# Patient Record
Sex: Male | Born: 1955
Health system: Southern US, Community
[De-identification: ages and names within clinical notes are randomized; demographics above are authoritative.]

## PROBLEM LIST (undated history)

## (undated) DIAGNOSIS — E039 Hypothyroidism, unspecified: Secondary | ICD-10-CM

## (undated) DIAGNOSIS — E785 Hyperlipidemia, unspecified: Secondary | ICD-10-CM

## (undated) DIAGNOSIS — I2510 Atherosclerotic heart disease of native coronary artery without angina pectoris: Secondary | ICD-10-CM

## (undated) DIAGNOSIS — C801 Malignant (primary) neoplasm, unspecified: Secondary | ICD-10-CM

## (undated) DIAGNOSIS — M1990 Unspecified osteoarthritis, unspecified site: Secondary | ICD-10-CM

## (undated) HISTORY — PX: OTHER SURGICAL HISTORY: SHX169

## (undated) HISTORY — DX: Hyperlipidemia, unspecified: E78.5

---

## 2013-07-16 ENCOUNTER — Encounter (HOSPITAL_COMMUNITY): Payer: Self-pay | Admitting: Emergency Medicine

## 2013-07-16 ENCOUNTER — Emergency Department (HOSPITAL_COMMUNITY): Payer: BC Managed Care – PPO

## 2013-07-16 ENCOUNTER — Emergency Department (HOSPITAL_COMMUNITY)
Admission: EM | Admit: 2013-07-16 | Discharge: 2013-07-16 | Disposition: A | Discharge status: Home / Self Care | Payer: BC Managed Care – PPO | Attending: Emergency Medicine | Admitting: Emergency Medicine

## 2013-07-16 DIAGNOSIS — F17200 Nicotine dependence, unspecified, uncomplicated: Secondary | ICD-10-CM | POA: Insufficient documentation

## 2013-07-16 DIAGNOSIS — G454 Transient global amnesia: Secondary | ICD-10-CM

## 2013-07-16 DIAGNOSIS — Z7982 Long term (current) use of aspirin: Secondary | ICD-10-CM | POA: Insufficient documentation

## 2013-07-16 LAB — COMPREHENSIVE METABOLIC PANEL
ALK PHOS: 105 U/L (ref 39–117)
ALT: 52 U/L (ref 0–53)
AST: 35 U/L (ref 0–37)
Albumin: 4.6 g/dL (ref 3.5–5.2)
BILIRUBIN TOTAL: 0.4 mg/dL (ref 0.3–1.2)
BUN: 11 mg/dL (ref 6–23)
CHLORIDE: 98 meq/L (ref 96–112)
CO2: 22 meq/L (ref 19–32)
Calcium: 9.6 mg/dL (ref 8.4–10.5)
Creatinine, Ser: 0.67 mg/dL (ref 0.50–1.35)
GLUCOSE: 104 mg/dL — AB (ref 70–99)
POTASSIUM: 4 meq/L (ref 3.7–5.3)
SODIUM: 136 meq/L — AB (ref 137–147)
Total Protein: 8.1 g/dL (ref 6.0–8.3)

## 2013-07-16 LAB — CBC WITH DIFFERENTIAL/PLATELET
BASOS ABS: 0 10*3/uL (ref 0.0–0.1)
Basophils Relative: 0 % (ref 0–1)
Eosinophils Absolute: 0 10*3/uL (ref 0.0–0.7)
Eosinophils Relative: 0 % (ref 0–5)
HCT: 41.7 % (ref 39.0–52.0)
Hemoglobin: 14 g/dL (ref 13.0–17.0)
LYMPHS PCT: 17 % (ref 12–46)
Lymphs Abs: 2.3 10*3/uL (ref 0.7–4.0)
MCH: 29 pg (ref 26.0–34.0)
MCHC: 33.6 g/dL (ref 30.0–36.0)
MCV: 86.3 fL (ref 78.0–100.0)
MONO ABS: 0.9 10*3/uL (ref 0.1–1.0)
Monocytes Relative: 7 % (ref 3–12)
NEUTROS ABS: 10.3 10*3/uL — AB (ref 1.7–7.7)
Neutrophils Relative %: 76 % (ref 43–77)
PLATELETS: 310 10*3/uL (ref 150–400)
RBC: 4.83 MIL/uL (ref 4.22–5.81)
RDW: 14.1 % (ref 11.5–15.5)
WBC: 13.6 10*3/uL — AB (ref 4.0–10.5)

## 2013-07-16 NOTE — ED Provider Notes (Signed)
CSN: [id number]     Arrival date & time 07/16/13  1954 History   First MD Initiated Contact with Patient 07/16/13 2106     Chief Complaint  Patient presents with  . Memory Loss      HPI Family reports that pt experienced loss of memory that started at 5pm.  This sudden loss of memory occurred immediately following sexual intercourse.  There was no seizure activity or urinary incontinence. Family denies any slurred speech, weakness, or balance deficits. Pt alert and oriented to person, place and situation with a slight deviation in not recalling the year. Pt denies headache, SOB or chest pain. Marland Kitchen       History reviewed. No pertinent past medical history. History reviewed. No pertinent past surgical history. History reviewed. No pertinent family history. History  Substance Use Topics  . Smoking status: Current Some Day Smoker  . Smokeless tobacco: Current User  . Alcohol Use: No    Review of Systems  All other systems reviewed and are negative  Allergies  Review of patient's allergies indicates no known allergies.  Home Medications   Prior to Admission medications   Medication Sig Start Date End Date Taking? Authorizing Provider  aspirin 325 MG tablet Take 325 mg by mouth once.   Yes Historical Provider, MD   BP 139/83  Pulse 109  Temp(Src) 99.2 F (37.3 C) (Oral)  Resp 18  Ht 5\' 10"  (1.778 m)  Wt 236 lb 3.2 oz (107.14 kg)  BMI 33.89 kg/m2  SpO2 100% Physical Exam  Nursing note and vitals reviewed. Constitutional: He is oriented to person, place, and time. He appears well-developed and well-nourished. No distress.  HENT:  Head: Normocephalic and atraumatic.  Eyes: Pupils are equal, round, and reactive to light.  Neck: Normal range of motion.  Cardiovascular: Normal rate and intact distal pulses.   Pulmonary/Chest: No respiratory distress.  Abdominal: Normal appearance. He exhibits no distension.  Musculoskeletal: Normal range of motion.  Neurological: He is  alert and oriented to person, place, and time. He has normal strength. No cranial nerve deficit or sensory deficit. Coordination abnormal. GCS eye subscore is 4. GCS motor subscore is 6.  Patient methodically repeats statements and questions with identical gestures.  Skin: Skin is warm and dry. No rash noted.  Psychiatric: He has a normal mood and affect. His behavior is normal.    ED Course  Procedures (including critical care time) Labs Review Labs Reviewed  COMPREHENSIVE METABOLIC PANEL - Abnormal; Notable for the following:    Sodium 136 (*)    Glucose, Bld 104 (*)    All other components within normal limits  CBC WITH DIFFERENTIAL - Abnormal; Notable for the following:    WBC 13.6 (*)    Neutro Abs 10.3 (*)    All other components within normal limits    Imaging Review No results found.    MDM   Final diagnoses:  TGA (transient global amnesia)       Dot Lanes, MD 07/23/13 [phone]

## 2013-07-16 NOTE — ED Notes (Signed)
Family reports that pt experienced loss of memory that started at [place] denies any slurred speech, weakness, or balance deficits. Pt alert and oriented to person, place and situation with a slight deviation in not recalling the year. Pt denies headache, SOB or chest pain. Family reports a new e cigarette use for pt.

## 2013-07-16 NOTE — ED Notes (Signed)
MD at bedside. Dr. Audie Pinto at bedside.

## 2013-07-16 NOTE — ED Notes (Signed)
Pt is alert to person, place and self, but is unsure of what year it is or what day it is.

## 2013-07-16 NOTE — Discharge Instructions (Signed)
Transient Global Amnesia Your exam shows you may have a rare problem that causes temporary amnesia, an inability to remember what has happened in the past several hours or day. Transient global amnesia (TGA) means you cannot remember recent events, even though you may look and act normally. There are no physical problems in TGA; your vision, strength, coordination, and sensations are all normal. There is usually a complete return to normal memory capacity after an episode is over. Avoid alcohol or any sedating medicines until you are completely recovered. Call your doctor right away if your memory is not fully recovered after 24 hours, or if you have any other serious problems including:  Severe headache, nausea, vomiting, fever, or other symptoms of an infection.  Weakness, numbness, difficulty with movement, or incoordination.  Blurred or double vision, unusual sleepiness, seizures, or fainting. Document Released: 04/26/2004 Document Revised: 06/11/2011 Document Reviewed: 03/19/2005 [facility] Patient Information 2014 [place].

## 2018-04-10 DIAGNOSIS — Z0000 Encounter for general adult medical examination without abnormal findings: Secondary | ICD-10-CM | POA: Diagnosis not present

## 2018-04-10 DIAGNOSIS — Z23 Encounter for immunization: Secondary | ICD-10-CM | POA: Diagnosis not present

## 2018-04-10 DIAGNOSIS — Z125 Encounter for screening for malignant neoplasm of prostate: Secondary | ICD-10-CM | POA: Diagnosis not present

## 2018-04-10 DIAGNOSIS — Z13220 Encounter for screening for lipoid disorders: Secondary | ICD-10-CM | POA: Diagnosis not present

## 2018-04-10 DIAGNOSIS — Z720 Tobacco use: Secondary | ICD-10-CM | POA: Diagnosis not present

## 2018-04-16 ENCOUNTER — Ambulatory Visit (INDEPENDENT_AMBULATORY_CARE_PROVIDER_SITE_OTHER): Payer: 59 | Admitting: Orthopaedic Surgery

## 2018-04-16 ENCOUNTER — Ambulatory Visit (INDEPENDENT_AMBULATORY_CARE_PROVIDER_SITE_OTHER): Payer: Self-pay

## 2018-04-16 ENCOUNTER — Encounter (INDEPENDENT_AMBULATORY_CARE_PROVIDER_SITE_OTHER): Payer: Self-pay | Admitting: Orthopaedic Surgery

## 2018-04-16 DIAGNOSIS — M25552 Pain in left hip: Secondary | ICD-10-CM

## 2018-04-16 NOTE — Progress Notes (Signed)
+   Office Visit Note   Patient: Marc [name]           Date of Birth: 1955/12/04           MRN: [id number] Visit Date: 04/16/2018              Requested by: [name], PA-C [address] [place], [place] [address] PCP: System, Pcp Not In   Assessment & Plan: Visit Diagnoses:  1. Pain in left hip     Plan: This certainly seems to be a hip issue.  I would like to send him to Dr. Ernestina Patches for an intra-articular steroid injection of the left hip to see how this helps from a symptomatic standpoint.  We will also send him to physical therapy to work on stretching of the hip and strengthening to hopefully improve his mobility and his range of motion.  I will see him back in 4 weeks to see how this is done for him.  All question concerns were answered and addressed.  Follow-Up Instructions: Return in about 4 weeks (around 05/14/2018).   Orders:  Orders Placed This Encounter  Procedures  . XR HIP UNILAT W OR W/O PELVIS 1V LEFT   No orders of the defined types were placed in this encounter.     Procedures: No procedures performed   Clinical Data: No additional findings.   Subjective: Chief Complaint  Patient presents with  . Left Hip - Pain  The patient is a very pleasant and active 63 year old gentleman who is been having left hip and groin pain since the summer of this past year.  He denies any injury.  His left hip is gotten significantly stiff and it is difficult with him crossing his left leg over his right and putting his shoes and socks on the left side.  He denies any sciatic symptoms or any back issues.  He denies any numbness and tingling down his legs.  He denies any knee issues.  He denies any recent injuries.  It has been definitely affecting his mobility and his quality of life.  HPI  Review of Systems He currently denies any headache, chest pain, shortness of breath, fever, chills, nausea, vomiting.  Objective: Vital Signs: There were no vitals  taken for this visit.  Physical Exam He is alert and orient x3 and in no acute distress Ortho Exam Examination of his right hip is entirely normal examination of his left hip shows significant stiffness with internal and external rotation.  There is pain on rotation in the groin as well on the left hip.  There is no pain to palpation of the trochanteric area.  He has a negative straight leg raise.  He has excellent strength in his bilateral lower extremities.  He is actually slightly longer minimally but is longer on his left leg than his right side. Specialty Comments:  No specialty comments available.  Imaging: Xr Hip Unilat W Or W/o Pelvis 1v Left  Result Date: 04/16/2018 An AP pelvis and lateral left hip shows para-articular osteophytes and slight joint space narrowing as well as sclerotic changes.  This is mainly in the femoral head.    PMFS History: There are no active problems to display for this patient.  History reviewed. No pertinent past medical history.  History reviewed. No pertinent family history.  History reviewed. No pertinent surgical history. Social History   Occupational History  . Not on file  Tobacco Use  . Smoking status: Current Some  Day Smoker  . Smokeless tobacco: Current User  Substance and Sexual Activity  . Alcohol use: No  . Drug use: No  . Sexual activity: Not on file

## 2018-04-17 ENCOUNTER — Other Ambulatory Visit (INDEPENDENT_AMBULATORY_CARE_PROVIDER_SITE_OTHER): Payer: Self-pay

## 2018-04-17 DIAGNOSIS — M25552 Pain in left hip: Secondary | ICD-10-CM

## 2018-04-29 ENCOUNTER — Ambulatory Visit (INDEPENDENT_AMBULATORY_CARE_PROVIDER_SITE_OTHER): Payer: 59 | Admitting: Physical Medicine and Rehabilitation

## 2018-04-29 ENCOUNTER — Encounter (INDEPENDENT_AMBULATORY_CARE_PROVIDER_SITE_OTHER): Payer: Self-pay | Admitting: Physical Medicine and Rehabilitation

## 2018-04-29 ENCOUNTER — Ambulatory Visit (INDEPENDENT_AMBULATORY_CARE_PROVIDER_SITE_OTHER): Payer: Self-pay

## 2018-04-29 DIAGNOSIS — M25552 Pain in left hip: Secondary | ICD-10-CM | POA: Diagnosis not present

## 2018-04-29 NOTE — Progress Notes (Signed)
 .  Numeric Pain Rating Scale and Functional Assessment Average Pain 7   In the last MONTH (on 0-10 scale) has pain interfered with the following?  1. General activity like being  able to carry out your everyday physical activities such as walking, climbing stairs, carrying groceries, or moving a chair?  Rating(6)   -Dye Allergies.  

## 2018-04-29 NOTE — Patient Instructions (Signed)

## 2018-04-29 NOTE — Progress Notes (Signed)
   Marc [name] - 63 y.o. male MRN [id number]  Date of birth: 01/02/1956  Office Visit Note: Visit Date: 04/29/2018 PCP: System, Pcp Not In Referred by: Mcarthur Rossetti*  Subjective: No chief complaint on file.  HPI:  Marc [name] is a 63 y.o. male who comes in today At the request of Dr. Jean Rosenthal for diagnostic note for therapeutic anesthetic hip arthrogram on the left.  Patient is having left hip and groin pain started 6 to 8 months ago worse with walking activity.  Average pain is 7 out of 10.  ROS Otherwise per HPI.  Assessment & Plan: Visit Diagnoses:  1. Pain in left hip     Plan: Findings:  Diagnostic and therapeutic anesthetic hip arthrogram on the left.  Patient did have relief during the anesthetic phase.    Meds & Orders: No orders of the defined types were placed in this encounter.   Orders Placed This Encounter  Procedures  . Large Joint Inj: L hip joint  . XR C-ARM NO REPORT    Follow-up: Return for Jean Rosenthal, MD.   Procedures: Large Joint Inj: L hip joint on 04/29/2018 8:32 AM Indications: pain and diagnostic evaluation Details: 22 G needle, anterior approach  Arthrogram: Yes  Medications: 80 mg triamcinolone acetonide 40 MG/ML; 3 mL bupivacaine 0.5 % Outcome: tolerated well, no immediate complications  Arthrogram demonstrated excellent flow of contrast throughout the joint surface without extravasation or obvious defect.  The patient had relief of symptoms during the anesthetic phase of the injection.  Procedure, treatment alternatives, risks and benefits explained, specific risks discussed. Consent was given by the patient. Immediately prior to procedure a time out was called to verify the correct patient, procedure, equipment, support staff and site/side marked as required. Patient was prepped and draped in the usual sterile fashion.      No notes on file   Clinical History: No specialty comments available.      Objective:  VS:  HT:    WT:   BMI:     BP:   HR: bpm  TEMP: ( )  RESP:  Physical Exam  Ortho Exam Imaging: Xr C-arm No Report  Result Date: 04/29/2018 Please see Notes tab for imaging impression.

## 2018-04-30 MED ORDER — TRIAMCINOLONE ACETONIDE 40 MG/ML IJ SUSP
80.0000 mg | INTRAMUSCULAR | Status: AC | PRN
  Administered 2018-04-29: 80 mg via INTRA_ARTICULAR

## 2018-04-30 MED ORDER — BUPIVACAINE HCL 0.5 % IJ SOLN
3.0000 mL | INTRAMUSCULAR | Status: AC | PRN
  Administered 2018-04-29: 3 mL via INTRA_ARTICULAR

## 2018-05-05 ENCOUNTER — Encounter: Payer: Self-pay | Admitting: Physical Therapy

## 2018-05-05 ENCOUNTER — Ambulatory Visit: Payer: 59 | Attending: Orthopaedic Surgery | Admitting: Physical Therapy

## 2018-05-05 ENCOUNTER — Other Ambulatory Visit: Payer: Self-pay

## 2018-05-05 DIAGNOSIS — M25552 Pain in left hip: Secondary | ICD-10-CM | POA: Insufficient documentation

## 2018-05-05 DIAGNOSIS — R262 Difficulty in walking, not elsewhere classified: Secondary | ICD-10-CM | POA: Diagnosis not present

## 2018-05-05 DIAGNOSIS — M25652 Stiffness of left hip, not elsewhere classified: Secondary | ICD-10-CM | POA: Diagnosis not present

## 2018-05-05 NOTE — Therapy (Signed)
[place], [place], [address] Phone: [phone]   Fax:  [phone]  Physical Therapy Evaluation  Patient Details  Name: Marc [name] MRN: [id number] Date of Birth: Dec 17, 1955 Referring Provider (PT): Jean Rosenthal, MD   Encounter Date: 05/05/2018  PT End of Session - 05/05/18 1225    Visit Number  1    Number of Visits  12    Date for PT Re-Evaluation  06/16/18    Authorization Type  [employer]-23 visit limit    PT Start Time  1059    PT Stop Time  1146    PT Time Calculation (min)  47 min    Activity Tolerance  Patient tolerated treatment well    Behavior During Therapy  [facility] for tasks assessed/performed       Past Medical History:  Diagnosis Date  . Hyperlipidemia     Past Surgical History:  Procedure Laterality Date  . right shoulder rotator cuff repair Right     There were no vitals filed for this visit.   Subjective Assessment - 05/05/18 1103    Subjective  Pt. is a 63 y/o male referred to PT with c/o left hip pain and stiffness. Pt. reports onset early last Summer. No mechanism of injury noted. He reports in the past had had issues in the Summer with muscles "tightening up" in hip and calf which he attributed to deydration but reports this time hip pain and tightness persisted. Pt. had X-rays which per MD notes showed mild joint space narrowing as well as osteophytes, sclerotic changes. Pt. had therapeutc anesthetic left hip arthrogram 04/29/18 which hew reports has helped with pain. Of note at MD visit pt. was also noted to have leg length discrepancy with left LE>right but reports he feels like right LE is longer.    Pertinent History  possible leg length discrepancy, no other significant related history    Limitations  Standing;Walking;House hold activities   walking uphill is worst   How long can you sit comfortably?  no limitations    How long can you stand comfortably?  no limitations     How long can you walk comfortably?  varies, reports at PLOF able up to an hour but has not attempted given pain    Diagnostic tests  X-rays    Patient Stated Goals  Improve flexibility in hip    Currently in Pain?  Yes    Pain Score  2     Pain Location  Hip    Pain Orientation  Left;Anterior   left groin and anterior hip   Pain Descriptors / Indicators  Constant    Pain Type  Chronic pain    Pain Radiating Towards  groin, left anterior hip    Pain Onset  More than a month ago    Pain Frequency  Constant    Aggravating Factors   walking (especially uphill) and standing    Pain Relieving Factors  medication, recent injection    Effect of Pain on Daily Activities  Limits tolerance community mobility, prolonged ambulation and standing         [facility] PT Assessment - 05/05/18 0001      Assessment   Medical Diagnosis  Left hip pain    Referring Provider (PT)  Jean Rosenthal, MD    Onset Date/Surgical Date  08/31/17    Prior Therapy  none      Precautions   Precautions  None  Restrictions   Weight Bearing Restrictions  No      Balance Screen   Has the patient fallen in the past 6 months  No      [place] residence    Living Arrangements  Spouse/significant other    Type of [place] Access  --   3-4 steps to enter, steps inside to basement and attic     Prior Function   Level of Independence  Independent with basic ADLs;Independent with community mobility without device      Cognition   Overall Cognitive Status  Within Functional Limits for tasks assessed      Observation/Other Assessments   Observations  --   LLD with left LE 2 cm > right   Focus on Therapeutic Outcomes (FOTO)   42% Limited      ROM / Strength   AROM / PROM / Strength  AROM;PROM;Strength      AROM   AROM Assessment Site  Hip    Right/Left Hip  Right;Left    Right Hip Flexion  105    Right Hip External Rotation   40    Right Hip Internal  Rotation   25    Right Hip ABduction  45    Right Hip ADduction  --   WFL   Left Hip Flexion  95    Left Hip External Rotation   38    Left Hip Internal Rotation   20    Left Hip ABduction  40    Left Hip ADduction  --   WFL     PROM   Overall PROM Comments  --   capsular tightness at end-ranges left hip PROM   PROM Assessment Site  Hip    Right/Left Hip  Left    Left Hip Flexion  100    Left Hip External Rotation   40    Left Hip Internal Rotation   20    Left Hip ABduction  40      Strength   Strength Assessment Site  Hip;Knee    Right/Left Hip  Right;Left    Right Hip Flexion  5/5    Right Hip Extension  5/5    Right Hip External Rotation   5/5    Right Hip Internal Rotation  5/5    Right Hip ABduction  5/5    Left Hip Flexion  4+/5    Left Hip Extension  4+/5    Left Hip External Rotation  4+/5    Left Hip Internal Rotation  4+/5    Left Hip ABduction  5/5    Right/Left Knee  Right;Left    Right Knee Flexion  5/5    Right Knee Extension  5/5    Left Knee Flexion  5/5    Left Knee Extension  5/5      Flexibility   Soft Tissue Assessment /Muscle Length  --   tight adductors,hip flexors,hamstrings SLR 70 deg,piriformis     Palpation   Palpation comment  possible left anterior innominate rotation      Special Tests   Other special tests  Scour (-), No pain with FABER but limited ability due to stiffness, muscle tightness in adductors, no significant change in LLD with [place] + longsitting                Objective measurements completed on examination: See above findings.      [facility]  Adult PT Treatment/Exercise - 05/05/18 0001      Exercises   Exercises  Knee/Hip      Knee/Hip Exercises: Stretches   Hip Flexor Stretch  --   instructed HEP supine stretch   Piriformis Stretch Limitations  attempted for HEP but difficulty feeling stretch, more note of groin tightness per subjective report, limited ability to stretch at eval due to hip stiffness     Other Knee/Hip Stretches  instructed supine adductor stretch      Knee/Hip Exercises: Supine   Straight Leg Raises  AROM;Strengthening;Left;5 reps    Straight Leg Raises Limitations  brief practice for HEP    Other Supine Knee/Hip Exercises  clamshell green band x 10      Manual Therapy   Manual Therapy  Joint mobilization;Muscle Energy Technique    Joint Mobilization  Left hip mobilization at 90 deg flexion for increased ER, IR, flexion with use belt grade I-IV, left hip long axis distraction grade I-IV    Muscle Energy Technique  Left hamstring/right hip flexor contraction 2 sets of 5 for 5 sec holds for left anterior innominate rotation             PT Education - 05/05/18 1224    Education Details  HEP, POC, potential symptom etiology    Person(s) Educated  Patient    Methods  Explanation;Demonstration;Verbal cues;Handout    Comprehension  Verbalized understanding;Returned demonstration;Verbal cues required          PT Long Term Goals - 05/05/18 1245      PT LONG TERM GOAL #1   Title  Independent with HEP    Baseline  no HEP    Time  6    Period  Weeks    Status  New    Target Date  06/16/18      PT LONG TERM GOAL #2   Title  Improve FOTO score to 32% or less impairment    Baseline  42% limited    Time  6    Period  Weeks    Status  New    Target Date  06/16/18      PT LONG TERM GOAL #3   Title  Increase left hip AROM 10 deg or greater to improve ability to donn shoes and perform transfers from low seating    Baseline  95 deg    Time  6    Period  Weeks    Status  New    Target Date  06/16/18      PT LONG TERM GOAL #4   Title  Left hip strength grossly 5/5 to improve stability for prolonged ambulation/gait over uneven surfaces and squatting motions for IADLs    Baseline  see flowsheet-grossly 4+/5    Time  6    Period  Weeks    Status  New    Target Date  06/16/18             Plan - 05/05/18 1226    Clinical Impression Statement  Pt.  presents with left hip pain and decreased ROM, muscle weakness with capsular restriction of ROM along with muscle tightness. Pt. also presents with left length discrepancy (left>right) which unclear if contributing to symptoms, also possible associated innominate rotation. Pt. would benefit from PT to address current associated functional limitations for mobility.    History and Personal Factors relevant to plan of care:  duration of symptoms, etiology somewhat unclear given degenerative changes mild per imaging report  Clinical Presentation  Stable    Clinical Decision Making  Low    Rehab Potential  Good    Clinical Impairments Affecting Rehab Potential  expect good potential to improve hip mobility, flexibility and strength    PT Frequency  2x / week    PT Duration  6 weeks    PT Treatment/Interventions  Dry needling;Manual techniques;Joint Manipulations;Taping;ADLs/Self Care Home Management;Cryotherapy;Ultrasound;Moist Heat;Iontophoresis 4mg /ml Dexamethasone;Electrical Stimulation;Therapeutic activities;Therapeutic exercise;Neuromuscular re-education;Patient/family education;Passive range of motion;Gait training    PT Next Visit Plan  NUSTEP warm up, joint mobilization left hip use belt as needed, roller use left adductors, piriformis, left hip stretches-adductors, hip flexors, try piriformis (limited ability to stretch at eval due to hip stiffness and groin tightness), hip strengthening as tolerated, possible METs for innominate rotation if needed, discuss lift for shoe for LLD as needed    PT Home Exercise Plan  stretches: adductors and hip flexors (will add piriformis/external rotators as able), clamshells, SLR    Consulted and Agree with Plan of Care  Patient       Patient will benefit from skilled therapeutic intervention in order to improve the following deficits and impairments:  Pain, Hypomobility, Decreased strength, Decreased range of motion, Decreased activity tolerance, Impaired  flexibility, Difficulty walking  Visit Diagnosis: Pain in left hip  Stiffness of left hip, not elsewhere classified  Difficulty in walking, not elsewhere classified     Problem List There are no active problems to display for this patient.   Beaulah Dinning, PT, DPT 05/05/18 1:00 PM  [facility] [address] [place], [place], [address] Phone: [phone]   Fax:  [phone]  Name: Marc [name] MRN: [id number] Date of Birth: 1956-01-28

## 2018-05-13 ENCOUNTER — Encounter: Payer: Self-pay | Admitting: Physical Therapy

## 2018-05-13 ENCOUNTER — Ambulatory Visit: Payer: 59 | Admitting: Physical Therapy

## 2018-05-13 DIAGNOSIS — R262 Difficulty in walking, not elsewhere classified: Secondary | ICD-10-CM

## 2018-05-13 DIAGNOSIS — M25552 Pain in left hip: Secondary | ICD-10-CM | POA: Diagnosis not present

## 2018-05-13 DIAGNOSIS — M25652 Stiffness of left hip, not elsewhere classified: Secondary | ICD-10-CM

## 2018-05-13 NOTE — Therapy (Signed)
[place], [place], [address] Phone: [phone]   Fax:  [phone]  Physical Therapy Treatment  Patient Details  Name: Marc [name] MRN: [id number] Date of Birth: [birth year] Referring Provider (PT): Jean Rosenthal, MD   Encounter Date: 05/13/2018  PT End of Session - 05/13/18 1215    Visit Number  2    Number of Visits  12    Date for PT Re-Evaluation  06/16/18    Authorization Type  UHC-23 visit limit    PT Start Time  1017    PT Stop Time  1055    PT Time Calculation (min)  38 min    Activity Tolerance  Patient tolerated treatment well    Behavior During Therapy  [facility] for tasks assessed/performed       Past Medical History:  Diagnosis Date  . Hyperlipidemia     Past Surgical History:  Procedure Laterality Date  . right shoulder rotator cuff repair Right     There were no vitals filed for this visit.  Subjective Assessment - 05/13/18 1024    Subjective  "Hanging the leg off the bed really helped a lot with my hip pain and all of the exercises are helping. I got a shot that helped a lot also."    Currently in Pain?  No/denies                       [facility] Adult PT Treatment/Exercise - 05/13/18 0001      Knee/Hip Exercises: Stretches   Active Hamstring Stretch  2 reps;30 seconds    Quad Stretch  Left;2 reps;30 seconds    Piriformis Stretch  Left;2 reps;30 seconds    Piriformis Stretch Limitations  Pt noted pain along anterior aspect of hip near the groin    Other Knee/Hip Stretches  Adductor stretch x2; 30 sec      Knee/Hip Exercises: Aerobic   Nustep  L4 5 min      Knee/Hip Exercises: Standing   Other Standing Knee Exercises  3-way hip x10 each side   Cues for slow movement with control and upright posture     Knee/Hip Exercises: Seated   Long Arc Quad  Strengthening;Left;2 sets;10 reps   Strengthen quads    Long Arc Quad Limitations  Cues for eccentric control       Knee/Hip Exercises: Supine   Other Supine Knee/Hip Exercises  clamshell green band 2 x 10    Other Supine Knee/Hip Exercises  ball squeezes x10; hold 3-5 secs      Manual Therapy   Manual Therapy  Joint mobilization;Muscle Energy Technique    Joint Mobilization  Distraction x3 30 sec holds; posterolateral glide to stretch posterior capsule    Muscle Energy Technique  Left hamstring/right hip flexor contraction 2 sets of 5 for 5 sec holds for left anterior innominate rotation             PT Education - 05/13/18 1215    Education Details  HEP    Person(s) Educated  Patient    Methods  Explanation;Demonstration;Verbal cues    Comprehension  Verbalized understanding;Returned demonstration          PT Long Term Goals - 05/05/18 1245      PT LONG TERM GOAL #1   Title  Independent with HEP    Baseline  no HEP    Time  6    Period  Weeks    Status  New    Target Date  06/16/18      PT LONG TERM GOAL #2   Title  Improve FOTO score to 32% or less impairment    Baseline  42% limited    Time  6    Period  Weeks    Status  New    Target Date  06/16/18      PT LONG TERM GOAL #3   Title  Increase left hip AROM 10 deg or greater to improve ability to donn shoes and perform transfers from low seating    Baseline  95 deg    Time  6    Period  Weeks    Status  New    Target Date  06/16/18      PT LONG TERM GOAL #4   Title  Left hip strength grossly 5/5 to improve stability for prolonged ambulation/gait over uneven surfaces and squatting motions for IADLs    Baseline  see flowsheet-grossly 4+/5    Time  6    Period  Weeks    Status  New    Target Date  06/16/18     During this treatment session, the therapist was present, participating in and directing the treatment.       Plan - 05/13/18 1345    Clinical Impression Statement  Pt reports feeling much better after doing his stretches and exercises at home. Updated HEP with standing hip 3-way. Performed joint  mobilizations to improve ROM and decrease pain in anterior hip. Pt required cues for slow eccentric control during ther ex, but demos good recall of HEP.     PT Next Visit Plan  NUSTEP warm up, joint mobilization left hip use belt as needed, roller use left adductors, discuss lift for shoe for LLD as needed, assess if hips are level and focus on ROM    PT Home Exercise Plan  stretches: adductors and hip flexors (will add piriformis/external rotators as able), clamshells, SLR,        Patient will benefit from skilled therapeutic intervention in order to improve the following deficits and impairments:  Pain, Hypomobility, Decreased strength, Decreased range of motion, Decreased activity tolerance, Impaired flexibility, Difficulty walking  Visit Diagnosis: Pain in left hip  Stiffness of left hip, not elsewhere classified  Difficulty in walking, not elsewhere classified     Problem List There are no active problems to display for this patient.   Fuller Mandril, SPTA 05/13/2018, 2:49 PM   Hessie Diener, PTA 05/13/18 2:49 PM Phone: [phone] Fax: [facility] [address] [place], [place], [address] Phone: [phone]   Fax:  [phone]  Name: Marc [name] MRN: [id number] Date of Birth: June 02, 1955

## 2018-05-14 ENCOUNTER — Encounter (INDEPENDENT_AMBULATORY_CARE_PROVIDER_SITE_OTHER): Payer: Self-pay | Admitting: Orthopaedic Surgery

## 2018-05-14 ENCOUNTER — Ambulatory Visit (INDEPENDENT_AMBULATORY_CARE_PROVIDER_SITE_OTHER): Payer: 59 | Admitting: Orthopaedic Surgery

## 2018-05-14 DIAGNOSIS — M25552 Pain in left hip: Secondary | ICD-10-CM | POA: Diagnosis not present

## 2018-05-14 NOTE — Progress Notes (Signed)
HPI: Marc [name] returns today follow-up of his left hip.  He is status post intra-articular injection by Dr. Ernestina Patches on 04/29/2018.  He states the injection was very helpful.  He is 95% better.  More his stiffness and tightness in the hip at this point.  He states the pain is having now he can definitely live with.  Physical exam: General: Well-developed well-nourished male no acute distress mood affect appropriate  Bilateral hips: He has limited internal and external rotation of the left hip compared to full range of motion of the right hip.  No significant pain with range of motion of either hip.  Impression: Left hip pain  Plan: He will follow-up on an as-needed basis.  I did discuss with him would not recommend injections in the hip more often than every 6 months.  He has increasing pain he will return for follow-up.  He is going to finish up with physical therapy to work on range of motion strengthening of the left hip.  Questions encouraged and answered at length

## 2018-05-15 ENCOUNTER — Ambulatory Visit: Payer: 59 | Admitting: Physical Therapy

## 2018-05-15 DIAGNOSIS — R262 Difficulty in walking, not elsewhere classified: Secondary | ICD-10-CM

## 2018-05-15 DIAGNOSIS — M25652 Stiffness of left hip, not elsewhere classified: Secondary | ICD-10-CM

## 2018-05-15 DIAGNOSIS — M25552 Pain in left hip: Secondary | ICD-10-CM

## 2018-05-15 NOTE — Therapy (Signed)
[place], [place], [address] Phone: [phone]   Fax:  [phone]  Physical Therapy Treatment  Patient Details  Name: Marc [name] MRN: [id number] Date of Birth: [birth year] Referring Provider (PT): Jean Rosenthal, MD   Encounter Date: 05/15/2018  PT End of Session - 05/15/18 1156    Visit Number  3    Number of Visits  12    Date for PT Re-Evaluation  06/16/18    Authorization Type  UHC-23 visit limit    PT Start Time  1100    PT Stop Time  1146    PT Time Calculation (min)  46 min    Activity Tolerance  Patient tolerated treatment well    Behavior During Therapy  [facility] for tasks assessed/performed       Past Medical History:  Diagnosis Date  . Hyperlipidemia     Past Surgical History:  Procedure Laterality Date  . right shoulder rotator cuff repair Right     There were no vitals filed for this visit.  Subjective Assessment - 05/15/18 1101    Subjective  No pain pre-tx. Notes mild benefit from therapy so far but still early in therapy so hard to tell too much.    Currently in Pain?  No/denies    Pain Score  0-No pain                       [facility] Adult PT Treatment/Exercise - 05/15/18 0001      Knee/Hip Exercises: Stretches   Active Hamstring Stretch  Left;3 reps;30 seconds    Hip Flexor Stretch  --   supine at edge of mat 3x30 sec   Piriformis Stretch  Left;3 reps;30 seconds    Other Knee/Hip Stretches  adductor stretch 3x30 sec    Other Knee/Hip Stretches  Left IT band stretch 3x30 sec      Knee/Hip Exercises: Aerobic   Nustep  L5 x 5 min      Knee/Hip Exercises: Standing   Other Standing Knee Exercises  standing hip 3-way SLR with red theraband 2x10 ea.      Knee/Hip Exercises: Supine   Bridges with Greig Right  Both;20 reps      Manual Therapy   Manual Therapy  Soft tissue mobilization    Joint Mobilization  Left hip long axis distraction grade I-III,  claudal glides and ER, IR mobs grade I-IV with belt    Soft tissue mobilization  STM + IASTM with roller use left adductors             PT Education - 05/15/18 1156    Education Details  POC    Person(s) Educated  Patient    Methods  Explanation    Comprehension  Verbalized understanding          PT Long Term Goals - 05/05/18 1245      PT LONG TERM GOAL #1   Title  Independent with HEP    Baseline  no HEP    Time  6    Period  Weeks    Status  New    Target Date  06/16/18      PT LONG TERM GOAL #2   Title  Improve FOTO score to 32% or less impairment    Baseline  42% limited    Time  6    Period  Weeks    Status  New    Target Date  06/16/18  PT LONG TERM GOAL #3   Title  Increase left hip AROM 10 deg or greater to improve ability to donn shoes and perform transfers from low seating    Baseline  95 deg    Time  6    Period  Weeks    Status  New    Target Date  06/16/18      PT LONG TERM GOAL #4   Title  Left hip strength grossly 5/5 to improve stability for prolonged ambulation/gait over uneven surfaces and squatting motions for IADLs    Baseline  see flowsheet-grossly 4+/5    Time  6    Period  Weeks    Status  New    Target Date  06/16/18            Plan - 05/15/18 1156    Clinical Impression Statement  Mild improvement thus far with tx.-extensive manual focus with joint mobs and STM to address hip ROM + soft tissue restriction aloing with stretches to address muscle tightness. Etiology still somewhat unclear of symptoms given imaging findings.    Clinical Impairments Affecting Rehab Potential  expect good potential to improve hip mobility, flexibility and strength    PT Frequency  2x / week    PT Duration  6 weeks    PT Treatment/Interventions  Dry needling;Manual techniques;Joint Manipulations;Taping;ADLs/Self Care Home Management;Cryotherapy;Ultrasound;Moist Heat;Iontophoresis 4mg /ml Dexamethasone;Electrical Stimulation;Therapeutic  activities;Therapeutic exercise;Neuromuscular re-education;Patient/family education;Passive range of motion;Gait training    PT Next Visit Plan  NUSTEP warm up, joint mobilization left hip use belt as needed, roller use left adductors, discuss lift for shoe for LLD as needed, assess if hips are level and focus on ROM    PT Home Exercise Plan  stretches: adductors and hip flexors (will add piriformis/external rotators as able), clamshells, SLR,     Consulted and Agree with Plan of Care  Patient       Patient will benefit from skilled therapeutic intervention in order to improve the following deficits and impairments:  Pain, Hypomobility, Decreased strength, Decreased range of motion, Decreased activity tolerance, Impaired flexibility, Difficulty walking  Visit Diagnosis: Pain in left hip  Stiffness of left hip, not elsewhere classified  Difficulty in walking, not elsewhere classified     Problem List There are no active problems to display for this patient.   Beaulah Dinning, PT, DPT 05/15/18 11:58 AM  [facility] [address] [place], [place], [address] Phone: [phone]   Fax:  [phone]  Name: Martinez Boxx MRN: [id number] Date of Birth: [birth year]

## 2018-05-20 ENCOUNTER — Ambulatory Visit: Payer: 59 | Admitting: Physical Therapy

## 2018-05-20 ENCOUNTER — Encounter: Payer: Self-pay | Admitting: Physical Therapy

## 2018-05-20 DIAGNOSIS — R262 Difficulty in walking, not elsewhere classified: Secondary | ICD-10-CM

## 2018-05-20 DIAGNOSIS — M25652 Stiffness of left hip, not elsewhere classified: Secondary | ICD-10-CM

## 2018-05-20 DIAGNOSIS — M25552 Pain in left hip: Secondary | ICD-10-CM

## 2018-05-20 NOTE — Therapy (Signed)
[place], [place], [address] Phone: [phone]   Fax:  [phone]  Physical Therapy Treatment  Patient Details  Name: Marc [name] MRN: [id number] Date of Birth: 1955-08-13 Referring Provider (PT): Jean Rosenthal, MD   Encounter Date: 05/20/2018  PT End of Session - 05/20/18 1148    Visit Number  4    Number of Visits  12    Date for PT Re-Evaluation  06/16/18    Authorization Type  UHC-23 visit limit    PT Start Time  1058    PT Stop Time  1140    PT Time Calculation (min)  42 min    Activity Tolerance  Patient tolerated treatment well    Behavior During Therapy  [facility] for tasks assessed/performed       Past Medical History:  Diagnosis Date  . Hyperlipidemia     Past Surgical History:  Procedure Laterality Date  . right shoulder rotator cuff repair Right     There were no vitals filed for this visit.  Subjective Assessment - 05/20/18 1101    Subjective  Some soreness noted the night of last tx. but then felt some relief/tx. helped. Still noting intermittent "twinges" of pain if "turning the wrong way."    Currently in Pain?  Yes    Pain Score  1     Pain Location  Hip    Pain Orientation  Left;Anterior    Pain Descriptors / Indicators  --   "twinge"   Pain Type  Chronic pain    Pain Onset  More than a month ago    Pain Frequency  Intermittent    Aggravating Factors   turning motions, intermittently with walking    Pain Relieving Factors  medication, recent injection    Effect of Pain on Daily Activities  Limits tolerance community ambulation, standing activities, IADLs         [facility] PT Assessment - 05/20/18 0001      AROM   Left Hip Flexion  100    Left Hip External Rotation   50    Left Hip Internal Rotation   20    Left Hip ABduction  60                   [facility] Adult PT Treatment/Exercise - 05/20/18 0001      Knee/Hip Exercises: Stretches   Passive  Hamstring Stretch  Left;3 reps;30 seconds    Piriformis Stretch  Left;3 reps;30 seconds    Other Knee/Hip Stretches  left adductor manual stretch 3x30 sec      Knee/Hip Exercises: Aerobic   Nustep  L5 x 5 min      Knee/Hip Exercises: Standing   Other Standing Knee Exercises  standoing hip flex and abd SLRs 2x10 with green band done bilat.      Knee/Hip Exercises: Supine   Bridges with Ball Squeeze  Both;2 sets;10 reps    Other Supine Knee/Hip Exercises  alt. unilat. clamshell with green band x 20 ea. bilat.      Manual Therapy   Joint Mobilization  Left hip long axis distraction grade I-IV, left hip caudal glides and ER, IR mobs grade I-IV with distraction using belt             PT Education - 05/20/18 1147    Education Details  POC, ROM progress, anatomy/etiology capsular restriction of hip motion    Person(s) Educated  Patient    Methods  Explanation    Comprehension  Verbalized understanding          PT Long Term Goals - 05/20/18 1158      PT LONG TERM GOAL #1   Title  Independent with HEP    Baseline  instructed at eval, will update prn    Time  6    Period  Weeks    Status  On-going      PT LONG TERM GOAL #2   Title  Improve FOTO score to 32% or less impairment    Baseline  42% limited at eval, not retested    Time  6    Period  Weeks    Status  On-going      PT LONG TERM GOAL #3   Title  Increase left hip flexion AROM 10 deg or greater to improve ability to donn shoes and perform transfers from low seating    Baseline  100 deg    Time  6    Period  Weeks    Status  On-going      PT LONG TERM GOAL #4   Title  Left hip strength grossly 5/5 to improve stability for prolonged ambulation/gait over uneven surfaces and squatting motions for IADLs    Baseline  MMTs not retested today    Time  6    Period  Weeks    Status  On-going            Plan - 05/20/18 1149    Clinical Impression Statement  Still limited with hip IR and flexion ROM but  improving from baseline with decreased hip stiffness. Functionally progressing with mobility tolerance with improving hip pain.    Clinical Presentation  Stable    Clinical Decision Making  Low    Rehab Potential  Good    Clinical Impairments Affecting Rehab Potential  expect good potential to improve hip mobility, flexibility and strength    PT Frequency  2x / week    PT Duration  6 weeks    PT Treatment/Interventions  Dry needling;Manual techniques;Joint Manipulations;Taping;ADLs/Self Care Home Management;Cryotherapy;Ultrasound;Moist Heat;Iontophoresis 4mg /ml Dexamethasone;Electrical Stimulation;Therapeutic activities;Therapeutic exercise;Neuromuscular re-education;Patient/family education;Passive range of motion;Gait training    PT Next Visit Plan  NUSTEP warm up, joint mobilization left hip use belt as needed, roller use left adductors, discuss lift for shoe for LLD as needed, assess if hips are level and focus on ROM    PT Home Exercise Plan  stretches: adductors and hip flexors (will add piriformis/external rotators as able), clamshells, SLR,     Consulted and Agree with Plan of Care  Patient       Patient will benefit from skilled therapeutic intervention in order to improve the following deficits and impairments:  Pain, Hypomobility, Decreased strength, Decreased range of motion, Decreased activity tolerance, Impaired flexibility, Difficulty walking  Visit Diagnosis: Pain in left hip  Stiffness of left hip, not elsewhere classified  Difficulty in walking, not elsewhere classified     Problem List There are no active problems to display for this patient.   Beaulah Dinning, PT, DPT 05/20/18 12:00 PM  [facility] [address] [place], [place], [address] Phone: [phone]   Fax:  [phone]  Name: Kano Heckmann MRN: [id number] Date of Birth: 1955/06/04

## 2018-05-22 ENCOUNTER — Ambulatory Visit: Payer: 59 | Admitting: Physical Therapy

## 2018-05-22 ENCOUNTER — Encounter: Payer: Self-pay | Admitting: Physical Therapy

## 2018-05-22 DIAGNOSIS — M25552 Pain in left hip: Secondary | ICD-10-CM | POA: Diagnosis not present

## 2018-05-22 DIAGNOSIS — M25652 Stiffness of left hip, not elsewhere classified: Secondary | ICD-10-CM

## 2018-05-22 DIAGNOSIS — R262 Difficulty in walking, not elsewhere classified: Secondary | ICD-10-CM

## 2018-05-22 NOTE — Therapy (Signed)
[place], [place], [address] Phone: [phone]   Fax:  [phone]  Physical Therapy Treatment  Patient Details  Name: Marc [name] MRN: [id number] Date of Birth: March 06, 1956 Referring Provider (PT): Jean Rosenthal, MD   Encounter Date: 05/22/2018  PT End of Session - 05/22/18 1135    Visit Number  5    Number of Visits  12    Date for PT Re-Evaluation  06/16/18    Authorization Type  UHC-23 visit limit    PT Start Time  1053    PT Stop Time  1135    PT Time Calculation (min)  42 min    Activity Tolerance  Patient tolerated treatment well    Behavior During Therapy  [facility] for tasks assessed/performed       Past Medical History:  Diagnosis Date  . Hyperlipidemia     Past Surgical History:  Procedure Laterality Date  . right shoulder rotator cuff repair Right     There were no vitals filed for this visit.  Subjective Assessment - 05/22/18 1052    Subjective  Noted some improvement after last tx./hip felt better after session. No new complaints/concerns otherwise this AM.                       [facility] Adult PT Treatment/Exercise - 05/22/18 0001      Knee/Hip Exercises: Stretches   Passive Hamstring Stretch  Left;3 reps;30 seconds    Piriformis Stretch  Left;3 reps;30 seconds    Other Knee/Hip Stretches  left adductor manual stretch 3x30 sec      Knee/Hip Exercises: Aerobic   Nustep  L4 x 5 min      Knee/Hip Exercises: Standing   Functional Squat Limitations  TRX squat 2x10      Knee/Hip Exercises: Supine   Bridges with Clamshell  --   green band 2x10   Other Supine Knee/Hip Exercises  alt. unilat. clamshell x 20 ea. bilat.             PT Education - 05/22/18 1135    Education Details  exercise form    Person(s) Educated  Patient    Methods  Explanation;Demonstration    Comprehension  Verbalized understanding;Returned demonstration          PT Long  Term Goals - 05/20/18 1158      PT LONG TERM GOAL #1   Title  Independent with HEP    Baseline  instructed at eval, will update prn    Time  6    Period  Weeks    Status  On-going      PT LONG TERM GOAL #2   Title  Improve FOTO score to 32% or less impairment    Baseline  42% limited at eval, not retested    Time  6    Period  Weeks    Status  On-going      PT LONG TERM GOAL #3   Title  Increase left hip flexion AROM 10 deg or greater to improve ability to donn shoes and perform transfers from low seating    Baseline  100 deg    Time  6    Period  Weeks    Status  On-going      PT LONG TERM GOAL #4   Title  Left hip strength grossly 5/5 to improve stability for prolonged ambulation/gait over uneven surfaces and squatting motions for IADLs  Baseline  MMTs not retested today    Time  6    Period  Weeks    Status  On-going            Plan - 05/22/18 1135    Clinical Impression Statement  Continued manual focus and stretches to address hip tightness. Added TRX squat with hip stretch at end-range for functional strengthening along with hip mobility. Improvement continues from baseline for hip ROM gains and tolerance ambulation.    Clinical Presentation  Stable    Clinical Decision Making  Low    Rehab Potential  Good    Clinical Impairments Affecting Rehab Potential  expect good potential to improve hip mobility, flexibility and strength    PT Frequency  2x / week    PT Duration  6 weeks    PT Next Visit Plan  NUSTEP warm up, joint mobilization left hip use belt as needed, roller use left adductors, discuss lift for shoe for LLD as needed, assess if hips are level and focus on ROM    PT Home Exercise Plan  stretches: adductors and hip flexors (will add piriformis/external rotators as able), clamshells, SLR,     Consulted and Agree with Plan of Care  Patient       Patient will benefit from skilled therapeutic intervention in order to improve the following deficits and  impairments:  Pain, Hypomobility, Decreased strength, Decreased range of motion, Decreased activity tolerance, Impaired flexibility, Difficulty walking  Visit Diagnosis: Pain in left hip  Stiffness of left hip, not elsewhere classified  Difficulty in walking, not elsewhere classified     Problem List There are no active problems to display for this patient.   Beaulah Dinning, PT, DPT 05/22/18 11:38 AM  [facility] [address] [place], [place], [address] Phone: [phone]   Fax:  [phone]  Name: Marc [name] MRN: [id number] Date of Birth: January 08, 1956

## 2018-05-27 ENCOUNTER — Ambulatory Visit: Payer: 59 | Admitting: Physical Therapy

## 2018-05-27 ENCOUNTER — Encounter: Payer: Self-pay | Admitting: Physical Therapy

## 2018-05-27 DIAGNOSIS — M25652 Stiffness of left hip, not elsewhere classified: Secondary | ICD-10-CM

## 2018-05-27 DIAGNOSIS — M25552 Pain in left hip: Secondary | ICD-10-CM

## 2018-05-27 DIAGNOSIS — R262 Difficulty in walking, not elsewhere classified: Secondary | ICD-10-CM

## 2018-05-27 NOTE — Therapy (Signed)
[place], [place], [address] Phone: [phone]   Fax:  [phone]  Physical Therapy Treatment  Patient Details  Name: Marc [name] MRN: [id number] Date of Birth: [birth year] Referring Provider (PT): Jean Rosenthal, MD   Encounter Date: 05/27/2018  PT End of Session - 05/27/18 1307    Visit Number  6    Number of Visits  12    Date for PT Re-Evaluation  06/16/18    Authorization Type  UHC-23 visit limit    PT Start Time  1059    PT Stop Time  1140    PT Time Calculation (min)  41 min    Activity Tolerance  Patient tolerated treatment well    Behavior During Therapy  [facility] for tasks assessed/performed       Past Medical History:  Diagnosis Date  . Hyperlipidemia     Past Surgical History:  Procedure Laterality Date  . right shoulder rotator cuff repair Right     There were no vitals filed for this visit.  Subjective Assessment - 05/27/18 1303    Subjective  Pt. reports had some soreness the evening of last tx. session but then (soreness) resolved. No pain pre-tx. this AM. Still having some hip stiffness.    Currently in Pain?  No/denies    Pain Score  0-No pain                       [facility] Adult PT Treatment/Exercise - 05/27/18 0001      Knee/Hip Exercises: Stretches   Hip Flexor Stretch  Left;3 reps;30 seconds    Hip Flexor Stretch Limitations  supine manual stretch    Piriformis Stretch  Left;3 reps;30 seconds    Piriformis Stretch Limitations  supine manual stretch    Other Knee/Hip Stretches  left adductor manual stretch 3x30 sec      Knee/Hip Exercises: Aerobic   Nustep  L5 x 5 min LE only      Knee/Hip Exercises: Standing   Forward Step Up Limitations  front step up with opp hip hike 2x10 on 6 in. step    Functional Squat Limitations  TRX squat 2x10    Other Standing Knee Exercises  Monster walk 20 feet x 2 with Green Theraband proximal to knees      Knee/Hip  Exercises: Supine   Bridges with Clamshell  --   2x10 with green band     Manual Therapy   Joint Mobilization  Left hip long axis distaction grade I-V, caudal glides and grade III-IV mobs for increased IR and ER with belt use             PT Education - 05/27/18 1307    Education Details  exercise progression    Person(s) Educated  Patient    Methods  Explanation;Demonstration;Verbal cues    Comprehension  Returned demonstration          PT Long Term Goals - 05/20/18 1158      PT LONG TERM GOAL #1   Title  Independent with HEP    Baseline  instructed at eval, will update prn    Time  6    Period  Weeks    Status  On-going      PT LONG TERM GOAL #2   Title  Improve FOTO score to 32% or less impairment    Baseline  42% limited at eval, not retested    Time  6  Period  Weeks    Status  On-going      PT LONG TERM GOAL #3   Title  Increase left hip flexion AROM 10 deg or greater to improve ability to donn shoes and perform transfers from low seating    Baseline  100 deg    Time  6    Period  Weeks    Status  On-going      PT LONG TERM GOAL #4   Title  Left hip strength grossly 5/5 to improve stability for prolonged ambulation/gait over uneven surfaces and squatting motions for IADLs    Baseline  MMTs not retested today    Time  6    Period  Weeks    Status  On-going            Plan - 05/27/18 1308    Clinical Impression Statement  Pt. continues to gradually improve from baseline status with hip ROM gains/decreased hip joint and muscle tightness. Functionally improving with tolerance mobility and prolonged standing from baseline status.    Clinical Presentation  Stable    Clinical Decision Making  Low    Rehab Potential  Good    Clinical Impairments Affecting Rehab Potential  expect good potential to improve hip mobility, flexibility and strength    PT Frequency  2x / week    PT Duration  6 weeks    PT Treatment/Interventions  Dry needling;Manual  techniques;Joint Manipulations;Taping;ADLs/Self Care Home Management;Cryotherapy;Ultrasound;Moist Heat;Iontophoresis 4mg /ml Dexamethasone;Electrical Stimulation;Therapeutic activities;Therapeutic exercise;Neuromuscular re-education;Patient/family education;Passive range of motion;Gait training    PT Next Visit Plan  NUSTEP warm up, joint mobilization left hip use belt as needed, roller use left adductors, discuss lift for shoe for LLD as needed, assess if hips are level and focus on ROM    PT Home Exercise Plan  stretches: adductors and hip flexors (will add piriformis/external rotators as able), clamshells, SLR,     Consulted and Agree with Plan of Care  Patient       Patient will benefit from skilled therapeutic intervention in order to improve the following deficits and impairments:  Pain, Hypomobility, Decreased strength, Decreased range of motion, Decreased activity tolerance, Impaired flexibility, Difficulty walking  Visit Diagnosis: Pain in left hip  Stiffness of left hip, not elsewhere classified  Difficulty in walking, not elsewhere classified     Problem List There are no active problems to display for this patient.   Beaulah Dinning, PT, DPT 05/27/18 1:09 PM  [facility] [address] [place], [place], [address] Phone: [phone]   Fax:  [phone]  Name: Marc [name] MRN: [id number] Date of Birth: [birth year]

## 2018-05-29 ENCOUNTER — Ambulatory Visit: Payer: 59 | Admitting: Physical Therapy

## 2018-05-29 ENCOUNTER — Encounter: Payer: Self-pay | Admitting: Physical Therapy

## 2018-05-29 DIAGNOSIS — M25552 Pain in left hip: Secondary | ICD-10-CM | POA: Diagnosis not present

## 2018-05-29 DIAGNOSIS — M25652 Stiffness of left hip, not elsewhere classified: Secondary | ICD-10-CM

## 2018-05-29 DIAGNOSIS — R262 Difficulty in walking, not elsewhere classified: Secondary | ICD-10-CM

## 2018-05-29 NOTE — Therapy (Signed)
[place], [place], [address] Phone: [phone]   Fax:  [phone]  Physical Therapy Treatment  Patient Details  Name: Marc [name] MRN: [id number] Date of Birth: 05-01-1955 Referring Provider (PT): Jean Rosenthal, MD   Encounter Date: 05/29/2018  PT End of Session - 05/29/18 1156    Visit Number  7    Number of Visits  12    Date for PT Re-Evaluation  06/16/18    Authorization Type  UHC-23 visit limit    PT Start Time  1100    PT Stop Time  1142    PT Time Calculation (min)  42 min    Activity Tolerance  Patient tolerated treatment well    Behavior During Therapy  [facility] for tasks assessed/performed       Past Medical History:  Diagnosis Date  . Hyperlipidemia     Past Surgical History:  Procedure Laterality Date  . right shoulder rotator cuff repair Right     There were no vitals filed for this visit.  Subjective Assessment - 05/29/18 1103    Subjective  Overall hip improving, still some intermittent pain if "steps a certain way."    Currently in Pain?  No/denies    Pain Score  0-No pain         [facility] PT Assessment - 05/29/18 0001      AROM   Left Hip Flexion  110    Left Hip External Rotation   60    Left Hip Internal Rotation   24    Left Hip ABduction  60      Strength   Left Hip Flexion  5/5    Left Hip Extension  5/5    Left Hip External Rotation  4+/5    Left Hip Internal Rotation  5/5    Left Hip ABduction  5/5    Left Hip ADduction  5/5                   [facility] Adult PT Treatment/Exercise - 05/29/18 0001      Knee/Hip Exercises: Stretches   Hip Flexor Stretch  Left;3 reps;30 seconds    Piriformis Stretch  Left;3 reps;30 seconds    Other Knee/Hip Stretches  left adductor manual stretch 3x30 sec      Knee/Hip Exercises: Aerobic   Nustep  L5 x 6 min LE only      Knee/Hip Exercises: Standing   Forward Step Up Limitations  front step up with opp hip  hike 8" step, 2x10 left side only    Functional Squat Limitations  Abd. squat with Green Theraband 2x10      Knee/Hip Exercises: Seated   Other Seated Knee/Hip Exercises  Theraband hip ER green 2x10      Knee/Hip Exercises: Supine   Bridges with Ball Squeeze  Strengthening;Both;2 sets;10 reps      Manual Therapy   Joint Mobilization  Left hip long axis distraction grade I-IV, left hpi mobilization with belt grade III-IV caudal glides and mobs ER, IR             PT Education - 05/29/18 1155    Education Details  progress, POC    Person(s) Educated  Patient    Methods  Explanation    Comprehension  Verbalized understanding          PT Long Term Goals - 05/29/18 1157      PT LONG TERM GOAL #1   Title  Independent with HEP    Baseline  instructed at eval, will update prn    Time  6    Period  Weeks    Status  On-going      PT LONG TERM GOAL #2   Title  Improve FOTO score to 32% or less impairment    Baseline  42% limited at eval, not retested    Time  6    Period  Weeks    Status  On-going      PT LONG TERM GOAL #3   Title  Increase left hip flexion AROM 10 deg or greater to improve ability to donn shoes and perform transfers from low seating    Baseline  110 deg    Time  6    Period  Weeks    Status  Achieved      PT LONG TERM GOAL #4   Title  Left hip strength grossly 5/5 to improve stability for prolonged ambulation/gait over uneven surfaces and squatting motions for IADLs    Baseline  met except ER    Time  6    Period  Weeks    Status  Partially Met            Plan - 05/29/18 1156    Clinical Impression Statement  Pt. making continued progress with left hip ROM and strength from previous status (see flowsheet). Improving re: therapy goals-based on progress and current status tentatively plan 2 more therapy visits next week then d/c to HEP/    Clinical Presentation  Stable    Clinical Decision Making  Low    Rehab Potential  Good    Clinical  Impairments Affecting Rehab Potential  expect good potential to improve hip mobility, flexibility and strength    PT Frequency  2x / week    PT Duration  6 weeks    PT Treatment/Interventions  Dry needling;Manual techniques;Joint Manipulations;Taping;ADLs/Self Care Home Management;Cryotherapy;Ultrasound;Moist Heat;Iontophoresis 75m/ml Dexamethasone;Electrical Stimulation;Therapeutic activities;Therapeutic exercise;Neuromuscular re-education;Patient/family education;Passive range of motion;Gait training    PT Next Visit Plan  NUSTEP warm up, joint mobilization left hip use belt as needed, roller use left adductors, discuss lift for shoe for LLD as needed, assess if hips are level and focus on ROM    PT Home Exercise Plan  stretches: adductors and hip flexors (will add piriformis/external rotators as able), clamshells, SLR,     Consulted and Agree with Plan of Care  Patient       Patient will benefit from skilled therapeutic intervention in order to improve the following deficits and impairments:  Pain, Hypomobility, Decreased strength, Decreased range of motion, Decreased activity tolerance, Impaired flexibility, Difficulty walking  Visit Diagnosis: Pain in left hip  Stiffness of left hip, not elsewhere classified  Difficulty in walking, not elsewhere classified     Problem List There are no active problems to display for this patient.   CBeaulah Dinning PT, DPT 05/29/18 11:59 AM  [facility] [address]Phone: [phone]  Fax:  [phone] Name: Marc FranssenMRN: [id number] of Birth: [birth year]

## 2018-06-03 ENCOUNTER — Encounter: Payer: Self-pay | Admitting: Physical Therapy

## 2018-06-03 ENCOUNTER — Ambulatory Visit: Payer: 59 | Attending: Orthopaedic Surgery | Admitting: Physical Therapy

## 2018-06-03 DIAGNOSIS — M25552 Pain in left hip: Secondary | ICD-10-CM | POA: Diagnosis not present

## 2018-06-03 DIAGNOSIS — R262 Difficulty in walking, not elsewhere classified: Secondary | ICD-10-CM | POA: Insufficient documentation

## 2018-06-03 DIAGNOSIS — M25652 Stiffness of left hip, not elsewhere classified: Secondary | ICD-10-CM | POA: Diagnosis not present

## 2018-06-03 NOTE — Therapy (Signed)
[place], [place], [address] Phone: [phone]   Fax:  [phone]  Physical Therapy Treatment  Patient Details  Name: Marc [name] MRN: [id number] Date of Birth: 1955-08-01 Referring Provider (PT): Jean Rosenthal, MD   Encounter Date: 06/03/2018  PT End of Session - 06/03/18 1236    Visit Number  8    Number of Visits  12    Date for PT Re-Evaluation  06/16/18    Authorization Type  UHC-23 visit limit    PT Start Time  1055    PT Stop Time  1138    PT Time Calculation (min)  43 min    Activity Tolerance  Patient tolerated treatment well    Behavior During Therapy  [facility] for tasks assessed/performed       Past Medical History:  Diagnosis Date  . Hyperlipidemia     Past Surgical History:  Procedure Laterality Date  . right shoulder rotator cuff repair Right     There were no vitals filed for this visit.  Subjective Assessment - 06/03/18 1057    Subjective  No pain pre-tx. but reports having to rely on ibuprofen to manage symptoms, can "feel the problem" in hip.     Currently in Pain?  No/denies                       [facility] Adult PT Treatment/Exercise - 06/03/18 0001      Knee/Hip Exercises: Stretches   Hip Flexor Stretch  Left;3 reps;30 seconds    Hip Flexor Stretch Limitations  supine manual stretch    Piriformis Stretch  Left;3 reps;30 seconds    Other Knee/Hip Stretches  left adductor manual stretch 3x30 sec      Knee/Hip Exercises: Aerobic   Nustep  L5 x 5 min LE only      Knee/Hip Exercises: Supine   Bridges with Clamshell  --   green band 2x10   Other Supine Knee/Hip Exercises  unilat. alt. clamshell green band x 20      Manual Therapy   Manual Therapy  Passive ROM    Joint Mobilization  Left hip long axis distraction grade I-IV and caudal glides, ER/IR mobs grade III-IV with belt    Passive ROM  left hip ER and IR in prone             PT Education  - 06/03/18 1236    Education Details  POC    Person(s) Educated  Patient    Methods  Explanation    Comprehension  Verbalized understanding          PT Long Term Goals - 05/29/18 1157      PT LONG TERM GOAL #1   Title  Independent with HEP    Baseline  instructed at eval, will update prn    Time  6    Period  Weeks    Status  On-going      PT LONG TERM GOAL #2   Title  Improve FOTO score to 32% or less impairment    Baseline  42% limited at eval, not retested    Time  6    Period  Weeks    Status  On-going      PT LONG TERM GOAL #3   Title  Increase left hip flexion AROM 10 deg or greater to improve ability to donn shoes and perform transfers from low seating    Baseline  110  deg    Time  6    Period  Weeks    Status  Achieved      PT LONG TERM GOAL #4   Title  Left hip strength grossly 5/5 to improve stability for prolonged ambulation/gait over uneven surfaces and squatting motions for IADLs    Baseline  met except ER    Time  6    Period  Weeks    Status  Partially Met            Plan - 06/03/18 1237    Clinical Impression Statement  Still improve from baseline with decreased hip pain and ROM. Functional status for prolonged standing and walking still impacted by hip with underlying degenerative changes.    Stability/Clinical Decision Making  Stable/Uncomplicated    Clinical Decision Making  Low    Rehab Potential  Good    Clinical Impairments Affecting Rehab Potential  expect good potential to improve hip mobility, flexibility and strength    PT Frequency  2x / week    PT Duration  6 weeks    PT Treatment/Interventions  Dry needling;Manual techniques;Joint Manipulations;Taping;ADLs/Self Care Home Management;Cryotherapy;Ultrasound;Moist Heat;Iontophoresis 25m/ml Dexamethasone;Electrical Stimulation;Therapeutic activities;Therapeutic exercise;Neuromuscular re-education;Patient/family education;Passive range of motion;Gait training    PT Next Visit Plan   NUSTEP warm up, joint mobilization left hip use belt as needed, roller use left adductors, discuss lift for shoe for LLD as needed, assess if hips are level and focus on ROM    PT Home Exercise Plan  stretches: adductors and hip flexors (will add piriformis/external rotators as able), clamshells, SLR,     Consulted and Agree with Plan of Care  Patient       Patient will benefit from skilled therapeutic intervention in order to improve the following deficits and impairments:  Pain, Hypomobility, Decreased strength, Decreased range of motion, Decreased activity tolerance, Impaired flexibility, Difficulty walking  Visit Diagnosis: Pain in left hip  Stiffness of left hip, not elsewhere classified  Difficulty in walking, not elsewhere classified     Problem List There are no active problems to display for this patient.   CBeaulah Dinning PT, DPT 06/03/18 12:40 PM  [facility] [address]Phone: [phone]  Fax:  [phone] Name: Marc ZeeMRN: [id number] of Birth: [birth year]

## 2018-06-05 ENCOUNTER — Encounter: Payer: Self-pay | Admitting: Physical Therapy

## 2018-06-05 ENCOUNTER — Ambulatory Visit: Payer: 59 | Admitting: Physical Therapy

## 2018-06-05 DIAGNOSIS — M25652 Stiffness of left hip, not elsewhere classified: Secondary | ICD-10-CM

## 2018-06-05 DIAGNOSIS — M25552 Pain in left hip: Secondary | ICD-10-CM | POA: Diagnosis not present

## 2018-06-05 DIAGNOSIS — R262 Difficulty in walking, not elsewhere classified: Secondary | ICD-10-CM

## 2018-06-05 NOTE — Therapy (Signed)
[place], [place], [address] Phone: [phone]   Fax:  [phone]  Physical Therapy Treatment  Patient Details  Name: Marc [name] MRN: [id number] Date of Birth: November 30, 1955 Referring Provider (PT): Jean Rosenthal, MD   Encounter Date: 06/05/2018  PT End of Session - 06/05/18 1306    Visit Number  9    Number of Visits  12    Date for PT Re-Evaluation  06/16/18    Authorization Type  UHC-23 visit limit    PT Start Time  1058    PT Stop Time  1140    PT Time Calculation (min)  42 min    Activity Tolerance  Patient tolerated treatment well    Behavior During Therapy  [facility] for tasks assessed/performed       Past Medical History:  Diagnosis Date  . Hyperlipidemia     Past Surgical History:  Procedure Laterality Date  . right shoulder rotator cuff repair Right     There were no vitals filed for this visit.  Subjective Assessment - 06/05/18 1101    Subjective  Improvement with tightness in hip noted but now reports "I feel the underlying problem" in hip regarding degenerative changes. Pt. reports improved flexibility, easier to perform activities such as tying shoes from ROM gains but still limited intermittently with weightbearing activities due to pain.    Pertinent History  possible leg length discrepancy, no other significant related history    Limitations  Standing;Walking;House hold activities         [facility] PT Assessment - 06/05/18 0001      Observation/Other Assessments   Focus on Therapeutic Outcomes (FOTO)   42% limited      AROM   Left Hip Flexion  110    Left Hip External Rotation   60    Left Hip Internal Rotation   20    Left Hip ABduction  55      Strength   Left Hip Flexion  5/5    Left Hip Extension  5/5    Left Hip External Rotation  5/5    Left Hip Internal Rotation  5/5    Left Hip ABduction  5/5    Left Hip ADduction  5/5                   [facility]  Adult PT Treatment/Exercise - 06/05/18 0001      Knee/Hip Exercises: Stretches   Hip Flexor Stretch  Left;3 reps;30 seconds    Piriformis Stretch  Left;3 reps;30 seconds    Other Knee/Hip Stretches  left adductor manual stretch 3x30 sec      Knee/Hip Exercises: Aerobic   Nustep  L5 x 5 min LE only      Knee/Hip Exercises: Standing   Hip Flexion  --   front SLR bilat. 2x10 with green band   Hip Abduction  Both;20 reps    Abduction Limitations  Green band    Functional Squat Limitations  TRX squat 2x10      Knee/Hip Exercises: Supine   Bridges with Ball Squeeze  Strengthening;Both;2 sets;10 reps    Other Supine Knee/Hip Exercises  clamshells bilat. 2x10 with blue band      Manual Therapy   Joint Mobilization  left hip long axis distraction grade I-IV, grade I-IV caudal glides and ER, IR mobilization using belt             PT Education - 06/05/18 1306  Education Details  POC, HEP updates    Person(s) Educated  Patient    Methods  Explanation;Demonstration;Verbal cues;Handout    Comprehension  Verbalized understanding;Returned demonstration          PT Long Term Goals - 06/05/18 1306      PT LONG TERM GOAL #1   Title  Independent with HEP    Baseline  met    Time  6    Period  Weeks    Status  Achieved      PT LONG TERM GOAL #2   Title  Improve FOTO score to 32% or less impairment    Baseline  42%    Time  6    Period  Weeks    Status  On-going      PT LONG TERM GOAL #3   Title  Increase left hip flexion AROM 10 deg or greater to improve ability to donn shoes and perform transfers from low seating    Baseline  110 deg    Time  6    Period  Weeks    Status  Achieved      PT LONG TERM GOAL #4   Title  Left hip strength grossly 5/5 to improve stability for prolonged ambulation/gait over uneven surfaces and squatting motions for IADLs    Baseline  5/5    Time  6    Period  Weeks    Status  Achieved            Plan - 06/05/18 1307    Clinical  Impression Statement  Pt. has made improvement from baseline status in terms of left hip ROM and strength with functional gains for activities such as improved ability to donn shoes. Functional status still limited with walking tolerance and tendency intermittent sharp pain in hip. Plan for pt. to try continuing via HEP for a few weeks and if going well tentative d/c otherwise potential return as needed. If symptoms persist/fail to improve recommend MD follow up for further status and treatment options.    Stability/Clinical Decision Making  Stable/Uncomplicated    Clinical Decision Making  Low    Rehab Potential  Good    PT Frequency  2x / week    PT Duration  6 weeks    PT Treatment/Interventions  Dry needling;Manual techniques;Joint Manipulations;Taping;ADLs/Self Care Home Management;Cryotherapy;Ultrasound;Moist Heat;Iontophoresis 16m/ml Dexamethasone;Electrical Stimulation;Therapeutic activities;Therapeutic exercise;Neuromuscular re-education;Patient/family education;Passive range of motion;Gait training    PT Next Visit Plan  NUSTEP warm up, joint mobilization left hip use belt as needed, roller use left adductors, discuss lift for shoe for LLD as needed, assess if hips are level and focus on ROM    PT Home Exercise Plan  see handout    Consulted and Agree with Plan of Care  Patient       Patient will benefit from skilled therapeutic intervention in order to improve the following deficits and impairments:  Pain, Hypomobility, Decreased strength, Decreased range of motion, Decreased activity tolerance, Impaired flexibility, Difficulty walking  Visit Diagnosis: Pain in left hip  Stiffness of left hip, not elsewhere classified  Difficulty in walking, not elsewhere classified     Problem List There are no active problems to display for this patient.   CBeaulah Dinning PT, DPT 06/05/18 1:11 PM  [facility] [address]Phone: [phone]  Fax:  [phone] Name: Marc NavarreteMRN: [id number] of Birth: [birth year]

## 2018-06-11 DIAGNOSIS — Z800 Family history of malignant neoplasm of digestive organs: Secondary | ICD-10-CM | POA: Diagnosis not present

## 2018-06-11 DIAGNOSIS — K5730 Diverticulosis of large intestine without perforation or abscess without bleeding: Secondary | ICD-10-CM | POA: Diagnosis not present

## 2018-06-11 DIAGNOSIS — K640 First degree hemorrhoids: Secondary | ICD-10-CM | POA: Diagnosis not present

## 2018-07-03 ENCOUNTER — Ambulatory Visit: Payer: 59 | Admitting: Physical Therapy

## 2018-07-08 ENCOUNTER — Telehealth: Payer: Self-pay | Admitting: Physical Therapy

## 2018-07-08 NOTE — Telephone Encounter (Signed)
Marc [name] was contacted today regarding the temporary reduction of OP Rehab Services due to concerns for community transmission of Covid-19.    Therapist advised the patient to continue to perform their HEP and assured they had no unanswered questions at this time.  The patient was offered and declined the continuation in their POC by using methods such as an e-visit, virtual check in, or telehealth visit.    Outpatient Rehabilitation Services will follow up with this client when we are able to safely resume care at the [facility] in person.   Patient is aware we can be reached by telephone during limited business hours in the meantime.

## 2018-07-17 DIAGNOSIS — E7800 Pure hypercholesterolemia, unspecified: Secondary | ICD-10-CM | POA: Diagnosis not present

## 2018-08-05 ENCOUNTER — Telehealth: Payer: Self-pay | Admitting: Physical Therapy

## 2018-08-05 NOTE — Telephone Encounter (Signed)
Spoke with patient- he would like to return to the clinic for PT visits. We will call him to schedule. Anterrio Mccleery C. Meta Kroenke PT, DPT 08/05/18 1:37 PM

## 2018-08-13 ENCOUNTER — Other Ambulatory Visit: Payer: Self-pay

## 2018-08-13 ENCOUNTER — Encounter: Payer: Self-pay | Admitting: Physical Therapy

## 2018-08-13 ENCOUNTER — Ambulatory Visit: Payer: 59 | Attending: Orthopaedic Surgery | Admitting: Physical Therapy

## 2018-08-13 DIAGNOSIS — R262 Difficulty in walking, not elsewhere classified: Secondary | ICD-10-CM | POA: Insufficient documentation

## 2018-08-13 DIAGNOSIS — M25652 Stiffness of left hip, not elsewhere classified: Secondary | ICD-10-CM | POA: Insufficient documentation

## 2018-08-13 DIAGNOSIS — M25552 Pain in left hip: Secondary | ICD-10-CM | POA: Insufficient documentation

## 2018-08-13 NOTE — Therapy (Signed)
[place], [place], [address] Phone: [phone]   Fax:  [phone]  Physical Therapy Treatment  Patient Details  Name: Marc [name] MRN: [id number] Date of Birth: [birth year] Referring Provider (PT): Jean Rosenthal, MD   Encounter Date: 08/13/2018  PT End of Session - 08/13/18 1235    Visit Number  10    Number of Visits  22    Date for PT Re-Evaluation  06/16/18    Authorization Type  UHC-23 visit limit    PT Start Time  1015    PT Stop Time  1058    PT Time Calculation (min)  43 min    Activity Tolerance  Patient tolerated treatment well    Behavior During Therapy  [facility] for tasks assessed/performed       Past Medical History:  Diagnosis Date  . Hyperlipidemia     Past Surgical History:  Procedure Laterality Date  . right shoulder rotator cuff repair Right     There were no vitals filed for this visit.  Subjective Assessment - 08/13/18 1018    Subjective  Pt. returns, not seen since 06/05/18-previous follow up had been scheduled but was cancelled due to Covid outbreak so pt. returns to follow up on status. Left hip pain around 1-2/10 this AM- Still noting some intermittent sharp/more severe pain.    Patient Stated Goals  Improve flexibility in hip    Currently in Pain?  Yes    Pain Score  2     Pain Location  Hip    Pain Orientation  Left;Anterior    Pain Descriptors / Indicators  --   "twinge"   Pain Type  Chronic pain    Pain Radiating Towards  groin, left anterior hip    Pain Onset  More than a month ago    Pain Frequency  Intermittent    Aggravating Factors   turning motions, walking    Pain Relieving Factors  medication, injection    Effect of Pain on Daily Activities  limits tolerance community ambulation, standing activities, IADLs         [facility] PT Assessment - 08/13/18 0001      Observation/Other Assessments   Focus on Therapeutic Outcomes (FOTO)   34% Limited      ROM  / Strength   AROM / PROM / Strength  Strength   Left hip ext 4+/5 otherwise left hip 5/5     AROM   Left Hip Flexion  105    Left Hip External Rotation   40    Left Hip Internal Rotation   10    Left Hip ABduction  45                   [facility] Adult PT Treatment/Exercise - 08/13/18 0001      Knee/Hip Exercises: Stretches   Hip Flexor Stretch  Left;3 reps;30 seconds    Hip Flexor Stretch Limitations  supine manual stretch    Piriformis Stretch  Left;3 reps;30 seconds    Other Knee/Hip Stretches  left adductor supine manual stretch      Knee/Hip Exercises: Aerobic   Nustep  L5 x 5 min      Manual Therapy   Joint Mobilization  Left hip long axis distraction grade III-IV oscillations, prone hip ER mobs with grade III-IV AP with knee on stool, cross body hip IR mobs grade III-IV AP, hpi ER and flex mobs in supine with belt grade III-IV  PT Education - 08/13/18 1235    Education Details  POC    Person(s) Educated  Patient    Methods  Explanation    Comprehension  Verbalized understanding          PT Long Term Goals - 08/13/18 1241      PT LONG TERM GOAL #1   Title  Independent with HEP    Baseline  met with previous HEP, will further update as needed    Time  6    Period  Weeks    Status  Achieved    Target Date  09/24/18      PT LONG TERM GOAL #2   Title  Improve FOTO score to 32% or less impairment    Baseline  34%    Time  6    Period  Weeks    Status  On-going    Target Date  09/24/18      PT LONG TERM GOAL #3   Title  Increase left hip flexion AROM 10 deg or greater to improve ability to donn shoes and perform transfers from low seating      PT LONG TERM GOAL #4   Title  Left hip strength grossly 5/5 to improve stability for prolonged ambulation/gait over uneven surfaces and squatting motions for IADLs    Baseline  hip ext 4+/5    Time  6    Period  Weeks    Status  On-going    Target Date  09/24/18            Plan -  08/13/18 1236    Clinical Impression Statement  Pt. returns after prolonged absence from therapy impacted by Covid outbreak. Functionally improving as evidenced by FOTO score improvement but some setback with increased hip stiffness from previous status, also still with some hip weakness in extension. Pt. would benefit from resumed/continued PT for further progress to address remaining functional limitations.    Personal Factors and Comorbidities  Time since onset of injury/illness/exacerbation    Examination-Activity Limitations  Locomotion Level;Stand;Stairs;Dressing    Stability/Clinical Decision Making  Stable/Uncomplicated    Rehab Potential  Good    PT Frequency  2x / week    PT Duration  6 weeks    PT Treatment/Interventions  Dry needling;Manual techniques;Joint Manipulations;Taping;ADLs/Self Care Home Management;Cryotherapy;Ultrasound;Moist Heat;Iontophoresis 52m/ml Dexamethasone;Electrical Stimulation;Therapeutic activities;Therapeutic exercise;Neuromuscular re-education;Patient/family education;Passive range of motion;Gait training    PT Next Visit Plan  Focus manual therapy to address hip ROM limitations    PT Home Exercise Plan  see handout    Consulted and Agree with Plan of Care  Patient       Patient will benefit from skilled therapeutic intervention in order to improve the following deficits and impairments:  Pain, Hypomobility, Decreased strength, Decreased range of motion, Decreased activity tolerance, Impaired flexibility, Difficulty walking  Visit Diagnosis: Pain in left hip  Stiffness of left hip, not elsewhere classified  Difficulty in walking, not elsewhere classified     Problem List There are no active problems to display for this patient.   CBeaulah Dinning PT, DPT 08/13/18 1:23 PM  [facility] C[facility] [address]Phone: [phone]  Fax:  [phone] Name: Marc MulaMRN:  [id number] of Birth: 5Dec 28, 1957

## 2018-08-22 ENCOUNTER — Other Ambulatory Visit: Payer: Self-pay

## 2018-08-22 ENCOUNTER — Ambulatory Visit: Payer: 59 | Admitting: Physical Therapy

## 2018-08-22 ENCOUNTER — Encounter: Payer: Self-pay | Admitting: Physical Therapy

## 2018-08-22 DIAGNOSIS — R262 Difficulty in walking, not elsewhere classified: Secondary | ICD-10-CM

## 2018-08-22 DIAGNOSIS — M25552 Pain in left hip: Secondary | ICD-10-CM

## 2018-08-22 DIAGNOSIS — M25652 Stiffness of left hip, not elsewhere classified: Secondary | ICD-10-CM

## 2018-08-22 NOTE — Therapy (Signed)
[place], [place], [address] Phone: [phone]   Fax:  [phone]  Physical Therapy Treatment  Patient Details  Name: Marc [name] MRN: [id number] Date of Birth: December 15, 1955 Referring Provider (PT): Jean Rosenthal, MD   Encounter Date: 08/22/2018  PT End of Session - 08/22/18 1017    Visit Number  11    Number of Visits  22    Date for PT Re-Evaluation  09/24/18    Authorization Type  UHC-23 visit limit    PT Start Time  1014    PT Stop Time  1054    PT Time Calculation (min)  40 min    Activity Tolerance  Patient tolerated treatment well    Behavior During Therapy  [facility] for tasks assessed/performed       Past Medical History:  Diagnosis Date  . Hyperlipidemia     Past Surgical History:  Procedure Laterality Date  . right shoulder rotator cuff repair Right     There were no vitals filed for this visit.  Subjective Assessment - 08/22/18 1015    Subjective  Pt. reports relief after last session which lasted around 2-3 days. Limping due to hip noted this AM.    Currently in Pain?  Yes    Pain Score  2     Pain Location  Hip    Pain Orientation  Left;Anterior    Pain Descriptors / Indicators  Sharp    Pain Type  Chronic pain    Pain Radiating Towards  groin, anterior hip    Pain Onset  More than a month ago    Pain Frequency  Intermittent    Aggravating Factors   walking    Pain Relieving Factors  medication, injection, joint mobs/stretching    Effect of Pain on Daily Activities  impacts walking tolerance                       [facility] Adult PT Treatment/Exercise - 08/22/18 0001      Knee/Hip Exercises: Stretches   Hip Flexor Stretch  Left;2 reps;30 seconds    Hip Flexor Stretch Limitations  supine manual stretch    Piriformis Stretch  Left;3 reps;30 seconds    Piriformis Stretch Limitations  supine manual stretch    Other Knee/Hip Stretches  left adductor manual  stretch 3x30 sec      Knee/Hip Exercises: Aerobic   Nustep  L5 x 5 min      Manual Therapy   Joint Mobilization  Left hip long axis distraction grade III-V, supine mobs with belt left hip flex and ER grade III-IV, AP mob IR grade III-IV, prone ER mob with knee on stool grade III-IV             PT Education - 08/22/18 1018    Education Details  exercises    Person(s) Educated  Patient    Methods  Explanation    Comprehension  Verbalized understanding          PT Long Term Goals - 08/13/18 1241      PT LONG TERM GOAL #1   Title  Independent with HEP    Baseline  met with previous HEP, will further update as needed    Time  6    Period  Weeks    Status  Achieved    Target Date  09/24/18      PT LONG TERM GOAL #2   Title  Improve FOTO  score to 32% or less impairment    Baseline  34%    Time  6    Period  Weeks    Status  On-going    Target Date  09/24/18      PT LONG TERM GOAL #3   Title  Increase left hip flexion AROM 10 deg or greater to improve ability to donn shoes and perform transfers from low seating      PT LONG TERM GOAL #4   Title  Left hip strength grossly 5/5 to improve stability for prolonged ambulation/gait over uneven surfaces and squatting motions for IADLs    Baseline  hip ext 4+/5    Time  6    Period  Weeks    Status  On-going    Target Date  09/24/18            Plan - 08/22/18 1058    Clinical Impression Statement  Good response to tx. on return after absence from therapy though relief temporary. Multiplanar stiffness still noted in hip with functional limitations for activities such as dressing so continued manual therapy and stretching emphasis to address.    Stability/Clinical Decision Making  Stable/Uncomplicated    Clinical Decision Making  Low    Rehab Potential  Good    PT Frequency  --   1-2x/week   PT Duration  6 weeks    PT Treatment/Interventions  Dry needling;Manual techniques;Joint Manipulations;Taping;ADLs/Self Care  Home Management;Cryotherapy;Ultrasound;Moist Heat;Iontophoresis 17m/ml Dexamethasone;Electrical Stimulation;Therapeutic activities;Therapeutic exercise;Neuromuscular re-education;Patient/family education;Passive range of motion;Gait training    PT Next Visit Plan  Focus manual therapy to address hip ROM limitations    PT Home Exercise Plan  see handout       Patient will benefit from skilled therapeutic intervention in order to improve the following deficits and impairments:  Pain, Hypomobility, Decreased strength, Decreased range of motion, Decreased activity tolerance, Impaired flexibility, Difficulty walking  Visit Diagnosis: Pain in left hip  Stiffness of left hip, not elsewhere classified  Difficulty in walking, not elsewhere classified     Problem List There are no active problems to display for this patient.   CBeaulah Dinning PT, DPT 08/22/18 11:01 AM  C[facility] [address]Phone: [phone]  Fax:  [phone] Name: Marc RatajczakMRN: [id number] of Birth: 5October 23, 1957

## 2018-09-01 ENCOUNTER — Ambulatory Visit: Payer: 59 | Attending: Orthopaedic Surgery | Admitting: Physical Therapy

## 2018-09-01 ENCOUNTER — Encounter: Payer: Self-pay | Admitting: Physical Therapy

## 2018-09-01 DIAGNOSIS — R262 Difficulty in walking, not elsewhere classified: Secondary | ICD-10-CM | POA: Insufficient documentation

## 2018-09-01 DIAGNOSIS — M25652 Stiffness of left hip, not elsewhere classified: Secondary | ICD-10-CM | POA: Diagnosis present

## 2018-09-01 DIAGNOSIS — M25552 Pain in left hip: Secondary | ICD-10-CM | POA: Insufficient documentation

## 2018-09-01 NOTE — Therapy (Signed)
[place], [place], [address] Phone: [phone]   Fax:  [phone]  Physical Therapy Treatment  Patient Details  Name: Marc [name] MRN: [id number] Date of Birth: 1955/07/08 Referring Provider (PT): Jean Rosenthal, MD   Encounter Date: 09/01/2018  PT End of Session - 09/01/18 1048    Visit Number  12    Number of Visits  22    Date for PT Re-Evaluation  09/24/18    Authorization Type  UHC-23 visit limit    PT Start Time  1006    PT Stop Time  1046    PT Time Calculation (min)  40 min    Activity Tolerance  Patient tolerated treatment well    Behavior During Therapy  [facility] for tasks assessed/performed       Past Medical History:  Diagnosis Date  . Hyperlipidemia     Past Surgical History:  Procedure Laterality Date  . right shoulder rotator cuff repair Right     There were no vitals filed for this visit.  Subjective Assessment - 09/01/18 1007    Subjective  "Can almost live with (level of hip pain). Still noting stiffness, soreness and continues to have a limp with gait, reports last tx. helpful.    Currently in Pain?  Yes    Pain Score  2     Pain Location  Hip    Pain Orientation  Left;Anterior    Pain Type  Chronic pain    Pain Radiating Towards  groin, anterior hip    Pain Onset  More than a month ago    Pain Frequency  Intermittent    Aggravating Factors   walking    Pain Relieving Factors  medication, injection, joint mobs/stretching    Effect of Pain on Daily Activities  limits standing/walking tolerance         [facility] PT Assessment - 09/01/18 0001      AROM   Left Hip Flexion  107    Left Hip External Rotation   45    Left Hip Internal Rotation   12    Left Hip ABduction  45      Strength   Left Hip Extension  4+/5    Left Hip ABduction  5/5                   [facility] Adult PT Treatment/Exercise - 09/01/18 0001      Knee/Hip Exercises: Stretches   Hip  Flexor Stretch  Left;3 reps;30 seconds    Hip Flexor Stretch Limitations  supine manual stretch    Piriformis Stretch  Left;3 reps;20 seconds    Piriformis Stretch Limitations  supine manual stretch    Other Knee/Hip Stretches  HEP instruction self hip IR stretch/mobilization from quadruped      Knee/Hip Exercises: Aerobic   Nustep  L5 x 5 min      Manual Therapy   Joint Mobilization  Left hip long axis distraction grade I-IV, ER and flexion mobs with belt grade IIII-IV, prone ER mobs AP grade I-IV for ER    Passive ROM  left hip all directions             PT Education - 09/01/18 1048    Education Details  HEP stretch, MD follow up recommended    Person(s) Educated  Patient    Methods  Explanation;Demonstration    Comprehension  Verbalized understanding          PT Long  Term Goals - 09/01/18 1051      PT LONG TERM GOAL #1   Title  Independent with HEP    Baseline  met with previous HEP, updated today with IR stretch    Time  6    Period  Weeks      PT LONG TERM GOAL #2   Title  Improve FOTO score to 32% or less impairment    Baseline  34% 2 weeks ago, not retested today    Time  6    Period  Weeks    Status  On-going      PT LONG TERM GOAL #3   Title  Increase left hip flexion AROM 10 deg or greater to improve ability to donn shoes and perform transfers from low seating    Baseline  107 deg    Time  6    Period  Weeks    Status  On-going      PT LONG TERM GOAL #4   Title  Left hip strength grossly 5/5 to improve stability for prolonged ambulation/gait over uneven surfaces and squatting motions for IADLs    Baseline  hip ext 4+/5    Time  6    Period  Weeks    Status  On-going            Plan - 09/01/18 1049    Clinical Impression Statement  Mild ROM gains from status noted at return to therapy following absence due to Covid outbreak. Modest gains with pain but continues with gait impairments. Plan continue PT per POC noted on re-eval but if  limitations continue recommened MD follow up for further assessment and tx. plan.    Stability/Clinical Decision Making  Stable/Uncomplicated    Clinical Decision Making  Low    Rehab Potential  Good    Clinical Impairments Affecting Rehab Potential  expect good potential to improve hip mobility, flexibility and strength    PT Frequency  --   1-2x/week   PT Duration  6 weeks    PT Treatment/Interventions  Dry needling;Manual techniques;Joint Manipulations;Taping;ADLs/Self Care Home Management;Cryotherapy;Ultrasound;Moist Heat;Iontophoresis 22m/ml Dexamethasone;Electrical Stimulation;Therapeutic activities;Therapeutic exercise;Neuromuscular re-education;Patient/family education;Passive range of motion;Gait training    PT Next Visit Plan  Focus manual therapy to address hip ROM limitations    PT Home Exercise Plan  see handout    Consulted and Agree with Plan of Care  Patient       Patient will benefit from skilled therapeutic intervention in order to improve the following deficits and impairments:  Pain, Hypomobility, Decreased strength, Decreased range of motion, Decreased activity tolerance, Impaired flexibility, Difficulty walking  Visit Diagnosis: Pain in left hip  Stiffness of left hip, not elsewhere classified  Difficulty in walking, not elsewhere classified     Problem List There are no active problems to display for this patient.   CBeaulah Dinning PT, DPT 09/01/18 10:53 AM  CFlatirons Surgery Center LLC17717 Division LaneGLamesa N[place] [address]Phone: [phone]  Fax:  [phone] Name: FGor VestalMRN: [id number] of Birth: 5March 03, 1957

## 2018-09-05 ENCOUNTER — Ambulatory Visit: Payer: 59 | Admitting: Physical Therapy

## 2018-09-11 ENCOUNTER — Other Ambulatory Visit: Payer: Self-pay

## 2018-09-11 ENCOUNTER — Encounter: Payer: Self-pay | Admitting: Physical Therapy

## 2018-09-11 ENCOUNTER — Ambulatory Visit: Payer: 59 | Admitting: Physical Therapy

## 2018-09-11 DIAGNOSIS — R262 Difficulty in walking, not elsewhere classified: Secondary | ICD-10-CM

## 2018-09-11 DIAGNOSIS — M25552 Pain in left hip: Secondary | ICD-10-CM

## 2018-09-11 DIAGNOSIS — M25652 Stiffness of left hip, not elsewhere classified: Secondary | ICD-10-CM

## 2018-09-11 NOTE — Therapy (Signed)
[place], [place], [address] Phone: [phone]   Fax:  [phone]  Physical Therapy Treatment  Patient Details  Name: Marc [name] MRN: [id number] Date of Birth: 02-26-1956 Referring Provider (PT): Jean Rosenthal, MD   Encounter Date: 09/11/2018  PT End of Session - 09/11/18 1256    Visit Number  13    Number of Visits  22    Date for PT Re-Evaluation  09/24/18    Authorization Type  UHC-23 visit limit    PT Start Time  1255    PT Stop Time  1339    PT Time Calculation (min)  44 min    Activity Tolerance  Patient tolerated treatment well    Behavior During Therapy  [facility] for tasks assessed/performed       Past Medical History:  Diagnosis Date  . Hyperlipidemia     Past Surgical History:  Procedure Laterality Date  . right shoulder rotator cuff repair Right     There were no vitals filed for this visit.  Subjective Assessment - 09/11/18 1258    Subjective  Pt. reports had good week last week following previous tx. but had to miss PT appointment due to therapist out and hip feeling like it is tightening back up this week. Still no MD follow up yet but her reports plans to schedule.    Currently in Pain?  Yes    Pain Score  2     Pain Location  Hip    Pain Orientation  Left;Anterior    Pain Descriptors / Indicators  Sharp    Pain Type  Chronic pain    Pain Radiating Towards  groin, anterior hip    Pain Onset  More than a month ago    Pain Frequency  Intermittent    Aggravating Factors   walking and standing    Pain Relieving Factors  ibuprofen, PT    Effect of Pain on Daily Activities  limits walking and standing tolerance for IADLs and community mobility.         [facility] PT Assessment - 09/11/18 0001      AROM   Left Hip Flexion  105    Left Hip External Rotation   45    Left Hip Internal Rotation   15    Left Hip ABduction  45                   [facility] Adult PT  Treatment/Exercise - 09/11/18 0001      Knee/Hip Exercises: Stretches   Hip Flexor Stretch  Left;3 reps;30 seconds    Hip Flexor Stretch Limitations  supine manual stretch from modified Thomas test position at edge of mat    Piriformis Stretch  Left;3 reps;30 seconds    Piriformis Stretch Limitations  supine manual stretch    Other Knee/Hip Stretches  left adductor manual stretch 3x30 sec      Knee/Hip Exercises: Aerobic   Nustep  L5 x 5 min      Manual Therapy   Joint Mobilization  Left hip long axis distraction, supine hip flex, ER mobs with belt, IR AP mobs, prone PA mobs grade III-IV             PT Education - 09/11/18 1342    Education Details  POC    Person(s) Educated  Patient    Methods  Explanation    Comprehension  Verbalized understanding  PT Long Term Goals - 09/01/18 1051      PT LONG TERM GOAL #1   Title  Independent with HEP    Baseline  met with previous HEP, updated today with IR stretch    Time  6    Period  Weeks      PT LONG TERM GOAL #2   Title  Improve FOTO score to 32% or less impairment    Baseline  34% 2 weeks ago, not retested today    Time  6    Period  Weeks    Status  On-going      PT LONG TERM GOAL #3   Title  Increase left hip flexion AROM 10 deg or greater to improve ability to donn shoes and perform transfers from low seating    Baseline  107 deg    Time  6    Period  Weeks    Status  On-going      PT LONG TERM GOAL #4   Title  Left hip strength grossly 5/5 to improve stability for prolonged ambulation/gait over uneven surfaces and squatting motions for IADLs    Baseline  hip ext 4+/5    Time  6    Period  Weeks    Status  On-going            Plan - 09/11/18 1343    Clinical Impression Statement  As previously pt. responds to tx. with symptom relief/associated functional gains for walking tolerance-mild setback with time lag in between therapy visits. For now plan continue therapy for lat recert given  benefit noted but recommend schedule MD follow up for further assessment/long term plan.    Stability/Clinical Decision Making  Stable/Uncomplicated    Clinical Decision Making  Low    Rehab Potential  Good    Clinical Impairments Affecting Rehab Potential  expect good potential to improve hip mobility, flexibility and strength    PT Frequency  --   1-2x/week   PT Duration  6 weeks    PT Treatment/Interventions  Dry needling;Manual techniques;Joint Manipulations;Taping;ADLs/Self Care Home Management;Cryotherapy;Ultrasound;Moist Heat;Iontophoresis 6m/ml Dexamethasone;Electrical Stimulation;Therapeutic activities;Therapeutic exercise;Neuromuscular re-education;Patient/family education;Passive range of motion;Gait training    PT Next Visit Plan  Focus manual therapy to address hip ROM limitations    PT Home Exercise Plan  see handout    Consulted and Agree with Plan of Care  Patient       Patient will benefit from skilled therapeutic intervention in order to improve the following deficits and impairments:  Pain, Hypomobility, Decreased strength, Decreased range of motion, Decreased activity tolerance, Impaired flexibility, Difficulty walking  Visit Diagnosis: Pain in left hip  Stiffness of left hip, not elsewhere classified  Difficulty in walking, not elsewhere classified     Problem List There are no active problems to display for this patient.   CBeaulah Dinning PT, DPT 09/11/18 1:46 PM  CColchesterCKarmanos Cancer Center16 [address] DriveGSan Ardo N[place] [address]Phone: [phone]  Fax:  [phone] Name: Marc ShawlerMRN: [id number] of Birth: 5Mar 15, 1957

## 2018-09-18 ENCOUNTER — Other Ambulatory Visit: Payer: Self-pay

## 2018-09-18 ENCOUNTER — Encounter: Payer: Self-pay | Admitting: Physical Therapy

## 2018-09-18 ENCOUNTER — Ambulatory Visit: Payer: 59 | Admitting: Physical Therapy

## 2018-09-18 DIAGNOSIS — R262 Difficulty in walking, not elsewhere classified: Secondary | ICD-10-CM

## 2018-09-18 DIAGNOSIS — M25552 Pain in left hip: Secondary | ICD-10-CM

## 2018-09-18 DIAGNOSIS — M25652 Stiffness of left hip, not elsewhere classified: Secondary | ICD-10-CM

## 2018-09-18 NOTE — Therapy (Signed)
[place], [place], [address] Phone: [phone]   Fax:  [phone]  Physical Therapy Treatment  Patient Details  Name: Marc [name] MRN: [id number] Date of Birth: 1955-08-14 Referring Provider (PT): Jean Rosenthal, MD   Encounter Date: 09/18/2018  PT End of Session - 09/18/18 1647    Visit Number  14    Number of Visits  22    Date for PT Re-Evaluation  09/24/18    Authorization Type  U[employer]-23 visit limit    PT Start Time  1605    PT Stop Time  1643    PT Time Calculation (min)  38 min    Activity Tolerance  Patient tolerated treatment well    Behavior During Therapy  [facility] for tasks assessed/performed       Past Medical History:  Diagnosis Date  . Hyperlipidemia     Past Surgical History:  Procedure Laterality Date  . right shoulder rotator cuff repair Right     There were no vitals filed for this visit.  Subjective Assessment - 09/18/18 1605    Subjective  Left hip pain 2-3/10 this PM. Pt. sees Dr. Ninfa Linden next week for follow up re: hip. At times hip feels OK but then tendency for pain to recur.    Currently in Pain?  Yes    Pain Score  3     Pain Location  Hip    Pain Orientation  Left;Anterior    Pain Descriptors / Indicators  Sharp    Pain Type  Chronic pain    Pain Radiating Towards  groin, anterior hip    Pain Onset  More than a month ago    Pain Frequency  Intermittent    Aggravating Factors   walking and standing, hip ROM for dressing    Pain Relieving Factors  ibuprofen, joint mobs    Effect of Pain on Daily Activities  limits walking and standing tolerance, ability ADLs such as dressing                       [facility] Adult PT Treatment/Exercise - 09/18/18 0001      Knee/Hip Exercises: Stretches   Hip Flexor Stretch  Left;3 reps;30 seconds    Hip Flexor Stretch Limitations  supine manual stretch    Piriformis Stretch  Left;3 reps;30 seconds    Piriformis  Stretch Limitations  supine manual stretch    Other Knee/Hip Stretches  left adductor manual stretch 3x30 sec      Knee/Hip Exercises: Supine   Other Supine Knee/Hip Exercises  pelvic tilt 2x10 during manual hip distraction      Manual Therapy   Joint Mobilization  Left hip long axis distraction, supine hip flex, ER mobs with belt, IR AP mobs, prone PA mobs grade III-IV    Passive ROM  left hip all directions             PT Education - 09/18/18 1646    Education Details  POC    Person(s) Educated  Patient    Methods  Explanation    Comprehension  Verbalized understanding          PT Long Term Goals - 09/01/18 1051      PT LONG TERM GOAL #1   Title  Independent with HEP    Baseline  met with previous HEP, updated today with IR stretch    Time  6    Period  Weeks  PT LONG TERM GOAL #2   Title  Improve FOTO score to 32% or less impairment    Baseline  34% 2 weeks ago, not retested today    Time  6    Period  Weeks    Status  On-going      PT LONG TERM GOAL #3   Title  Increase left hip flexion AROM 10 deg or greater to improve ability to donn shoes and perform transfers from low seating    Baseline  107 deg    Time  6    Period  Weeks    Status  On-going      PT LONG TERM GOAL #4   Title  Left hip strength grossly 5/5 to improve stability for prolonged ambulation/gait over uneven surfaces and squatting motions for IADLs    Baseline  hip ext 4+/5    Time  6    Period  Weeks    Status  On-going            Plan - 09/18/18 1647    Clinical Impression Statement  Temporary symptom ease with tx. and modest gains with hip ROM but fair progress overall with pain and mobility status. Plan await status at MD visit next week for further POC    Stability/Clinical Decision Making  Stable/Uncomplicated    PT Frequency  --   1-2x/week   PT Treatment/Interventions  Dry needling;Manual techniques;Joint Manipulations;Taping;ADLs/Self Care Home  Management;Cryotherapy;Ultrasound;Moist Heat;Iontophoresis 13m/ml Dexamethasone;Electrical Stimulation;Therapeutic activities;Therapeutic exercise;Neuromuscular re-education;Patient/family education;Passive range of motion;Gait training    PT Next Visit Plan  MD visit prior to last currently shceduled therapy appointment to determine further POC    PT Home Exercise Plan  see handout    Consulted and Agree with Plan of Care  Patient       Patient will benefit from skilled therapeutic intervention in order to improve the following deficits and impairments:  Pain, Hypomobility, Decreased strength, Decreased range of motion, Decreased activity tolerance, Impaired flexibility, Difficulty walking  Visit Diagnosis: 1. Pain in left hip   2. Stiffness of left hip, not elsewhere classified   3. Difficulty in walking, not elsewhere classified        Problem List There are no active problems to display for this patient.  CBeaulah Dinning PT, DPT 09/18/18 4:50 PM  [facility] [place] [address]Phone: [phone]  Fax:  [phone] Name: FTyner CodnerMRN: [id number] of Birth: 505-17-1957

## 2018-09-19 ENCOUNTER — Encounter

## 2018-09-24 ENCOUNTER — Other Ambulatory Visit: Payer: Self-pay

## 2018-09-24 ENCOUNTER — Ambulatory Visit (INDEPENDENT_AMBULATORY_CARE_PROVIDER_SITE_OTHER): Payer: 59 | Admitting: Orthopaedic Surgery

## 2018-09-24 ENCOUNTER — Encounter: Payer: Self-pay | Admitting: Orthopaedic Surgery

## 2018-09-24 DIAGNOSIS — M1612 Unilateral primary osteoarthritis, left hip: Secondary | ICD-10-CM

## 2018-09-24 MED ORDER — DICLOFENAC SODIUM 75 MG PO TBEC: 75.0000 mg | DELAYED_RELEASE_TABLET | Freq: Two times a day (BID) | ORAL | 1 refills | Status: DC

## 2018-09-24 NOTE — Progress Notes (Signed)
Office Visit Note   Patient: Marc [name]           Date of Birth: 1956-02-28           MRN: [id number] Visit Date: 09/24/2018              Requested by: No referring provider defined for this encounter. PCP: System, Pcp Not In   Assessment & Plan: Visit Diagnoses:  1. Primary osteoarthritis of left hip     Plan: Patient will follow-up with [place] on an as needed basis.  Discussed with him different anti-inflammatories type this could cause GI bleed, hypertension or also could affect his kidneys.  He has a visit with his primary care visit coming up soon and will do lab work.  He will stop his Advil and go on diclofenac 75 mg once daily up to twice daily if needed.  He understands that he did have a left hip injection in July if need be.  Ultimately he understands the most likely will need a left total hip arthroplasty however this will be when all conservative measures fail.  He is talking about most likely having a hip replacement next year.  Follow-Up Instructions: Return if symptoms worsen or fail to improve.   Orders:  No orders of the defined types were placed in this encounter.  Meds ordered this encounter  Medications  . diclofenac (VOLTAREN) 75 MG EC tablet    Sig: Take 1 tablet (75 mg total) by mouth 2 (two) times daily.    Dispense:  60 tablet    Refill:  1      Procedures: No procedures performed   Clinical Data: No additional findings.   Subjective: Chief Complaint  Patient presents with  . Left Hip - Follow-up    HPI Marc [name] returns today for follow-up for his left hip.  He underwent an intra-articular injection 04/29/2018 by Marc [name].  He states about 80% of his pain is gone.  He states rest of the pain is controlled with Advil however he is concerned because he is taking 800 mg 3 times a day.  He has had no stomach upset.  He states blood pressures overall under good control.  Is using no assistive device to ambulate. Review of Systems Denies any  fevers chills.  Please see HPI otherwise negative or noncontributory.  Objective: Vital Signs: There were no vitals taken for this visit.  Physical Exam Constitutional:      Appearance: He is not ill-appearing or diaphoretic.  Pulmonary:     Effort: Pulmonary effort is normal.  Neurological:     Mental Status: He is alert and oriented to person, place, and time.     Ortho Exam Left hip he has limited internal and external rotation with discomfort.  Right hip full fluid motion without pain.  Ambulates without any assistive device. Specialty Comments:  No specialty comments available.  Imaging: No results found.   PMFS History: There are no active problems to display for this patient.  Past Medical History:  Diagnosis Date  . Hyperlipidemia     History reviewed. No pertinent family history.  Past Surgical History:  Procedure Laterality Date  . right shoulder rotator cuff repair Right    Social History   Occupational History  . Not on file  Tobacco Use  . Smoking status: Current Some Day Smoker  . Smokeless tobacco: Current User  Substance and Sexual Activity  . Alcohol use: No  . Drug use: No  .  Sexual activity: Not on file

## 2018-09-25 ENCOUNTER — Encounter: Payer: Self-pay | Admitting: Physical Therapy

## 2018-09-25 ENCOUNTER — Ambulatory Visit: Payer: 59 | Admitting: Physical Therapy

## 2018-09-25 DIAGNOSIS — M25652 Stiffness of left hip, not elsewhere classified: Secondary | ICD-10-CM

## 2018-09-25 DIAGNOSIS — R262 Difficulty in walking, not elsewhere classified: Secondary | ICD-10-CM

## 2018-09-25 DIAGNOSIS — M25552 Pain in left hip: Secondary | ICD-10-CM | POA: Diagnosis not present

## 2018-09-25 NOTE — Therapy (Signed)
[place], [place], [address] Phone: [phone]   Fax:  [phone]  Physical Therapy Treatment/Discharge Summary  Patient Details  Name: Marc [name] MRN: [id number] Date of Birth: [birth year] Referring Provider (PT): Jean Rosenthal, MD   Encounter Date: 09/25/2018  PT End of Session - 09/25/18 1454    Visit Number  15    Number of Visits  22    Date for PT Re-Evaluation  09/24/18    Authorization Type  UHC-23 visit limit    PT Start Time  3570    PT Stop Time  1536    PT Time Calculation (min)  39 min    Activity Tolerance  Patient tolerated treatment well    Behavior During Therapy  [facility] for tasks assessed/performed       Past Medical History:  Diagnosis Date  . Hyperlipidemia     Past Surgical History:  Procedure Laterality Date  . right shoulder rotator cuff repair Right     There were no vitals filed for this visit.  Subjective Assessment - 09/25/18 1456    Subjective  Pt. had follow up with Dr. Ninfa Linden yesterday. Medication updated (Voltaren) in order for pt. to avoid having to take so much ibuprofen and he reports this is helping with pain-no pain pre-tx. Discussed status and pt. in agreement with plans to d/c to HEP and follow up with MD as needed.    Currently in Pain?  No/denies         [facility] PT Assessment - 09/25/18 0001      Observation/Other Assessments   Focus on Therapeutic Outcomes (FOTO)   25% limited      AROM   Left Hip Flexion  105    Left Hip External Rotation   45    Left Hip Internal Rotation   10    Left Hip ABduction  45      Strength   Overall Strength Comments  Left hip strength grossly 5/5                   [facility] Adult PT Treatment/Exercise - 09/25/18 0001      Knee/Hip Exercises: Stretches   Hip Flexor Stretch  Left;3 reps;30 seconds    Hip Flexor Stretch Limitations  supine manual stretch    Piriformis Stretch  Left;3 reps;30  seconds    Piriformis Stretch Limitations  supine manual stretch    Other Knee/Hip Stretches  left adductor manual stretch 3x30 sec      Manual Therapy   Joint Mobilization  Left hip long axis distraction, supine hip flex, ER mobs with belt, IR AP mobs, prone PA mobs grade III-IV    Passive ROM  left hip all directions             PT Education - 09/25/18 1543    Education Details  POC, HEP    Person(s) Educated  Patient    Methods  Explanation;Demonstration    Comprehension  Verbalized understanding          PT Long Term Goals - 09/25/18 1545      PT LONG TERM GOAL #1   Title  Independent with HEP    Baseline  met    Time  6    Period  Weeks    Status  Achieved      PT LONG TERM GOAL #2   Title  Improve FOTO score to 32% or less impairment    Baseline  25% limited    Time  6    Period  Weeks    Status  Achieved      PT LONG TERM GOAL #3   Title  Increase left hip flexion AROM 10 deg or greater to improve ability to donn shoes and perform transfers from low seating    Baseline  105 deg    Time  6    Period  Weeks    Status  Not Met      PT LONG TERM GOAL #4   Title  Left hip strength grossly 5/5 to improve stability for prolonged ambulation/gait over uneven surfaces and squatting motions for IADLs    Baseline  5/5    Time  6    Period  Weeks    Status  Achieved            Plan - 09/25/18 1544    Clinical Impression Statement  Pt. continues with left hip stiffness but otherwise has met therapy goals. Pain level currently tolerable so plan is for pt. to continue with HEP and medication as prescribed by MD and follow up with MD with any future changes in status/experiencing further setback in symptoms.    Personal Factors and Comorbidities  Time since onset of injury/illness/exacerbation    Examination-Activity Limitations  Locomotion Level;Stand;Stairs;Dressing    Rehab Potential  Good    PT Frequency  --   1-2x/week   PT Treatment/Interventions   Dry needling;Manual techniques;Joint Manipulations;Taping;ADLs/Self Care Home Management;Cryotherapy;Ultrasound;Moist Heat;Iontophoresis 46m/ml Dexamethasone;Electrical Stimulation;Therapeutic activities;Therapeutic exercise;Neuromuscular re-education;Patient/family education;Passive range of motion;Gait training    PT Next Visit Plan  NA-d/c to HEP    PT Home Exercise Plan  see handout    Consulted and Agree with Plan of Care  Patient       Patient will benefit from skilled therapeutic intervention in order to improve the following deficits and impairments:  Pain, Hypomobility, Decreased strength, Decreased range of motion, Decreased activity tolerance, Impaired flexibility, Difficulty walking  Visit Diagnosis: 1. Pain in left hip   2. Stiffness of left hip, not elsewhere classified   3. Difficulty in walking, not elsewhere classified        Problem List There are no active problems to display for this patient.    PHYSICAL THERAPY DISCHARGE SUMMARY  Visits from Start of Care: 15  Current functional level related to goals / functional outcomes: Still with hip stiffness but otherwise therapy goals met, pain minimal   Remaining deficits: Capsular restriction of left hip ROM   Education / Equipment: HEP Plan: Patient agrees to discharge.  Patient goals were partially met. Patient is being discharged due to meeting the stated rehab goals.  ?????          CBeaulah Dinning PT, DPT 09/25/18 3:48 PM     [facility] [place] [address]Phone: [phone]  Fax:  [phone] Name: Marc GilhamMRN: [id number] of Birth: 504/16/1957

## 2018-11-15 ENCOUNTER — Other Ambulatory Visit: Payer: Self-pay | Admitting: Physician Assistant

## 2019-01-14 ENCOUNTER — Other Ambulatory Visit: Payer: Self-pay | Admitting: Orthopaedic Surgery

## 2019-03-23 ENCOUNTER — Other Ambulatory Visit: Payer: Self-pay

## 2019-03-23 MED ORDER — DICLOFENAC SODIUM 75 MG PO TBEC: 75.0000 mg | DELAYED_RELEASE_TABLET | Freq: Two times a day (BID) | ORAL | 1 refills | Status: DC

## 2019-05-17 ENCOUNTER — Other Ambulatory Visit: Payer: Self-pay | Admitting: Orthopaedic Surgery

## 2019-07-14 ENCOUNTER — Other Ambulatory Visit: Payer: Self-pay | Admitting: Orthopaedic Surgery

## 2019-07-14 NOTE — Telephone Encounter (Signed)
Notified patient.

## 2019-09-09 ENCOUNTER — Other Ambulatory Visit: Payer: Self-pay | Admitting: Orthopaedic Surgery

## 2019-10-09 ENCOUNTER — Other Ambulatory Visit: Payer: Self-pay | Admitting: Physician Assistant

## 2019-10-09 DIAGNOSIS — Z122 Encounter for screening for malignant neoplasm of respiratory organs: Secondary | ICD-10-CM

## 2019-10-23 ENCOUNTER — Other Ambulatory Visit: Payer: Self-pay

## 2019-10-23 ENCOUNTER — Ambulatory Visit
Admission: RE | Admit: 2019-10-23 | Discharge: 2019-10-23 | Disposition: A | Discharge status: Home / Self Care | Payer: 59 | Source: Ambulatory Visit | Attending: Physician Assistant | Admitting: Physician Assistant

## 2019-10-23 DIAGNOSIS — Z122 Encounter for screening for malignant neoplasm of respiratory organs: Secondary | ICD-10-CM

## 2019-11-02 ENCOUNTER — Other Ambulatory Visit: Payer: Self-pay | Admitting: Physician Assistant

## 2019-11-02 DIAGNOSIS — R9389 Abnormal findings on diagnostic imaging of other specified body structures: Secondary | ICD-10-CM

## 2019-11-08 ENCOUNTER — Other Ambulatory Visit: Payer: Self-pay | Admitting: Orthopaedic Surgery

## 2019-11-16 ENCOUNTER — Ambulatory Visit
Admission: RE | Admit: 2019-11-16 | Discharge: 2019-11-16 | Disposition: A | Discharge status: Home / Self Care | Payer: 59 | Source: Ambulatory Visit | Attending: Physician Assistant | Admitting: Physician Assistant

## 2019-11-16 DIAGNOSIS — R9389 Abnormal findings on diagnostic imaging of other specified body structures: Secondary | ICD-10-CM

## 2019-11-16 MED ORDER — IOPAMIDOL (ISOVUE-300) INJECTION 61%
100.0000 mL | Freq: Once | INTRAVENOUS | Status: AC | PRN
  Administered 2019-11-16: 100 mL via INTRAVENOUS

## 2019-12-01 ENCOUNTER — Other Ambulatory Visit: Payer: Self-pay | Admitting: Urology

## 2019-12-01 DIAGNOSIS — D49512 Neoplasm of unspecified behavior of left kidney: Secondary | ICD-10-CM

## 2019-12-25 NOTE — Progress Notes (Signed)
DUE TO COVID-19 ONLY ONE VISITOR IS ALLOWED TO COME WITH YOU AND STAY IN THE WAITING ROOM ONLY DURING PRE OP AND PROCEDURE DAY OF SURGERY. THE 1 VISITOR  MAY VISIT WITH YOU AFTER SURGERY IN YOUR PRIVATE ROOM DURING VISITING HOURS ONLY!  YOU NEED TO HAVE A COVID 19 TEST ON__10/1/21 _____ @_______ , THIS TEST MUST BE DONE BEFORE SURGERY,  COVID TESTING SITE [address] [place] [address], IT IS ON THE RIGHT GOING OUT [address] APPROXIMATELY  2 MINUTES PAST [employer] ON THE RIGHT. ONCE YOUR COVID TEST IS COMPLETED,  PLEASE BEGIN THE QUARANTINE INSTRUCTIONS AS OUTLINED IN YOUR HANDOUT.                Markeith Jue  12/25/2019   Your procedure is scheduled on: 01/05/2020    Report to [facility] Main  Entrance   Report to admitting at    [place] AM     Call this number if you have problems the morning of surgery [phone]    Remember: Do not eat food , candy gum or mints :After Midnight. You may have clear liquids from midnight until   0415am    CLEAR LIQUID DIET   Foods Allowed                                                                       Coffee and tea, regular and decaf                              Plain Jell-O any favor except red or purple                                            Fruit ices (not with fruit pulp)                                      Iced Popsicles                                     Carbonated beverages, regular and diet                                    Cranberry, grape and apple juices Sports drinks like Gatorade Lightly seasoned clear broth or consume(fat free) Sugar, honey syrup   _____________________________________________________________________    BRUSH YOUR TEETH MORNING OF SURGERY AND RINSE YOUR MOUTH OUT, NO CHEWING GUM CANDY OR MINTS.     Take these medicines the morning of surgery with A SIP OF WATER: none   DO NOT TAKE ANY DIABETIC MEDICATIONS DAY OF YOUR SURGERY                                You may not have any metal on your  body including hair pins and              piercings  Do not wear jewelry, make-up, lotions, powders or perfumes, deodorant             Do not wear nail polish on your fingernails.  Do not shave  48 hours prior to surgery.              Men may shave face and neck.   Do not bring valuables to the hospital. [place].  Contacts, dentures or bridgework may not be worn into surgery.  Leave suitcase in the car. After surgery it may be brought to your room.     Patients discharged the day of surgery will not be allowed to drive home. IF YOU ARE HAVING SURGERY AND GOING HOME THE SAME DAY, YOU MUST HAVE AN ADULT TO DRIVE YOU HOME AND BE WITH YOU FOR 24 HOURS. YOU MAY GO HOME BY TAXI OR UBER OR ORTHERWISE, BUT AN ADULT MUST ACCOMPANY YOU HOME AND STAY WITH YOU FOR 24 HOURS.  Name and phone number of your driver:  Special Instructions: N/A              Please read over the following fact sheets you were given: _____________________________________________________________________  [facility] - Preparing for Surgery Before surgery, you can play an important role.  Because skin is not sterile, your skin needs to be as free of germs as possible.  You can reduce the number of germs on your skin by washing with CHG (chlorahexidine gluconate) soap before surgery.  CHG is an antiseptic cleaner which kills germs and bonds with the skin to continue killing germs even after washing. Please DO NOT use if you have an allergy to CHG or antibacterial soaps.  If your skin becomes reddened/irritated stop using the CHG and inform your nurse when you arrive at Short Stay. Do not shave (including legs and underarms) for at least 48 hours prior to the first CHG shower.  You may shave your face/neck. Please follow these instructions carefully:  1.  Shower with CHG Soap the night before surgery and the  morning of Surgery.  2.  If you choose  to wash your hair, wash your hair first as usual with your  normal  shampoo.  3.  After you shampoo, rinse your hair and body thoroughly to remove the  shampoo.                           4.  Use CHG as you would any other liquid soap.  You can apply chg directly  to the skin and wash                       Gently with a scrungie or clean washcloth.  5.  Apply the CHG Soap to your body ONLY FROM THE NECK DOWN.   Do not use on face/ open                           Wound or open sores. Avoid contact with eyes, ears mouth and genitals (private parts).                       Wash face,  Development worker, international aid (private  parts) with your normal soap.             6.  Wash thoroughly, paying special attention to the area where your surgery  will be performed.  7.  Thoroughly rinse your body with warm water from the neck down.  8.  DO NOT shower/wash with your normal soap after using and rinsing off  the CHG Soap.                9.  Pat yourself dry with a clean towel.            10.  Wear clean pajamas.            11.  Place clean sheets on your bed the night of your first shower and do not  sleep with pets. Day of Surgery : Do not apply any lotions/deodorants the morning of surgery.  Please wear clean clothes to the hospital/surgery center.  FAILURE TO FOLLOW THESE INSTRUCTIONS MAY RESULT IN THE CANCELLATION OF YOUR SURGERY PATIENT SIGNATURE_________________________________  NURSE SIGNATURE__________________________________  ________________________________________________________________________

## 2019-12-26 ENCOUNTER — Ambulatory Visit
Admission: RE | Admit: 2019-12-26 | Discharge: 2019-12-26 | Disposition: A | Discharge status: Home / Self Care | Payer: 59 | Source: Ambulatory Visit | Attending: Urology | Admitting: Urology

## 2019-12-26 DIAGNOSIS — D49512 Neoplasm of unspecified behavior of left kidney: Secondary | ICD-10-CM

## 2019-12-26 MED ORDER — GADOBENATE DIMEGLUMINE 529 MG/ML IV SOLN
20.0000 mL | Freq: Once | INTRAVENOUS | Status: AC | PRN
  Administered 2019-12-26: 20 mL via INTRAVENOUS

## 2019-12-28 ENCOUNTER — Other Ambulatory Visit: Payer: Self-pay | Admitting: Urology

## 2019-12-28 ENCOUNTER — Encounter (HOSPITAL_COMMUNITY): Payer: Self-pay

## 2019-12-28 ENCOUNTER — Other Ambulatory Visit: Payer: Self-pay

## 2019-12-28 ENCOUNTER — Encounter (HOSPITAL_COMMUNITY)
Admission: RE | Admit: 2019-12-28 | Discharge: 2019-12-28 | Disposition: A | Discharge status: Home / Self Care | Payer: 59 | Source: Ambulatory Visit | Attending: Urology | Admitting: Urology

## 2019-12-28 DIAGNOSIS — Z01812 Encounter for preprocedural laboratory examination: Secondary | ICD-10-CM | POA: Insufficient documentation

## 2019-12-28 LAB — BASIC METABOLIC PANEL
Anion gap: 8 (ref 5–15)
BUN: 17 mg/dL (ref 8–23)
CO2: 26 mmol/L (ref 22–32)
Calcium: 9.2 mg/dL (ref 8.9–10.3)
Chloride: 104 mmol/L (ref 98–111)
Creatinine, Ser: 0.77 mg/dL (ref 0.61–1.24)
GFR calc Af Amer: >60 mL/min (ref >60)
GFR calc non Af Amer: >60 mL/min (ref >60)
Glucose, Bld: 106 mg/dL — ABNORMAL HIGH (ref 70–99)
Potassium: 4.4 mmol/L (ref 3.5–5.1)
Sodium: 138 mmol/L (ref 135–145)

## 2019-12-28 LAB — CBC
HCT: 39.3 % (ref 39.0–52.0)
Hemoglobin: 12.8 g/dL — ABNORMAL LOW (ref 13.0–17.0)
MCH: 28.6 pg (ref 26.0–34.0)
MCHC: 32.6 g/dL (ref 30.0–36.0)
MCV: 87.7 fL (ref 80.0–100.0)
Platelets: 332 10*3/uL (ref 150–400)
RBC: 4.48 MIL/uL (ref 4.22–5.81)
RDW: 14.2 % (ref 11.5–15.5)
WBC: 9.7 10*3/uL (ref 4.0–10.5)
nRBC: 0 % (ref 0.0–0.2)

## 2019-12-28 NOTE — Progress Notes (Signed)
Requested orders for epic.  LVMM for Marc [name].

## 2019-12-31 ENCOUNTER — Other Ambulatory Visit: Payer: Self-pay | Admitting: Orthopaedic Surgery

## 2020-01-01 ENCOUNTER — Other Ambulatory Visit (HOSPITAL_COMMUNITY)
Admission: RE | Admit: 2020-01-01 | Discharge: 2020-01-01 | Disposition: A | Discharge status: Home / Self Care | Payer: 59 | Source: Ambulatory Visit | Attending: Urology | Admitting: Urology

## 2020-01-01 DIAGNOSIS — Z01812 Encounter for preprocedural laboratory examination: Secondary | ICD-10-CM | POA: Insufficient documentation

## 2020-01-01 DIAGNOSIS — Z20822 Contact with and (suspected) exposure to covid-19: Secondary | ICD-10-CM | POA: Diagnosis not present

## 2020-01-01 LAB — SARS CORONAVIRUS 2 (TAT 6-24 HRS): SARS Coronavirus 2: NEGATIVE

## 2020-01-04 NOTE — Anesthesia Preprocedure Evaluation (Addendum)
Anesthesia Evaluation  Patient identified by MRN, date of birth, ID band Patient awake    Reviewed: Allergy & Precautions, NPO status , Patient's Chart, lab work & pertinent test results  Airway Mallampati: II  TM Distance: >3 FB Neck ROM: Full    Dental no notable dental hx.    Pulmonary neg pulmonary ROS, former smoker,    Pulmonary exam normal breath sounds clear to auscultation       Cardiovascular negative cardio ROS Normal cardiovascular exam Rhythm:Regular Rate:Normal     Neuro/Psych negative neurological ROS  negative psych ROS   GI/Hepatic negative GI ROS, Neg liver ROS,   Endo/Other  negative endocrine ROS  Renal/GU negative Renal ROS  negative genitourinary   Musculoskeletal negative musculoskeletal ROS (+)   Abdominal   Peds negative pediatric ROS (+)  Hematology negative hematology ROS (+)   Anesthesia Other Findings   Reproductive/Obstetrics negative OB ROS                            Anesthesia Physical Anesthesia Plan  ASA: II  Anesthesia Plan: General   Post-op Pain Management:    Induction: Intravenous  PONV Risk Score and Plan: 2 and Ondansetron and Dexamethasone  Airway Management Planned: Oral ETT  Additional Equipment:   Intra-op Plan:   Post-operative Plan: Extubation in OR  Informed Consent: I have reviewed the patients History and Physical, chart, labs and discussed the procedure including the risks, benefits and alternatives for the proposed anesthesia with the patient or authorized representative who has indicated his/her understanding and acceptance.     Dental advisory given  Plan Discussed with: CRNA and Surgeon  Anesthesia Plan Comments:         Anesthesia Quick Evaluation

## 2020-01-05 ENCOUNTER — Ambulatory Visit (HOSPITAL_COMMUNITY): Payer: 59 | Admitting: Anesthesiology

## 2020-01-05 ENCOUNTER — Encounter (HOSPITAL_COMMUNITY)
Admission: AD | Disposition: A | Discharge status: Home / Self Care | Payer: Self-pay | Source: Other Acute Inpatient Hospital | Attending: Urology

## 2020-01-05 ENCOUNTER — Inpatient Hospital Stay (HOSPITAL_COMMUNITY)
Admission: AD | Admit: 2020-01-05 | Discharge: 2020-01-06 | DRG: 658 | Disposition: A | Discharge status: Home / Self Care | Payer: 59 | Source: Other Acute Inpatient Hospital | Attending: Urology | Admitting: Urology

## 2020-01-05 ENCOUNTER — Encounter (HOSPITAL_COMMUNITY): Payer: Self-pay | Admitting: Urology

## 2020-01-05 ENCOUNTER — Other Ambulatory Visit: Payer: Self-pay

## 2020-01-05 DIAGNOSIS — Z87891 Personal history of nicotine dependence: Secondary | ICD-10-CM

## 2020-01-05 DIAGNOSIS — N2889 Other specified disorders of kidney and ureter: Secondary | ICD-10-CM | POA: Diagnosis present

## 2020-01-05 DIAGNOSIS — Z20822 Contact with and (suspected) exposure to covid-19: Secondary | ICD-10-CM | POA: Diagnosis present

## 2020-01-05 DIAGNOSIS — C642 Malignant neoplasm of left kidney, except renal pelvis: Secondary | ICD-10-CM | POA: Diagnosis present

## 2020-01-05 DIAGNOSIS — E785 Hyperlipidemia, unspecified: Secondary | ICD-10-CM | POA: Diagnosis present

## 2020-01-05 HISTORY — PX: ROBOT ASSISTED LAPAROSCOPIC NEPHRECTOMY: SHX5140

## 2020-01-05 LAB — COMPREHENSIVE METABOLIC PANEL
ALT: 30 U/L (ref 0–44)
AST: 22 U/L (ref 15–41)
Albumin: 4.2 g/dL (ref 3.5–5.0)
Alkaline Phosphatase: 103 U/L (ref 38–126)
Anion gap: 10 (ref 5–15)
BUN: 12 mg/dL (ref 8–23)
CO2: 24 mmol/L (ref 22–32)
Calcium: 9.2 mg/dL (ref 8.9–10.3)
Chloride: 104 mmol/L (ref 98–111)
Creatinine, Ser: 0.84 mg/dL (ref 0.61–1.24)
GFR calc Af Amer: >60 mL/min (ref >60)
GFR calc non Af Amer: >60 mL/min (ref >60)
Glucose, Bld: 106 mg/dL — ABNORMAL HIGH (ref 70–99)
Potassium: 3.7 mmol/L (ref 3.5–5.1)
Sodium: 138 mmol/L (ref 135–145)
Total Bilirubin: 0.9 mg/dL (ref 0.3–1.2)
Total Protein: 7.6 g/dL (ref 6.5–8.1)

## 2020-01-05 LAB — HEMOGLOBIN AND HEMATOCRIT, BLOOD
HCT: 37.8 % — ABNORMAL LOW (ref 39.0–52.0)
Hemoglobin: 12.3 g/dL — ABNORMAL LOW (ref 13.0–17.0)

## 2020-01-05 LAB — TYPE AND SCREEN
ABO/RH(D): A POS
Antibody Screen: NEGATIVE

## 2020-01-05 LAB — ABO/RH: ABO/RH(D): A POS

## 2020-01-05 SURGERY — NEPHRECTOMY, RADICAL, ROBOT-ASSISTED, LAPAROSCOPIC, ADULT
Anesthesia: General | Laterality: Left

## 2020-01-05 MED ORDER — PROMETHAZINE HCL 12.5 MG PO TABS: 12.5000 mg | ORAL_TABLET | ORAL | 0 refills | Status: DC | PRN

## 2020-01-05 MED ORDER — SODIUM CHLORIDE (PF) 0.9 % IJ SOLN
INTRAMUSCULAR | Status: DC | PRN
  Administered 2020-01-05: 20 mL

## 2020-01-05 MED ORDER — DEXAMETHASONE SODIUM PHOSPHATE 10 MG/ML IJ SOLN
INTRAMUSCULAR | Status: AC
  Filled 2020-01-05: qty 1

## 2020-01-05 MED ORDER — BELLADONNA ALKALOIDS-OPIUM 16.2-60 MG RE SUPP: 1.0000 | Freq: Four times a day (QID) | RECTAL | Status: DC | PRN

## 2020-01-05 MED ORDER — LACTATED RINGERS IV SOLN: INTRAVENOUS | Status: DC

## 2020-01-05 MED ORDER — FENTANYL CITRATE (PF) 250 MCG/5ML IJ SOLN
INTRAMUSCULAR | Status: DC | PRN
Start: 2020-01-05 — End: 2020-01-05
  Administered 2020-01-05 (×2): 100 ug via INTRAVENOUS
  Administered 2020-01-05: 50 ug via INTRAVENOUS
  Administered 2020-01-05: 100 ug via INTRAVENOUS

## 2020-01-05 MED ORDER — ONDANSETRON HCL 4 MG/2ML IJ SOLN: 4.0000 mg | INTRAMUSCULAR | Status: DC | PRN

## 2020-01-05 MED ORDER — ATORVASTATIN CALCIUM 10 MG PO TABS
10.0000 mg | ORAL_TABLET | Freq: Every day | ORAL | Status: DC
  Administered 2020-01-05: 10 mg via ORAL
  Filled 2020-01-05: qty 1

## 2020-01-05 MED ORDER — ACETAMINOPHEN 325 MG PO TABS
650.0000 mg | ORAL_TABLET | ORAL | Status: DC | PRN
  Administered 2020-01-06: 650 mg via ORAL
  Filled 2020-01-05: qty 2

## 2020-01-05 MED ORDER — HYDROMORPHONE HCL 1 MG/ML IJ SOLN: 0.2500 mg | INTRAMUSCULAR | Status: DC | PRN

## 2020-01-05 MED ORDER — FENTANYL CITRATE (PF) 250 MCG/5ML IJ SOLN
INTRAMUSCULAR | Status: AC
  Filled 2020-01-05: qty 5

## 2020-01-05 MED ORDER — BACITRACIN-NEOMYCIN-POLYMYXIN 400-5-5000 EX OINT: 1.0000 "application " | TOPICAL_OINTMENT | Freq: Three times a day (TID) | CUTANEOUS | Status: DC | PRN

## 2020-01-05 MED ORDER — DIPHENHYDRAMINE HCL 50 MG/ML IJ SOLN: 12.5000 mg | Freq: Four times a day (QID) | INTRAMUSCULAR | Status: DC | PRN

## 2020-01-05 MED ORDER — CHLORHEXIDINE GLUCONATE 0.12 % MT SOLN
15.0000 mL | Freq: Once | OROMUCOSAL | Status: AC
  Administered 2020-01-05: 15 mL via OROMUCOSAL

## 2020-01-05 MED ORDER — LIDOCAINE 2% (20 MG/ML) 5 ML SYRINGE
INTRAMUSCULAR | Status: AC
  Filled 2020-01-05: qty 5

## 2020-01-05 MED ORDER — MIDAZOLAM HCL 5 MG/5ML IJ SOLN
INTRAMUSCULAR | Status: DC | PRN
  Administered 2020-01-05: 2 mg via INTRAVENOUS

## 2020-01-05 MED ORDER — HYDROMORPHONE HCL 2 MG/ML IJ SOLN
INTRAMUSCULAR | Status: AC
  Filled 2020-01-05: qty 1

## 2020-01-05 MED ORDER — SODIUM CHLORIDE (PF) 0.9 % IJ SOLN
INTRAMUSCULAR | Status: AC
  Filled 2020-01-05: qty 10

## 2020-01-05 MED ORDER — CEFAZOLIN SODIUM-DEXTROSE 2-4 GM/100ML-% IV SOLN
2.0000 g | INTRAVENOUS | Status: AC
  Administered 2020-01-05: 2 g via INTRAVENOUS
  Filled 2020-01-05: qty 100

## 2020-01-05 MED ORDER — ONDANSETRON HCL 4 MG/2ML IJ SOLN
INTRAMUSCULAR | Status: AC
  Filled 2020-01-05: qty 2

## 2020-01-05 MED ORDER — SODIUM CHLORIDE (PF) 0.9 % IJ SOLN
INTRAMUSCULAR | Status: AC
  Filled 2020-01-05: qty 20

## 2020-01-05 MED ORDER — DEXAMETHASONE SODIUM PHOSPHATE 10 MG/ML IJ SOLN
INTRAMUSCULAR | Status: DC | PRN
  Administered 2020-01-05: 10 mg via INTRAVENOUS

## 2020-01-05 MED ORDER — SENNOSIDES-DOCUSATE SODIUM 8.6-50 MG PO TABS
2.0000 | ORAL_TABLET | Freq: Every day | ORAL | Status: DC
  Administered 2020-01-05: 2 via ORAL
  Filled 2020-01-05: qty 2

## 2020-01-05 MED ORDER — ACETAMINOPHEN 10 MG/ML IV SOLN: 1000.0000 mg | Freq: Once | INTRAVENOUS | Status: DC | PRN

## 2020-01-05 MED ORDER — ORAL CARE MOUTH RINSE: 15.0000 mL | Freq: Once | OROMUCOSAL | Status: AC

## 2020-01-05 MED ORDER — DIPHENHYDRAMINE HCL 12.5 MG/5ML PO ELIX: 12.5000 mg | ORAL_SOLUTION | Freq: Four times a day (QID) | ORAL | Status: DC | PRN

## 2020-01-05 MED ORDER — CEFAZOLIN SODIUM-DEXTROSE 1-4 GM/50ML-% IV SOLN
1.0000 g | Freq: Three times a day (TID) | INTRAVENOUS | Status: AC
  Administered 2020-01-05 – 2020-01-06 (×2): 1 g via INTRAVENOUS
  Filled 2020-01-05 (×3): qty 50

## 2020-01-05 MED ORDER — MIDAZOLAM HCL 2 MG/2ML IJ SOLN
INTRAMUSCULAR | Status: AC
  Filled 2020-01-05: qty 2

## 2020-01-05 MED ORDER — HYDROMORPHONE HCL 1 MG/ML IJ SOLN
INTRAMUSCULAR | Status: DC | PRN
  Administered 2020-01-05 (×4): .5 mg via INTRAVENOUS

## 2020-01-05 MED ORDER — LACTATED RINGERS IR SOLN
Status: DC | PRN
  Administered 2020-01-05: 1000 mL

## 2020-01-05 MED ORDER — OXYCODONE HCL 5 MG PO TABS
5.0000 mg | ORAL_TABLET | ORAL | Status: DC | PRN
  Administered 2020-01-06 (×2): 5 mg via ORAL
  Filled 2020-01-05 (×2): qty 1

## 2020-01-05 MED ORDER — ONDANSETRON HCL 4 MG/2ML IJ SOLN
INTRAMUSCULAR | Status: DC | PRN
  Administered 2020-01-05: 4 mg via INTRAVENOUS

## 2020-01-05 MED ORDER — STERILE WATER FOR IRRIGATION IR SOLN
Status: DC | PRN
  Administered 2020-01-05: 1000 mL

## 2020-01-05 MED ORDER — SUGAMMADEX SODIUM 200 MG/2ML IV SOLN
INTRAVENOUS | Status: DC | PRN
  Administered 2020-01-05: 200 mg via INTRAVENOUS

## 2020-01-05 MED ORDER — ROCURONIUM BROMIDE 10 MG/ML (PF) SYRINGE
PREFILLED_SYRINGE | INTRAVENOUS | Status: DC | PRN
  Administered 2020-01-05 (×2): 30 mg via INTRAVENOUS
  Administered 2020-01-05: 70 mg via INTRAVENOUS
  Administered 2020-01-05: 30 mg via INTRAVENOUS
  Administered 2020-01-05: 10 mg via INTRAVENOUS

## 2020-01-05 MED ORDER — LIDOCAINE 2% (20 MG/ML) 5 ML SYRINGE
INTRAMUSCULAR | Status: DC | PRN
  Administered 2020-01-05: 60 mg via INTRAVENOUS

## 2020-01-05 MED ORDER — BUPIVACAINE LIPOSOME 1.3 % IJ SUSP
20.0000 mL | Freq: Once | INTRAMUSCULAR | Status: AC
  Administered 2020-01-05: 20 mL
  Filled 2020-01-05: qty 20

## 2020-01-05 MED ORDER — HYDROMORPHONE HCL 1 MG/ML IJ SOLN: 0.5000 mg | INTRAMUSCULAR | Status: DC | PRN

## 2020-01-05 MED ORDER — DOCUSATE SODIUM 100 MG PO CAPS: 100.0000 mg | ORAL_CAPSULE | Freq: Two times a day (BID) | ORAL | Status: DC

## 2020-01-05 MED ORDER — FENTANYL CITRATE (PF) 100 MCG/2ML IJ SOLN
INTRAMUSCULAR | Status: AC
  Filled 2020-01-05: qty 2

## 2020-01-05 MED ORDER — DEXTROSE-NACL 5-0.45 % IV SOLN: INTRAVENOUS | Status: DC

## 2020-01-05 MED ORDER — HYDROCODONE-ACETAMINOPHEN 5-325 MG PO TABS: 1.0000 | ORAL_TABLET | Freq: Four times a day (QID) | ORAL | 0 refills | Status: DC | PRN

## 2020-01-05 MED ORDER — PROPOFOL 10 MG/ML IV BOLUS
INTRAVENOUS | Status: AC
  Filled 2020-01-05: qty 20

## 2020-01-05 MED ORDER — ROCURONIUM BROMIDE 10 MG/ML (PF) SYRINGE
PREFILLED_SYRINGE | INTRAVENOUS | Status: AC
  Filled 2020-01-05: qty 10

## 2020-01-05 MED ORDER — PROPOFOL 10 MG/ML IV BOLUS
INTRAVENOUS | Status: DC | PRN
  Administered 2020-01-05: 200 mg via INTRAVENOUS

## 2020-01-05 SURGICAL SUPPLY — 53 items
BAG LAPAROSCOPIC 12 15 PORT 16 (BASKET) ×1 IMPLANT
BAG RETRIEVAL 12/15 (BASKET) ×2
CHLORAPREP W/TINT 26 (MISCELLANEOUS) ×2 IMPLANT
CLIP VESOLOCK LG 6/CT PURPLE (CLIP) ×6 IMPLANT
CLIP VESOLOCK MED LG 6/CT (CLIP) ×4 IMPLANT
CLIP VESOLOCK XL 6/CT (CLIP) ×4 IMPLANT
COVER SURGICAL LIGHT HANDLE (MISCELLANEOUS) ×2 IMPLANT
COVER TIP SHEARS 8 DVNC (MISCELLANEOUS) ×1 IMPLANT
COVER TIP SHEARS 8MM DA VINCI (MISCELLANEOUS) ×1
COVER WAND RF STERILE (DRAPES) IMPLANT
CUTTER ECHEON FLEX ENDO 45 340 (ENDOMECHANICALS) ×2 IMPLANT
DECANTER SPIKE VIAL GLASS SM (MISCELLANEOUS) ×2 IMPLANT
DERMABOND ADVANCED (GAUZE/BANDAGES/DRESSINGS) ×1
DERMABOND ADVANCED .7 DNX12 (GAUZE/BANDAGES/DRESSINGS) ×1 IMPLANT
DRAPE ARM DVNC X/XI (DISPOSABLE) ×4 IMPLANT
DRAPE COLUMN DVNC XI (DISPOSABLE) ×1 IMPLANT
DRAPE DA VINCI XI ARM (DISPOSABLE) ×4
DRAPE DA VINCI XI COLUMN (DISPOSABLE) ×1
DRAPE INCISE IOBAN 66X45 STRL (DRAPES) ×2 IMPLANT
DRAPE SHEET LG 3/4 BI-LAMINATE (DRAPES) ×2 IMPLANT
ELECT REM PT RETURN 15FT ADLT (MISCELLANEOUS) ×2 IMPLANT
GLOVE BIO SURGEON STRL SZ 6.5 (GLOVE) ×2 IMPLANT
GLOVE BIOGEL M STRL SZ7.5 (GLOVE) ×4 IMPLANT
GOWN STRL REUS W/TWL LRG LVL3 (GOWN DISPOSABLE) ×2 IMPLANT
GOWN STRL REUS W/TWL XL LVL3 (GOWN DISPOSABLE) ×4 IMPLANT
HEMOSTAT SURGICEL 4X8 (HEMOSTASIS) ×2 IMPLANT
IRRIG SUCT STRYKERFLOW 2 WTIP (MISCELLANEOUS) ×2
IRRIGATION SUCT STRKRFLW 2 WTP (MISCELLANEOUS) ×1 IMPLANT
KIT BASIN OR (CUSTOM PROCEDURE TRAY) ×2 IMPLANT
KIT TURNOVER KIT A (KITS) IMPLANT
NEEDLE INSUFFLATION 14GA 120MM (NEEDLE) ×2 IMPLANT
NS IRRIG 1000ML POUR BTL (IV SOLUTION) ×2 IMPLANT
PENCIL SMOKE EVACUATOR (MISCELLANEOUS) IMPLANT
POUCH SPECIMEN RETRIEVAL 10MM (ENDOMECHANICALS) ×2 IMPLANT
PROTECTOR NERVE ULNAR (MISCELLANEOUS) ×4 IMPLANT
SEAL CANN UNIV 5-8 DVNC XI (MISCELLANEOUS) ×4 IMPLANT
SEAL XI 5MM-8MM UNIVERSAL (MISCELLANEOUS) ×4
SET TUBE SMOKE EVAC HIGH FLOW (TUBING) ×2 IMPLANT
SOLUTION ELECTROLUBE (MISCELLANEOUS) ×2 IMPLANT
STAPLE RELOAD 45 WHT (STAPLE) ×2 IMPLANT
STAPLE RELOAD 45MM WHITE (STAPLE) ×2
SUT MNCRL AB 4-0 PS2 18 (SUTURE) ×4 IMPLANT
SUT PDS AB 0 CT1 36 (SUTURE) ×4 IMPLANT
SUT VIC AB 2-0 CT1 27 (SUTURE) ×1
SUT VIC AB 2-0 CT1 TAPERPNT 27 (SUTURE) ×1 IMPLANT
SUT VICRYL 0 UR6 27IN ABS (SUTURE) ×2 IMPLANT
TOWEL OR 17X26 10 PK STRL BLUE (TOWEL DISPOSABLE) ×2 IMPLANT
TOWEL OR NON WOVEN STRL DISP B (DISPOSABLE) ×2 IMPLANT
TRAY FOLEY MTR SLVR 16FR STAT (SET/KITS/TRAYS/PACK) ×2 IMPLANT
TRAY LAPAROSCOPIC (CUSTOM PROCEDURE TRAY) ×2 IMPLANT
TROCAR BLADELESS OPT 5 100 (ENDOMECHANICALS) IMPLANT
TROCAR XCEL 12X100 BLDLESS (ENDOMECHANICALS) ×2 IMPLANT
WATER STERILE IRR 1000ML POUR (IV SOLUTION) ×2 IMPLANT

## 2020-01-05 NOTE — Transfer of Care (Signed)
Immediate Anesthesia Transfer of Care Note  Patient: Marc [name]  Procedure(s) Performed: XI ROBOTIC ASSISTED LAPAROSCOPIC RADICAL NEPHRECTOMY (Left )  Patient Location: PACU  Anesthesia Type:General  Level of Consciousness: awake and responds to stimulation  Airway & Oxygen Therapy: Patient Spontanous Breathing and Patient connected to face mask oxygen  Post-op Assessment: Report given to RN and Post -op Vital signs reviewed and stable  Post vital signs: Reviewed and stable  Last Vitals:  Vitals Value Taken Time  BP 169/93 01/05/20 1100  Temp    Pulse 78 01/05/20 1100  Resp 18 01/05/20 1102  SpO2 100 % 01/05/20 1100  Vitals shown include unvalidated device data.  Last Pain:  Vitals:   01/05/20 0620  TempSrc: Oral  PainSc:          Complications: No complications documented.

## 2020-01-05 NOTE — Anesthesia Postprocedure Evaluation (Signed)
Anesthesia Post Note  Patient: Marc [name]  Procedure(s) Performed: XI ROBOTIC ASSISTED LAPAROSCOPIC RADICAL NEPHRECTOMY (Left )     Patient location during evaluation: PACU Anesthesia Type: General Level of consciousness: awake and alert Pain management: pain level controlled Vital Signs Assessment: post-procedure vital signs reviewed and stable Respiratory status: spontaneous breathing, nonlabored ventilation, respiratory function stable and patient connected to nasal cannula oxygen Cardiovascular status: blood pressure returned to baseline and stable Postop Assessment: no apparent nausea or vomiting Anesthetic complications: no   No complications documented.  Last Vitals:  Vitals:   01/05/20 1145 01/05/20 1200  BP: (!) 163/97 (!) 160/97  Pulse: 82 79  Resp: 12 10  Temp:    SpO2: 96% 95%    Last Pain:  Vitals:   01/05/20 1200  TempSrc:   PainSc: Asleep                 Icel Castles S

## 2020-01-05 NOTE — H&P (Signed)
Urology Preoperative H&P   Chief Complaint: Left renal mass  History of Present Illness: Marc [name] is a 64 y.o. male with  male who was found to have a solid left renal mass on CT chest in July of 2021 that was later found to be a 10.5 cm solid and enhancing lesion on CT abd/pel from 11/16/19. No thoracic pathology was identified on CT chest. He was also found to have an indeterminate right renal lesion on his most recent CT.   -Last serum creatinine- 0.71 (10/01/19)  -Long time smoker  -No prior abdominal surgeries  -Noted to have microscopic hematuria on recent UAs  -Denies flank pain  -Possible episode of gross hematuria. He states that his urine has been "dark" tinged urine for the past several weeks.  -No personal/family history of GU malignancies  -Works as a Dance movement psychotherapist    Past Medical History:  Diagnosis Date  . Hyperlipidemia     Past Surgical History:  Procedure Laterality Date  . right shoulder rotator cuff repair Right     Allergies: No Known Allergies  History reviewed. No pertinent family history.  Social History:  reports that he quit smoking about 4 years ago. He has never used smokeless tobacco. He reports current alcohol use. He reports that he does not use drugs.  ROS: A complete review of systems was performed.  All systems are negative except for pertinent findings as noted.  Physical Exam:  Vital signs in last 24 hours: Temp:  [98.5 F (36.9 C)] 98.5 F (36.9 C) (10/05 0620) Pulse Rate:  [72] 72 (10/05 0620) Resp:  [18] 18 (10/05 0620) BP: (132)/(84) 132/84 (10/05 0620) SpO2:  [93 %] 93 % (10/05 0620) Weight:  [101.2 kg] 101.2 kg (10/05 0541) Constitutional:  Alert and oriented, No acute distress Cardiovascular: Regular rate and rhythm, No JVD Respiratory: Normal respiratory effort, Lungs clear bilaterally GI: Abdomen is soft, nontender, nondistended, no abdominal masses GU: No CVA tenderness Lymphatic: No lymphadenopathy Neurologic:  Grossly intact, no focal deficits Psychiatric: Normal mood and affect  Laboratory Data:  No results for input(s): WBC, HGB, HCT, PLT in the last 72 hours.  Recent Labs    01/05/20 0550  NA 138  K 3.7  CL 104  GLUCOSE 106*  BUN 12  CALCIUM 9.2  CREATININE 0.84     Results for orders placed or performed during the hospital encounter of 01/05/20 (from the past 24 hour(s))  ABO/Rh     Status: None   Collection Time: 01/05/20  5:50 AM  Result Value Ref Range   ABO/RH(D)      A POS Performed at [facility], [place] [address] [place], [place] [address]   Comprehensive metabolic panel     Status: Abnormal   Collection Time: 01/05/20  5:50 AM  Result Value Ref Range   Sodium 138 135 - 145 mmol/L   Potassium 3.7 3.5 - 5.1 mmol/L   Chloride 104 98 - 111 mmol/L   CO2 24 22 - 32 mmol/L   Glucose, Bld 106 (H) 70 - 99 mg/dL   BUN 12 8 - 23 mg/dL   Creatinine, Ser 0.84 0.61 - 1.24 mg/dL   Calcium 9.2 8.9 - 10.3 mg/dL   Total Protein 7.6 6.5 - 8.1 g/dL   Albumin 4.2 3.5 - 5.0 g/dL   AST 22 15 - 41 U/L   ALT 30 0 - 44 U/L   Alkaline Phosphatase 103 38 - 126 U/L   Total Bilirubin 0.9  0.3 - 1.2 mg/dL   GFR calc non Af Amer >60 >60 mL/min   GFR calc Af Amer >60 >60 mL/min   Anion gap 10 5 - 15   Recent Results (from the past 240 hour(s))  SARS CORONAVIRUS 2 (TAT 6-24 HRS) Nasopharyngeal Nasopharyngeal Swab     Status: None   Collection Time: 01/01/20 11:51 AM   Specimen: Nasopharyngeal Swab  Result Value Ref Range Status   SARS Coronavirus 2 NEGATIVE NEGATIVE Final    Comment: (NOTE) SARS-CoV-2 target nucleic acids are NOT DETECTED.  The SARS-CoV-2 RNA is generally detectable in upper and lower respiratory specimens during the acute phase of infection. Negative results do not preclude SARS-CoV-2 infection, do not rule out co-infections with other pathogens, and should not be used as the sole basis for treatment or other patient management decisions. Negative  results must be combined with clinical observations, patient history, and epidemiological information. The expected result is Negative.  Fact Sheet for Patients: SugarRoll.be  Fact Sheet for Healthcare Providers: https://www.woods-mathews.com/  This test is not yet approved or cleared by the [place] FDA and  has been authorized for detection and/or diagnosis of SARS-CoV-2 by FDA under an Emergency Use Authorization (EUA). This EUA will remain  in effect (meaning this test can be used) for the duration of the COVID-19 declaration under Se ction 564(b)(1) of the Act, 21 U.S.C. section 360bbb-3(b)(1), unless the authorization is terminated or revoked sooner.  Performed at [facility], [place] [address], [place], [place] [address]     Renal Function: Recent Labs    01/05/20 0550  CREATININE 0.84   Estimated Creatinine Clearance: 108.7 mL/min (by C-G formula based on SCr of 0.84 mg/dL).  Radiologic Imaging: CLINICAL DATA:  Indeterminate right renal lesion on recent CT. Left renal mass.  EXAM: MRI ABDOMEN WITHOUT AND WITH CONTRAST  TECHNIQUE: Multiplanar multisequence MR imaging of the abdomen was performed both before and after the administration of intravenous contrast.  CONTRAST:  70mL MULTIHANCE GADOBENATE DIMEGLUMINE 529 MG/ML IV SOLN  COMPARISON:  CT on 11/16/2019  FINDINGS: Lower chest: No acute findings.  Hepatobiliary: No hepatic masses identified. Gallbladder is unremarkable. No evidence of biliary ductal dilatation.  Pancreas:  No mass or inflammatory changes.  Spleen:  Within normal limits in size and appearance.  Adrenals/Urinary Tract: Normal adrenal glands. A large heterogeneously enhancing mass is seen involving the lateral mid and lower poles of the left kidney which measures 10.5 x 9.4 cm, which extends laterally to abut Gerota fascia. There is no definite evidence of invasion through  Gerota fascia. No other left renal masses are identified.  A 1.3 cm benign Bosniak category 2 hemorrhagic cyst is seen in the medial interpolar region of the right kidney on image 32/5. In addition, there is a 9 mm lesion in the medial upper pole of the right kidney which shows T1 and T2 hypointensity and possible mild contrast enhancement on subtraction imaging (images 55/18 and 29/5). This is difficult to characterize due to its small size, however a small papillary renal cell carcinoma cannot be excluded.  Stomach/Bowel: Left colonic diverticulosis is noted, with no evidence of diverticulitis in this region.  Vascular/Lymphatic: No pathologically enlarged lymph nodes identified. No evidence of renal vein or IVC thrombus. No abdominal aortic aneurysm.  Other:  None.  Musculoskeletal:  No suspicious bone lesions identified.  IMPRESSION: 10.5 cm left renal mass which abuts Gerota's fascia, consistent with renal cell carcinoma. No other suspicious left renal masses identified.  9 mm indeterminate lesion in the medial upper pole of the right kidney. A small papillary renal cell carcinoma cannot be excluded. Recommend continued follow-up by MRI in 1 year.  No evidence of abdominal lymphadenopathy or other metastatic disease.   Electronically Signed   By: Marlaine Hind M.D.   On: 12/27/2019 10:48 I independently reviewed the above imaging studies.  Assessment and Plan Donovyn Guidice is a 64 y.o. male with a solid and enhancing left renal mass with features concerning for renal cell carcinoma  -I reviewed imaging results and films with the patient personally. We discussed that the mass in question has features concerning for malignancy. I explained the natural history of presumed renal cell carcinoma. I reviewed the AUA guidelines for evaluation and treatment of renal masses. The options of active surveillance, in situ tumor ablation, partial and radical nephrectomy was  discussed. The risks of laparoscopic LEFT radical nephrectomy were discussed in detail including but not limited to: negative pathology, open conversion, infection of the skin/abdominal cavity, VTE, MI/CVA, lymphatic leak, injury to adjacent solid/hollow viscus organs, bleeding requiring a blood transfusion, catastrophic bleeding, hernia formation and other imponderables. The patient voices understanding and wishes to proceed.    Ellison Hughs, MD 01/05/2020, 7:32 AM  [facility] Pager: [phone]

## 2020-01-05 NOTE — Anesthesia Procedure Notes (Signed)
Procedure Name: Intubation Date/Time: 01/05/2020 7:44 AM Performed by: Lollie Sails, CRNA Pre-anesthesia Checklist: Patient identified, Emergency Drugs available, Suction available, Patient being monitored and Timeout performed Patient Re-evaluated:Patient Re-evaluated prior to induction Oxygen Delivery Method: Circle system utilized Preoxygenation: Pre-oxygenation with 100% oxygen Induction Type: IV induction Ventilation: Mask ventilation without difficulty and Oral airway inserted - appropriate to patient size Laryngoscope Size: Miller and 3 Grade View: Grade III Tube type: Oral Tube size: 7.5 mm Number of attempts: 1 Airway Equipment and Method: Stylet Placement Confirmation: ETT inserted through vocal cords under direct vision,  positive ETCO2 and breath sounds checked- equal and bilateral Secured at: 23 cm Tube secured with: Tape Dental Injury: Teeth and Oropharynx as per pre-operative assessment  Comments: Able to visualize base of arytenoids only.

## 2020-01-05 NOTE — Op Note (Signed)
Operative Note  Preoperative diagnosis:  1.  10.5 cm left renal mass  Postoperative diagnosis: 1.  10.5 cm left renal mass  Procedure(s): 1.  Robot-assisted laparoscopic left radical nephrectomy  Surgeon: Ellison Hughs, MD  Assistants:  None  Anesthesia:  General  Complications:  None  EBL:  100 mL  Specimens: 1.  Left kidney 2.  Para-aortic lymph nodes   Drains/Catheters: 1.  Foley catheter  Intraoperative findings:   1. Prominent left para-aortic/peri-hilar lymph nodes 2. The left renal hilum was hemostatic following ligation  Indication:  Marc [name] is a 64 y.o. male with a solid and enhancing left renal mass seen on CT and MRI with features concerning for renal cell carcinoma.  He has been consented for the above procedures, voices understanding and wishes to proceed.   Description of procedure:  After informed consent was obtained, the patient was brought to the operating room and general endotracheal anesthesia was administered.  The patient was then placed in the right lateral decubitus position and prepped and draped in usual sterile fashion.  A timeout was performed.  An 8 mm incision was then made lateral to the left rectus muscle at the level of the left 12th rib.  Abdominal access was obtained via a Veress needle.  The abdominal cavity was then insufflated up to 15 mmHg.  An 8 mm port was then introduced into the abdominal cavity.  Inspection of the port entry site by the robotic camera revealed no adjacent organ injury.  We then placed 3 additional 8 mm robotic ports to triangulate the left renal hilum.  A 12 mm assistant port was then placed between the carmera port and 3rd robotic arm.  The white line of Toldt along the descending colon was incised sharply and the colon, along with its mesocolonic fat, was reflected medially until the aorta was identified.  We then made a small window adjacent to the lower pole of the left kidney, identifying the left psoas  muscle, left ureter and left gonadal vein.  The left ureter and gonadal vein were then reflected anteriorly allowing [place] to then incised the perihilar attachments using electrocautery.  We encountered a small lumbar vein adjacent to the insertion of the left gonadal vein into the left renal vein.  This lumbar vein was ligated with hemo-lock clips in 2 places and incised sharply.  This provided [place] excellent exposure to the left renal hilum.    Hemolock clips were used to ligate the left renal artery, which was then sharply incised.  A 45 mm endovascular stapler was then used to ligate left renal vein, achieving excellent hemostasis.  The remaining peri-renal attachments were then excised using a combination of blunt dissection and electrocautery.  The left adrenal gland was spared.  The endovascular stapler was then used to ligate the left gonadal vein and left ureter.  Once the kidney was freely mobile, it was placed in Endo Catch bag to be be retrieved at the conclusion of the case.  There were several prominent lymph nodes adjacent to there left renal hilum and along the para-aortic gutter that were removed using the split and roll technique.  The excised lymph nodes were placed in a separate endocatch bag.    The robot was then de-docked.  A left lower quadrant Gibson incision was then made and lymph nodes along with the left kidney were removed within their respective Endo Catch bags.  The fascia within the midline assistant port was then closed using an interrupted  0 Vicryl suture.  The fascia of the internal and external oblique was then closed using a 0 PDS suture in a running fashion.  The subcutaneous tissue within the [employer] incision was then closed using a running 0 Vicryl suture.  All skin incisions were then closed using 4-0 Monocryl and then dressed with Dermabond.  The patient tolerated the procedure well and was transferred to the postanesthesia in stable condition.    Plan:  Monitor on the  floor overnight.

## 2020-01-05 NOTE — Progress Notes (Signed)
Patient arrived to unit alert, oriented x4. Incisions clean dry intact x5.

## 2020-01-05 NOTE — Plan of Care (Signed)
Patient handbook provided. Bed alarm set. All belongings have been placed within reach. Patient will call for assistance when needing yo get out of the bed.

## 2020-01-05 NOTE — Discharge Instructions (Signed)

## 2020-01-06 ENCOUNTER — Encounter (HOSPITAL_COMMUNITY): Payer: Self-pay | Admitting: Urology

## 2020-01-06 LAB — HEMOGLOBIN AND HEMATOCRIT, BLOOD
HCT: 39.5 % (ref 39.0–52.0)
Hemoglobin: 12.8 g/dL — ABNORMAL LOW (ref 13.0–17.0)

## 2020-01-06 LAB — BASIC METABOLIC PANEL
Anion gap: 10 (ref 5–15)
BUN: 14 mg/dL (ref 8–23)
CO2: 27 mmol/L (ref 22–32)
Calcium: 9.2 mg/dL (ref 8.9–10.3)
Chloride: 101 mmol/L (ref 98–111)
Creatinine, Ser: 1.09 mg/dL (ref 0.61–1.24)
GFR calc non Af Amer: >60 mL/min (ref >60)
Glucose, Bld: 133 mg/dL — ABNORMAL HIGH (ref 70–99)
Potassium: 4.9 mmol/L (ref 3.5–5.1)
Sodium: 138 mmol/L (ref 135–145)

## 2020-01-06 MED ORDER — CEFAZOLIN SODIUM-DEXTROSE 1-4 GM/50ML-% IV SOLN: 1.0000 g | Freq: Once | INTRAVENOUS | Status: DC

## 2020-01-06 MED ORDER — BISACODYL 10 MG RE SUPP
10.0000 mg | Freq: Once | RECTAL | Status: AC
  Administered 2020-01-06: 10 mg via RECTAL
  Filled 2020-01-06: qty 1

## 2020-01-06 NOTE — Progress Notes (Signed)
Patient ID: Marc [name], male   DOB: [birth year], 64 y.o.   MRN: [id number] 1 Day Post-Op Subjective: The patient is doing well.  Mild nausea last night but no nausea or vomiting this morning. Pain is adequately controlled. Has not ambulated.  Using IS. No flatus.  Objective: Vital signs in last 24 hours: Temp:  [97.7 F (36.5 C)-99.1 F (37.3 C)] 97.7 F (36.5 C) (10/06 0507) Pulse Rate:  [71-92] 74 (10/06 0507) Resp:  [10-21] 18 (10/06 0002) BP: (121-172)/(71-105) 121/71 (10/06 0507) SpO2:  [95 %-100 %] 97 % (10/06 0507)  Intake/Output from previous day: 10/05 0701 - 10/06 0700 In: 2541.7 [P.O.:120; I.V.:2271.7; IV Piggyback:150] Out: 4460 [Urine:4260; Blood:200] Intake/Output this shift: Total I/O In: -  Out: 400 [Urine:400]  Physical Exam:  General: Alert and oriented. CV: RRR Lungs: Clear bilaterally. GI: Soft, Nondistended. Incisions: Clean and dry. Urine: Clear Extremities: Nontender, no erythema, no edema.  Lab Results: Recent Labs    01/05/20 1141 01/06/20 0414  HGB 12.3* 12.8*  HCT 37.8* 39.5          Recent Labs    01/05/20 0550 01/06/20 0414  CREATININE 0.84 1.09           Results for orders placed or performed during the hospital encounter of 01/05/20 (from the past 24 hour(s))  Hemoglobin and hematocrit, blood     Status: Abnormal   Collection Time: 01/05/20 11:41 AM  Result Value Ref Range   Hemoglobin 12.3 (L) 13.0 - 17.0 g/dL   HCT 37.8 (L) 39 - 52 %  Basic metabolic panel     Status: Abnormal   Collection Time: 01/06/20  4:14 AM  Result Value Ref Range   Sodium 138 135 - 145 mmol/L   Potassium 4.9 3.5 - 5.1 mmol/L   Chloride 101 98 - 111 mmol/L   CO2 27 22 - 32 mmol/L   Glucose, Bld 133 (H) 70 - 99 mg/dL   BUN 14 8 - 23 mg/dL   Creatinine, Ser 1.09 0.61 - 1.24 mg/dL   Calcium 9.2 8.9 - 10.3 mg/dL   GFR calc non Af Amer >60 >60 mL/min   Anion gap 10 5 - 15  Hemoglobin and hematocrit, blood     Status: Abnormal   Collection Time:  01/06/20  4:14 AM  Result Value Ref Range   Hemoglobin 12.8 (L) 13.0 - 17.0 g/dL   HCT 39.5 39 - 52 %    Assessment/Plan: POD# 1 s/p RA laparoscopic nephrectomy.  1) Ambulate, Incentive spirometry 2) Advance diet as tolerated 3) Transition to oral pain medication 4) Dulcolax suppository 5) D/C urethral catheter    LOS: 1 day   Debbrah Alar 01/06/2020, 8:32 AM

## 2020-01-06 NOTE — Discharge Summary (Signed)
  Date of admission: 01/05/2020  Date of discharge: 01/06/2020  Admission diagnosis:Left renal mass  Discharge diagnosis: same  Secondary diagnoses: smoker, hyperlipidemia  History and Physical: For full details, please see admission history and physical. Briefly, Marc [name] is a 64 y.o. year old patient with a solid left renal mass on CT chest in July of 2021 that was later found to be a 10.5 cm solid and enhancing lesion on CT abd/pel from 11/16/19. No thoracic pathology was identified on CT chest. He was also found to have an indeterminate right renal lesion on his most recent CT.   Hospital Course: Pt was admitted and taken to the OR on 01/05/20 for a left robotic assisted lap radical nephrectomy.  Pt tolerated procedure well and remained hemodynamically stable.  He was extubated without complication and woke up from anesthesia neurologically intact. He was transferred from the OR to PACU and then to the floor without issue. Pt's post op course progressed as expected.  His foley was removed and he was able to void on POD 1.  He passed flatus and had a small BM on POD 1 as well.  His diet was advanced and he was tolerating a regular diet at time of d/c.  He was ambulating and his pain was well controlled with PO medications.  He was felt stable for d/c home on the afternoon of POD 1.   Laboratory values: Recent Labs    01/05/20 1141 01/06/20 0414  HGB 12.3* 12.8*  HCT 37.8* 39.5   Recent Labs    01/05/20 0550 01/06/20 0414  CREATININE 0.84 1.09    Disposition: Home  Discharge instruction: The patient was instructed to be ambulatory but told to refrain from heavy lifting, strenuous activity, or driving.   Discharge medications:  Allergies as of 01/06/2020   No Known Allergies     Medication List    STOP taking these medications   aspirin EC 81 MG tablet   diclofenac 75 MG EC tablet Commonly known as: VOLTAREN   FISH OIL PO   IRON SLOW RELEASE PO   multivitamin with  minerals Tabs tablet   VITAMIN C PO   VITAMIN D3 PO   VITAMIN E PO     TAKE these medications   atorvastatin 10 MG tablet Commonly known as: LIPITOR Take 10 mg by mouth at bedtime.   docusate sodium 100 MG capsule Commonly known as: COLACE Take 1 capsule (100 mg total) by mouth 2 (two) times daily.   HYDROcodone-acetaminophen 5-325 MG tablet Commonly known as: Norco Take 1-2 tablets by mouth every 6 (six) hours as needed for moderate pain or severe pain.   promethazine 12.5 MG tablet Commonly known as: PHENERGAN Take 1 tablet (12.5 mg total) by mouth every 4 (four) hours as needed for nausea or vomiting.       Followup:   Follow-up Information    Ceasar Mons, MD On 01/21/2020.   Specialty: Urology Why: at 10:00 Contact information: [address] [address] [phone]

## 2020-01-07 LAB — SURGICAL PATHOLOGY

## 2020-01-28 ENCOUNTER — Ambulatory Visit: Payer: Self-pay

## 2020-01-28 ENCOUNTER — Ambulatory Visit (INDEPENDENT_AMBULATORY_CARE_PROVIDER_SITE_OTHER): Payer: 59 | Admitting: Orthopaedic Surgery

## 2020-01-28 ENCOUNTER — Encounter: Payer: Self-pay | Admitting: Orthopaedic Surgery

## 2020-01-28 VITALS — Ht 69.0 in | Wt 219.6 lb

## 2020-01-28 DIAGNOSIS — M25552 Pain in left hip: Secondary | ICD-10-CM

## 2020-01-28 DIAGNOSIS — M1612 Unilateral primary osteoarthritis, left hip: Secondary | ICD-10-CM | POA: Diagnosis not present

## 2020-01-28 NOTE — Progress Notes (Signed)
Office Visit Note   Patient: Marc [name]           Date of Birth: Jan 11, 1956           MRN: [id number] Visit Date: 01/28/2020              Requested by: [name], PA [address] [place],  [place] [address] PCP: [name], PA   Assessment & Plan: Visit Diagnoses:  1. Pain in left hip   2. Unilateral primary osteoarthritis, left hip     Plan: The patient does have profound end-stage arthritis of the left hip that is significantly worsened over almost 2 years now.  At this point I agree with the need for hip replacement surgery.  I talked about the risk and benefits of the surgery.  I described interoperative and postoperative course and what to expect with this type of surgery.  All questions and concerns were answered and addressed.  I showed him a hip model went over his x-rays in detail.  We will work on getting him scheduled for a left total hip arthroplasty hopefully in the near future.  Follow-Up Instructions: Return for 2 weeks post-op.   Orders:  Orders Placed This Encounter  Procedures  . XR HIP UNILAT W OR W/O PELVIS 2-3 VIEWS LEFT   No orders of the defined types were placed in this encounter.     Procedures: No procedures performed   Clinical Data: No additional findings.   Subjective: Chief Complaint  Patient presents with  . Left Hip - Pain  The patient comes in today to discuss hip replacement surgery.  We first saw him for his hip in January 2020.  It is been getting worse in terms of pain for several years at that point.  He recently underwent a nephrectomy due to her renal based cancer.  He said that was the definitive treatment for this.  He cannot take anti-inflammatories now due to just having one kidney.  At this point his left hip is incredibly stiff and painful.  He has a leg length discrepancy on that side as well.  At this point his left hip pain is 10 out of 10 and is detriment affecting his mobility, his quality of life,  and his actives daily living.  He has tried and failed all forms of conservative treatment.  HPI  Review of Systems There is currently listed no headache, chest pain, shortness of breath, fever, chills, nausea, vomiting  Objective: Vital Signs: Ht 5\' 9"  (1.753 m)   Wt 219 lb 9.6 oz (99.6 kg)   BMI 32.43 kg/m   Physical Exam He is alert and orient x3 and in no acute distress Ortho Exam Examination of his left hip shows profound stiffness with any attempts of internal or external rotation.  There is significant pain in the groin with rotation.  He cannot put his shoes or socks on well on that side or cross his leg.  There is a ligament discrepancy as well the left side shorter than the right. Specialty Comments:  No specialty comments available.  Imaging: XR HIP UNILAT W OR W/O PELVIS 2-3 VIEWS LEFT  Result Date: 01/28/2020 An AP pelvis and lateral left hip show severe end-stage arthritis left hip.  This is worsened significantly when compared to films from January 2020.  There is complete loss of the joint space.  There are periarticular osteophytes noted.  There are sclerotic changes in the femoral head and acetabulum as  well as cystic changes on both sides of the joint.    PMFS History: Patient Active Problem List   Diagnosis Date Noted  . Unilateral primary osteoarthritis, left hip 01/28/2020  . Renal mass 01/05/2020   Past Medical History:  Diagnosis Date  . Hyperlipidemia     History reviewed. No pertinent family history.  Past Surgical History:  Procedure Laterality Date  . right shoulder rotator cuff repair Right   . ROBOT ASSISTED LAPAROSCOPIC NEPHRECTOMY Left 01/05/2020   Procedure: XI ROBOTIC ASSISTED LAPAROSCOPIC RADICAL NEPHRECTOMY;  Surgeon: Ceasar Mons, MD;  Location: [facility];  Service: Urology;  Laterality: Left;   Social History   Occupational History  . Not on file  Tobacco Use  . Smoking status: Former Smoker    Quit date: 04/03/2015     Years since quitting: 4.8  . Smokeless tobacco: Never Used  Vaping Use  . Vaping Use: Some days  . Substances: Nicotine, Flavoring  Substance and Sexual Activity  . Alcohol use: Yes    Comment: occas   . Drug use: No  . Sexual activity: Not on file

## 2020-02-19 ENCOUNTER — Other Ambulatory Visit: Payer: Self-pay | Admitting: Orthopaedic Surgery

## 2020-02-19 NOTE — Telephone Encounter (Signed)
Please advise 

## 2020-03-07 ENCOUNTER — Other Ambulatory Visit: Payer: Self-pay

## 2020-03-15 ENCOUNTER — Other Ambulatory Visit: Payer: Self-pay | Admitting: Physician Assistant

## 2020-03-28 NOTE — Progress Notes (Signed)
Your procedure is scheduled on Thursday, March 31, 2020.  Report to [facility] Main Entrance "A" at 10:00 A.M., and check in at the Admitting office.  Call this number if you have problems the morning of surgery:  [phone]  Call [phone] if you have any questions prior to your surgery date Monday-Friday 8am-4pm    Remember:  Do not eat after midnight the night before your surgery  You may drink clear liquids until 9:00 AM the morning of your surgery.   Clear liquids allowed are: Water, Non-Citrus Juices (without pulp), Carbonated Beverages, Clear Tea, Black Coffee Only, and Gatorade    Enhanced Recovery after Surgery for Orthopedics Enhanced Recovery after Surgery is a protocol used to improve the stress on your body and your recovery after surgery.  Patient Instructions  . The night before surgery:  o No food after midnight. ONLY clear liquids after midnight  .  Marland Kitchen The day of surgery (if you do NOT have diabetes):  o Drink ONE (1) Pre-Surgery Clear Ensure by 9:00 AM the morning of surgery   o This drink was given to you during your hospital  pre-op appointment visit. o Nothing else to drink after completing the  Pre-Surgery Clear Ensure.         If you have questions, please contact your surgeon's office.     Take these medicines the morning of surgery with A SIP OF WATER:  NONE  Follow your surgeon's instructions on when to stop Aspirin.  If no instructions were given by your surgeon then you will need to call the office to get those instructions.    As of today, STOP taking any Aleve, Naproxen, Ibuprofen, diclofenac (VOLTAREN), Motrin, Advil, Goody's, BC's, all herbal medications, fish oil, and all vitamins.                      Do not wear jewelry.            Do not wear lotions, powders, colognes, or deodorant.            Do not shave 48 hours prior to surgery.  Men may shave face and neck.            Do not bring valuables to the hospital.             [facility] is not responsible for any belongings or valuables.  Do NOT Smoke (Tobacco/Vaping) or drink Alcohol 24 hours prior to your procedure If you use a CPAP at night, you may bring all equipment for your overnight stay.   Contacts, glasses, dentures or bridgework may not be worn into surgery.      For patients admitted to the hospital, discharge time will be determined by your treatment team.   Patients discharged the day of surgery will not be allowed to drive home, and someone needs to stay with them for 24 hours.    Special instructions:   - Preparing For Surgery  Before surgery, you can play an important role. Because skin is not sterile, your skin needs to be as free of germs as possible. You can reduce the number of germs on your skin by washing with CHG (chlorahexidine gluconate) Soap before surgery.  CHG is an antiseptic cleaner which kills germs and bonds with the skin to continue killing germs even after washing.    Oral Hygiene is also important to reduce your risk of infection.  Remember - BRUSH YOUR TEETH THE MORNING  OF SURGERY WITH YOUR REGULAR TOOTHPASTE  Please do not use if you have an allergy to CHG or antibacterial soaps. If your skin becomes reddened/irritated stop using the CHG.  Do not shave (including legs and underarms) for at least 48 hours prior to first CHG shower. It is OK to shave your face.  Please follow these instructions carefully.   1. Shower the NIGHT BEFORE SURGERY and the MORNING OF SURGERY with CHG Soap.   2. If you chose to wash your hair, wash your hair first as usual with your normal shampoo.  3. After you shampoo, rinse your hair and body thoroughly to remove the shampoo.  4. Use CHG as you would any other liquid soap. You can apply CHG directly to the skin and wash gently with a scrungie or a clean washcloth.   5. Apply the CHG Soap to your body ONLY FROM THE NECK DOWN.  Do not use on open wounds or open sores. Avoid contact  with your eyes, ears, mouth and genitals (private parts). Wash Face and genitals (private parts)  with your normal soap.   6. Wash thoroughly, paying special attention to the area where your surgery will be performed.  7. Thoroughly rinse your body with warm water from the neck down.  8. DO NOT shower/wash with your normal soap after using and rinsing off the CHG Soap.  9. Pat yourself dry with a CLEAN TOWEL.  10. Wear CLEAN PAJAMAS to bed the night before surgery  11. Place CLEAN SHEETS on your bed the night of your first shower and DO NOT SLEEP WITH PETS.   Day of Surgery: Wear Clean/Comfortable clothing the morning of surgery Do not apply any deodorants/lotions.   Remember to brush your teeth WITH YOUR REGULAR TOOTHPASTE.   Please read over the following fact sheets that you were given.

## 2020-03-29 ENCOUNTER — Other Ambulatory Visit: Payer: Self-pay

## 2020-03-29 ENCOUNTER — Encounter (HOSPITAL_COMMUNITY): Payer: Self-pay

## 2020-03-29 ENCOUNTER — Encounter (HOSPITAL_COMMUNITY)
Admission: RE | Admit: 2020-03-29 | Discharge: 2020-03-29 | Disposition: A | Discharge status: Home / Self Care | Payer: 59 | Source: Ambulatory Visit | Attending: Orthopaedic Surgery | Admitting: Orthopaedic Surgery

## 2020-03-29 ENCOUNTER — Other Ambulatory Visit (HOSPITAL_COMMUNITY)
Admission: RE | Admit: 2020-03-29 | Discharge: 2020-03-29 | Disposition: A | Discharge status: Home / Self Care | Payer: 59 | Source: Ambulatory Visit | Attending: Orthopaedic Surgery | Admitting: Orthopaedic Surgery

## 2020-03-29 DIAGNOSIS — Z01812 Encounter for preprocedural laboratory examination: Secondary | ICD-10-CM | POA: Insufficient documentation

## 2020-03-29 DIAGNOSIS — Z20822 Contact with and (suspected) exposure to covid-19: Secondary | ICD-10-CM | POA: Insufficient documentation

## 2020-03-29 HISTORY — DX: Malignant (primary) neoplasm, unspecified: C80.1

## 2020-03-29 LAB — TYPE AND SCREEN
ABO/RH(D): A POS
Antibody Screen: NEGATIVE

## 2020-03-29 LAB — CBC
HCT: 41.2 % (ref 39.0–52.0)
Hemoglobin: 13.3 g/dL (ref 13.0–17.0)
MCH: 28.3 pg (ref 26.0–34.0)
MCHC: 32.3 g/dL (ref 30.0–36.0)
MCV: 87.7 fL (ref 80.0–100.0)
Platelets: 409 10*3/uL — ABNORMAL HIGH (ref 150–400)
RBC: 4.7 MIL/uL (ref 4.22–5.81)
RDW: 14.7 % (ref 11.5–15.5)
WBC: 10.3 10*3/uL (ref 4.0–10.5)
nRBC: 0 % (ref 0.0–0.2)

## 2020-03-29 LAB — SURGICAL PCR SCREEN
MRSA, PCR: NEGATIVE
Staphylococcus aureus: NEGATIVE

## 2020-03-29 NOTE — Progress Notes (Signed)
PCP - Altamease Oiler [name] PA Cardiologist - N/A  PPM/ICD - Denies  Chest x-ray - N/A EKG - N/A Stress Test - Denies ECHO - Denies Cardiac Cath - Denies  Sleep Study - Denies  Patient denies having diabetes.  Blood Thinner Instructions: N/A Aspirin Instructions: LD: 03/28/20  ERAS Protcol - Yes PRE-SURGERY Ensure - Yes  COVID TEST- 03/29/20   Anesthesia review: No  Patient denies shortness of breath, fever, cough and chest pain at PAT appointment   All instructions explained to the patient, with a verbal understanding of the material. Patient agrees to go over the instructions while at home for a better understanding. Patient also instructed to self quarantine after being tested for COVID-19. The opportunity to ask questions was provided.

## 2020-03-30 LAB — SARS CORONAVIRUS 2 (TAT 6-24 HRS): SARS Coronavirus 2: NEGATIVE

## 2020-03-31 ENCOUNTER — Ambulatory Visit (HOSPITAL_COMMUNITY): Payer: 59 | Admitting: Anesthesiology

## 2020-03-31 ENCOUNTER — Encounter (HOSPITAL_COMMUNITY): Payer: Self-pay | Admitting: Orthopaedic Surgery

## 2020-03-31 ENCOUNTER — Ambulatory Visit (HOSPITAL_COMMUNITY): Payer: 59

## 2020-03-31 ENCOUNTER — Observation Stay (HOSPITAL_COMMUNITY)
Admission: RE | Admit: 2020-03-31 | Discharge: 2020-04-01 | Disposition: A | Discharge status: Home / Self Care | Payer: 59 | Attending: Orthopaedic Surgery | Admitting: Orthopaedic Surgery

## 2020-03-31 ENCOUNTER — Encounter (HOSPITAL_COMMUNITY)
Admission: RE | Disposition: A | Discharge status: Home / Self Care | Payer: Self-pay | Source: Home / Self Care | Attending: Orthopaedic Surgery

## 2020-03-31 ENCOUNTER — Other Ambulatory Visit: Payer: Self-pay

## 2020-03-31 ENCOUNTER — Observation Stay (HOSPITAL_COMMUNITY): Payer: 59

## 2020-03-31 DIAGNOSIS — Z87891 Personal history of nicotine dependence: Secondary | ICD-10-CM | POA: Insufficient documentation

## 2020-03-31 DIAGNOSIS — M25552 Pain in left hip: Secondary | ICD-10-CM | POA: Diagnosis present

## 2020-03-31 DIAGNOSIS — Z419 Encounter for procedure for purposes other than remedying health state, unspecified: Secondary | ICD-10-CM

## 2020-03-31 DIAGNOSIS — M1612 Unilateral primary osteoarthritis, left hip: Principal | ICD-10-CM | POA: Insufficient documentation

## 2020-03-31 DIAGNOSIS — Z96642 Presence of left artificial hip joint: Secondary | ICD-10-CM

## 2020-03-31 DIAGNOSIS — Z85528 Personal history of other malignant neoplasm of kidney: Secondary | ICD-10-CM | POA: Insufficient documentation

## 2020-03-31 HISTORY — PX: TOTAL HIP ARTHROPLASTY: SHX124

## 2020-03-31 LAB — CREATININE, SERUM
Creatinine, Ser: 1.05 mg/dL (ref 0.61–1.24)
GFR, Estimated: >60 mL/min (ref >60)

## 2020-03-31 SURGERY — ARTHROPLASTY, HIP, TOTAL, ANTERIOR APPROACH
Anesthesia: Spinal | Site: Hip | Laterality: Left

## 2020-03-31 MED ORDER — PROPOFOL 500 MG/50ML IV EMUL
INTRAVENOUS | Status: DC | PRN
  Administered 2020-03-31: 75 ug/kg/min via INTRAVENOUS

## 2020-03-31 MED ORDER — BUPIVACAINE IN DEXTROSE 0.75-8.25 % IT SOLN
INTRATHECAL | Status: DC | PRN
  Administered 2020-03-31: 2 mL via INTRATHECAL

## 2020-03-31 MED ORDER — DEXAMETHASONE SODIUM PHOSPHATE 10 MG/ML IJ SOLN
INTRAMUSCULAR | Status: AC
  Filled 2020-03-31: qty 1

## 2020-03-31 MED ORDER — METHOCARBAMOL 500 MG PO TABS
500.0000 mg | ORAL_TABLET | Freq: Four times a day (QID) | ORAL | Status: DC | PRN
  Administered 2020-04-01: 500 mg via ORAL
  Filled 2020-03-31: qty 1

## 2020-03-31 MED ORDER — ADULT MULTIVITAMIN W/MINERALS CH: 1.0000 | ORAL_TABLET | Freq: Every day | ORAL | Status: DC

## 2020-03-31 MED ORDER — ONDANSETRON HCL 4 MG/2ML IJ SOLN
INTRAMUSCULAR | Status: DC | PRN
  Administered 2020-03-31: 4 mg via INTRAVENOUS

## 2020-03-31 MED ORDER — PROPOFOL 10 MG/ML IV BOLUS
INTRAVENOUS | Status: DC | PRN
  Administered 2020-03-31: 30 mg via INTRAVENOUS

## 2020-03-31 MED ORDER — ATORVASTATIN CALCIUM 10 MG PO TABS
10.0000 mg | ORAL_TABLET | Freq: Every day | ORAL | Status: DC
  Administered 2020-03-31: 10 mg via ORAL
  Filled 2020-03-31: qty 1

## 2020-03-31 MED ORDER — CEFAZOLIN SODIUM-DEXTROSE 1-4 GM/50ML-% IV SOLN
1.0000 g | Freq: Four times a day (QID) | INTRAVENOUS | Status: AC
  Administered 2020-03-31 – 2020-04-01 (×2): 1 g via INTRAVENOUS
  Filled 2020-03-31 (×2): qty 50

## 2020-03-31 MED ORDER — POVIDONE-IODINE 10 % EX SWAB: 2.0000 "application " | Freq: Once | CUTANEOUS | Status: DC

## 2020-03-31 MED ORDER — DIPHENHYDRAMINE HCL 12.5 MG/5ML PO ELIX
12.5000 mg | ORAL_SOLUTION | ORAL | Status: DC | PRN
Start: 2020-03-31 — End: 2020-04-01
  Filled 2020-03-31: qty 10

## 2020-03-31 MED ORDER — CEFAZOLIN SODIUM-DEXTROSE 2-4 GM/100ML-% IV SOLN
2.0000 g | INTRAVENOUS | Status: AC
  Administered 2020-03-31: 2 g via INTRAVENOUS

## 2020-03-31 MED ORDER — GABAPENTIN 100 MG PO CAPS
100.0000 mg | ORAL_CAPSULE | Freq: Three times a day (TID) | ORAL | Status: DC
  Administered 2020-03-31: 100 mg via ORAL
  Filled 2020-03-31: qty 1

## 2020-03-31 MED ORDER — FENTANYL CITRATE (PF) 250 MCG/5ML IJ SOLN
INTRAMUSCULAR | Status: AC
  Filled 2020-03-31: qty 5

## 2020-03-31 MED ORDER — MIDAZOLAM HCL 5 MG/5ML IJ SOLN
INTRAMUSCULAR | Status: DC | PRN
  Administered 2020-03-31 (×2): 1 mg via INTRAVENOUS

## 2020-03-31 MED ORDER — CHLORHEXIDINE GLUCONATE 0.12 % MT SOLN: 15.0000 mL | Freq: Once | OROMUCOSAL | Status: AC

## 2020-03-31 MED ORDER — OXYCODONE HCL 5 MG/5ML PO SOLN: 5.0000 mg | Freq: Once | ORAL | Status: DC | PRN

## 2020-03-31 MED ORDER — METOCLOPRAMIDE HCL 5 MG PO TABS: 5.0000 mg | ORAL_TABLET | Freq: Three times a day (TID) | ORAL | Status: DC | PRN

## 2020-03-31 MED ORDER — MIDAZOLAM HCL 2 MG/2ML IJ SOLN
INTRAMUSCULAR | Status: AC
  Filled 2020-03-31: qty 2

## 2020-03-31 MED ORDER — OXYCODONE HCL 5 MG PO TABS
10.0000 mg | ORAL_TABLET | ORAL | Status: DC | PRN
  Administered 2020-03-31 – 2020-04-01 (×2): 10 mg via ORAL
  Filled 2020-03-31 (×2): qty 2

## 2020-03-31 MED ORDER — PROMETHAZINE HCL 25 MG/ML IJ SOLN
6.2500 mg | INTRAMUSCULAR | Status: DC | PRN
Start: 2020-03-31 — End: 2020-03-31

## 2020-03-31 MED ORDER — MENTHOL 3 MG MT LOZG: 1.0000 | LOZENGE | OROMUCOSAL | Status: DC | PRN

## 2020-03-31 MED ORDER — LIDOCAINE 2% (20 MG/ML) 5 ML SYRINGE
INTRAMUSCULAR | Status: AC
  Filled 2020-03-31: qty 5

## 2020-03-31 MED ORDER — PANTOPRAZOLE SODIUM 40 MG PO TBEC
40.0000 mg | DELAYED_RELEASE_TABLET | Freq: Every day | ORAL | Status: DC
  Administered 2020-03-31: 40 mg via ORAL
  Filled 2020-03-31: qty 1

## 2020-03-31 MED ORDER — HYDROMORPHONE HCL 1 MG/ML IJ SOLN: 0.5000 mg | INTRAMUSCULAR | Status: DC | PRN

## 2020-03-31 MED ORDER — TRANEXAMIC ACID-NACL 1000-0.7 MG/100ML-% IV SOLN
INTRAVENOUS | Status: AC
  Filled 2020-03-31: qty 100

## 2020-03-31 MED ORDER — DOCUSATE SODIUM 100 MG PO CAPS
100.0000 mg | ORAL_CAPSULE | Freq: Two times a day (BID) | ORAL | Status: DC
  Administered 2020-03-31: 100 mg via ORAL
  Filled 2020-03-31: qty 1

## 2020-03-31 MED ORDER — LACTATED RINGERS IV SOLN: INTRAVENOUS | Status: DC

## 2020-03-31 MED ORDER — FENTANYL CITRATE (PF) 100 MCG/2ML IJ SOLN: 25.0000 ug | INTRAMUSCULAR | Status: DC | PRN

## 2020-03-31 MED ORDER — ONDANSETRON HCL 4 MG/2ML IJ SOLN
INTRAMUSCULAR | Status: AC
  Filled 2020-03-31: qty 2

## 2020-03-31 MED ORDER — ASPIRIN 81 MG PO CHEW
81.0000 mg | CHEWABLE_TABLET | Freq: Two times a day (BID) | ORAL | Status: DC
  Administered 2020-03-31: 81 mg via ORAL
  Filled 2020-03-31: qty 1

## 2020-03-31 MED ORDER — PHENOL 1.4 % MT LIQD: 1.0000 | OROMUCOSAL | Status: DC | PRN

## 2020-03-31 MED ORDER — CHLORHEXIDINE GLUCONATE 0.12 % MT SOLN
OROMUCOSAL | Status: AC
  Administered 2020-03-31: 15 mL via OROMUCOSAL
  Filled 2020-03-31: qty 15

## 2020-03-31 MED ORDER — DEXAMETHASONE SODIUM PHOSPHATE 10 MG/ML IJ SOLN
INTRAMUSCULAR | Status: DC | PRN
  Administered 2020-03-31: 5 mg via INTRAVENOUS

## 2020-03-31 MED ORDER — 0.9 % SODIUM CHLORIDE (POUR BTL) OPTIME
TOPICAL | Status: DC | PRN
  Administered 2020-03-31: 1000 mL

## 2020-03-31 MED ORDER — METOCLOPRAMIDE HCL 5 MG/ML IJ SOLN: 5.0000 mg | Freq: Three times a day (TID) | INTRAMUSCULAR | Status: DC | PRN

## 2020-03-31 MED ORDER — ONDANSETRON HCL 4 MG/2ML IJ SOLN: 4.0000 mg | Freq: Four times a day (QID) | INTRAMUSCULAR | Status: DC | PRN

## 2020-03-31 MED ORDER — VITAMIN D 25 MCG (1000 UNIT) PO TABS
1000.0000 [IU] | ORAL_TABLET | Freq: Every day | ORAL | Status: DC
  Administered 2020-03-31: 1000 [IU] via ORAL
  Filled 2020-03-31: qty 1

## 2020-03-31 MED ORDER — PHENYLEPHRINE HCL-NACL 10-0.9 MG/250ML-% IV SOLN
INTRAVENOUS | Status: DC | PRN
  Administered 2020-03-31: 30 ug/min via INTRAVENOUS

## 2020-03-31 MED ORDER — SODIUM CHLORIDE 0.9 % IR SOLN
Status: DC | PRN
  Administered 2020-03-31: 3000 mL

## 2020-03-31 MED ORDER — TRANEXAMIC ACID-NACL 1000-0.7 MG/100ML-% IV SOLN
1000.0000 mg | INTRAVENOUS | Status: AC
  Administered 2020-03-31: 1000 mg via INTRAVENOUS

## 2020-03-31 MED ORDER — OXYCODONE HCL 5 MG PO TABS: 5.0000 mg | ORAL_TABLET | Freq: Once | ORAL | Status: DC | PRN

## 2020-03-31 MED ORDER — ACETAMINOPHEN 325 MG PO TABS: 325.0000 mg | ORAL_TABLET | Freq: Four times a day (QID) | ORAL | Status: DC | PRN

## 2020-03-31 MED ORDER — LIDOCAINE 2% (20 MG/ML) 5 ML SYRINGE
INTRAMUSCULAR | Status: DC | PRN
  Administered 2020-03-31: 20 mg via INTRAVENOUS

## 2020-03-31 MED ORDER — ASCORBIC ACID 500 MG PO TABS
500.0000 mg | ORAL_TABLET | Freq: Every day | ORAL | Status: DC
  Administered 2020-03-31: 500 mg via ORAL
  Filled 2020-03-31 (×2): qty 1

## 2020-03-31 MED ORDER — ONDANSETRON HCL 4 MG PO TABS: 4.0000 mg | ORAL_TABLET | Freq: Four times a day (QID) | ORAL | Status: DC | PRN

## 2020-03-31 MED ORDER — CEFAZOLIN SODIUM-DEXTROSE 2-4 GM/100ML-% IV SOLN
INTRAVENOUS | Status: AC
  Filled 2020-03-31: qty 100

## 2020-03-31 MED ORDER — SODIUM CHLORIDE 0.9 % IV SOLN: INTRAVENOUS | Status: DC

## 2020-03-31 MED ORDER — ALUM & MAG HYDROXIDE-SIMETH 200-200-20 MG/5ML PO SUSP: 30.0000 mL | ORAL | Status: DC | PRN

## 2020-03-31 MED ORDER — ORAL CARE MOUTH RINSE: 15.0000 mL | Freq: Once | OROMUCOSAL | Status: AC

## 2020-03-31 MED ORDER — VITAMIN E 45 MG (100 UNIT) PO CAPS
400.0000 [IU] | ORAL_CAPSULE | Freq: Every day | ORAL | Status: DC
  Filled 2020-03-31: qty 4

## 2020-03-31 MED ORDER — OXYCODONE HCL 5 MG PO TABS: 5.0000 mg | ORAL_TABLET | ORAL | Status: DC | PRN

## 2020-03-31 MED ORDER — POLYETHYLENE GLYCOL 3350 17 G PO PACK
17.0000 g | PACK | Freq: Every day | ORAL | Status: DC | PRN
Start: 2020-03-31 — End: 2020-04-01

## 2020-03-31 MED ORDER — METHOCARBAMOL 1000 MG/10ML IJ SOLN
500.0000 mg | Freq: Four times a day (QID) | INTRAVENOUS | Status: DC | PRN
  Filled 2020-03-31: qty 5

## 2020-03-31 SURGICAL SUPPLY — 57 items
ARTICULEZE HEAD (Hips) ×2 IMPLANT
BENZOIN TINCTURE PRP APPL 2/3 (GAUZE/BANDAGES/DRESSINGS) ×2 IMPLANT
BLADE CLIPPER SURG (BLADE) IMPLANT
BLADE SAW SGTL 18X1.27X75 (BLADE) ×2 IMPLANT
COVER SURGICAL LIGHT HANDLE (MISCELLANEOUS) ×2 IMPLANT
COVER WAND RF STERILE (DRAPES) ×2 IMPLANT
CUP ACET PNNCL SECTR W/GRIP 56 (Hips) ×1 IMPLANT
DRAPE C-ARM 42X72 X-RAY (DRAPES) ×2 IMPLANT
DRAPE STERI IOBAN 125X83 (DRAPES) ×2 IMPLANT
DRAPE U-SHAPE 47X51 STRL (DRAPES) ×6 IMPLANT
DRSG AQUACEL AG ADV 3.5X10 (GAUZE/BANDAGES/DRESSINGS) ×2 IMPLANT
DURAPREP 26ML APPLICATOR (WOUND CARE) ×2 IMPLANT
ELECT BLADE 4.0 EZ CLEAN MEGAD (MISCELLANEOUS) ×2
ELECT BLADE 6.5 EXT (BLADE) IMPLANT
ELECT REM PT RETURN 9FT ADLT (ELECTROSURGICAL) ×2
ELECTRODE BLDE 4.0 EZ CLN MEGD (MISCELLANEOUS) ×1 IMPLANT
ELECTRODE REM PT RTRN 9FT ADLT (ELECTROSURGICAL) ×1 IMPLANT
FACESHIELD WRAPAROUND (MASK) ×4 IMPLANT
GAUZE XEROFORM 5X9 LF (GAUZE/BANDAGES/DRESSINGS) ×2 IMPLANT
GLOVE BIOGEL PI IND STRL 8 (GLOVE) ×2 IMPLANT
GLOVE BIOGEL PI INDICATOR 8 (GLOVE) ×2
GLOVE ECLIPSE 8.0 STRL XLNG CF (GLOVE) ×2 IMPLANT
GLOVE ORTHO TXT STRL SZ7.5 (GLOVE) ×4 IMPLANT
GOWN STRL REUS W/ TWL LRG LVL3 (GOWN DISPOSABLE) ×2 IMPLANT
GOWN STRL REUS W/ TWL XL LVL3 (GOWN DISPOSABLE) ×2 IMPLANT
GOWN STRL REUS W/TWL LRG LVL3 (GOWN DISPOSABLE) ×2
GOWN STRL REUS W/TWL XL LVL3 (GOWN DISPOSABLE) ×2
HANDPIECE INTERPULSE COAX TIP (DISPOSABLE) ×1
HEAD ARTICULEZE (Hips) ×1 IMPLANT
HEAD M SROM 36MM PLUS 1.5 (Hips) ×1 IMPLANT
KIT BASIN OR (CUSTOM PROCEDURE TRAY) ×2 IMPLANT
KIT TURNOVER KIT B (KITS) ×2 IMPLANT
MANIFOLD NEPTUNE II (INSTRUMENTS) ×2 IMPLANT
NS IRRIG 1000ML POUR BTL (IV SOLUTION) ×2 IMPLANT
PACK TOTAL JOINT (CUSTOM PROCEDURE TRAY) ×2 IMPLANT
PAD ARMBOARD 7.5X6 YLW CONV (MISCELLANEOUS) ×2 IMPLANT
PINN SECTOR W/GRIP ACE CUP 56 (Hips) ×2 IMPLANT
PINNACLE ALTRX PLUS 4 N 36X56 (Hips) ×2 IMPLANT
SET HNDPC FAN SPRY TIP SCT (DISPOSABLE) ×1 IMPLANT
SROM M HEAD 36MM PLUS 1.5 (Hips) ×2 IMPLANT
STAPLER VISISTAT 35W (STAPLE) IMPLANT
STEM FEM ACTIS HIGH SZ3 (Stem) ×2 IMPLANT
STRIP CLOSURE SKIN 1/2X4 (GAUZE/BANDAGES/DRESSINGS) ×4 IMPLANT
SUT ETHIBOND NAB CT1 #1 30IN (SUTURE) ×2 IMPLANT
SUT MNCRL AB 4-0 PS2 18 (SUTURE) IMPLANT
SUT VIC AB 0 CT1 27 (SUTURE) ×2
SUT VIC AB 0 CT1 27XBRD ANBCTR (SUTURE) ×2 IMPLANT
SUT VIC AB 1 CT1 27 (SUTURE) ×2
SUT VIC AB 1 CT1 27XBRD ANBCTR (SUTURE) ×2 IMPLANT
SUT VIC AB 2-0 CT1 27 (SUTURE) ×2
SUT VIC AB 2-0 CT1 TAPERPNT 27 (SUTURE) ×2 IMPLANT
TOWEL GREEN STERILE (TOWEL DISPOSABLE) ×2 IMPLANT
TOWEL GREEN STERILE FF (TOWEL DISPOSABLE) ×2 IMPLANT
TRAY CATH 16FR W/PLASTIC CATH (SET/KITS/TRAYS/PACK) IMPLANT
TRAY FOLEY W/BAG SLVR 16FR (SET/KITS/TRAYS/PACK)
TRAY FOLEY W/BAG SLVR 16FR ST (SET/KITS/TRAYS/PACK) IMPLANT
WATER STERILE IRR 1000ML POUR (IV SOLUTION) ×2 IMPLANT

## 2020-03-31 NOTE — H&P (Signed)
TOTAL HIP ADMISSION H&P  Patient is admitted for left total hip arthroplasty.  Subjective:  Chief Complaint: left hip pain  HPI: Marc [name], 64 y.o. male, has a history of pain and functional disability in the left hip(s) due to arthritis and patient has failed non-surgical conservative treatments for greater than 12 weeks to include NSAID's and/or analgesics, corticosteriod injections, flexibility and strengthening excercises, weight reduction as appropriate and activity modification.  Onset of symptoms was gradual starting 3 years ago with gradually worsening course since that time.The patient noted no past surgery on the left hip(s).  Patient currently rates pain in the left hip at 10 out of 10 with activity. Patient has night pain, worsening of pain with activity and weight bearing, trendelenberg gait, pain that interfers with activities of daily living and pain with passive range of motion. Patient has evidence of subchondral cysts, subchondral sclerosis, periarticular osteophytes and joint space narrowing by imaging studies. This condition presents safety issues increasing the risk of falls.  There is no current active infection.  Patient Active Problem List   Diagnosis Date Noted   Unilateral primary osteoarthritis, left hip 01/28/2020   Renal mass 01/05/2020   Past Medical History:  Diagnosis Date   Cancer [facility])    Kidney Cancer   Hyperlipidemia     Past Surgical History:  Procedure Laterality Date   right shoulder rotator cuff repair Right    ROBOT ASSISTED LAPAROSCOPIC NEPHRECTOMY Left 01/05/2020   Procedure: XI ROBOTIC ASSISTED LAPAROSCOPIC RADICAL NEPHRECTOMY;  Surgeon: Ceasar Mons, MD;  Location: [facility] ORS;  Service: Urology;  Laterality: Left;    Current Facility-Administered Medications  Medication Dose Route Frequency Provider Last Rate Last Admin   ceFAZolin (ANCEF) 2-4 GM/100ML-% IVPB            ceFAZolin (ANCEF) IVPB 2g/100 mL premix  2 g  Intravenous On Call to OR Pete Pelt, PA-C       lactated ringers infusion   Intravenous Continuous Roderic Palau, MD 10 mL/hr at 03/31/20 1036 New Bag at 03/31/20 1036   povidone-iodine 10 % swab 2 application  2 application Topical Once Erskine Emery W, PA-C       tranexamic acid (CYKLOKAPRON) 1000MG /145mL IVPB            tranexamic acid (CYKLOKAPRON) IVPB 1,000 mg  1,000 mg Intravenous To OR Pete Pelt, PA-C       No Known Allergies  Social History   Tobacco Use   Smoking status: Former Smoker    Quit date: 04/03/2015    Years since quitting: 4.9   Smokeless tobacco: Never Used  Substance Use Topics   Alcohol use: Yes    Comment: occas     History reviewed. No pertinent family history.   Review of Systems  All other systems reviewed and are negative.   Objective:  Physical Exam Vitals reviewed.  Constitutional:      Appearance: Normal appearance.  HENT:     Head: Normocephalic.  Eyes:     Extraocular Movements: Extraocular movements intact.     Pupils: Pupils are equal, round, and reactive to light.  Cardiovascular:     Rate and Rhythm: Normal rate.  Pulmonary:     Effort: Pulmonary effort is normal.     Breath sounds: Normal breath sounds.  Abdominal:     Palpations: Abdomen is soft.  Musculoskeletal:     Cervical back: Normal range of motion.     Left hip: Tenderness and bony tenderness  present. Decreased range of motion. Decreased strength.  Neurological:     Mental Status: He is alert and oriented to person, place, and time.  Psychiatric:        Behavior: Behavior normal.     Vital signs in last 24 hours: Temp:  [97.9 F (36.6 C)] 97.9 F (36.6 C) (12/30 1016) Pulse Rate:  [91] 91 (12/30 1016) Resp:  [18] 18 (12/30 1016) BP: (128)/(77) 128/77 (12/30 1016) SpO2:  [95 %] 95 % (12/30 1016) Weight:  [104.8 kg] 104.8 kg (12/30 1016)  Labs:   Estimated body mass index is 33.16 kg/m as calculated from the following:   Height  as of this encounter: 5\' 10"  (1.778 m).   Weight as of this encounter: 104.8 kg.   Imaging Review Plain radiographs demonstrate severe degenerative joint disease of the left hip(s). The bone quality appears to be excellent for age and reported activity level.      Assessment/Plan:  End stage arthritis, left hip(s)  The patient history, physical examination, clinical judgement of the provider and imaging studies are consistent with end stage degenerative joint disease of the left hip(s) and total hip arthroplasty is deemed medically necessary. The treatment options including medical management, injection therapy, arthroscopy and arthroplasty were discussed at length. The risks and benefits of total hip arthroplasty were presented and reviewed. The risks due to aseptic loosening, infection, stiffness, dislocation/subluxation,  thromboembolic complications and other imponderables were discussed.  The patient acknowledged the explanation, agreed to proceed with the plan and consent was signed. Patient is being admitted for inpatient treatment for surgery, pain control, PT, OT, prophylactic antibiotics, VTE prophylaxis, progressive ambulation and ADL's and discharge planning.The patient is planning to be discharged home with home health services

## 2020-03-31 NOTE — Transfer of Care (Signed)
Immediate Anesthesia Transfer of Care Note  Patient: Marc [name]  Procedure(s) Performed: LEFT TOTAL HIP ARTHROPLASTY ANTERIOR APPROACH (Left Hip)  Patient Location: PACU  Anesthesia Type:Spinal  Level of Consciousness: awake, alert  and oriented  Airway & Oxygen Therapy: Patient Spontanous Breathing and Patient connected to face mask oxygen  Post-op Assessment: Report given to RN and Post -op Vital signs reviewed and stable  Post vital signs: Reviewed and stable  Last Vitals:  Vitals Value Taken Time  BP 102/67 03/31/20 1416  Temp    Pulse 79 03/31/20 1419  Resp 9 03/31/20 1419  SpO2 96 % 03/31/20 1419  Vitals shown include unvalidated device data.  Last Pain:  Vitals:   03/31/20 1022  TempSrc:   PainSc: 0-No pain      Patients Stated Pain Goal: 2 (03/31/20 1022)  Complications: No complications documented.

## 2020-03-31 NOTE — Anesthesia Procedure Notes (Signed)
Spinal  Patient location during procedure: OR Start time: 03/31/2020 12:33 PM End time: 03/31/2020 12:37 PM Staffing Performed: anesthesiologist  Anesthesiologist: Lannie Fields, DO Preanesthetic Checklist Completed: patient identified, IV checked, risks and benefits discussed, surgical consent, monitors and equipment checked, pre-op evaluation and timeout performed Spinal Block Patient position: sitting Prep: DuraPrep and site prepped and draped Patient monitoring: cardiac monitor, continuous pulse ox and blood pressure Approach: midline Location: L3-4 Injection technique: single-shot Needle Needle type: Pencan  Needle gauge: 24 G Needle length: 9 cm Assessment Sensory level: T6 Additional Notes Functioning IV was confirmed and monitors were applied. Sterile prep and drape, including hand hygiene and sterile gloves were used. The patient was positioned and the spine was prepped. The skin was anesthetized with lidocaine.  Free flow of clear CSF was obtained prior to injecting local anesthetic into the CSF.  The spinal needle aspirated freely following injection.  The needle was carefully withdrawn.  The patient tolerated the procedure well.

## 2020-03-31 NOTE — Brief Op Note (Signed)
03/31/2020  2:03 PM  PATIENT:  Marc [name]  64 y.o. male  PRE-OPERATIVE DIAGNOSIS:  osteoarthritis left hip  POST-OPERATIVE DIAGNOSIS:  osteoarthritis left hip  PROCEDURE:  Procedure(s): LEFT TOTAL HIP ARTHROPLASTY ANTERIOR APPROACH (Left)  SURGEON:  Surgeon(s) and Role:    Kathryne Hitch, MD - Primary  PHYSICIAN ASSISTANT:  Rexene Edison, PA-C  ANESTHESIA:   spinal  EBL:  150 mL   COUNTS:  YES  DICTATION: .Other Dictation: Dictation Number [id number]  PLAN OF CARE: Admit for overnight observation  PATIENT DISPOSITION:  PACU - hemodynamically stable.   Delay start of Pharmacological VTE agent (>24hrs) due to surgical blood loss or risk of bleeding: no

## 2020-03-31 NOTE — Evaluation (Signed)
Physical Therapy Evaluation Patient Details Name: Marc [name] MRN: [id number] DOB: [birth year] Today's Date: 03/31/2020   History of Present Illness  64 y.o. male with PMH including kidney cancer, HLD, L hip OA. Pt underwent L THA on 03/31/2020.  Clinical Impression  Pt presents to PT with deficits in sensation, strength, power, gait, balance, functional mobility. Pt with L hip weakness and numbness after recent THA. Pt reports instability and requires BUE support of RW to maintain balance with standing and gait. Pt will benefit from aggressive mobilization and PT POC to improve balance and gait while reducing falls risk. PT recommends the pt receive a RW at the time of discharge as well as HHPT.    Follow Up Recommendations Follow surgeon's recommendation for DC plan and follow-up therapies (anticipate HHPT initially)    Equipment Recommendations  Rolling walker with 5" wheels    Recommendations for Other Services       Precautions / Restrictions Precautions Precautions: Fall Restrictions Weight Bearing Restrictions: No      Mobility  Bed Mobility Overal bed mobility: Needs Assistance Bed Mobility: Supine to Sit;Sit to Supine     Supine to sit: Supervision Sit to supine: Supervision        Transfers Overall transfer level: Needs assistance Equipment used: Rolling walker (2 wheeled) Transfers: Sit to/from Stand Sit to Stand: Min guard;From elevated surface            Ambulation/Gait Ambulation/Gait assistance: Min guard Gait Distance (Feet): 40 Feet Assistive device: Rolling walker (2 wheeled) Gait Pattern/deviations: Step-to pattern Gait velocity: reduced Gait velocity interpretation: <1.31 ft/sec, indicative of household ambulator General Gait Details: pt with short step-to gait, reduced step length and stance time on LLE  Stairs            Wheelchair Mobility    Modified Rankin (Stroke Patients Only)       Balance Overall balance  assessment: Needs assistance Sitting-balance support: No upper extremity supported;Feet supported Sitting balance-Leahy Scale: Good     Standing balance support: Bilateral upper extremity supported Standing balance-Leahy Scale: Poor Standing balance comment: reliant on BUE support of RW                             Pertinent Vitals/Pain Pain Assessment: 0-10 Pain Score: 3  Pain Location: L hip Pain Descriptors / Indicators: Burning Pain Intervention(s): Monitored during session    Home Living Family/patient expects to be discharged to:: Private residence Living Arrangements: Spouse/significant other;Children Available Help at Discharge: Family;Available 24 hours/day Type of Home: House Home Access: Level entry     Home Layout: Two level;Able to live on main level with bedroom/bathroom Home Equipment: None      Prior Function Level of Independence: Independent               Hand Dominance        Extremity/Trunk Assessment   Upper Extremity Assessment Upper Extremity Assessment: Overall WFL for tasks assessed    Lower Extremity Assessment Lower Extremity Assessment: Generalized weakness;LLE deficits/detail LLE Deficits / Details: 3/5 hip flexion, 4/5 PF/DF LLE Sensation: decreased light touch    Cervical / Trunk Assessment Cervical / Trunk Assessment: Normal  Communication   Communication: No difficulties  Cognition Arousal/Alertness: Awake/alert Behavior During Therapy: WFL for tasks assessed/performed Overall Cognitive Status: Within Functional Limits for tasks assessed  General Comments General comments (skin integrity, edema, etc.): VSS on RA    Exercises     Assessment/Plan    PT Assessment Patient needs continued PT services  PT Problem List Decreased strength;Decreased activity tolerance;Decreased balance;Decreased mobility;Decreased knowledge of use of DME;Pain;Impaired  sensation       PT Treatment Interventions DME instruction;Gait training;Stair training;Functional mobility training;Therapeutic activities;Therapeutic exercise;Balance training;Neuromuscular re-education;Patient/family education    PT Goals (Current goals can be found in the Care Plan section)  Acute Rehab PT Goals Patient Stated Goal: to return to independent mobility PT Goal Formulation: With patient Time For Goal Achievement: 04/14/20 Potential to Achieve Goals: Good    Frequency 7X/week   Barriers to discharge        Co-evaluation               AM-PAC PT "6 Clicks" Mobility  Outcome Measure Help needed turning from your back to your side while in a flat bed without using bedrails?: A Little Help needed moving from lying on your back to sitting on the side of a flat bed without using bedrails?: A Little Help needed moving to and from a bed to a chair (including a wheelchair)?: A Little Help needed standing up from a chair using your arms (e.g., wheelchair or bedside chair)?: A Little Help needed to walk in hospital room?: A Little Help needed climbing 3-5 steps with a railing? : A Lot 6 Click Score: 17    End of Session   Activity Tolerance: Patient tolerated treatment well Patient left: in bed;with call bell/phone within reach;with family/visitor present Nurse Communication: Mobility status PT Visit Diagnosis: Unsteadiness on feet (R26.81);Other abnormalities of gait and mobility (R26.89);Muscle weakness (generalized) (M62.81);Other symptoms and signs involving the nervous system (R29.898)    Time: 0762-2633 PT Time Calculation (min) (ACUTE ONLY): 19 min   Charges:   PT Evaluation $PT Eval Low Complexity: 1 Low          Arlyss Gandy, PT, DPT Acute Rehabilitation Pager: [phone]   Arlyss Gandy 03/31/2020, 5:45 PM

## 2020-03-31 NOTE — Progress Notes (Signed)
Orthopedic Tech Progress Note Patient Details:  Marc [name] Mar 17, 1956 [id number]      Post Interventions Patient Tolerated: Well Instructions Provided: Care of device,Poper ambulation with device   [name] 03/31/2020, 6:08 PM

## 2020-03-31 NOTE — Anesthesia Preprocedure Evaluation (Addendum)
Anesthesia Evaluation  Patient identified by MRN, date of birth, ID band Patient awake    Reviewed: Allergy & Precautions, NPO status , Patient's Chart, lab work & pertinent test results  History of Anesthesia Complications Negative for: history of anesthetic complications  Airway Mallampati: II  TM Distance: >3 FB Neck ROM: Full    Dental  (+) Dental Advisory Given, Implants, Caps   Pulmonary Patient abstained from smoking., former smoker,    Pulmonary exam normal        Cardiovascular negative cardio ROS Normal cardiovascular exam     Neuro/Psych negative neurological ROS  negative psych ROS   GI/Hepatic negative GI ROS, Neg liver ROS,   Endo/Other   Obesity   Renal/GU  S/p nephrectomy for RCC      Musculoskeletal  (+) Arthritis ,   Abdominal   Peds  Hematology negative hematology ROS (+)   Anesthesia Other Findings Covid test negative   Reproductive/Obstetrics                            Anesthesia Physical Anesthesia Plan  ASA: II  Anesthesia Plan: Spinal   Post-op Pain Management:    Induction:   PONV Risk Score and Plan: 1 and Treatment may vary due to age or medical condition and Propofol infusion  Airway Management Planned: Natural Airway and Simple Face Mask  Additional Equipment: None  Intra-op Plan:   Post-operative Plan:   Informed Consent: I have reviewed the patients History and Physical, chart, labs and discussed the procedure including the risks, benefits and alternatives for the proposed anesthesia with the patient or authorized representative who has indicated his/her understanding and acceptance.       Plan Discussed with: CRNA and Anesthesiologist  Anesthesia Plan Comments: (Labs reviewed, platelets acceptable. Discussed risks and benefits of spinal, including spinal/epidural hematoma, infection, failed block, and PDPH. Patient expressed  understanding and wished to proceed. )       Anesthesia Quick Evaluation

## 2020-03-31 NOTE — Anesthesia Procedure Notes (Signed)
Procedure Name: MAC Date/Time: 03/31/2020 12:30 PM Performed by: Trinna Post., CRNA Pre-anesthesia Checklist: Patient identified, Emergency Drugs available, Suction available, Patient being monitored and Timeout performed Patient Re-evaluated:Patient Re-evaluated prior to induction Oxygen Delivery Method: Simple face mask Preoxygenation: Pre-oxygenation with 100% oxygen Induction Type: IV induction Placement Confirmation: positive ETCO2

## 2020-03-31 NOTE — Anesthesia Postprocedure Evaluation (Signed)
Anesthesia Post Note  Patient: Marc [name]  Procedure(s) Performed: LEFT TOTAL HIP ARTHROPLASTY ANTERIOR APPROACH (Left Hip)     Patient location during evaluation: PACU Anesthesia Type: Spinal Level of consciousness: awake and alert Pain management: pain level controlled Vital Signs Assessment: post-procedure vital signs reviewed and stable Respiratory status: spontaneous breathing and respiratory function stable Cardiovascular status: blood pressure returned to baseline and stable Postop Assessment: spinal receding and no apparent nausea or vomiting Anesthetic complications: no   No complications documented.  Last Vitals:  Vitals:   03/31/20 1530 03/31/20 1545  BP: 122/79 120/82  Pulse: 74 71  Resp: 16 16  Temp:    SpO2: 96% 98%    Last Pain:  Vitals:   03/31/20 1545  TempSrc:   PainSc: 0-No pain    LLE Motor Response: No movement due to regional block (03/31/20 1545) LLE Sensation: Numbness (03/31/20 1545)     L Sensory Level: S1-Sole of foot, small toes (03/31/20 1545)    Beryle Lathe

## 2020-04-01 DIAGNOSIS — M1612 Unilateral primary osteoarthritis, left hip: Secondary | ICD-10-CM | POA: Diagnosis not present

## 2020-04-01 LAB — CBC
HCT: 36.8 % — ABNORMAL LOW (ref 39.0–52.0)
Hemoglobin: 12.2 g/dL — ABNORMAL LOW (ref 13.0–17.0)
MCH: 28.4 pg (ref 26.0–34.0)
MCHC: 33.2 g/dL (ref 30.0–36.0)
MCV: 85.6 fL (ref 80.0–100.0)
Platelets: 380 10*3/uL (ref 150–400)
RBC: 4.3 MIL/uL (ref 4.22–5.81)
RDW: 14.7 % (ref 11.5–15.5)
WBC: 17.9 10*3/uL — ABNORMAL HIGH (ref 4.0–10.5)
nRBC: 0 % (ref 0.0–0.2)

## 2020-04-01 LAB — BASIC METABOLIC PANEL
Anion gap: 9 (ref 5–15)
BUN: 14 mg/dL (ref 8–23)
CO2: 25 mmol/L (ref 22–32)
Calcium: 8.9 mg/dL (ref 8.9–10.3)
Chloride: 101 mmol/L (ref 98–111)
Creatinine, Ser: 1.03 mg/dL (ref 0.61–1.24)
GFR, Estimated: >60 mL/min (ref >60)
Glucose, Bld: 127 mg/dL — ABNORMAL HIGH (ref 70–99)
Potassium: 4.4 mmol/L (ref 3.5–5.1)
Sodium: 135 mmol/L (ref 135–145)

## 2020-04-01 MED ORDER — METHOCARBAMOL 500 MG PO TABS: 500.0000 mg | ORAL_TABLET | Freq: Four times a day (QID) | ORAL | 1 refills | Status: DC | PRN

## 2020-04-01 MED ORDER — ASPIRIN 81 MG PO CHEW: 81.0000 mg | CHEWABLE_TABLET | Freq: Two times a day (BID) | ORAL | 0 refills | Status: DC

## 2020-04-01 MED ORDER — OXYCODONE HCL 5 MG PO TABS: 5.0000 mg | ORAL_TABLET | ORAL | 0 refills | Status: DC | PRN

## 2020-04-01 NOTE — Discharge Summary (Signed)
Patient ID: Marc [name] MRN: [id number] DOB/AGE: Mar 31, 1956 64 y.o.  Admit date: 03/31/2020 Discharge date: 04/01/2020  Admission Diagnoses:  Principal Problem:   Unilateral primary osteoarthritis, left hip Active Problems:   Status post total replacement of left hip   Discharge Diagnoses:  Same  Past Medical History:  Diagnosis Date  . Cancer [facility])    Kidney Cancer  . Hyperlipidemia     Surgeries: Procedure(s): LEFT TOTAL HIP ARTHROPLASTY ANTERIOR APPROACH on 03/31/2020   Consultants:   Discharged Condition: Improved  Hospital Course: Marc [name] is an 64 y.o. male who was admitted 03/31/2020 for operative treatment ofUnilateral primary osteoarthritis, left hip. Patient has severe unremitting pain that affects sleep, daily activities, and work/hobbies. After pre-op clearance the patient was taken to the operating room on 03/31/2020 and underwent  Procedure(s): LEFT TOTAL HIP ARTHROPLASTY ANTERIOR APPROACH.    Patient was given perioperative antibiotics:  Anti-infectives (From admission, onward)   Start     Dose/Rate Route Frequency Ordered Stop   03/31/20 1800  ceFAZolin (ANCEF) IVPB 1 g/50 mL premix        1 g 100 mL/hr over 30 Minutes Intravenous Every 6 hours 03/31/20 1642 04/01/20 0933   03/31/20 1130  ceFAZolin (ANCEF) IVPB 2g/100 mL premix        2 g 200 mL/hr over 30 Minutes Intravenous On call to O.R. 03/31/20 0956 03/31/20 1310   03/31/20 1003  ceFAZolin (ANCEF) 2-4 GM/100ML-% IVPB       Note to Pharmacy: Laurita Quint   : cabinet override      03/31/20 1003 03/31/20 1241       Patient was given sequential compression devices, early ambulation, and chemoprophylaxis to prevent DVT.  Patient benefited maximally from hospital stay and there were no complications.    Recent vital signs:  Patient Vitals for the past 24 hrs:  BP Temp Temp src Pulse Resp SpO2  04/01/20 0754 132/79 98.3 F (36.8 C) Oral 97 17 96 %  04/01/20 0512 114/77 98.2 F (36.8 C)  Oral 93 20 98 %  04/01/20 0002 125/76 98 F (36.7 C) Oral 89 20 97 %  03/31/20 2050 (!) 141/82 98.2 F (36.8 C) Oral 89 18 96 %     Recent laboratory studies:  Recent Labs    03/31/20 1657 04/01/20 0251  WBC  --  17.9*  HGB  --  12.2*  HCT  --  36.8*  PLT  --  380  NA  --  135  K  --  4.4  CL  --  101  CO2  --  25  BUN  --  14  CREATININE 1.05 1.03  GLUCOSE  --  127*  CALCIUM  --  8.9     Discharge Medications:   Allergies as of 04/01/2020   No Known Allergies     Medication List    STOP taking these medications   aspirin EC 81 MG tablet Replaced by: aspirin 81 MG chewable tablet     TAKE these medications   ascorbic acid 500 MG tablet Commonly known as: VITAMIN C Take 500 mg by mouth daily.   aspirin 81 MG chewable tablet Chew 1 tablet (81 mg total) by mouth 2 (two) times daily. Replaces: aspirin EC 81 MG tablet   atorvastatin 10 MG tablet Commonly known as: LIPITOR Take 10 mg by mouth at bedtime.   cholecalciferol 25 MCG (1000 UNIT) tablet Commonly known as: VITAMIN D3 Take 1,000 Units by mouth daily.  diclofenac 75 MG EC tablet Commonly known as: VOLTAREN TAKE 1 TABLET BY MOUTH TWICE A DAY   Fish Oil 1000 MG Caps Take 1,000 mg by mouth daily.   IRON SLOW RELEASE PO Take 1 tablet by mouth daily.   methocarbamol 500 MG tablet Commonly known as: ROBAXIN Take 1 tablet (500 mg total) by mouth every 6 (six) hours as needed for muscle spasms.   multivitamin with minerals tablet Take 1 tablet by mouth daily.   oxyCODONE 5 MG immediate release tablet Commonly known as: Oxy IR/ROXICODONE Take 1-2 tablets (5-10 mg total) by mouth every 4 (four) hours as needed for moderate pain (pain score 4-6).   vitamin E 180 MG (400 UNITS) capsule Take 400 Units by mouth daily.       Diagnostic Studies: DG Pelvis Portable  Result Date: 03/31/2020 CLINICAL DATA:  64 year old male postop EXAM: PORTABLE PELVIS 1-2 VIEWS COMPARISON:  01/28/2020 FINDINGS:  Surgical changes of left hip arthroplasty. Gas in the surgical bed. No fracture. Components appear aligned. Mild degenerative changes of the right hip. IMPRESSION: Early surgical changes of left hip arthroplasty. Electronically Signed   By: Gilmer Mor D.O.   On: 03/31/2020 14:38   DG C-Arm 1-60 Min  Result Date: 03/31/2020 CLINICAL DATA:  Left total hip arthroplasty. EXAM: OPERATIVE left HIP (WITH PELVIS IF PERFORMED) 8 VIEWS TECHNIQUE: Fluoroscopic spot image(s) were submitted for interpretation post-operatively. COMPARISON:  CT scan from August 2021. FINDINGS: Fluoroscopic spot images demonstrate left total hip arthroplasty components to be in good position without complicating features. IMPRESSION: Left total hip arthroplasty components in good position without complicating features. Electronically Signed   By: Rudie Meyer M.D.   On: 03/31/2020 14:15   DG HIP OPERATIVE UNILAT W OR W/O PELVIS LEFT  Result Date: 03/31/2020 CLINICAL DATA:  Left total hip arthroplasty. EXAM: OPERATIVE left HIP (WITH PELVIS IF PERFORMED) 8 VIEWS TECHNIQUE: Fluoroscopic spot image(s) were submitted for interpretation post-operatively. COMPARISON:  CT scan from August 2021. FINDINGS: Fluoroscopic spot images demonstrate left total hip arthroplasty components to be in good position without complicating features. IMPRESSION: Left total hip arthroplasty components in good position without complicating features. Electronically Signed   By: Rudie Meyer M.D.   On: 03/31/2020 14:15    Disposition: Discharge disposition: 01-Home or Self Care          Follow-up Information    Kathryne Hitch, MD Follow up in 2 week(s).   Specialty: Orthopedic Surgery Contact information: [address] [place] [address] [phone]                Signed: Kathryne Hitch 04/01/2020, 5:53 PM

## 2020-04-01 NOTE — Op Note (Signed)
NAME: SHANN, MERRICK MEDICAL RECORD [id number] ACCOUNT [id number] DATE OF BIRTH:[birth year] FACILITY: [facility] LOCATION: [facility] PHYSICIAN:Ephram Kornegay Aretha Parrot, MD  OPERATIVE REPORT  DATE OF PROCEDURE:  03/31/2020  PREOPERATIVE DIAGNOSIS:  End-stage arthritis and degenerative joint disease, left hip.  POSTOPERATIVE DIAGNOSIS:  End-stage arthritis and degenerative joint disease, left hip.  PROCEDURE:  Left total hip arthroplasty through direct anterior approach.  IMPLANTS:  DePuy Sector Gription acetabular component size 56, size 36+4 neutral polyethylene liner, size 3 Actis femoral component with high offset, size 36+5 metal hip ball.  SURGEON:  Vanita Panda. Magnus Ivan, MD  ASSISTANT:  Richardean Canal, PA-C.  ANESTHESIA:  Spinal.  ANTIBIOTICS:  Two grams IV Ancef.  ESTIMATED BLOOD LOSS:  150 mL.  COMPLICATIONS:  None.  INDICATIONS:  The patient is a 64 year old gentleman well known to me.  He has had debilitating arthritis and left hip pain for some time now.  We have x-ray'd his hip remotely in the past and it showed end-stage arthritis.  He was going to prepare for a  total hip arthroplasty on the left side.  He then unfortunately developed a tumor with his kidney and required him to have his kidney removed.  He has since recovered from that.  At this point, his pain with his left hip is daily.  His x-rays show  complete end-stage arthritis of the left hip.  At this point, his left hip pain is detrimentally affecting his mobility, his quality of life and his activities of daily living to the point he does wish to proceed with a total hip arthroplasty on the left  side.  We did explain to him in detail the risk of acute blood loss anemia, nerve or vessel injury, fracture, infection, dislocation, DVT and implant failure.  We talked about the goals being decreased pain, improve mobility and overall improve quality  of life.  DESCRIPTION OF PROCEDURE:  After informed consent was  obtained and appropriate left hip was marked.  He was brought to the operating room and sat up on a stretcher where spinal anesthesia was obtained.  He was laid in supine position on a stretcher.   Foley catheter was placed.  We were able to assess his leg lengths and he is actually longer on this left operative side and he states that he knows he is long on that side as well.  He understands that we cannot shorten him.  There is a slight flexion  contracture of his opposite knee and probably some disease in that knee.  Traction boots were placed on both his feet.  Next, he was placed supine on the Hana fracture table with a peroneal post in place and both legs in line skeletal traction device and  no traction applied.  His left operative hip was prepped and draped with DuraPrep and sterile drapes.  A time-out was called and he was identified as correct patient, correct left hip.  We then made an incision just inferior and posterior to the  anterior superior iliac spine and carried this obliquely down the leg.  We dissected down tensor fascia lata muscle.  Tensor fascia was then divided longitudinally to proceed with direct anterior approach to the hip.  We identified and cauterized  circumflex vessels and identified the hip capsule, opened up the hip capsule in an L-type format, finding moderate joint effusion and significant periarticular osteophytes around the lateral femoral head and neck.  We placed curved retractors within the  joint capsule and made our femoral neck cut  with oscillating saw just proximal to the lesser trochanter and completed this with an osteotome.  I placed a corkscrew guide in the femoral head and removed the femoral head in its entirety and found a wide  area devoid of cartilage completely.  I then removed remnants of the acetabular labrum and other debris from the acetabulum.  I placed a bent Hohmann over the acetabular rim and removed some periarticular osteophytes as well.  I  then began reaming under  direct visualization from a size 43 reamer in stepwise increments going up to a size 55.  With all reamers under direct visualization, the last reamer was placed under direct fluoroscopy, so we could obtain our depth in reaming, our inclination and  anteversion.  I then placed the real DePuy Sector Gription acetabular component size 56 and a 36+4 neutral polyethylene liner for that size acetabular component.  Attention was then turned to the femur.  With the leg externally rotated to 120 degrees,  extended and adducted, we replaced Mueller retractor medially and Hohmann retractor above the greater trochanter.  We released lateral joint capsule and used a box-cutting osteotome to enter the femoral canal and a rongeur to lateralize, then began  broaching using the Actis broaching system from a size 0 going up to a size 3.  With a size 3 in place, we trialed a standard offset femoral neck and 32+15 hip ball.  We felt like we just needed a little bit more offset.  We dislocated the hip and  removed the trial components.  We placed a high offset femoral component based on his anatomy #3 Actis with a 36+15 hip ball.  However, once we reduced in the acetabulum, it was definitely not as stable as I wanted to be with extreme of external  rotation.  I dislocated the hip and removed the 36+1.5 metal hip ball and went up to a 36+5 metal hip ball, reduced this in the acetabulum, and I was very pleased with stability and leg length and offset after that.  We assessed this mechanically and  radiographically.  I then irrigated the hip with normal saline solution using pulsatile lavage.  We closed the joint capsule with interrupted #1 Ethibond suture, followed by #1 Vicryl to close the tensor fascia, 0 Vicryl was used to close deep tissue and  2-0 Vicryl was used to close subcutaneous tissue.  The staples were used to close the skin.  Aquacel dressing was applied.  He was taken off the Hana table  and taken to recovery room in stable condition with all final counts being correct.  There were  no complications noted.  Of note, Benita Stabile, PA-C, assisted during the entire case and assistance was crucial for facilitating all aspects of this case.  HN/NUANCE  D:03/31/2020 T:04/01/2020 JOB:[id number]

## 2020-04-01 NOTE — Progress Notes (Signed)
Patient is discharged from room 3C07 at this time. Alert and in stable condition. IV site d/c'd and instructions read to patient with understanding verbalized and all questions answered. Left unit via wheelchair with all belongings at side. 

## 2020-04-01 NOTE — Progress Notes (Signed)
Physical Therapy Treatment Patient Details Name: Marc [name] MRN: [id number] DOB: Sep 07, 1955 Today's Date: 04/01/2020    History of Present Illness 64 y.o. male with PMH including kidney cancer, HLD, L hip OA. Pt underwent L THA on 03/31/2020.    PT Comments    Patient received in bed, agrees to PT session. Patient reports mild pain at this time. He performed bed mobility with mod independence, use of bed rails and trapeze. He performed transfer with supervision from slightly elevated bed. Patient ambulated 200 feet with RW, supervision. He will continue to benefit from skilled PT while here to improve functional mobility and independence.     Follow Up Recommendations  Follow surgeon's recommendation for DC plan and follow-up therapies     Equipment Recommendations  Rolling walker with 5" wheels    Recommendations for Other Services       Precautions / Restrictions Precautions Precautions: Fall Restrictions Weight Bearing Restrictions: No    Mobility  Bed Mobility Overal bed mobility: Modified Independent Bed Mobility: Supine to Sit     Supine to sit: Modified independent (Device/Increase time)        Transfers Overall transfer level: Needs assistance Equipment used: Rolling walker (2 wheeled) Transfers: Sit to/from Stand Sit to Stand: From elevated surface;Supervision            Ambulation/Gait Ambulation/Gait assistance: Supervision Gait Distance (Feet): 200 Feet Assistive device: Rolling walker (2 wheeled) Gait Pattern/deviations: Step-through pattern;Decreased weight shift to left Gait velocity: reduced   General Gait Details: reduced hip extension and knee extension on left during gait   Stairs             Wheelchair Mobility    Modified Rankin (Stroke Patients Only)       Balance Overall balance assessment: Needs assistance Sitting-balance support: Feet supported Sitting balance-Leahy Scale: Good     Standing balance support:  Bilateral upper extremity supported;During functional activity Standing balance-Leahy Scale: Fair Standing balance comment: reliant on BUE support of RW                            Cognition Arousal/Alertness: Awake/alert Behavior During Therapy: WFL for tasks assessed/performed Overall Cognitive Status: Within Functional Limits for tasks assessed                                        Exercises Total Joint Exercises Ankle Circles/Pumps: AROM;10 reps;Both Quad Sets: AROM;Both;10 reps Gluteal Sets: AROM;Both;10 reps Hip ABduction/ADduction: AAROM;Left;10 reps    General Comments        Pertinent Vitals/Pain Pain Assessment: 0-10 Pain Score: 2  Pain Location: L hip Pain Descriptors / Indicators: Burning;Sore;Tightness Pain Intervention(s): Monitored during session;Repositioned    Home Living                      Prior Function            PT Goals (current goals can now be found in the care plan section) Acute Rehab PT Goals Patient Stated Goal: to return to independent mobility PT Goal Formulation: With patient Time For Goal Achievement: 04/14/20 Potential to Achieve Goals: Good Progress towards PT goals: Progressing toward goals    Frequency    7X/week      PT Plan Current plan remains appropriate    Co-evaluation  AM-PAC PT "6 Clicks" Mobility   Outcome Measure  Help needed turning from your back to your side while in a flat bed without using bedrails?: A Little Help needed moving from lying on your back to sitting on the side of a flat bed without using bedrails?: A Little Help needed moving to and from a bed to a chair (including a wheelchair)?: A Little Help needed standing up from a chair using your arms (e.g., wheelchair or bedside chair)?: A Little Help needed to walk in hospital room?: A Little Help needed climbing 3-5 steps with a railing? : A Little 6 Click Score: 18    End of Session  Equipment Utilized During Treatment: Gait belt Activity Tolerance: Patient tolerated treatment well Patient left: Other (comment);with call bell/phone within reach (seated on side of bed) Nurse Communication: Mobility status PT Visit Diagnosis: Unsteadiness on feet (R26.81);Other abnormalities of gait and mobility (R26.89);Muscle weakness (generalized) (M62.81);Other symptoms and signs involving the nervous system (R29.898)     Time: [id number] PT Time Calculation (min) (ACUTE ONLY): 21 min  Charges:  $Gait Training: 8-22 mins                     Kristyn Conetta, PT, GCS 04/01/20,10:04 AM

## 2020-04-01 NOTE — Discharge Instructions (Signed)

## 2020-04-04 ENCOUNTER — Encounter (HOSPITAL_COMMUNITY): Payer: Self-pay | Admitting: Orthopaedic Surgery

## 2020-04-14 ENCOUNTER — Ambulatory Visit (INDEPENDENT_AMBULATORY_CARE_PROVIDER_SITE_OTHER): Payer: 59 | Admitting: Orthopaedic Surgery

## 2020-04-14 ENCOUNTER — Encounter: Payer: Self-pay | Admitting: Orthopaedic Surgery

## 2020-04-14 ENCOUNTER — Telehealth: Payer: Self-pay | Admitting: Orthopaedic Surgery

## 2020-04-14 DIAGNOSIS — Z96642 Presence of left artificial hip joint: Secondary | ICD-10-CM

## 2020-04-14 NOTE — Telephone Encounter (Signed)
Pt would like to know how much longer his leg is than the other and he wears a lift in his other shoe and wants to make sure that's ok?  [phone]

## 2020-04-14 NOTE — Progress Notes (Signed)
The patient is 2 weeks tomorrow status post a left total hip arthroplasty.  He is already driving and walking without an assistive device.  He stopped his pain medications 2 days ago.  Examination of his left hip shows the incision looks good.  I remove the staples in place Steri-Strips.  There is some hematoma but no seroma.  His leg lengths appear equal.  He will continue to increase his activities as comfort allows.  He will go back to his 1 aspirin daily.  I would like to see him back in 4 weeks to see how he is doing overall but no x-rays are needed.

## 2020-04-14 NOTE — Telephone Encounter (Signed)
I think actually have his leg lengths now closer to equal.  I am not sure he needs a lift anymore.  Have him bring that back up to me at his next visit so I can get a better assessment of his leg lengths.  I do feel like we have been close to being equal.

## 2020-04-15 NOTE — Telephone Encounter (Signed)
Patient aware of the below message  

## 2020-04-22 ENCOUNTER — Other Ambulatory Visit: Payer: Self-pay | Admitting: Orthopaedic Surgery

## 2020-05-12 ENCOUNTER — Ambulatory Visit (INDEPENDENT_AMBULATORY_CARE_PROVIDER_SITE_OTHER): Payer: 59 | Admitting: Orthopaedic Surgery

## 2020-05-12 ENCOUNTER — Encounter: Payer: Self-pay | Admitting: Orthopaedic Surgery

## 2020-05-12 DIAGNOSIS — Z96642 Presence of left artificial hip joint: Secondary | ICD-10-CM

## 2020-05-12 MED ORDER — METHYLPREDNISOLONE 4 MG PO TABS: ORAL_TABLET | ORAL | 0 refills | Status: DC

## 2020-05-12 NOTE — Progress Notes (Signed)
The patient is following up after having surgery at the end of December on his left hip with a total hip arthroplasty.  It has been about 6 weeks since surgery.  He has a little bit of pain in the left hip when he first gets up but he walks it off easily.  He does take diclofenac occasionally for knee pain on the left knee but he tries to limit his anti-inflammatories due to chronic renal insufficiency.  His left operative hip moves smoothly.  It looks good overall.  I would like to send in a steroid taper to help with his knee pain.  We will see him back in 4 weeks.  At that visit I would like actually a standing AP and lateral of both knees.  We certainly can go ahead and take an AP pelvis at that visit as well.  With his chronic knee pain this would be appropriate to x-ray the knees at that standpoint.

## 2020-06-09 ENCOUNTER — Telehealth: Payer: Self-pay

## 2020-06-09 ENCOUNTER — Ambulatory Visit (INDEPENDENT_AMBULATORY_CARE_PROVIDER_SITE_OTHER): Payer: 59 | Admitting: Orthopaedic Surgery

## 2020-06-09 ENCOUNTER — Other Ambulatory Visit: Payer: Self-pay

## 2020-06-09 ENCOUNTER — Encounter: Payer: Self-pay | Admitting: Orthopaedic Surgery

## 2020-06-09 ENCOUNTER — Ambulatory Visit: Payer: Self-pay

## 2020-06-09 DIAGNOSIS — M25562 Pain in left knee: Secondary | ICD-10-CM | POA: Diagnosis not present

## 2020-06-09 DIAGNOSIS — M1712 Unilateral primary osteoarthritis, left knee: Secondary | ICD-10-CM | POA: Insufficient documentation

## 2020-06-09 DIAGNOSIS — G8929 Other chronic pain: Secondary | ICD-10-CM | POA: Diagnosis not present

## 2020-06-09 DIAGNOSIS — Z96642 Presence of left artificial hip joint: Secondary | ICD-10-CM | POA: Diagnosis not present

## 2020-06-09 DIAGNOSIS — M1711 Unilateral primary osteoarthritis, right knee: Secondary | ICD-10-CM

## 2020-06-09 NOTE — Telephone Encounter (Signed)
Noted  

## 2020-06-09 NOTE — Progress Notes (Signed)
The patient is a 65 year old gentleman who is now 2 and half months status post a left total hip arthroplasty.  He says left hip is doing very well.  His knees are having pain which alternate with sometimes the right knee hurting more so left.  Occasional left knee hurts worse.  Today his right knee is hurting.  He denies any locking catching and is more of an aching pain and stiffness when he is doing a lot of activities.  He is having a harder time coming downstairs with the left knee but that made be attributed to his recent left hip replacement.  He is never had surgery on either knee.  Examination of both knees shows no effusion.  The right knee has some lateral tenderness and some medial as well as the left knee.  Both knees have patellofemoral crepitation and are ligamentously stable.  His right hip moves normally.  His left operative hip is just a little bit stiff.  An AP pelvis and lateral of the left hip shows a well-seated total hip arthroplasty.  X-rays of both knees shows moderate tricompartmental arthritic findings.  Both knees have severe patellofemoral fatty changes.  I do feel that he is a good candidate for hyaluronic acid to treat the pain from osteoarthritis in both knees.  I have shown him quad strengthening exercises to try as well.  I will see him back in 4 weeks to hopefully place hyaluronic acid in both knees.  I did give him a handout about this as well.

## 2020-06-09 NOTE — Telephone Encounter (Signed)
Bilateral knee gel injections 

## 2020-06-13 ENCOUNTER — Telehealth: Payer: Self-pay

## 2020-06-13 NOTE — Telephone Encounter (Signed)
Submitted VOB for Durolane, bilateral knee. Pending BV. 

## 2020-06-23 ENCOUNTER — Telehealth: Payer: Self-pay

## 2020-06-23 NOTE — Telephone Encounter (Signed)
Approved for Durolane, bilateral knee. [place] and OOP have been met Covered at 100% of the allowable. No Co-pay No PA required  Appt. 07/07/2020 with Dr. Ninfa Linden

## 2020-07-05 ENCOUNTER — Other Ambulatory Visit: Payer: Self-pay | Admitting: Orthopaedic Surgery

## 2020-07-07 ENCOUNTER — Encounter: Payer: Self-pay | Admitting: Orthopaedic Surgery

## 2020-07-07 ENCOUNTER — Ambulatory Visit (INDEPENDENT_AMBULATORY_CARE_PROVIDER_SITE_OTHER): Payer: 59 | Admitting: Orthopaedic Surgery

## 2020-07-07 DIAGNOSIS — M1711 Unilateral primary osteoarthritis, right knee: Secondary | ICD-10-CM

## 2020-07-07 DIAGNOSIS — M1712 Unilateral primary osteoarthritis, left knee: Secondary | ICD-10-CM

## 2020-07-07 MED ORDER — SODIUM HYALURONATE 60 MG/3ML IX PRSY
60.0000 mg | PREFILLED_SYRINGE | INTRA_ARTICULAR | Status: AC | PRN
  Administered 2020-07-07: 60 mg via INTRA_ARTICULAR

## 2020-07-07 MED ORDER — METHYLPREDNISOLONE ACETATE 40 MG/ML IJ SUSP
40.0000 mg | INTRAMUSCULAR | Status: AC | PRN
  Administered 2020-07-07: 40 mg via INTRA_ARTICULAR

## 2020-07-07 NOTE — Progress Notes (Signed)
   Procedure Note  Patient: Marc [name]             Date of Birth: 10-15-1955           MRN: [id number]             Visit Date: 07/07/2020  Procedures: Visit Diagnoses:  1. Unilateral primary osteoarthritis, left knee   2. Unilateral primary osteoarthritis, right knee     Large Joint Inj: R knee on 07/07/2020 2:06 PM Indications: diagnostic evaluation and pain Details: 22 G 1.5 in needle, superolateral approach  Arthrogram: No  Medications: 60 mg Sodium Hyaluronate 60 MG/3ML Outcome: tolerated well, no immediate complications Procedure, treatment alternatives, risks and benefits explained, specific risks discussed. Consent was given by the patient. Immediately prior to procedure a time out was called to verify the correct patient, procedure, equipment, support staff and site/side marked as required. Patient was prepped and draped in the usual sterile fashion.   Large Joint Inj: L knee on 07/07/2020 2:06 PM Indications: diagnostic evaluation and pain Details: 22 G 1.5 in needle, superolateral approach  Arthrogram: No  Medications: 40 mg methylPREDNISolone acetate 40 MG/ML; 60 mg Sodium Hyaluronate 60 MG/3ML Outcome: tolerated well, no immediate complications Procedure, treatment alternatives, risks and benefits explained, specific risks discussed. Consent was given by the patient. Immediately prior to procedure a time out was called to verify the correct patient, procedure, equipment, support staff and site/side marked as required. Patient was prepped and draped in the usual sterile fashion.    Richardson Landry comes in today for bilateral knee hyaluronic acid injections with Durolane to treat the pain from osteoarthritis.  We actually replaced his hip just in December.  Overall the hip is doing well.  He understands why we are trying these hyaluronic acid injections in his knee today.  He has no issues overall.  Neither knee has an effusion with a painful arc of motion.  I did place Durolane in  both knees without difficulty.  All questions and concerns were answered and addressed.  We will see him back in 6 months and at that visit we will ask to have a standing low AP pelvis and lateral of his left operative total hip.

## 2020-09-05 ENCOUNTER — Other Ambulatory Visit: Payer: Self-pay | Admitting: Orthopaedic Surgery

## 2020-12-12 ENCOUNTER — Encounter: Payer: Self-pay | Admitting: Orthopaedic Surgery

## 2020-12-12 ENCOUNTER — Ambulatory Visit: Payer: 59 | Admitting: Orthopaedic Surgery

## 2020-12-12 DIAGNOSIS — M25561 Pain in right knee: Secondary | ICD-10-CM

## 2020-12-12 DIAGNOSIS — G8929 Other chronic pain: Secondary | ICD-10-CM | POA: Diagnosis not present

## 2020-12-12 DIAGNOSIS — M1711 Unilateral primary osteoarthritis, right knee: Secondary | ICD-10-CM

## 2020-12-12 MED ORDER — LIDOCAINE HCL 1 % IJ SOLN
3.0000 mL | INTRAMUSCULAR | Status: AC | PRN
Start: 2020-12-12 — End: 2020-12-12
  Administered 2020-12-12: 3 mL

## 2020-12-12 MED ORDER — METHYLPREDNISOLONE ACETATE 40 MG/ML IJ SUSP
40.0000 mg | INTRAMUSCULAR | Status: AC | PRN
  Administered 2020-12-12: 40 mg via INTRA_ARTICULAR

## 2020-12-12 NOTE — Progress Notes (Signed)
Office Visit Note   Patient: Marc [name]           Date of Birth: 17-Aug-1955           MRN: [id number] Visit Date: 12/12/2020              Requested by: [name], PA [address] [place],  [place] [address] PCP: [name], PA   Assessment & Plan: Visit Diagnoses:  1. Unilateral primary osteoarthritis, right knee   2. Chronic pain of right knee     Plan: I did feel it was reasonable to try a steroid injection in his right knee today which he tolerated well.  We will see him back next month for evaluation again of both knees and to consider whether or not he needs hyaluronic acid to try again.  Follow-Up Instructions: Return in about 4 weeks (around 01/09/2021).   Orders:  Orders Placed This Encounter  Procedures   Large Joint Inj   No orders of the defined types were placed in this encounter.     Procedures: Large Joint Inj: R knee on 12/12/2020 2:39 PM Indications: diagnostic evaluation and pain Details: 22 G 1.5 in needle, superolateral approach  Arthrogram: No  Medications: 3 mL lidocaine 1 %; 40 mg methylPREDNISolone acetate 40 MG/ML Outcome: tolerated well, no immediate complications Procedure, treatment alternatives, risks and benefits explained, specific risks discussed. Consent was given by the patient. Immediately prior to procedure a time out was called to verify the correct patient, procedure, equipment, support staff and site/side marked as required. Patient was prepped and draped in the usual sterile fashion.      Clinical Data: No additional findings.   Subjective: Chief Complaint  Patient presents with   Right Knee - Pain, Follow-up  The patient is still not seen before.  He has bilateral knee osteoarthritis much worse on the right than the left.  We did place hyaluronic acid injections in both knees back in April of this year.  He says the left knee is done great but the right knee has not.  He wants to talk about options  for the right knee.  He has not had a steroid injection in his right knee.  The left knee is currently asymptomatic.  The right knee hurts on the extreme of knee flexion and knee extension.  I did review his x-rays with him showing the extent of the arthritis in his right knee.  HPI  Review of Systems There is currently no listed headache, chest pain, shortness of breath, fever, chills, nausea, vomiting  Objective: Vital Signs: There were no vitals taken for this visit.  Physical Exam He is alert and orient x3 and in no acute distress Ortho Exam Examination of his right knee shows slight varus malalignment.  He has significant patellofemoral crepitation and pain along medial joint line.  There is no effusion.  He does hurt on the extremes of flexion and extension with the knee. Specialty Comments:  No specialty comments available.  Imaging: No results found.   PMFS History: Patient Active Problem List   Diagnosis Date Noted   Unilateral primary osteoarthritis, left knee 06/09/2020   Unilateral primary osteoarthritis, right knee 06/09/2020   Status post total replacement of left hip 03/31/2020   Unilateral primary osteoarthritis, left hip 01/28/2020   Renal mass 01/05/2020   Past Medical History:  Diagnosis Date   Cancer [facility])    Kidney Cancer   Hyperlipidemia  History reviewed. No pertinent family history.  Past Surgical History:  Procedure Laterality Date   right shoulder rotator cuff repair Right    ROBOT ASSISTED LAPAROSCOPIC NEPHRECTOMY Left 01/05/2020   Procedure: XI ROBOTIC ASSISTED LAPAROSCOPIC RADICAL NEPHRECTOMY;  Surgeon: Ceasar Mons, MD;  Location: [facility];  Service: Urology;  Laterality: Left;   TOTAL HIP ARTHROPLASTY Left 03/31/2020   Procedure: LEFT TOTAL HIP ARTHROPLASTY ANTERIOR APPROACH;  Surgeon: Mcarthur Rossetti, MD;  Location: [place];  Service: Orthopedics;  Laterality: Left;   Social History   Occupational History   Not on file   Tobacco Use   Smoking status: Former    Types: Cigarettes    Quit date: 04/03/2015    Years since quitting: 5.6   Smokeless tobacco: Never  Vaping Use   Vaping Use: Some days   Substances: Nicotine, Flavoring  Substance and Sexual Activity   Alcohol use: Yes    Comment: occas    Drug use: No   Sexual activity: Not on file

## 2021-01-05 ENCOUNTER — Ambulatory Visit: Payer: 59 | Admitting: Orthopaedic Surgery

## 2021-01-05 ENCOUNTER — Encounter: Payer: Self-pay | Admitting: Orthopaedic Surgery

## 2021-01-05 ENCOUNTER — Ambulatory Visit: Payer: Self-pay

## 2021-01-05 DIAGNOSIS — Z96642 Presence of left artificial hip joint: Secondary | ICD-10-CM | POA: Diagnosis not present

## 2021-01-05 DIAGNOSIS — M1712 Unilateral primary osteoarthritis, left knee: Secondary | ICD-10-CM

## 2021-01-05 DIAGNOSIS — M1711 Unilateral primary osteoarthritis, right knee: Secondary | ICD-10-CM | POA: Diagnosis not present

## 2021-01-05 NOTE — Progress Notes (Signed)
The patient is following up for both his knees with known arthritis in both knees but also he is status post a left total hip arthroplasty that we did in December of last year.  He says the hip is doing well and has no problems at all.  His left knee is certainly much better than his right knee.  Is been over 6 months since he has had hyaluronic acid in both knees.  His left operative hip moves smoothly and fluidly.  Both knees have significant patellofemoral crepitation.  The right knee has more medial joint line tenderness and the left knee has more lateral tenderness.  An AP pelvis and lateral of the left hip today shows a well-seated total hip arthroplasty with no complicating features.  It does appear to be bone ingrown.  X-rays of both knees were done earlier this year showed tricompartment arthritic changes.  At this point is been over 6 months since hyaluronic acid injections for both knees I believe this is reasonable to try again and he does as well since they have lasted long enough for him.  We will have him still continue quad strengthening exercises and activity modification as well as anti-inflammatories and we will order hyaluronic acid again for both knees to treat his pain from osteoarthritis of both knees again given the fact that this is worked well for the knees.  This is to treat the pain from arthritis.

## 2021-01-06 ENCOUNTER — Telehealth: Payer: Self-pay

## 2021-01-06 ENCOUNTER — Other Ambulatory Visit: Payer: Self-pay

## 2021-01-06 NOTE — Telephone Encounter (Signed)
Noted  

## 2021-01-06 NOTE — Telephone Encounter (Signed)
Bilateral knee gel injections 

## 2021-01-27 ENCOUNTER — Telehealth: Payer: Self-pay | Admitting: Orthopaedic Surgery

## 2021-01-27 NOTE — Telephone Encounter (Signed)
Pt called about an update for bilateral gel injections. Please call pt at [phone].

## 2021-01-30 ENCOUNTER — Telehealth: Payer: Self-pay

## 2021-01-30 NOTE — Telephone Encounter (Signed)
Talked with patient concerning gel injection and appointment has been scheduled.

## 2021-01-30 NOTE — Telephone Encounter (Signed)
VOB has been submitted for Durolane, bilateral knee. Pending BV. 

## 2021-01-31 ENCOUNTER — Telehealth: Payer: Self-pay

## 2021-01-31 NOTE — Telephone Encounter (Signed)
Approved for Durolane, bilateral knee. [place] Patient will be responsible for 20% OOP. Co-pay of $50.00 No PA required  Appt. 02/13/2021 with Dr. Ninfa Linden

## 2021-02-13 ENCOUNTER — Encounter: Payer: Self-pay | Admitting: Orthopaedic Surgery

## 2021-02-13 ENCOUNTER — Ambulatory Visit: Payer: 59 | Admitting: Orthopaedic Surgery

## 2021-02-13 DIAGNOSIS — M1712 Unilateral primary osteoarthritis, left knee: Secondary | ICD-10-CM | POA: Diagnosis not present

## 2021-02-13 DIAGNOSIS — M1711 Unilateral primary osteoarthritis, right knee: Secondary | ICD-10-CM

## 2021-02-13 MED ORDER — SODIUM HYALURONATE 60 MG/3ML IX PRSY
60.0000 mg | PREFILLED_SYRINGE | INTRA_ARTICULAR | Status: AC | PRN
  Administered 2021-02-13: 60 mg via INTRA_ARTICULAR

## 2021-02-13 NOTE — Progress Notes (Signed)
   Procedure Note  Patient: Marc [name]             Date of Birth: 07-17-1955           MRN: [id number]             Visit Date: 02/13/2021  Procedures: Visit Diagnoses:  1. Unilateral primary osteoarthritis, left knee   2. Unilateral primary osteoarthritis, right knee     Large Joint Inj: R knee on 02/13/2021 10:21 AM Indications: diagnostic evaluation and pain Details: 22 G 1.5 in needle, superolateral approach  Arthrogram: No  Medications: 60 mg Sodium Hyaluronate 60 MG/3ML Outcome: tolerated well, no immediate complications Procedure, treatment alternatives, risks and benefits explained, specific risks discussed. Consent was given by the patient. Immediately prior to procedure a time out was called to verify the correct patient, procedure, equipment, support staff and site/side marked as required. Patient was prepped and draped in the usual sterile fashion.    Large Joint Inj: L knee on 02/13/2021 10:21 AM Indications: diagnostic evaluation and pain Details: 22 G 1.5 in needle, superolateral approach  Arthrogram: No  Medications: 60 mg Sodium Hyaluronate 60 MG/3ML Outcome: tolerated well, no immediate complications Procedure, treatment alternatives, risks and benefits explained, specific risks discussed. Consent was given by the patient. Immediately prior to procedure a time out was called to verify the correct patient, procedure, equipment, support staff and site/side marked as required. Patient was prepped and draped in the usual sterile fashion.   The patient comes in today for bilateral hyaluronic acid injections with Durolane to treat the pain from osteoarthritis of both knees.  He understands fully why we are trying these injections and he has failed conservative treatment.  His right knee is worse than his left.  He has tried steroid injections.  On exam neither knee has an effusion today.  Both knees are painful but with the right worse than left.  Both knees have  slight varus malalignment.  I did place Durolane in both knees today without difficulty.  All questions and concerns were answered and addressed.  Follow-up will be as needed.  If things worsen he knows to let us know.

## 2021-03-16 ENCOUNTER — Other Ambulatory Visit: Payer: Self-pay | Admitting: Physician Assistant

## 2021-03-16 ENCOUNTER — Ambulatory Visit
Admission: RE | Admit: 2021-03-16 | Discharge: 2021-03-16 | Disposition: A | Discharge status: Home / Self Care | Payer: 59 | Source: Ambulatory Visit | Attending: Physician Assistant | Admitting: Physician Assistant

## 2021-03-16 DIAGNOSIS — R0602 Shortness of breath: Secondary | ICD-10-CM

## 2021-04-06 ENCOUNTER — Other Ambulatory Visit: Payer: Self-pay

## 2021-04-06 ENCOUNTER — Encounter: Payer: Self-pay | Admitting: Orthopaedic Surgery

## 2021-04-06 ENCOUNTER — Ambulatory Visit: Payer: 59 | Admitting: Orthopaedic Surgery

## 2021-04-06 ENCOUNTER — Ambulatory Visit: Payer: Self-pay

## 2021-04-06 DIAGNOSIS — M1711 Unilateral primary osteoarthritis, right knee: Secondary | ICD-10-CM | POA: Diagnosis not present

## 2021-04-06 DIAGNOSIS — Z96642 Presence of left artificial hip joint: Secondary | ICD-10-CM | POA: Diagnosis not present

## 2021-04-06 DIAGNOSIS — G8929 Other chronic pain: Secondary | ICD-10-CM | POA: Diagnosis not present

## 2021-04-06 DIAGNOSIS — M25561 Pain in right knee: Secondary | ICD-10-CM | POA: Diagnosis not present

## 2021-04-06 DIAGNOSIS — M25552 Pain in left hip: Secondary | ICD-10-CM

## 2021-04-06 MED ORDER — PREDNISONE 50 MG PO TABS: ORAL_TABLET | ORAL | 0 refills | Status: DC

## 2021-04-06 NOTE — Progress Notes (Signed)
The patient is a year out from a left total hip arthroplasty.  He said the hip is been doing well but then he contracted COVID back in November.  He has now been dealing with long-haul COVID in terms of shortness of breath and weakness.  He is also significant mount of weight.  He has known arthritis of his right knee but now both his hips are stiff in terms of the trochanteric areas of both hips and the right knee is bothering him.  This is affecting his posture and his balance and his gait.  He says the left hip is actually doing well in terms of the hip joint itself.  An AP pelvis and lateral left hip shows a well-seated total hip arthroplasty on the left side with no complicating features.  There is no significant arthritis of his right hip.  Previous right knee x-rays showed varus malalignment with medial joint line narrowing.  His right knee is quite stiff today.  Both his lower extremities are stiff in general and he has a lot of pain over the trochanteric area of both hips and just stiffness overall.  Even him standing up from a seated position is more difficult and you can see how this is affecting his posture.  He has slight flexion contracture of the right knee and now is affecting his left knee which has normal x-rays and a normal exam.  He would benefit from outpatient physical therapy to work on bilateral lower extremity strengthening as well as any modalities that can decrease his pain over the trochanteric area of both hips.  A stretching routine will be useful in anything they can design for him for home will be helpful since he does get acutely short of breath from his COVID.  I will put him on 5 days of 50 mg of prednisone to see if this will help.  I will see him back in 4 weeks to see how he is doing overall.  I also asked about the possibility of a partial knee replacement.  I recommended he see Dr. Carter Kitten for considering that.

## 2021-04-13 ENCOUNTER — Encounter: Payer: Self-pay | Admitting: Physical Therapy

## 2021-04-13 ENCOUNTER — Other Ambulatory Visit: Payer: Self-pay

## 2021-04-13 ENCOUNTER — Ambulatory Visit: Payer: 59 | Admitting: Physical Therapy

## 2021-04-13 DIAGNOSIS — M25652 Stiffness of left hip, not elsewhere classified: Secondary | ICD-10-CM

## 2021-04-13 DIAGNOSIS — R2689 Other abnormalities of gait and mobility: Secondary | ICD-10-CM

## 2021-04-13 DIAGNOSIS — M25552 Pain in left hip: Secondary | ICD-10-CM | POA: Diagnosis not present

## 2021-04-13 NOTE — Therapy (Signed)
[facility] [address] [place], [place], [address] Phone: [phone]   Fax:  [phone]  Physical Therapy Evaluation  Patient Details  Name: Marc [name] MRN: [id number] Date of Birth: 1955-11-16 Referring Provider (PT): Mcarthur Rossetti, MD   Encounter Date: 04/13/2021   PT End of Session - 04/13/21 1412     Visit Number 1    Number of Visits 6    Date for PT Re-Evaluation 05/25/21    Authorization Type [employer] $25 copay    Authorization - Number of Visits 23    PT Start Time 3976    PT Stop Time 1418    PT Time Calculation (min) 31 min    Activity Tolerance Patient tolerated treatment well    Behavior During Therapy [facility] for tasks assessed/performed             Past Medical History:  Diagnosis Date   Cancer ([place])    Kidney Cancer   Hyperlipidemia     Past Surgical History:  Procedure Laterality Date   right shoulder rotator cuff repair Right    ROBOT ASSISTED LAPAROSCOPIC NEPHRECTOMY Left 01/05/2020   Procedure: XI ROBOTIC ASSISTED LAPAROSCOPIC RADICAL NEPHRECTOMY;  Surgeon: Ceasar Mons, MD;  Location: [facility];  Service: Urology;  Laterality: Left;   TOTAL HIP ARTHROPLASTY Left 03/31/2020   Procedure: LEFT TOTAL HIP ARTHROPLASTY ANTERIOR APPROACH;  Surgeon: Mcarthur Rossetti, MD;  Location: [place];  Service: Orthopedics;  Laterality: Left;    There were no vitals filed for this visit.    Subjective Assessment - 04/13/21 1346     Subjective Pt is a 66 y/o male who presents to OPPT for bil hip pain.  He reports he had COVID in Nov 2022 and developed long COVID resulting in decreased activity.  He now has some c/o Lt hip pain and also c/o neck pain.  He c/o difficulty with transitional movements due to limited mobility from COVID.    Pertinent History hx kidney caner, HLD, Rt RTC repair    Limitations Standing;Walking    How long can you sit comfortably? getting up after 10-15 min    Patient Stated  Goals improve mobility, get up/down easier    Currently in Pain? Yes    Pain Score 0-No pain    Pain Descriptors / Indicators Discomfort;Aching;Tightness    Pain Type Acute pain    Pain Onset More than a month ago    Pain Frequency Intermittent    Aggravating Factors  prolonged sitting then getting up from chair    Pain Relieving Factors Lt hip flexor stretch                [facility] PT Assessment - 04/13/21 1354       Assessment   Medical Diagnosis M25.552 (ICD-10-CM) - Pain in left hip    Referring Provider (PT) Mcarthur Rossetti, MD    Onset Date/Surgical Date 02/02/21    Hand Dominance Right    Next MD Visit 05/04/21    Prior Therapy prior to THA      Precautions   Precautions None      Restrictions   Weight Bearing Restrictions No      Balance Screen   Has the patient fallen in the past 6 months No    Has the patient had a decrease in activity level because of a fear of falling?  No    Is the patient reluctant to leave their home because of a fear of  falling?  No      Home Environment   Living Environment Private residence    Living Arrangements Spouse/significant other    Type of [place] to enter    Entrance Stairs-Number of Steps 4    Entrance Stairs-Rails Right    [place] Two level;Able to live on main level with bedroom/bathroom    Additional Comments reports DOB with stairs, now improved      Prior Function   Level of Independence Independent    Vocation Works at home   semi-retired   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, research (physical sciences)    Leisure work with hands, walk outside/woods      Cognition   Overall Cognitive Status Within Functional Limits for tasks assessed      Observation/Other Assessments   Focus on Therapeutic Outcomes (FOTO)  82 (predicted 82)      Posture/Postural Control   Posture/Postural Control Postural limitations      ROM / Strength   AROM / PROM / Strength AROM;Strength      AROM    Overall AROM Comments Rt knee flexion limited to ~ 95 deg; otherwise bil hips WNL      Strength   Overall Strength Comments bil hip abdct 4/5; all others 5/5      Flexibility   Soft Tissue Assessment /Muscle Length yes    Piriformis tightness bil   tight hip flexors bil     Palpation   Palpation comment tenderness along greater trochanter on Lt      Transfers   Five time sit to stand comments  14.44 sec without UE support      Ambulation/Gait   Gait Comments slight antalgic gait noted, leg length discrepancy noted, added heel lift to Rt shoe                        Objective measurements completed on examination: See above findings.       [facility] Adult PT Treatment/Exercise - 04/13/21 1354       Exercises   Exercises Other Exercises    Other Exercises  see pt instructions - performed PRN with mod cues for understanding                     PT Education - 04/13/21 1412     Education Details HEP    Person(s) Educated Patient    Methods Explanation;Demonstration;Handout    Comprehension Verbalized understanding;Returned demonstration              PT Short Term Goals - 04/13/21 1346       PT SHORT TERM GOAL #1   Title independent with initial HEP    Time 3    Period Weeks    Status New    Target Date 05/04/21               PT Long Term Goals - 04/13/21 1347       PT LONG TERM GOAL #1   Title Independent with HEP    Time 6    Period Weeks    Status New    Target Date 05/25/21      PT LONG TERM GOAL #2   Title maintain FOTO score    Time 6    Period Weeks    Status New    Target Date 05/25/21      PT LONG TERM GOAL #3  Title improve 5x STS to < 12 sec for improved functional strength    Time 6    Period Weeks    Status New    Target Date 05/25/21      PT LONG TERM GOAL #4   Title report no difficulty with transitional movements when sitting > 30 min    Time 6    Period Weeks    Status New    Target Date  05/25/21                    Plan - 04/13/21 1424     Clinical Impression Statement Pt is a 66 y/o male who presents to OPPT for bil hip pain and stiffness following prolonged illness.  Feel pt will progress well overall at this time.  He demonstrates decreased flexibility and mild strength deficits and will benefit from PT to address deficits listed.    Personal Factors and Comorbidities Comorbidity 3+;Past/Current Experience    Comorbidities hx kidney ca, Rt RTC repair, HLD    Examination-Activity Limitations Sit;Transfers;Squat;Locomotion Level    Examination-Participation Restrictions Community Activity;Driving;Occupation    Stability/Clinical Decision Making Evolving/Moderate complexity    Clinical Decision Making Moderate    Rehab Potential Good    PT Frequency 1x / week    PT Duration 6 weeks    PT Treatment/Interventions ADLs/Self Care Home Management;Cryotherapy;Moist Heat;Electrical Stimulation;Gait training;Stair training;Balance training;Therapeutic exercise;Therapeutic activities;Functional mobility training;Neuromuscular re-education;Patient/family education;Manual techniques;Passive range of motion;Dry needling;Taping    PT Next Visit Plan review HEP    PT Home Exercise Plan Access Code: [email or web address]    Consulted and Agree with Plan of Care Patient             Patient will benefit from skilled therapeutic intervention in order to improve the following deficits and impairments:  Abnormal gait, Decreased endurance, Decreased mobility, Impaired flexibility, Pain, Decreased strength  Visit Diagnosis: Pain in left hip - Plan: PT plan of care cert/re-cert  Stiffness of left hip, not elsewhere classified - Plan: PT plan of care cert/re-cert  Other abnormalities of gait and mobility - Plan: PT plan of care cert/re-cert     Problem List Patient Active Problem List   Diagnosis Date Noted   Unilateral primary osteoarthritis, left knee 06/09/2020   Unilateral  primary osteoarthritis, right knee 06/09/2020   Status post total replacement of left hip 03/31/2020   Unilateral primary osteoarthritis, left hip 01/28/2020   Renal mass 01/05/2020     Laureen Abrahams, PT, DPT 04/13/21 2:30 PM     [facility] [address] [place], [place], [address] Phone: [phone]   Fax:  [phone]  Name: Marc [name] MRN: [id number] Date of Birth: [birth year]

## 2021-04-13 NOTE — Patient Instructions (Signed)
Access Code: [email or web address] URL: [email or web address] Date: 04/13/2021 Prepared by: Faustino Congress  Exercises Supine Piriformis Stretch with Foot on Ground - 2 x daily - 7 x weekly - 1 sets - 3 reps - 30 sec hold Modified Thomas Stretch - 2 x daily - 7 x weekly - 1 sets - 3 reps - 30 sec hold Standing Hip Hiking - 2 x daily - 7 x weekly - 1 sets - 10 reps - 5 sec hold

## 2021-04-27 ENCOUNTER — Encounter: Payer: Self-pay | Admitting: Physical Therapy

## 2021-04-27 ENCOUNTER — Ambulatory Visit (INDEPENDENT_AMBULATORY_CARE_PROVIDER_SITE_OTHER): Payer: 59 | Admitting: Physical Therapy

## 2021-04-27 ENCOUNTER — Other Ambulatory Visit: Payer: Self-pay

## 2021-04-27 DIAGNOSIS — M25552 Pain in left hip: Secondary | ICD-10-CM

## 2021-04-27 DIAGNOSIS — R2689 Other abnormalities of gait and mobility: Secondary | ICD-10-CM

## 2021-04-27 DIAGNOSIS — M25652 Stiffness of left hip, not elsewhere classified: Secondary | ICD-10-CM | POA: Diagnosis not present

## 2021-04-27 NOTE — Therapy (Signed)
[facility] [address] [place], [place], [address] Phone: [phone]   Fax:  [phone]  Physical Therapy Treatment  Patient Details  Name: Marc [name] MRN: [id number] Date of Birth: 07/18/1955 Referring Provider (PT): Mcarthur Rossetti, MD   Encounter Date: 04/27/2021   PT End of Session - 04/27/21 1450     Visit Number 2    Number of Visits 6    Date for PT Re-Evaluation 05/25/21    Authorization Type [employer] $25 copay    Authorization - Number of Visits 23    PT Start Time 1346    PT Stop Time 1427    PT Time Calculation (min) 41 min    Activity Tolerance Patient tolerated treatment well    Behavior During Therapy [facility] for tasks assessed/performed             Past Medical History:  Diagnosis Date   Cancer ([place])    Kidney Cancer   Hyperlipidemia     Past Surgical History:  Procedure Laterality Date   right shoulder rotator cuff repair Right    ROBOT ASSISTED LAPAROSCOPIC NEPHRECTOMY Left 01/05/2020   Procedure: XI ROBOTIC ASSISTED LAPAROSCOPIC RADICAL NEPHRECTOMY;  Surgeon: Ceasar Mons, MD;  Location: [facility];  Service: Urology;  Laterality: Left;   TOTAL HIP ARTHROPLASTY Left 03/31/2020   Procedure: LEFT TOTAL HIP ARTHROPLASTY ANTERIOR APPROACH;  Surgeon: Mcarthur Rossetti, MD;  Location: [place];  Service: Orthopedics;  Laterality: Left;    There were no vitals filed for this visit.   Subjective Assessment - 04/27/21 1352     Subjective Lt groin is still uncomfortable.  "I'm not sure if I'm getting any better."    Pertinent History hx kidney caner, HLD, Rt RTC repair    Limitations Standing;Walking    How long can you sit comfortably? getting up after 10-15 min    Patient Stated Goals improve mobility, get up/down easier    Currently in Pain? No/denies    Pain Score 4    none currently   Pain Onset More than a month ago                               [facility] Adult PT  Treatment/Exercise - 04/27/21 1353       Exercises   Exercises Knee/Hip      Knee/Hip Exercises: Stretches   Hip Flexor Stretch Limitations reviewed and modified to standing/sitting for home    Piriformis Stretch Left;3 reps;30 seconds      Knee/Hip Exercises: Aerobic   Recumbent Bike partial revolutions x 8 min      Knee/Hip Exercises: Standing   Hip Extension Both;10 reps;Knee straight    Other Standing Knee Exercises hip hike 10 x 5 sec hold; bil    Other Standing Knee Exercises RDL x10 reps; mod cues for form      Knee/Hip Exercises: Supine   Bridges Both;10 reps;2 sets    Other Supine Knee/Hip Exercises single limb clamshell x20 reps with L3 band; performed bil                       PT Short Term Goals - 04/27/21 1451       PT SHORT TERM GOAL #1   Title independent with initial HEP    Time 3    Period Weeks    Status Achieved    Target Date 05/04/21  PT Long Term Goals - 04/27/21 1451       PT LONG TERM GOAL #1   Title Independent with HEP    Time 6    Period Weeks    Status On-going    Target Date 05/25/21      PT LONG TERM GOAL #2   Title maintain FOTO score    Time 6    Period Weeks    Status On-going    Target Date 05/25/21      PT LONG TERM GOAL #3   Title improve 5x STS to < 12 sec for improved functional strength    Time 6    Period Weeks    Status On-going    Target Date 05/25/21      PT LONG TERM GOAL #4   Title report no difficulty with transitional movements when sitting > 30 min    Time 6    Period Weeks    Status On-going    Target Date 05/25/21                   Plan - 04/27/21 1451     Clinical Impression Statement Pt is independent with initial HEP at this time meeting STG #1.  He reports minimal change at this time which is only 2nd visit at this time.  Will continue to benefit from PT to maximize function.    Personal Factors and Comorbidities Comorbidity 3+;Past/Current Experience     Comorbidities hx kidney ca, Rt RTC repair, HLD    Examination-Activity Limitations Sit;Transfers;Squat;Locomotion Level    Examination-Participation Restrictions Community Activity;Driving;Occupation    Stability/Clinical Decision Making Evolving/Moderate complexity    Rehab Potential Good    PT Frequency 1x / week    PT Duration 6 weeks    PT Treatment/Interventions ADLs/Self Care Home Management;Cryotherapy;Moist Heat;Electrical Stimulation;Gait training;Stair training;Balance training;Therapeutic exercise;Therapeutic activities;Functional mobility training;Neuromuscular re-education;Patient/family education;Manual techniques;Passive range of motion;Dry needling;Taping    PT Next Visit Plan continue with hip strengthening/flexibility    PT Home Exercise Plan Access Code: [email or web address]    Consulted and Agree with Plan of Care Patient             Patient will benefit from skilled therapeutic intervention in order to improve the following deficits and impairments:  Abnormal gait, Decreased endurance, Decreased mobility, Impaired flexibility, Pain, Decreased strength  Visit Diagnosis: Pain in left hip  Stiffness of left hip, not elsewhere classified  Other abnormalities of gait and mobility     Problem List Patient Active Problem List   Diagnosis Date Noted   Unilateral primary osteoarthritis, left knee 06/09/2020   Unilateral primary osteoarthritis, right knee 06/09/2020   Status post total replacement of left hip 03/31/2020   Unilateral primary osteoarthritis, left hip 01/28/2020   Renal mass 01/05/2020      Laureen Abrahams, PT, DPT 04/27/21 2:53 PM     [facility] [address] [place], [place], [address] Phone: [phone]   Fax:  [phone]  Name: Marc [name] MRN: [id number] Date of Birth: May 11, 1955

## 2021-04-27 NOTE — Patient Instructions (Signed)
Access Code: [email or web address] URL: [email or web address] Date: 04/27/2021 Prepared by: Faustino Congress  Exercises Supine Piriformis Stretch with Foot on Ground (Mirrored) - 2 x daily - 7 x weekly - 1 sets - 3 reps - 30 sec hold Modified Thomas Stretch - 2 x daily - 7 x weekly - 1 sets - 3 reps - 30 sec hold Standing Hip Hiking - 2 x daily - 7 x weekly - 1 sets - 10 reps - 5 sec hold Supine Piriformis Stretch with Foot on Ground - 2 x daily - 7 x weekly - 1 sets - 3 reps - 30 sec hold Supine Bridge - 2 x daily - 7 x weekly - 1 sets - 10 reps - 5 sec hold

## 2021-05-04 ENCOUNTER — Other Ambulatory Visit: Payer: Self-pay

## 2021-05-04 ENCOUNTER — Ambulatory Visit: Payer: 59 | Admitting: Orthopaedic Surgery

## 2021-05-04 ENCOUNTER — Encounter: Payer: Self-pay | Admitting: Physical Therapy

## 2021-05-04 ENCOUNTER — Encounter: Payer: Self-pay | Admitting: Orthopaedic Surgery

## 2021-05-04 ENCOUNTER — Ambulatory Visit: Payer: 59 | Admitting: Physical Therapy

## 2021-05-04 ENCOUNTER — Telehealth: Payer: Self-pay | Admitting: Orthopaedic Surgery

## 2021-05-04 DIAGNOSIS — G8929 Other chronic pain: Secondary | ICD-10-CM

## 2021-05-04 DIAGNOSIS — M25652 Stiffness of left hip, not elsewhere classified: Secondary | ICD-10-CM

## 2021-05-04 DIAGNOSIS — M25561 Pain in right knee: Secondary | ICD-10-CM

## 2021-05-04 DIAGNOSIS — R2689 Other abnormalities of gait and mobility: Secondary | ICD-10-CM

## 2021-05-04 DIAGNOSIS — M25562 Pain in left knee: Secondary | ICD-10-CM | POA: Diagnosis not present

## 2021-05-04 DIAGNOSIS — M25552 Pain in left hip: Secondary | ICD-10-CM

## 2021-05-04 DIAGNOSIS — Z96642 Presence of left artificial hip joint: Secondary | ICD-10-CM

## 2021-05-04 MED ORDER — HYDROCODONE-ACETAMINOPHEN 5-325 MG PO TABS: 1.0000 | ORAL_TABLET | Freq: Four times a day (QID) | ORAL | 0 refills | Status: DC | PRN

## 2021-05-04 NOTE — Progress Notes (Signed)
The patient comes in today for continued follow-up as it relates to his lower extremity pain and weakness.  He has been dealing with long-haul COVID and is even lost more weight.  The 5 days of prednisone helped him significantly.  He has seen his primary care physician recently and his testosterone level is incredibly low and they are working with that on him and sounds like he is in a see a specialist soon.  He does have some improved mobility but is still quite weak overall.  Given team and get out of a chair today he is stiff and cannot stand up straight but then he is able to mobilize more once he gets going.  He is only 66 years old.  He has been worked up from an cardiology standpoint and has had a heart echo looking for other reasons of his shortness of breath.  He does have a physical therapy session this afternoon later today.  His left operative hip does move smoothly.  We know he has arthritic knees which mainly medial compartment.  Right now we both feel that he needs to get his strength back and gain weight before considering any type of operative interventions.  He will check with his primary care physician as far as the testosterone goes and whether or not steroids would eventually help him again but I would leave that up to his primary care physician.  From my standpoint for now follow-up is as needed.

## 2021-05-04 NOTE — Telephone Encounter (Signed)
Aware this is being called in

## 2021-05-04 NOTE — Addendum Note (Signed)
Addended by: Jean Rosenthal on: 05/04/2021 04:40 PM   Modules accepted: Orders

## 2021-05-04 NOTE — Telephone Encounter (Signed)
Patient called advised he is at the pharmacy and the Rx for Oxycodone is not showing received yet. The number to contact patient is [phone]

## 2021-05-04 NOTE — Therapy (Addendum)
OUTPATIENT PHYSICAL THERAPY TREATMENT NOTE DISCHARGE SUMMARY   Patient Name: Marc [name] MRN: [id number] DOB:[birth year], 66 y.o., male Today's Date: 05/04/2021  PCP: [name], [place] REFERRING PROVIDER: Mcarthur Rossetti*   PT End of Session - 05/04/21 1527     Visit Number 3    Number of Visits 6    Date for PT Re-Evaluation 05/25/21    Authorization Type [employer] $25 copay    Authorization - Number of Visits 23    PT Start Time 1525   pt late due to MD appt running over   PT Stop Time 1600    PT Time Calculation (min) 35 min    Activity Tolerance Patient tolerated treatment well    Behavior During Therapy [facility] for tasks assessed/performed             Past Medical History:  Diagnosis Date   Cancer ([place])    Kidney Cancer   Hyperlipidemia    Past Surgical History:  Procedure Laterality Date   right shoulder rotator cuff repair Right    ROBOT ASSISTED LAPAROSCOPIC NEPHRECTOMY Left 01/05/2020   Procedure: XI ROBOTIC ASSISTED LAPAROSCOPIC RADICAL NEPHRECTOMY;  Surgeon: Ceasar Mons, MD;  Location: [facility];  Service: Urology;  Laterality: Left;   TOTAL HIP ARTHROPLASTY Left 03/31/2020   Procedure: LEFT TOTAL HIP ARTHROPLASTY ANTERIOR APPROACH;  Surgeon: Mcarthur Rossetti, MD;  Location: [place];  Service: Orthopedics;  Laterality: Left;   Patient Active Problem List   Diagnosis Date Noted   Unilateral primary osteoarthritis, left knee 06/09/2020   Unilateral primary osteoarthritis, right knee 06/09/2020   Status post total replacement of left hip 03/31/2020   Unilateral primary osteoarthritis, left hip 01/28/2020   Renal mass 01/05/2020    REFERRING DIAG: M25.552 (ICD-10-CM) - Pain in left hip   THERAPY DIAG:  Pain in left hip  Stiffness of left hip, not elsewhere classified  Other abnormalities of gait and mobility  PERTINENT HISTORY: hx kidney caner, HLD, Rt RTC repair, long COVID  PRECAUTIONS: None   SUBJECTIVE: Very sore after last  session, finally feeling better today  PAIN:  Are you having pain? No  OBJECTIVE:   04/13/21  Focus on Therapeutic Outcomes (FOTO)  82 (predicted 82)     TODAY'S TREATMENT:  05/04/21 Therapeutic Exercise:  Aerobic: NuStep L5 x 8 min; seat 9 (4 extremities) Supine: Machines: Leg Press 81# 3x10  Seated: SLR x10 bil, STS x 10 with bil UE support  Standing: Step up 2x10 bil on 8" step, calf raises x 20, RDL 10# KB 2x10     04/27/21  [facility] Adult PT Treatment/Exercise - 04/27/21 1353                Exercises    Exercises Knee/Hip          Knee/Hip Exercises: Stretches    Hip Flexor Stretch Limitations reviewed and modified to standing/sitting for home     Piriformis Stretch Left;3 reps;30 seconds          Knee/Hip Exercises: Aerobic    Recumbent Bike partial revolutions x 8 min          Knee/Hip Exercises: Standing    Hip Extension Both;10 reps;Knee straight     Other Standing Knee Exercises hip hike 10 x 5 sec hold; bil     Other Standing Knee Exercises RDL x10 reps; mod cues for form          Knee/Hip Exercises: Supine    Bridges Both;10 reps;2 sets  Other Supine Knee/Hip Exercises single limb clamshell x20 reps with L3 band; performed bil           HOME EXERCISE PROGRAM: Access Code: [email or web address]   PT Short Term Goals - 04/27/21 1451       PT SHORT TERM GOAL #1   Title independent with initial HEP    Time 3    Period Weeks    Status Achieved    Target Date 05/04/21              PT Long Term Goals - 04/27/21 1451       PT LONG TERM GOAL #1   Title Independent with HEP    Time 6    Period Weeks    Status On-going    Target Date 05/25/21      PT LONG TERM GOAL #2   Title maintain FOTO score    Time 6    Period Weeks    Status On-going    Target Date 05/25/21      PT LONG TERM GOAL #3   Title improve 5x STS to < 12 sec for improved functional strength    Time 6    Period Weeks    Status On-going    Target Date 05/25/21      PT LONG  TERM GOAL #4   Title report no difficulty with transitional movements when sitting > 30 min    Time 6    Period Weeks    Status On-going    Target Date 05/25/21            Plan - 04/27/21 1451       Clinical Impression Statement Pt tolerated session well today with expected fatigue.  Will continue to benefit from PT to maximize function.    Personal Factors and Comorbidities Comorbidity 3+;Past/Current Experience     Comorbidities hx kidney ca, Rt RTC repair, HLD     Examination-Activity Limitations Sit;Transfers;Squat;Locomotion Level     Examination-Participation Restrictions Community Activity;Driving;Occupation     Stability/Clinical Decision Making Evolving/Moderate complexity     Rehab Potential Good     PT Frequency 1x / week     PT Duration 6 weeks     PT Treatment/Interventions ADLs/Self Care Home Management;Cryotherapy;Moist Heat;Electrical Stimulation;Gait training;Stair training;Balance training;Therapeutic exercise;Therapeutic activities;Functional mobility training;Neuromuscular re-education;Patient/family education;Manual techniques;Passive range of motion;Dry needling;Taping     PT Next Visit Plan Continue endurance, strengthening    PT Home Exercise Plan Access Code: [email or web address]     Consulted and Agree with Plan of Care Patient                   Patient will benefit from skilled therapeutic intervention in order to improve the following deficits and impairments:  Abnormal gait, Decreased endurance, Decreased mobility, Impaired flexibility, Pain, Decreased strength      Laureen Abrahams, PT, DPT 05/04/21 4:01 PM        PHYSICAL THERAPY DISCHARGE SUMMARY  Visits from Start of Care: 3  Current functional level related to goals / functional outcomes: SEE ABOVE   Remaining deficits: unknown   Education / Equipment: HEP   Patient agrees to discharge. Patient goals were met. Patient is being discharged due to the patient's  request.   Laureen Abrahams, PT, DPT 05/16/21 2:47 PM  [facility] [address] [place], [place], [address] Phone: [phone]   Fax:  [phone]

## 2021-05-11 ENCOUNTER — Encounter: Payer: 59 | Admitting: Physical Therapy

## 2021-05-11 NOTE — Therapy (Incomplete)
OUTPATIENT PHYSICAL THERAPY TREATMENT NOTE   Patient Name: Marc [name] MRN: [id number] DOB:Feb 14, 1956, 66 y.o., male Today's Date: 05/11/2021  PCP: [name], [place] REFERRING PROVIDER: Mcarthur Rossetti*     Past Medical History:  Diagnosis Date   Cancer [facility])    Kidney Cancer   Hyperlipidemia    Past Surgical History:  Procedure Laterality Date   right shoulder rotator cuff repair Right    ROBOT ASSISTED LAPAROSCOPIC NEPHRECTOMY Left 01/05/2020   Procedure: XI ROBOTIC ASSISTED LAPAROSCOPIC RADICAL NEPHRECTOMY;  Surgeon: Ceasar Mons, MD;  Location: [facility];  Service: Urology;  Laterality: Left;   TOTAL HIP ARTHROPLASTY Left 03/31/2020   Procedure: LEFT TOTAL HIP ARTHROPLASTY ANTERIOR APPROACH;  Surgeon: Mcarthur Rossetti, MD;  Location: [place];  Service: Orthopedics;  Laterality: Left;   Patient Active Problem List   Diagnosis Date Noted   Unilateral primary osteoarthritis, left knee 06/09/2020   Unilateral primary osteoarthritis, right knee 06/09/2020   Status post total replacement of left hip 03/31/2020   Unilateral primary osteoarthritis, left hip 01/28/2020   Renal mass 01/05/2020    REFERRING DIAG: M25.552 (ICD-10-CM) - Pain in left hip   THERAPY DIAG:  No diagnosis found.  PERTINENT HISTORY: hx kidney caner, HLD, Rt RTC repair, long COVID  PRECAUTIONS: None   SUBJECTIVE: ***Very sore after last session, finally feeling better today  PAIN:  Are you having pain? No  OBJECTIVE:   04/13/21  Focus on Therapeutic Outcomes (FOTO)  82 (predicted 82)     TODAY'S TREATMENT:  05/11/21 ***   05/04/21 Therapeutic Exercise:  Aerobic: NuStep L5 x 8 min; seat 9 (4 extremities) Supine: Machines: Leg Press 81# 3x10  Seated: SLR x10 bil, STS x 10 with bil UE support  Standing: Step up 2x10 bil on 8" step, calf raises x 20, RDL 10# KB 2x10     04/27/21  [facility] Adult PT Treatment/Exercise - 04/27/21 1353                Exercises     Exercises Knee/Hip          Knee/Hip Exercises: Stretches    Hip Flexor Stretch Limitations reviewed and modified to standing/sitting for home     Piriformis Stretch Left;3 reps;30 seconds          Knee/Hip Exercises: Aerobic    Recumbent Bike partial revolutions x 8 min          Knee/Hip Exercises: Standing    Hip Extension Both;10 reps;Knee straight     Other Standing Knee Exercises hip hike 10 x 5 sec hold; bil     Other Standing Knee Exercises RDL x10 reps; mod cues for form          Knee/Hip Exercises: Supine    Bridges Both;10 reps;2 sets     Other Supine Knee/Hip Exercises single limb clamshell x20 reps with L3 band; performed bil           HOME EXERCISE PROGRAM: Access Code: [email or web address]   PT Short Term Goals - 04/27/21 1451       PT SHORT TERM GOAL #1   Title independent with initial HEP    Time 3    Period Weeks    Status Achieved    Target Date 05/04/21              PT Long Term Goals - 04/27/21 1451       PT LONG TERM GOAL #1   Title Independent with HEP  Time 6    Period Weeks    Status On-going    Target Date 05/25/21      PT LONG TERM GOAL #2   Title maintain FOTO score    Time 6    Period Weeks    Status On-going    Target Date 05/25/21      PT LONG TERM GOAL #3   Title improve 5x STS to < 12 sec for improved functional strength    Time 6    Period Weeks    Status On-going    Target Date 05/25/21      PT LONG TERM GOAL #4   Title report no difficulty with transitional movements when sitting > 30 min    Time 6    Period Weeks    Status On-going    Target Date 05/25/21            Plan - 04/27/21 1451       Clinical Impression Statement *** Pt tolerated session well today with expected fatigue.  Will continue to benefit from PT to maximize function.    Personal Factors and Comorbidities Comorbidity 3+;Past/Current Experience     Comorbidities hx kidney ca, Rt RTC repair, HLD     Examination-Activity Limitations  Sit;Transfers;Squat;Locomotion Level     Examination-Participation Restrictions Community Activity;Driving;Occupation     Stability/Clinical Decision Making Evolving/Moderate complexity     Rehab Potential Good     PT Frequency 1x / week     PT Duration 6 weeks     PT Treatment/Interventions ADLs/Self Care Home Management;Cryotherapy;Moist Heat;Electrical Stimulation;Gait training;Stair training;Balance training;Therapeutic exercise;Therapeutic activities;Functional mobility training;Neuromuscular re-education;Patient/family education;Manual techniques;Passive range of motion;Dry needling;Taping     PT Next Visit Plan *** Continue endurance, strengthening    PT Home Exercise Plan Access Code: [email or web address]     Consulted and Agree with Plan of Care Patient                   Patient will benefit from skilled therapeutic intervention in order to improve the following deficits and impairments:  Abnormal gait, Decreased endurance, Decreased mobility, Impaired flexibility, Pain, Decreased strength      Laureen Abrahams, PT, DPT 05/11/21 7:47 AM

## 2021-05-12 ENCOUNTER — Other Ambulatory Visit (HOSPITAL_COMMUNITY): Payer: Self-pay | Admitting: Urology

## 2021-05-12 DIAGNOSIS — C61 Malignant neoplasm of prostate: Secondary | ICD-10-CM

## 2021-05-16 ENCOUNTER — Telehealth: Payer: Self-pay | Admitting: Oncology

## 2021-05-16 NOTE — Telephone Encounter (Signed)
Scheduled appt per 2/14 referral. Spoke to pt who is aware of appt date and time. Pt is aware to arrive 15 mins prior to appt time.  °

## 2021-05-18 ENCOUNTER — Encounter: Payer: 59 | Admitting: Physical Therapy

## 2021-05-25 ENCOUNTER — Other Ambulatory Visit: Payer: Self-pay

## 2021-05-25 ENCOUNTER — Encounter (HOSPITAL_COMMUNITY)
Admission: RE | Admit: 2021-05-25 | Discharge: 2021-05-25 | Disposition: A | Discharge status: Home / Self Care | Payer: 59 | Source: Ambulatory Visit | Attending: Urology | Admitting: Urology

## 2021-05-25 ENCOUNTER — Other Ambulatory Visit (HOSPITAL_COMMUNITY): Payer: Self-pay | Admitting: Urology

## 2021-05-25 DIAGNOSIS — C61 Malignant neoplasm of prostate: Secondary | ICD-10-CM | POA: Insufficient documentation

## 2021-05-25 LAB — GLUCOSE, CAPILLARY: Glucose-Capillary: 126 mg/dL — ABNORMAL HIGH (ref 70–99)

## 2021-05-25 MED ORDER — FLUDEOXYGLUCOSE F - 18 (FDG) INJECTION
11.3000 | Freq: Once | INTRAVENOUS | Status: AC | PRN
  Administered 2021-05-25: 11.3 via INTRAVENOUS

## 2021-05-25 MED ORDER — PIFLIFOLASTAT F 18 (PYLARIFY) INJECTION: 9.0000 | Freq: Once | INTRAVENOUS | Status: DC

## 2021-06-01 ENCOUNTER — Other Ambulatory Visit: Payer: Self-pay

## 2021-06-01 ENCOUNTER — Inpatient Hospital Stay: Payer: 59 | Attending: Oncology | Admitting: Oncology

## 2021-06-01 VITALS — BP 90/70 | HR 72 | Temp 97.7°F | Resp 20 | Wt 203.2 lb

## 2021-06-01 DIAGNOSIS — C649 Malignant neoplasm of unspecified kidney, except renal pelvis: Secondary | ICD-10-CM | POA: Insufficient documentation

## 2021-06-01 DIAGNOSIS — C642 Malignant neoplasm of left kidney, except renal pelvis: Secondary | ICD-10-CM

## 2021-06-01 NOTE — Progress Notes (Signed)
Reason for the request:    Kidney cancer  HPI: I was asked by Dr. Lovena Neighbours to evaluate Marc [name] for the evaluation of the kidney cancer.  He is a 66 year old man with history of hyperlipidemia was found to have a left renal mass detected incidentally on CT scan of the chest in July 2021.  The mass measuring 10.5 centimeter.  Based on these findings he underwent robotic assisted laparoscopic left radical nephrectomy by Dr. Lovena Neighbours on January 05, 2020.  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  The final pathological staging was T3aN0 grade 2 tumor.  He remained on active surveillance and had the imaging studies in February 2022 which showed a small pleural based nodules.  Repeat CT scan in February 2023 showed an enlarging pleural nodule as well as a right lobe lung nodule and is also concerning for malignant disease.  A PET scan obtained on May 25, 2021 showed the pleural-based nodule along the right hemidiaphragm is an SUV uptake of 2.4.  A right upper lobe nodule along the minor fissure showed SUV uptake of 0.7.  The measurement is ten 5.8 cm.  He has also found to have a right renal mass that has activity similar to the surrounding renal parenchyma favoring a solid mass lesion and likely another renal cell malignancy.  Clinically, he reports reasonably well at this time with less fatigue and tiredness.  He was diagnosed with COVID-pneumonia and possibly long COVID symptoms.  He is also was found to have low testosterone level and possible endocrinopathy.  He has endocrine evaluation in the near future.  He does not report any headaches, blurry vision, syncope or seizures. Does not report any fevers, chills or sweats.  Does not report any cough, wheezing or hemoptysis.  Does not report any chest pain, palpitation, orthopnea or leg edema.  Does not report any nausea, vomiting or  abdominal pain.  Does not report any constipation or diarrhea.  Does not report any skeletal complaints.    Does not report frequency, urgency or hematuria.  Does not report any skin rashes or lesions. Does not report any heat or cold intolerance.  Does not report any lymphadenopathy or petechiae.  Does not report any anxiety or depression.  Remaining review of systems is negative.     Past Medical History:  Diagnosis Date   Cancer [facility])    Kidney Cancer   Hyperlipidemia   :   Past Surgical History:  Procedure Laterality Date   right shoulder rotator cuff repair Right    ROBOT ASSISTED LAPAROSCOPIC NEPHRECTOMY Left 01/05/2020   Procedure: XI ROBOTIC ASSISTED LAPAROSCOPIC RADICAL NEPHRECTOMY;  Surgeon: Ceasar Mons, MD;  Location: [facility];  Service: Urology;  Laterality: Left;   TOTAL HIP ARTHROPLASTY Left 03/31/2020   Procedure: LEFT TOTAL HIP ARTHROPLASTY ANTERIOR APPROACH;  Surgeon: Mcarthur Rossetti, MD;  Location: [place];  Service: Orthopedics;  Laterality: Left;  :   Current Outpatient Medications:    ascorbic acid (VITAMIN C) 500 MG tablet, Take 500 mg by mouth daily., Disp: , Rfl:    aspirin 81 MG chewable tablet, Chew 1 tablet (81 mg total) by mouth 2 (two) times daily., Disp: 30 tablet, Rfl: 0   atorvastatin (LIPITOR) 10 MG tablet, Take 10 mg by mouth at bedtime. , Disp: , Rfl:    cholecalciferol (VITAMIN D3) 25 MCG (1000 UNIT) tablet, Take 1,000 Units  by mouth daily., Disp: , Rfl:    diclofenac (VOLTAREN) 75 MG EC tablet, TAKE 1 TABLET BY MOUTH TWICE A DAY, Disp: 60 tablet, Rfl: 1   Ferrous Sulfate (IRON SLOW RELEASE PO), Take 1 tablet by mouth daily., Disp: , Rfl:    HYDROcodone-acetaminophen (NORCO/VICODIN) 5-325 MG tablet, Take 1 tablet by mouth every 6 (six) hours as needed for moderate pain., Disp: 30 tablet, Rfl: 0   methylPREDNISolone (MEDROL) 4 MG tablet, Medrol dose pack. Take as instructed, Disp: 21 tablet, Rfl: 0   Multiple Vitamin (MULTI VITAMIN)  TABS, 1 tablet, Disp: , Rfl:    Multiple Vitamins-Minerals (MULTIVITAMIN WITH MINERALS) tablet, Take 1 tablet by mouth daily., Disp: , Rfl:    Omega-3 Fatty Acids (FISH OIL) 1000 MG CAPS, Take 1,000 mg by mouth daily., Disp: , Rfl:    predniSONE (DELTASONE) 50 MG tablet, Take one tablet daily for 5 days., Disp: 5 tablet, Rfl: 0   rosuvastatin (CRESTOR) 10 MG tablet, Take by mouth., Disp: , Rfl:    vitamin E 180 MG (400 UNITS) capsule, Take 400 Units by mouth daily., Disp: , Rfl: :  No Known Allergies:  No family history on file.:   Social History   Socioeconomic History   Marital status: Single    Spouse name: Not on file   Number of children: 2   Years of education: Not on file   Highest education level: Not on file  Occupational History   Not on file  Tobacco Use   Smoking status: Former    Types: Cigarettes    Quit date: 04/03/2015    Years since quitting: 6.1   Smokeless tobacco: Never  Vaping Use   Vaping Use: Some days   Substances: Nicotine, Flavoring  Substance and Sexual Activity   Alcohol use: Yes    Comment: occas    Drug use: No   Sexual activity: Not on file  Other Topics Concern   Not on file  Social History Narrative   Not on file   Social Determinants of Health   Financial Resource Strain: Not on file  Food Insecurity: Not on file  Transportation Needs: Not on file  Physical Activity: Not on file  Stress: Not on file  Social Connections: Not on file  Intimate Partner Violence: Not on file  :  Pertinent items are noted in HPI.  Exam: Blood pressure 90/70, pulse 72, temperature 97.7 F (36.5 C), resp. rate 20, weight 203 lb 3.2 oz (92.2 kg), SpO2 94 %. ECOG 1 General appearance: alert and cooperative appeared without distress. Head: atraumatic without any abnormalities. Eyes: conjunctivae/corneas clear. PERRL.  Sclera anicteric. Throat: lips, mucosa, and tongue normal; without oral thrush or ulcers. Resp: clear to auscultation bilaterally  without rhonchi, wheezes or dullness to percussion. Cardio: regular rate and rhythm, S1, S2 normal, no murmur, click, rub or gallop GI: soft, non-tender; bowel sounds normal; no masses,  no organomegaly Skin: Skin color, texture, turgor normal. No rashes or lesions Lymph nodes: Cervical, supraclavicular, and axillary nodes normal. Neurologic: Grossly normal without any motor, sensory or deep tendon reflexes. Musculoskeletal: No joint deformity or effusion.    NM PET Image Initial (PI) Skull Base To Thigh (F-18 FDG)  Result Date: 05/29/2021 CLINICAL DATA:  Subsequent treatment strategy for left renal mass, prior left nephrectomy and adrenalectomy. New/enlarging pulmonary nodules on 05/08/2021. Known right renal mass. EXAM: NUCLEAR MEDICINE PET SKULL BASE TO THIGH TECHNIQUE: 11.3 mCi F-18 FDG was injected intravenously. Full-ring PET imaging was performed from  the skull base to thigh after the radiotracer. CT data was obtained and used for attenuation correction and anatomic localization. Fasting blood glucose: 126 mg/dl COMPARISON:  Multiple exams, including 05/08/2021 FINDINGS: Mediastinal blood pool activity: SUV max 2.2 Liver activity: SUV max N/A NECK: Physiologic activity in muscular tissues and in the glottis. Incidental CT findings: Bilateral common carotid atherosclerotic calcifications. CHEST: The pleural-based nodule along the right hemidiaphragm has maximum SUV of 2.4. Reduced accuracy due to proximity to the diaphragm which can create motion blurring during acquisition which can falsely reduce activity. A right upper lobe nodule along the minor fissure shown on image 34 of series 8 has regional maximal SUV of only 0.7. This measures 1.0 by 0.8 cm although is substantially thinner vertically which may predispose to motion blurring. Incidental CT findings: Coronary, aortic arch, and branch vessel atherosclerotic vascular disease. ABDOMEN/PELVIS: The known small right renal mass has activity  similar to the surrounding renal parenchyma, compatible with a solid mass lesion. Left lateral gluteus maximus muscular activity which is probably physiologic. Prior left nephrectomy. Incidental CT findings: Cholelithiasis. Atherosclerosis is present, including aortoiliac atherosclerotic disease. Prominent stool throughout the colon favors constipation. Descending and sigmoid colon diverticulosis. SKELETON: No significant abnormal hypermetabolic activity in this region. Incidental CT findings: Left total hip prosthesis, with some lucency along the upper margin of the acetabular shell component such that particulate disease is not excluded. Lumbar spondylosis and degenerative disc disease. Cervical and thoracic spondylosis noted. IMPRESSION: 1. The pleural-based nodule along the right hemidiaphragm has maximum SUV of 2.4 and the right upper lobe nodule along the minor fissure has a maximum SUV of 0.7. The substantial progression in size of both lesions of the last year raises suspicion for low-grade malignancy. 2. The known small right renal mass has activity similar to the surrounding renal parenchyma, favoring a solid mass lesion and likely renal cell carcinoma. 3. Other imaging findings of potential clinical significance: Aortic Atherosclerosis (ICD10-I70.0). Systemic and coronary atherosclerosis. Cholelithiasis. Descending and sigmoid colon diverticulosis. Prominent stool throughout the colon favors constipation. Possible particulate disease along the acetabular shell component of the left total hip prosthesis. Spondylosis along with lumbar degenerative disc disease. Electronically Signed   By: Van Clines M.D.   On: 05/29/2021 07:28    Assessment and Plan:    66 year old with:  1.  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  He was found to have T3AN0 grade 2 clear-cell tumor after radical nephrectomy.  He has subsequently developed 2 pulmonary nodules and  a right renal mass that is concerning for another neoplasm.  The natural course of this disease was reviewed at this time and the differential diagnosis and management choices of his presentation was discussed.  Obtaining tissue biopsy would be preferable at this time given the large primary tumor in the short interval to relapse, it is reasonable to assume given the enlargement of these pulmonary nodules that we are dealing with metastatic renal cell carcinoma.  Management options including continued active surveillance versus local therapy versus systemic therapy were debated.  Local therapy includes surgical resection versus ablative radiation approach could also be considered.  I have deferred the option of systemic therapy at this time given the low volume low risk disease at this time and consider local therapy initially and defer the option of systemic therapy if he has more widespread disease.  After discussion today, he is agreeable to have evaluation for ablative radiation therapy to the pulmonary nodules.  We will complete his staging scan with MRI of the brain to rule out CNS metastasis.  2.  Right renal mass: Unclear etiology whether this is a primary right kidney tumor versus metastatic disease.  Treatment options including partial nephrectomy versus cryoablation were discussed.  I favor obtaining tissue biopsy and potentially cryoablation and we will make the appropriate referral to interventional radiology.  3.  Follow-up: We will be in the next 3 months to follow his progress.   60  minutes were dedicated to this visit. The time was spent on reviewing pathology results, imaging studies, discussing treatment options, discussing differential diagnosis and answering questions regarding future plan.     A copy of this consult has been forwarded to the requesting physician.

## 2021-06-05 NOTE — Progress Notes (Incomplete)
GU Location of Tumor / Histology: Primary malignant neoplasm of left kidney with metastasis from kidney to other site   Biopsies of *** (if applicable) revealed: {:82500}  Past/Anticipated interventions by urology, if any:   Past/Anticipated interventions by medical oncology, if any:  Dr. Alen Blew 1.  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  He was found to have T3AN0 grade 2 clear-cell tumor after radical nephrectomy.  He has subsequently developed 2 pulmonary nodules and a right renal mass that is concerning for another neoplasm.   The natural course of this disease was reviewed at this time and the differential diagnosis and management choices of his presentation was discussed.  Obtaining tissue biopsy would be preferable at this time given the large primary tumor in the short interval to relapse, it is reasonable to assume given the enlargement of these pulmonary nodules that we are dealing with metastatic renal cell carcinoma.   Management options including continued active surveillance versus local therapy versus systemic therapy were debated.  Local therapy includes surgical resection versus ablative radiation approach could also be considered.   I have deferred the option of systemic therapy at this time given the low volume low risk disease at this time and consider local therapy initially and defer the option of systemic therapy if he has more widespread disease.   After discussion today, he is agreeable to have evaluation for ablative radiation therapy to the pulmonary nodules.  We will complete his staging scan with MRI of the brain to rule out CNS metastasis.   2.  Right renal mass: Unclear etiology whether this is a primary right kidney tumor versus metastatic disease.  Treatment options including partial nephrectomy versus cryoablation were discussed.  I favor obtaining tissue biopsy and potentially cryoablation and we will make the  appropriate referral to interventional radiology.   3.  Follow-up: We will be in the next 3 months to follow his progress.  Weight changes, if any: {:18581}  Bowel/Bladder complaints, if any: {:18581}   Nausea/Vomiting, if any: {:18581}  Pain issues, if any:  {:18581}  SAFETY ISSUES: Prior radiation? {:18581} Pacemaker/ICD? {:18581} Possible current pregnancy? Male Is the patient on methotrexate? No  Current Complaints / other details:

## 2021-06-08 ENCOUNTER — Other Ambulatory Visit: Payer: Self-pay

## 2021-06-08 ENCOUNTER — Ambulatory Visit (HOSPITAL_BASED_OUTPATIENT_CLINIC_OR_DEPARTMENT_OTHER)
Admission: RE | Admit: 2021-06-08 | Discharge: 2021-06-08 | Disposition: A | Discharge status: Home / Self Care | Payer: 59 | Source: Ambulatory Visit | Attending: Oncology | Admitting: Oncology

## 2021-06-08 DIAGNOSIS — C642 Malignant neoplasm of left kidney, except renal pelvis: Secondary | ICD-10-CM | POA: Insufficient documentation

## 2021-06-08 MED ORDER — GADOBUTROL 1 MMOL/ML IV SOLN
9.0000 mL | Freq: Once | INTRAVENOUS | Status: AC | PRN
  Administered 2021-06-08: 14:00:00 9 mL via INTRAVENOUS
  Filled 2021-06-08: qty 10

## 2021-06-13 ENCOUNTER — Other Ambulatory Visit: Payer: Self-pay

## 2021-06-13 ENCOUNTER — Encounter: Payer: Self-pay | Admitting: Oncology

## 2021-06-13 ENCOUNTER — Ambulatory Visit
Admission: RE | Admit: 2021-06-13 | Discharge: 2021-06-13 | Disposition: A | Discharge status: Home / Self Care | Payer: 59 | Source: Ambulatory Visit | Attending: Radiation Oncology | Admitting: Radiation Oncology

## 2021-06-13 VITALS — BP 121/73 | HR 79 | Temp 97.1°F | Resp 18 | Ht 69.0 in | Wt 210.4 lb

## 2021-06-13 DIAGNOSIS — Z79899 Other long term (current) drug therapy: Secondary | ICD-10-CM | POA: Diagnosis not present

## 2021-06-13 DIAGNOSIS — Z87891 Personal history of nicotine dependence: Secondary | ICD-10-CM | POA: Insufficient documentation

## 2021-06-13 DIAGNOSIS — I2510 Atherosclerotic heart disease of native coronary artery without angina pectoris: Secondary | ICD-10-CM | POA: Insufficient documentation

## 2021-06-13 DIAGNOSIS — Z7982 Long term (current) use of aspirin: Secondary | ICD-10-CM | POA: Diagnosis not present

## 2021-06-13 DIAGNOSIS — Z720 Tobacco use: Secondary | ICD-10-CM | POA: Insufficient documentation

## 2021-06-13 DIAGNOSIS — Z7952 Long term (current) use of systemic steroids: Secondary | ICD-10-CM | POA: Diagnosis not present

## 2021-06-13 DIAGNOSIS — E7800 Pure hypercholesterolemia, unspecified: Secondary | ICD-10-CM | POA: Insufficient documentation

## 2021-06-13 DIAGNOSIS — E785 Hyperlipidemia, unspecified: Secondary | ICD-10-CM | POA: Diagnosis not present

## 2021-06-13 DIAGNOSIS — C641 Malignant neoplasm of right kidney, except renal pelvis: Secondary | ICD-10-CM | POA: Insufficient documentation

## 2021-06-13 DIAGNOSIS — C649 Malignant neoplasm of unspecified kidney, except renal pelvis: Secondary | ICD-10-CM | POA: Insufficient documentation

## 2021-06-13 DIAGNOSIS — C7801 Secondary malignant neoplasm of right lung: Secondary | ICD-10-CM | POA: Diagnosis not present

## 2021-06-13 DIAGNOSIS — I700 Atherosclerosis of aorta: Secondary | ICD-10-CM | POA: Insufficient documentation

## 2021-06-13 DIAGNOSIS — Z85528 Personal history of other malignant neoplasm of kidney: Secondary | ICD-10-CM | POA: Insufficient documentation

## 2021-06-13 DIAGNOSIS — M1990 Unspecified osteoarthritis, unspecified site: Secondary | ICD-10-CM | POA: Insufficient documentation

## 2021-06-13 DIAGNOSIS — Z860100 Personal history of colon polyps, unspecified: Secondary | ICD-10-CM | POA: Insufficient documentation

## 2021-06-13 DIAGNOSIS — R7989 Other specified abnormal findings of blood chemistry: Secondary | ICD-10-CM | POA: Insufficient documentation

## 2021-06-13 DIAGNOSIS — Z8639 Personal history of other endocrine, nutritional and metabolic disease: Secondary | ICD-10-CM | POA: Insufficient documentation

## 2021-06-13 DIAGNOSIS — F488 Other specified nonpsychotic mental disorders: Secondary | ICD-10-CM | POA: Insufficient documentation

## 2021-06-13 DIAGNOSIS — K8020 Calculus of gallbladder without cholecystitis without obstruction: Secondary | ICD-10-CM | POA: Insufficient documentation

## 2021-06-13 NOTE — Progress Notes (Signed)
?Radiation Oncology         [phone] ?________________________________ ? ?Initial outpatient Consultation ? ?Name: Marc [name] MRN: [id number]  ?Date of Service: 06/13/2021 DOB: 1956-02-12 ? ?AQ:[id number], [place], [place]  [place], [name], MD  ? ?REFERRING PHYSICIAN: [name], MD ? ?DIAGNOSIS: 66 y/o male with metastatic renal cell carcinoma to the right lung ? ?  ICD-10-CM   ?1. Renal cell carcinoma of right kidney (HCC)  C64.1   ?  ?2. Secondary renal cell carcinoma of lung, right (HCC)  C78.01   ? C64.9   ?  ? ? ?HISTORY OF PRESENT ILLNESS: Marc [name] is a 66 y.o. male seen at the request of Dr. Alen Blew. He was initially diagnosed with renal cell carcinoma in 11/2019. In summary, he was incidentally found to have a left renal mass on routine lung cancer screening chest CT. Further evaluation with CT AP on 11/16/19 confirmed a 10.5 cm left renal mass, consistent with renal cell carcinoma, abutting Georta's fascia without evidence of extension through the fascia. Additionally, there was a 10 mm left paraaortic lymph node and a 1.4 cm, indeterminate, lesion in the upper pole of the right kidney. He was referred to Dr. Lovena Neighbours and subsequently underwent a robotic assisted laparoscopic radical left nephrectomy on 01/05/20. Final surgical pathology confirmed  a 10.7 cm, T3a, grade 2 clear cell renal cell carcinoma extending into the perinephric tissue but with negative surgical margins and no lymph node involvement. ? ?He has been on active surveillance since the time of surgery and was noted to have some small pleural-based nodules on chest CT in 05/2020. On his most recent restaging CT chest in 05/2021 there was interval enlaregment of the pleural nodule, as well as a right upper lobe lung nodule. This was further evaluated with a PET scan on 05/25/21 that showed mild hypermetabolism of the pleural-based nodule along the right hemidiaphragm with SUV max of 2.4 and mild hypermetabolism in the RUL nodule along the  minor fissure with SUV max of 0.7.  The substantial progression in size of both lesions of the last year raises suspicion for low-grade malignancy and the known right renal mass also had an activity level similar to the surrounding renal parenchyma, concerning for renal cell carcinoma. ? ?He met with Dr. Alen Blew in consultation on 06/01/21, who recommended a brain MRI to complete his staging and local therapy to the pulmonary nodules and right renal mass given his low volume of disease progression. Brain MRI was performed on 06/08/21 and showed a 1.3 cm enhancing lesion in the sella with imaging features in keeping with a pituitary macroadenoma, though metastatic disease cannot be excluded by imaging. The recommendation is for repeat brain MRI in 2-3 months for close monitoring.  He has been kindly referred today for discussion of potential radiotherapy options in the management of the new pulmonary nodules. He has also been referred to interventional radiology for further evaluation and treatment of the right renal lesion with biopsy and cryoablation. ? ?PREVIOUS RADIATION THERAPY: No ? ?PAST MEDICAL HISTORY:  ?Past Medical History:  ?Diagnosis Date  ? Cancer [facility])   ? Kidney Cancer  ? Hyperlipidemia   ?   ? ?PAST SURGICAL HISTORY: ?Past Surgical History:  ?Procedure Laterality Date  ? right shoulder rotator cuff repair Right   ? ROBOT ASSISTED LAPAROSCOPIC NEPHRECTOMY Left 01/05/2020  ? Procedure: XI ROBOTIC ASSISTED LAPAROSCOPIC RADICAL NEPHRECTOMY;  Surgeon: Ceasar Mons, MD;  Location: [facility];  Service: Urology;  Laterality:  Left;  ? TOTAL HIP ARTHROPLASTY Left 03/31/2020  ? Procedure: LEFT TOTAL HIP ARTHROPLASTY ANTERIOR APPROACH;  Surgeon: Mcarthur Rossetti, MD;  Location: [place];  Service: Orthopedics;  Laterality: Left;  ? ? ?FAMILY HISTORY: No family history on file. ? ?SOCIAL HISTORY:  ?Social History  ? ?Socioeconomic History  ? Marital status: Single  ?  Spouse name: Not on file  ? Number of  children: 2  ? Years of education: Not on file  ? Highest education level: Not on file  ?Occupational History  ? Not on file  ?Tobacco Use  ? Smoking status: Former  ?  Types: Cigarettes  ?  Quit date: 04/03/2015  ?  Years since quitting: 6.2  ? Smokeless tobacco: Never  ?Vaping Use  ? Vaping Use: Some days  ? Substances: Nicotine, Flavoring  ?Substance and Sexual Activity  ? Alcohol use: Yes  ?  Comment: occas   ? Drug use: No  ? Sexual activity: Not on file  ?Other Topics Concern  ? Not on file  ?Social History Narrative  ? Not on file  ? ?Social Determinants of Health  ? ?Financial Resource Strain: Not on file  ?Food Insecurity: Not on file  ?Transportation Needs: Not on file  ?Physical Activity: Not on file  ?Stress: Not on file  ?Social Connections: Not on file  ?Intimate Partner Violence: Not on file  ? ? ?ALLERGIES: Patient has no known allergies. ? ?MEDICATIONS:  ?Current Outpatient Medications  ?Medication Sig Dispense Refill  ? ascorbic acid (VITAMIN C) 500 MG tablet Take 500 mg by mouth daily.    ? aspirin 81 MG chewable tablet Chew 1 tablet (81 mg total) by mouth 2 (two) times daily. 30 tablet 0  ? atorvastatin (LIPITOR) 10 MG tablet Take 10 mg by mouth at bedtime.     ? atorvastatin (LIPITOR) 10 MG tablet Take 1 tablet by mouth daily.    ? cholecalciferol (VITAMIN D3) 25 MCG (1000 UNIT) tablet Take 1,000 Units by mouth daily.    ? diclofenac (VOLTAREN) 75 MG EC tablet TAKE 1 TABLET BY MOUTH TWICE A DAY 60 tablet 1  ? Ferrous Sulfate (IRON SLOW RELEASE PO) Take 1 tablet by mouth daily.    ? methylPREDNISolone (MEDROL) 4 MG tablet Medrol dose pack. Take as instructed 21 tablet 0  ? Multiple Vitamin (MULTI VITAMIN) TABS 1 tablet    ? Multiple Vitamins-Minerals (MULTIVITAMIN WITH MINERALS) tablet Take 1 tablet by mouth daily.    ? Omega-3 Fatty Acids (FISH OIL) 1000 MG CAPS Take 1,000 mg by mouth daily.    ? predniSONE (DELTASONE) 50 MG tablet Take one tablet daily for 5 days. 5 tablet 0  ? rosuvastatin  (CRESTOR) 10 MG tablet Take by mouth.    ? vitamin E 180 MG (400 UNITS) capsule Take 400 Units by mouth daily.    ? ?No current facility-administered medications for this encounter.  ? ? ?REVIEW OF SYSTEMS:  On review of systems, the patient reports that he is doing well overall. He denies any chest pain, shortness of breath, cough, fevers, chills, night sweats. He reports unintended weight loss of 30-40 lbs from November 2022 to present. He denies any bowel or bladder disturbances, and denies abdominal pain, nausea or vomiting. He denies any new musculoskeletal or joint aches or pains. A complete review of systems is obtained and is otherwise negative. ? ?  ?PHYSICAL EXAM:  ?Wt Readings from Last 3 Encounters:  ?06/13/21 210 lb 6 oz (95.4 kg)  ?  06/01/21 203 lb 3.2 oz (92.2 kg)  ?03/31/20 231 lb 1.6 oz (104.8 kg)  ? ?Temp Readings from Last 3 Encounters:  ?06/13/21 (!) 97.1 ?F (36.2 ?C) (Temporal)  ?06/01/21 97.7 ?F (36.5 ?C)  ?04/01/20 98.3 ?F (36.8 ?C) (Oral)  ? ?BP Readings from Last 3 Encounters:  ?06/13/21 121/73  ?06/01/21 90/70  ?04/01/20 132/79  ? ?Pulse Readings from Last 3 Encounters:  ?06/13/21 79  ?06/01/21 72  ?04/01/20 97  ? ?Pain Assessment ?Pain Score: 1  ?Pain Loc: Knee (right)/10 ? ?In general this is a well appearing Caucasian male in no acute distress. He 's alert and oriented x4 and appropriate throughout the examination. Cardiopulmonary assessment is negative for acute distress and he exhibits normal effort.  ? ?KPS = 100 ? ?100 - Normal; no complaints; no evidence of disease. ?90   - Able to carry on normal activity; minor signs or symptoms of disease. ?80   - Normal activity with effort; some signs or symptoms of disease. ?62   - Cares for self; unable to carry on normal activity or to do active work. ?60   - Requires occasional assistance, but is able to care for most of his personal needs. ?50   - Requires considerable assistance and frequent medical care. ?54   - Disabled; requires special  care and assistance. ?30   - Severely disabled; hospital admission is indicated although death not imminent. ?20   - Very sick; hospital admission necessary; active supportive treatment necessary. ?10   - M

## 2021-06-15 NOTE — Progress Notes (Signed)
?  Radiation Oncology         [phone] ?________________________________ ? ?Name: Marc [name] MRN: [id number]  ?Date: 06/16/2021  DOB: 05-Oct-1955 ? ?STEREOTACTIC BODY RADIOTHERAPY ?SIMULATION AND TREATMENT PLANNING NOTE ? ?  ICD-10-CM   ?1. Secondary renal cell carcinoma of lung, right (HCC)  C78.01   ? C64.9   ?  ?2. Renal cell carcinoma of right kidney (HCC)  C64.1   ?  ? ? ?DIAGNOSIS: 66 yo man with two nodules of metastatic renal cell carcinoma to the right upper lung and right lower lung ? ?NARRATIVE:  The patient was brought to the [facility].  Identity was confirmed.  All relevant records and images related to the planned course of therapy were reviewed.  The patient freely provided informed written consent to proceed with treatment after reviewing the details related to the planned course of therapy. The consent form was witnessed and verified by the simulation staff.  Then, the patient was set-up in a stable reproducible  supine position for radiation therapy.  A BodyFix immobilization pillow was fabricated for reproducible positioning.  Surface markings were placed.  The CT images were loaded into the planning software.  The gross target volumes (GTV) and planning target volumes (PTV) were delinieated, and avoidance structures were contoured.  Treatment planning then occurred.  The radiation prescription was entered and confirmed.  A total of two complex treatment devices were fabricated in the form of the BodyFix immobilization pillow and a neck accuform cushion.  I have requested : 3D Simulation  I have requested a DVH of the following structures: targets and all normal structures near the target including right lung, left lung, chest wall, skin, spinal cord, heart and others as noted on the radiation plan to maintain doses in adherence with established limits ? ?SPECIAL TREATMENT PROCEDURE:  The planned course of therapy using radiation constitutes a special treatment procedure.  Special care is required in the management of this patient for the following reasons. High dose per fraction requiring special monitoring for increased toxicities of treatment including daily imaging..  The special nature of the planned course of radiotherapy will require increased physician supervision and oversight to ensure patient's safety with optimal treatment outcomes.    This requires extended time and effort.   ? ?PLAN:  The patient will receive 50 Gy in 5 fractions to both lesions. ? ?________________________________ ? ?Sheral Apley Tammi Klippel, M.D. ? ?

## 2021-06-16 ENCOUNTER — Other Ambulatory Visit: Payer: Self-pay

## 2021-06-16 ENCOUNTER — Ambulatory Visit
Admission: RE | Admit: 2021-06-16 | Discharge: 2021-06-16 | Disposition: A | Discharge status: Home / Self Care | Payer: 59 | Source: Ambulatory Visit | Attending: Radiation Oncology | Admitting: Radiation Oncology

## 2021-06-16 DIAGNOSIS — C641 Malignant neoplasm of right kidney, except renal pelvis: Secondary | ICD-10-CM | POA: Diagnosis present

## 2021-06-16 DIAGNOSIS — Z510 Encounter for antineoplastic radiation therapy: Secondary | ICD-10-CM | POA: Insufficient documentation

## 2021-06-16 DIAGNOSIS — C7801 Secondary malignant neoplasm of right lung: Secondary | ICD-10-CM | POA: Diagnosis present

## 2021-06-19 ENCOUNTER — Encounter: Payer: Self-pay | Admitting: Internal Medicine

## 2021-06-19 ENCOUNTER — Other Ambulatory Visit: Payer: Self-pay

## 2021-06-19 ENCOUNTER — Ambulatory Visit: Payer: 59 | Admitting: Internal Medicine

## 2021-06-19 VITALS — BP 130/80 | HR 80 | Ht 69.0 in | Wt 209.0 lb

## 2021-06-19 DIAGNOSIS — D352 Benign neoplasm of pituitary gland: Secondary | ICD-10-CM | POA: Diagnosis not present

## 2021-06-19 DIAGNOSIS — R7989 Other specified abnormal findings of blood chemistry: Secondary | ICD-10-CM

## 2021-06-19 DIAGNOSIS — E2749 Other adrenocortical insufficiency: Secondary | ICD-10-CM | POA: Insufficient documentation

## 2021-06-19 MED ORDER — HYDROCORTISONE 10 MG PO TABS: 10.0000 mg | ORAL_TABLET | ORAL | 1 refills | Status: DC

## 2021-06-19 NOTE — Patient Instructions (Signed)
Start Hydrocortisone 10 mg, take ONE AND A HALF tablets with Breakfast and ONE tablet between 2-4 pm daily  ? ? ? ? ?ADRENAL INSUFFICIENCY SICK DAY RULES: ? ?Should you face an extreme emotional or physical stress such as trauma, surgery or acute illness, this will require extra steroid coverage so that the body can meet that stress.  ? ?Without increasing the steroid dose you may experience severe weakness, headache, dizziness, nausea and vomiting and possibly a more serious deterioration in health.  ?Typically the dose of steroids will only need to be increased for a couple of days if you have an illness that is transient and managed in the community.  ? ?If you are unable to take/absorb an increased dose of steroids orally because of vomiting or diarrhea, you will urgently require steroid injections and should present to an Emergency Department. ? ?The general advice for any serious illness is as follows: ?Double the normal daily steroid dose for up to 3 days if you have a temperature of more than 37.50C (99.58F) with signs of sickness, or severe emotional or physical distress ?Contact your primary care doctor and Endocrinologist if the illness worsens or it lasts for more than 3 days.  ?In cases of severe illness, urgent medical assistance should be promptly sought. ?If you experience vomiting/diarrhea or are unable to take steroids by mouth, please seek urgent medical help. ? ? ?

## 2021-06-19 NOTE — Progress Notes (Signed)
? ? ?Name: Marc [name]  ?MRN/ DOB: [id number], November 01, 1955    ?Age/ Sex: 66 y.o., male   ? ?PCP: [name], PA   ?Reason for Endocrinology Evaluation: Low testosterone   ?   ?Date of Initial Endocrinology Evaluation: 06/19/2021   ? ? ?HPI: ?[name] is a 66 y.o. male with a past medical history of Hx of renal cell carcinoma . The patient presented for initial endocrinology clinic visit on 06/19/2021 for consultative assistance with his Low testosterone .  ? ?Mr. Mcdiarmid was referred her for further evaluation of low testosterone and LH this was during work up for low libido. He denies erectile dysfunction but has noted decrease in spontaneous  ? ? ? ? ? ?Of note, he is S/P left nephrectomy due to [place] with Mets to the lungs and  ? ?He is undergoing radiation therapy starting next week and no immunotherapy  ? ?He follows with Dr. Osker Mason for Oncology  ?Follows with Dr. Ninfa Linden for back pain and LE weakness  ? ?He has never been on testosterone in the past  ?Had [place] in 01/2021 followed by long COVID, he felt terrible until he went on prednisone  which has helped  ? ?1/5th he saw ortho for leg weakness, PT worsened his condition . Steroid improved  his condition after taking them for  5 days , after that he noted decrease appetite, lost 40 lbs and feeling poorly again. Saw PCP 2/20th started on Prednisone 40 mg for 5 days.  ?He received another refill 3/3rd and has been taking 10 mg daily  ? ? ?He has no prostate cancer  ? ?No CAD  ?No FH of prostate cancer  ?He is an ex-smoke  ?Denies SOB recently  ? ?Denies headaches  ?Denies vision changes - Last eye exam 1.5 yrs ago  ? ? ? ? ? ? ?HISTORY:  ?Past Medical History:  ?Past Medical History:  ?Diagnosis Date  ? Cancer [facility])   ? Kidney Cancer  ? Hyperlipidemia   ? ?Past Surgical History:  ?Past Surgical History:  ?Procedure Laterality Date  ? right shoulder rotator cuff repair Right   ? ROBOT ASSISTED LAPAROSCOPIC NEPHRECTOMY Left 01/05/2020  ? Procedure: XI  ROBOTIC ASSISTED LAPAROSCOPIC RADICAL NEPHRECTOMY;  Surgeon: Ceasar Mons, MD;  Location: [facility];  Service: Urology;  Laterality: Left;  ? TOTAL HIP ARTHROPLASTY Left 03/31/2020  ? Procedure: LEFT TOTAL HIP ARTHROPLASTY ANTERIOR APPROACH;  Surgeon: Mcarthur Rossetti, MD;  Location: [place];  Service: Orthopedics;  Laterality: Left;  ?  ?Social History:  reports that he quit smoking about 6 years ago. His smoking use included cigarettes. He has never used smokeless tobacco. He reports current alcohol use. He reports that he does not use drugs. ?Family History: family history is not on file. ? ? ?HOME MEDICATIONS: ?Allergies as of 06/19/2021   ?No Known Allergies ?  ? ?  ?Medication List  ?  ? ?  ? Accurate as of June 19, 2021  4:08 PM. If you have any questions, ask your nurse or doctor.  ?  ?  ? ?  ? ?STOP taking these medications   ? ?methylPREDNISolone 4 MG tablet ?Commonly known as: Medrol ?Stopped by: Dorita Sciara, MD ?  ?predniSONE 50 MG tablet ?Commonly known as: DELTASONE ?Stopped by: Dorita Sciara, MD ?  ?rosuvastatin 10 MG tablet ?Commonly known as: CRESTOR ?Stopped by: Dorita Sciara, MD ?  ? ?  ? ?TAKE these medications   ? ?  ascorbic acid 500 MG tablet ?Commonly known as: VITAMIN C ?Take 500 mg by mouth daily. ?  ?aspirin 81 MG chewable tablet ?Chew 1 tablet (81 mg total) by mouth 2 (two) times daily. ?  ?atorvastatin 10 MG tablet ?Commonly known as: LIPITOR ?Take 1 tablet by mouth daily. ?What changed: Another medication with the same name was removed. Continue taking this medication, and follow the directions you see here. ?Changed by: Dorita Sciara, MD ?  ?cholecalciferol 25 MCG (1000 UNIT) tablet ?Commonly known as: VITAMIN D3 ?Take 1,000 Units by mouth daily. ?  ?diclofenac 75 MG EC tablet ?Commonly known as: VOLTAREN ?TAKE 1 TABLET BY MOUTH TWICE A DAY ?  ?Fish Oil 1000 MG Caps ?Take 1,000 mg by mouth daily. ?  ?hydrocortisone 10 MG tablet ?Commonly known  as: Cortef ?Take 1 tablet (10 mg total) by mouth as directed. 1.5 tabs every morning and 1 tablet at 4 pm ?Started by: Dorita Sciara, MD ?  ?IRON SLOW RELEASE PO ?Take 1 tablet by mouth daily. ?  ?Multi Vitamin Tabs ?1 tablet ?  ?multivitamin with minerals tablet ?Take 1 tablet by mouth daily. ?  ?vitamin E 180 MG (400 UNITS) capsule ?Take 400 Units by mouth daily. ?  ? ?  ?  ? ? ?REVIEW OF SYSTEMS: ?A comprehensive ROS was conducted with the patient and is negative except as per HPI ? ? ? ?OBJECTIVE:  ?VS: BP 130/80 (BP Location: Left Arm, Patient Position: Sitting, Cuff Size: Small)   Pulse 80   Ht '5\' 9"'$  (1.753 m)   Wt 209 lb (94.8 kg)   SpO2 99%   BMI 30.86 kg/m?   ? ?Wt Readings from Last 3 Encounters:  ?06/19/21 209 lb (94.8 kg)  ?06/13/21 210 lb 6 oz (95.4 kg)  ?06/01/21 203 lb 3.2 oz (92.2 kg)  ? ?Body surface area is 2.15 meters squared. ? ? ?EXAM: ?General: Pt appears well and is in NAD  ?Neck: General: Supple without adenopathy. ?Thyroid: Thyroid size normal.  No goiter or nodules appreciated.   ?Chest :  NO glandular breast tissue   ?Lungs: Clear with good BS bilat with no rales, rhonchi, or wheezes  ?Heart: Auscultation: RRR.  ?Abdomen: Normoactive bowel sounds, soft, nontender, without masses or organomegaly palpable  ?Genital Exam:  Testicular volume 20 mL B/L   ?Extremities:  ?BL LE: No pretibial edema normal ROM and strength.  ?Mental Status: Judgment, insight: Intact ?Orientation: Oriented to time, place, and person ?Mood and affect: No depression, anxiety, or agitation  ? ? ? ?DATA REVIEWED: ? ?  ? Latest Reference Range & Units 06/20/21 08:05  ?Sodium 135 - 145 mEq/L 140  ?Potassium 3.5 - 5.1 mEq/L 4.6  ?Chloride 96 - 112 mEq/L 104  ?CO2 19 - 32 mEq/L 28  ?Glucose 70 - 99 mg/dL 88  ?BUN 6 - 23 mg/dL 29 (H)  ?Creatinine 0.40 - 1.50 mg/dL 1.11  ?Calcium 8.4 - 10.5 mg/dL 9.2  ?GFR >60.00 mL/min 70.49  ? ? Latest Reference Range & Units 06/20/21 08:05  ?Cortisol, Plasma ug/dL 0.2  ?LH 1.50  - 9.30 mIU/mL 0.24 (L)  ?Prolactin 2.0 - 18.0 ng/mL 20.0 (H)  ?Glucose 70 - 99 mg/dL 88  ?TSH 0.35 - 5.50 uIU/mL 1.18  ?T4,Free(Direct) 0.60 - 1.60 ng/dL 0.36 (L)  ?PSA 0.10 - 4.00 ng/mL 0.00 (L)  ? ? ?ACTH-pending ?Testosterone-pending ? ? ?04/19/2021 ?Testosterone < 10 ng/dL  ?LH 0.4 mIU/ml ( 1.7-8.6) ?Tg 330 ?HDL 33 ?LDL 90  ?H/H  12.5/37.1 ? ? ? ?Brain MRI 06/08/2021 ? ? ?EXAM: ?MRI HEAD WITHOUT AND WITH CONTRAST ?  ?TECHNIQUE: ?Multiplanar, multiecho pulse sequences of the brain and surrounding ?structures were obtained without and with intravenous contrast. ?  ?CONTRAST:  57m GADAVIST GADOBUTROL 1 MMOL/ML IV SOLN ?  ?COMPARISON:  CT head 07/16/2013 ?  ?FINDINGS: ?Brain: There is no evidence of acute intracranial hemorrhage, ?extra-axial fluid collection, or acute infarct. ?  ?Parenchymal volume is normal. The ventricles are normal in size. ?Scattered small foci of FLAIR signal abnormality in the subcortical ?and periventricular white matter likely reflects sequela of mild ?chronic white matter microangiopathy. ?  ?There is a homogeneously enhancing lesion in the sella measuring 1.3 ?cm cc by 1.2 cm AP by 1.0 cm TV. There is associated thickening of ?the pituitary infundibulum. The lesion extends into the suprasellar ?cistern but does not exert mass effect on the optic chiasm. This ?lesion was not definitely present on the CT head from 07/16/2013, ?though the sella is suboptimally evaluated due to thick slices. ?  ?There is no other abnormal enhancement. There is no mass effect or ?midline shift. ?  ?Vascular: Normal flow voids. ?  ?Skull and upper cervical spine: Normal marrow signal. ?  ?Sinuses/Orbits: The paranasal sinuses are clear. The globes and ?orbits are unremarkable. ?  ?Other: There is asymmetric atrophy of the right parotid gland. ?  ?IMPRESSION: ?1. 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 Consider follow up in 2-3 months to assess for ?growth. ?2. No other abnormal enhancement. ?  ? ?ASSESSMENT/PLAN/RECOMMENDATIONS:  ? ?Pituitar

## 2021-06-20 ENCOUNTER — Other Ambulatory Visit (INDEPENDENT_AMBULATORY_CARE_PROVIDER_SITE_OTHER): Payer: 59

## 2021-06-20 DIAGNOSIS — R7989 Other specified abnormal findings of blood chemistry: Secondary | ICD-10-CM | POA: Diagnosis not present

## 2021-06-20 LAB — T4, FREE: Free T4: 0.36 ng/dL — ABNORMAL LOW (ref 0.60–1.60)

## 2021-06-20 LAB — BASIC METABOLIC PANEL
BUN: 29 mg/dL — ABNORMAL HIGH (ref 6–23)
CO2: 28 mEq/L (ref 19–32)
Calcium: 9.2 mg/dL (ref 8.4–10.5)
Chloride: 104 mEq/L (ref 96–112)
Creatinine, Ser: 1.11 mg/dL (ref 0.40–1.50)
GFR: 70.49 mL/min (ref >60.00)
Glucose, Bld: 88 mg/dL (ref 70–99)
Potassium: 4.6 mEq/L (ref 3.5–5.1)
Sodium: 140 mEq/L (ref 135–145)

## 2021-06-20 LAB — TSH: TSH: 1.18 u[IU]/mL (ref 0.35–5.50)

## 2021-06-20 LAB — CORTISOL: Cortisol, Plasma: 0.2 ug/dL

## 2021-06-20 LAB — PSA: PSA: 0 ng/mL — ABNORMAL LOW (ref 0.10–4.00)

## 2021-06-20 LAB — LUTEINIZING HORMONE: LH: 0.24 m[IU]/mL — ABNORMAL LOW (ref 1.50–9.30)

## 2021-06-21 MED ORDER — LEVOTHYROXINE SODIUM 50 MCG PO TABS: 50.0000 ug | ORAL_TABLET | Freq: Every day | ORAL | 3 refills | Status: DC

## 2021-06-22 DIAGNOSIS — Z510 Encounter for antineoplastic radiation therapy: Secondary | ICD-10-CM | POA: Diagnosis not present

## 2021-06-23 LAB — ACTH: C206 ACTH: <5 pg/mL — ABNORMAL LOW (ref 6–50)

## 2021-06-23 LAB — TESTOSTERONE, TOTAL, LC/MS/MS: Testosterone, Total, LC-MS-MS: 5 ng/dL — ABNORMAL LOW (ref 250–1100)

## 2021-06-23 LAB — PROLACTIN: Prolactin: 20 ng/mL — ABNORMAL HIGH (ref 2.0–18.0)

## 2021-06-27 ENCOUNTER — Other Ambulatory Visit: Payer: Self-pay

## 2021-06-27 ENCOUNTER — Ambulatory Visit: Payer: 59 | Admitting: Oncology

## 2021-06-27 ENCOUNTER — Ambulatory Visit: Admission: RE | Admit: 2021-06-27 | Payer: 59 | Source: Ambulatory Visit | Admitting: Radiation Oncology

## 2021-06-28 ENCOUNTER — Ambulatory Visit: Payer: 59 | Admitting: Radiation Oncology

## 2021-06-29 ENCOUNTER — Other Ambulatory Visit: Payer: Self-pay

## 2021-06-29 ENCOUNTER — Telehealth: Payer: Self-pay | Admitting: Internal Medicine

## 2021-06-29 ENCOUNTER — Ambulatory Visit
Admission: RE | Admit: 2021-06-29 | Discharge: 2021-06-29 | Disposition: A | Discharge status: Home / Self Care | Payer: 59 | Source: Ambulatory Visit | Attending: Radiation Oncology | Admitting: Radiation Oncology

## 2021-06-29 DIAGNOSIS — Z510 Encounter for antineoplastic radiation therapy: Secondary | ICD-10-CM | POA: Diagnosis not present

## 2021-06-29 DIAGNOSIS — C649 Malignant neoplasm of unspecified kidney, except renal pelvis: Secondary | ICD-10-CM

## 2021-06-29 MED ORDER — NATESTO 5.5 MG/ACT NA GEL
2.0000 | Freq: Two times a day (BID) | NASAL | 5 refills | Status: DC
Start: 2021-06-29 — End: 2021-10-20

## 2021-06-29 NOTE — Telephone Encounter (Signed)
-----   Message from [name], MD sent at 06/26/2021  9:08 AM EDT ----- ?Good morning. Thank you for reaching out.  ? ?I do not have any objections to testosterone replacement.  Overall, I feel the benefits of testosterone replacement outweighs the risk.  His kidney cancer remains limited in volume and the risk of thromboembolism, although is there, remains relatively low.  I agree that bone health and wellbeing would benefit greatly from testosterone replacement. ? ?Please do not hesitate to reach out in the future if you have any further questions. ? ?FS ?----- Message ----- ?From: Toua Stites, Melanie Crazier, MD ?Sent: 06/26/2021   9:00 AM EDT ?To: [name], MD ? ?Good morning Dr. Alen Blew ? ? ? ?I am reaching out to you to see what your thoughts are on starting Mr. Coppolino on testosterone replacement therapy.  I am one of the endocrinologist here at [facility] endocrinology, and he was referred to me for low testosterone levels. ? ? ?In further evaluating Mr. Koloski he was noted to have hypopituitarism, pituitary MRI showed macroadenoma.  I did explain to him that metastatic lesion to the brain cannot be excluded at this time and it is a possibility. ? ? ?He is currently on hydrocortisone and levothyroxine.  His testosterone is undetectable , which will affect his bone health and wellbeing in general.  But also that we will put him at increased risk for thromboembolic events and the possibility of increased cardiovascular risk associated with testosterone use ? ?I guess I would need some help from you in determining whether testosterone therapy benefits will outweigh the risk at this time or the risk will outweigh the benefit , since you are following his renal cancer? ? ? ?Thank you for your input ?Abby  ? ? ? ?Mack Guise, MD ? ?[facility]  ?[facility] ?[address] ?[place], [place] [address] ?Phone: [phone] ?FAX: [phone] ? ? ? ? ? ?

## 2021-06-29 NOTE — Telephone Encounter (Signed)
Spoke to Marc [name] on 06/29/2021 at 1:30 PM ? ? ? ?Discussed his condition of hypopituitarism ? ? ?He is clinically feeling better since starting hydrocortisone and levothyroxine ? ? ?Today we discussed his undetectable testosterone and the importance of replacement due to bone health and wellbeing ? ? ?We did discuss his increased risk of thromboembolic events ? ? ?I did recommend Natesto1 pump to each nostril twice daily ? ? ?Patient expressed understanding ?

## 2021-06-30 ENCOUNTER — Ambulatory Visit: Payer: 59 | Admitting: Radiation Oncology

## 2021-07-03 ENCOUNTER — Other Ambulatory Visit: Payer: Self-pay

## 2021-07-03 ENCOUNTER — Ambulatory Visit
Admission: RE | Admit: 2021-07-03 | Discharge: 2021-07-03 | Disposition: A | Discharge status: Home / Self Care | Payer: 59 | Source: Ambulatory Visit | Attending: Radiation Oncology | Admitting: Radiation Oncology

## 2021-07-03 DIAGNOSIS — C649 Malignant neoplasm of unspecified kidney, except renal pelvis: Secondary | ICD-10-CM | POA: Insufficient documentation

## 2021-07-03 DIAGNOSIS — C7801 Secondary malignant neoplasm of right lung: Secondary | ICD-10-CM | POA: Insufficient documentation

## 2021-07-05 ENCOUNTER — Ambulatory Visit
Admission: RE | Admit: 2021-07-05 | Discharge: 2021-07-05 | Disposition: A | Discharge status: Home / Self Care | Payer: 59 | Source: Ambulatory Visit | Attending: Radiation Oncology | Admitting: Radiation Oncology

## 2021-07-05 ENCOUNTER — Encounter: Payer: Self-pay | Admitting: Urology

## 2021-07-05 ENCOUNTER — Other Ambulatory Visit: Payer: Self-pay

## 2021-07-05 DIAGNOSIS — C7801 Secondary malignant neoplasm of right lung: Secondary | ICD-10-CM | POA: Diagnosis not present

## 2021-07-05 DIAGNOSIS — C649 Malignant neoplasm of unspecified kidney, except renal pelvis: Secondary | ICD-10-CM

## 2021-07-07 ENCOUNTER — Ambulatory Visit: Payer: 59 | Admitting: Radiation Oncology

## 2021-07-10 ENCOUNTER — Ambulatory Visit: Payer: 59 | Admitting: Radiation Oncology

## 2021-07-11 ENCOUNTER — Ambulatory Visit: Payer: 59 | Admitting: Radiation Oncology

## 2021-07-12 ENCOUNTER — Encounter: Payer: Self-pay | Admitting: Oncology

## 2021-07-12 ENCOUNTER — Other Ambulatory Visit: Payer: Self-pay | Admitting: Oncology

## 2021-07-12 DIAGNOSIS — C642 Malignant neoplasm of left kidney, except renal pelvis: Secondary | ICD-10-CM

## 2021-07-14 ENCOUNTER — Ambulatory Visit
Admission: RE | Admit: 2021-07-14 | Discharge: 2021-07-14 | Disposition: A | Discharge status: Home / Self Care | Payer: 59 | Source: Ambulatory Visit | Attending: Oncology | Admitting: Oncology

## 2021-07-14 ENCOUNTER — Encounter: Payer: Self-pay | Admitting: *Deleted

## 2021-07-14 DIAGNOSIS — C642 Malignant neoplasm of left kidney, except renal pelvis: Secondary | ICD-10-CM

## 2021-07-14 HISTORY — PX: IR RADIOLOGIST EVAL & MGMT: IMG5224

## 2021-07-14 NOTE — Consult Note (Signed)
? ? ?Chief Complaint: ? ?Metastatic renal cell carcinoma ? ?Referring Physician(s): ?Zola Button N ? ?History of Present Illness: ?Marc [name] is a 66 y.o. male with metastatic renal cell carcinoma.  He was found to have a large left renal cell carcinoma by CT back in July 2021.  Lesion measured up to 10 cm.  He underwent robotic assisted laparoscopic left radical nephrectomy 01/05/2020.  Pathology consistent with clear-cell renal cell carcinoma.  He is continued on active surveillance.  Please note surveillance imaging is performed at [facility] urology [facility].  Imaging studies most recently in February 2023 show 2 enlarging right lung pulmonary nodules which have undergone SBRT. ? ?Additionally, he was found to have a small medial right renal cortical mass by CT which has slightly enlarged, previously 10 mm now 16 mm.  There is also a second posterior upper pole corticomedullary right renal mass which is suspicious measuring 11 mm. ? ?Left nephrectomy site is without any suspicious enhancement or nodularity.  No retroperitoneal adenopathy. ? ?Past Medical History:  ?Diagnosis Date  ? Cancer [facility])   ? Kidney Cancer  ? Hyperlipidemia   ? ? ?Past Surgical History:  ?Procedure Laterality Date  ? IR RADIOLOGIST EVAL & MGMT  07/14/2021  ? right shoulder rotator cuff repair Right   ? ROBOT ASSISTED LAPAROSCOPIC NEPHRECTOMY Left 01/05/2020  ? Procedure: XI ROBOTIC ASSISTED LAPAROSCOPIC RADICAL NEPHRECTOMY;  Surgeon: Ceasar Mons, MD;  Location: [facility];  Service: Urology;  Laterality: Left;  ? TOTAL HIP ARTHROPLASTY Left 03/31/2020  ? Procedure: LEFT TOTAL HIP ARTHROPLASTY ANTERIOR APPROACH;  Surgeon: Mcarthur Rossetti, MD;  Location: [place];  Service: Orthopedics;  Laterality: Left;  ? ? ?Allergies: ?Patient has no known allergies. ? ?Medications: ?Prior to Admission medications   ?Medication Sig Start Date End Date Taking? Authorizing Provider  ?ascorbic acid (VITAMIN C) 500 MG tablet Take 500 mg by  mouth daily.    [provider]  ?aspirin 81 MG chewable tablet Chew 1 tablet (81 mg total) by mouth 2 (two) times daily. 04/01/20   Mcarthur Rossetti, MD  ?atorvastatin (LIPITOR) 10 MG tablet Take 1 tablet by mouth daily.    [provider]  ?cholecalciferol (VITAMIN D3) 25 MCG (1000 UNIT) tablet Take 1,000 Units by mouth daily.    [provider]  ?diclofenac (VOLTAREN) 75 MG EC tablet TAKE 1 TABLET BY MOUTH TWICE A DAY 09/05/20   Mcarthur Rossetti, MD  ?Ferrous Sulfate (IRON SLOW RELEASE PO) Take 1 tablet by mouth daily.    [provider]  ?hydrocortisone (CORTEF) 10 MG tablet Take 1 tablet (10 mg total) by mouth as directed. 1.5 tabs every morning and 1 tablet at 4 pm 06/19/21   Shamleffer, Melanie Crazier, MD  ?levothyroxine (SYNTHROID) 50 MCG tablet Take 1 tablet (50 mcg total) by mouth daily. 06/21/21   Shamleffer, Melanie Crazier, MD  ?Multiple Vitamin (MULTI VITAMIN) TABS 1 tablet    [provider]  ?Multiple Vitamins-Minerals (MULTIVITAMIN WITH MINERALS) tablet Take 1 tablet by mouth daily.    [provider]  ?Omega-3 Fatty Acids (FISH OIL) 1000 MG CAPS Take 1,000 mg by mouth daily.    [provider]  ?Testosterone (NATESTO) 5.5 MG/ACT GEL Place 2 Pump into the nose in the morning and at bedtime. 06/29/21   Shamleffer, Melanie Crazier, MD  ?vitamin E 180 MG (400 UNITS) capsule Take 400 Units by mouth daily.    [provider]  ?  ? ?No family history on file. ? ?  Social History  ? ?Socioeconomic History  ? Marital status: Single  ?  Spouse name: Not on file  ? Number of children: 2  ? Years of education: Not on file  ? Highest education level: Not on file  ?Occupational History  ? Not on file  ?Tobacco Use  ? Smoking status: Former  ?  Types: Cigarettes  ?  Quit date: 04/03/2015  ?  Years since quitting: 6.2  ? Smokeless tobacco: Never  ?Vaping Use  ? Vaping Use: Some days  ? Substances: Nicotine, Flavoring  ?Substance and  Sexual Activity  ? Alcohol use: Yes  ?  Comment: occas   ? Drug use: No  ? Sexual activity: Not on file  ?Other Topics Concern  ? Not on file  ?Social History Narrative  ? Not on file  ? ?Social Determinants of Health  ? ?Financial Resource Strain: Not on file  ?Food Insecurity: Not on file  ?Transportation Needs: Not on file  ?Physical Activity: Not on file  ?Stress: Not on file  ?Social Connections: Not on file  ? ? ?ECOG Status: ?1 - Symptomatic but completely ambulatory ? ?Review of Systems: A 12 point ROS discussed and pertinent positives are indicated in the HPI above.  All other systems are negative. ? ?Review of Systems ? ?Vital Signs: ?BP 124/78 (BP Location: Left Arm)   Pulse 79   SpO2 96%  ? ?Physical Exam ?Constitutional:   ?   General: He is not in acute distress. ?   Appearance: He is not toxic-appearing or diaphoretic.  ?Eyes:  ?   General: No scleral icterus. ?   Conjunctiva/sclera: Conjunctivae normal.  ?Cardiovascular:  ?   Rate and Rhythm: Normal rate and regular rhythm.  ?   Heart sounds: No murmur heard. ?Pulmonary:  ?   Effort: Pulmonary effort is normal.  ?   Breath sounds: Normal breath sounds.  ?Abdominal:  ?   General: Bowel sounds are normal. There is no distension.  ?   Palpations: Abdomen is soft. There is no mass.  ?   Tenderness: There is no abdominal tenderness.  ?Skin: ?   Coloration: Skin is not jaundiced.  ?Neurological:  ?   General: No focal deficit present.  ?   Mental Status: He is alert. Mental status is at baseline.  ?Psychiatric:     ?   Mood and Affect: Mood normal.     ?   Thought Content: Thought content normal.  ? ? ?  ? ?Imaging: ?IR Radiologist Eval & Mgmt ? ?Result Date: 07/14/2021 ?Please refer to notes tab for details about interventional procedure. (Op Note)  ? ?Labs: ? ?CBC: ?No results for input(s): WBC, HGB, HCT, PLT in the last 8760 hours. ? ?COAGS: ?No results for input(s): INR, APTT in the last 8760 hours. ? ?BMP: ?Recent Labs  ?  06/20/21 ?0805  ?NA 140  ?K  4.6  ?CL 104  ?CO2 28  ?GLUCOSE 88  ?BUN 29*  ?CALCIUM 9.2  ?CREATININE 1.11  ? ? ?LIVER FUNCTION TESTS: ?No results for input(s): BILITOT, AST, ALT, ALKPHOS, PROT, ALBUMIN in the last 8760 hours. ? ? ?Assessment and Plan: ? ?Metastatic renal cell carcinoma.  Status post radical robotic assisted left nephrectomy as well as SBRT of 2 enlarging right lower lung pulmonary nodules. ? ?Surveillance imaging now demonstrates 2 lesions in the right kidney one medially measuring 16 mm (demonstrating slow enlargement) and a second in the posterior upper pole measuring 11 mm.  These are  suspicious by CT for small renal neoplasms. ? ?Treatment options including cryoablation and continued surveillance were reviewed.  The cryoablation procedure, risk, benefits and alternatives were reviewed.  He understands the procedure requires general anesthesia and CT guidance.  Also, there will be ongoing surveillance posttreatment. ? ?After our discussion, he would like to proceed with image guided cryoablation at some point.  Also with the small size of both lesions currently, the lesions may be difficult to target for biopsy once the ablation needle has been inserted. ? ?Plan: Scheduled for outpatient MRI without and with contrast to evaluate the characteristics of both of the small right renal lesions and for preablation planning. ? ?Thank you for this interesting consult.  I greatly enjoyed meeting Marc [name] and look forward to participating in their care.  A copy of this report was sent to the requesting provider on this date. ? ?Electronically Signed: ?Greggory Keen ?07/14/2021, 11:33 AM ? ? ?I spent a total of  60 Minutes   in face to face in clinical consultation, greater than 50% of which was counseling/coordinating care for this patient with metastatic renal carcinoma ? ? ?

## 2021-07-21 ENCOUNTER — Other Ambulatory Visit: Payer: Self-pay | Admitting: Interventional Radiology

## 2021-07-21 DIAGNOSIS — C642 Malignant neoplasm of left kidney, except renal pelvis: Secondary | ICD-10-CM

## 2021-07-24 ENCOUNTER — Ambulatory Visit (HOSPITAL_COMMUNITY)
Admission: RE | Admit: 2021-07-24 | Discharge: 2021-07-24 | Disposition: A | Discharge status: Home / Self Care | Payer: 59 | Source: Ambulatory Visit | Attending: Interventional Radiology | Admitting: Interventional Radiology

## 2021-07-24 DIAGNOSIS — C642 Malignant neoplasm of left kidney, except renal pelvis: Secondary | ICD-10-CM | POA: Diagnosis not present

## 2021-07-24 MED ORDER — GADOBUTROL 1 MMOL/ML IV SOLN
9.0000 mL | Freq: Once | INTRAVENOUS | Status: AC | PRN
  Administered 2021-07-24: 9 mL via INTRAVENOUS

## 2021-07-25 ENCOUNTER — Telehealth: Payer: Self-pay | Admitting: Oncology

## 2021-07-25 NOTE — Telephone Encounter (Signed)
Called patient regarding upcoming appointments, patient is notified. 

## 2021-07-27 ENCOUNTER — Ambulatory Visit
Admission: RE | Admit: 2021-07-27 | Discharge: 2021-07-27 | Disposition: A | Discharge status: Home / Self Care | Payer: 59 | Source: Ambulatory Visit | Attending: Interventional Radiology | Admitting: Interventional Radiology

## 2021-07-27 ENCOUNTER — Encounter: Payer: Self-pay | Admitting: *Deleted

## 2021-07-27 DIAGNOSIS — C642 Malignant neoplasm of left kidney, except renal pelvis: Secondary | ICD-10-CM

## 2021-07-27 HISTORY — PX: IR RADIOLOGIST EVAL & MGMT: IMG5224

## 2021-07-27 NOTE — Progress Notes (Signed)
Patient ID: Marc [name], male   DOB: 06/22/1955, 66 y.o.   MRN: [id number] ?    ? ? ?Chief Complaint: ? ?Metastatic renal cell carcinoma ? ? ?Referring Physician(s): ? ?Shadad ? ? ?History of Present Illness: ?Marc [name] is a 66 y.o. male with metastatic renal cell carcinoma.  He underwent left nephrectomy for a large left renal cell carcinoma on 01/05/2020.  Left renal cell carcinoma measured up to 10 cm.  Pathology confirmed clear-cell renal cell carcinoma.  He was undergoing active surveillance with CT.  Surveillance imaging performed at [facility] urology.  Most recent CT surveillance was February 2023 confirming 2 enlarging right lung nodules which underwent successful SBRT. ? ?Additionally was found to have 2 suspicious right renal cortical masses in the upper pole 1 medial and 1 posterior.  The medial lesion has enlarged by CT over the last year now measuring 16 mm.  A second posterior upper pole cortical medullary lesion measures 10 mm. ? ?Repeat MRI confirms that both of these right renal lesions are solid enhancing suspicious for renal neoplasm/renal cell carcinoma. ? ?Today's visit is to discuss his MRI findings and treatment options. ? ?Past Medical History:  ?Diagnosis Date  ? Cancer [facility])   ? Kidney Cancer  ? Hyperlipidemia   ? ? ?Past Surgical History:  ?Procedure Laterality Date  ? IR RADIOLOGIST EVAL & MGMT  07/14/2021  ? right shoulder rotator cuff repair Right   ? ROBOT ASSISTED LAPAROSCOPIC NEPHRECTOMY Left 01/05/2020  ? Procedure: XI ROBOTIC ASSISTED LAPAROSCOPIC RADICAL NEPHRECTOMY;  Surgeon: Ceasar Mons, MD;  Location: [facility];  Service: Urology;  Laterality: Left;  ? TOTAL HIP ARTHROPLASTY Left 03/31/2020  ? Procedure: LEFT TOTAL HIP ARTHROPLASTY ANTERIOR APPROACH;  Surgeon: Mcarthur Rossetti, MD;  Location: [facility];  Service: Orthopedics;  Laterality: Left;  ? ? ?Allergies: ?Patient has no known allergies. ? ?Medications: ?Prior to Admission medications   ?Medication Sig  Start Date End Date Taking? Authorizing Provider  ?ascorbic acid (VITAMIN C) 500 MG tablet Take 500 mg by mouth daily.    [provider]  ?aspirin 81 MG chewable tablet Chew 1 tablet (81 mg total) by mouth 2 (two) times daily. 04/01/20   Mcarthur Rossetti, MD  ?atorvastatin (LIPITOR) 10 MG tablet Take 1 tablet by mouth daily.    [provider]  ?cholecalciferol (VITAMIN D3) 25 MCG (1000 UNIT) tablet Take 1,000 Units by mouth daily.    [provider]  ?diclofenac (VOLTAREN) 75 MG EC tablet TAKE 1 TABLET BY MOUTH TWICE A DAY 09/05/20   Mcarthur Rossetti, MD  ?Ferrous Sulfate (IRON SLOW RELEASE PO) Take 1 tablet by mouth daily.    [provider]  ?hydrocortisone (CORTEF) 10 MG tablet Take 1 tablet (10 mg total) by mouth as directed. 1.5 tabs every morning and 1 tablet at 4 pm 06/19/21   Shamleffer, Melanie Crazier, MD  ?levothyroxine (SYNTHROID) 50 MCG tablet Take 1 tablet (50 mcg total) by mouth daily. 06/21/21   Shamleffer, Melanie Crazier, MD  ?Multiple Vitamin (MULTI VITAMIN) TABS 1 tablet    [provider]  ?Multiple Vitamins-Minerals (MULTIVITAMIN WITH MINERALS) tablet Take 1 tablet by mouth daily.    [provider]  ?Omega-3 Fatty Acids (FISH OIL) 1000 MG CAPS Take 1,000 mg by mouth daily.    [provider]  ?Testosterone (NATESTO) 5.5 MG/ACT GEL Place 2 Pump into the nose in the morning and at bedtime. 06/29/21   Shamleffer, Melanie Crazier, MD  ?vitamin  E 180 MG (400 UNITS) capsule Take 400 Units by mouth daily.    [provider]  ?  ? ?No family history on file. ? ?Social History  ? ?Socioeconomic History  ? Marital status: Single  ?  Spouse name: Not on file  ? Number of children: 2  ? Years of education: Not on file  ? Highest education level: Not on file  ?Occupational History  ? Not on file  ?Tobacco Use  ? Smoking status: Former  ?  Types: Cigarettes  ?  Quit date: 04/03/2015  ?  Years since quitting: 6.3  ? Smokeless  tobacco: Never  ?Vaping Use  ? Vaping Use: Some days  ? Substances: Nicotine, Flavoring  ?Substance and Sexual Activity  ? Alcohol use: Yes  ?  Comment: occas   ? Drug use: No  ? Sexual activity: Not on file  ?Other Topics Concern  ? Not on file  ?Social History Narrative  ? Not on file  ? ?Social Determinants of Health  ? ?Financial Resource Strain: Not on file  ?Food Insecurity: Not on file  ?Transportation Needs: Not on file  ?Physical Activity: Not on file  ?Stress: Not on file  ?Social Connections: Not on file  ? ? ? ?Review of Systems ? ?Review of Systems: A 12 point ROS discussed and pertinent positives are indicated in the HPI above.  All other systems are negative. ? ?Physical Exam ?No direct physical exam was performed, telephone health visit only today ?Vital Signs: ?There were no vitals taken for this visit. ? ?Imaging: ?MR ABDOMEN WWO CONTRAST ? ?Result Date: 07/24/2021 ?CLINICAL DATA:  Status post left nephrectomy for renal cell carcinoma. Enlarging right renal mass. EXAM: MRI ABDOMEN WITHOUT AND WITH CONTRAST TECHNIQUE: Multiplanar multisequence MR imaging of the abdomen was performed both before and after the administration of intravenous contrast. CONTRAST:  69m GADAVIST GADOBUTROL 1 MMOL/ML IV SOLN COMPARISON:  MRI 12/26/2019 FINDINGS: Lower chest: Right lung base nodule is identified measuring 1.2 cm. This is new when compared with 12/26/2019 but appears unchanged from 05/25/2021. No pleural effusion. Hepatobiliary: Mild hepatic steatosis. No focal enhancing liver lesions. The gallbladder appears normal. No biliary dilatation. Pancreas: Cystic lesion within the neck of pancreas measures 9 mm, image 22/4. This is compared with 6 mm on 12/26/2019. No pancreatic inflammation or main duct dilatation. Spleen:  Within normal limits in size and appearance. Adrenals/Urinary Tract: Status post left nephrectomy and left adrenalectomy. - solid enhancing right kidney lesion within the medial cortex of the  upper pole measures 1.7 x 1.6 cm, image 49/2. This is increased from 0.8 x 0.9 cm on 12/26/2019. -Within the posterior cortex of the upper pole of right kidney there is a new lesion measuring 1.0 x 0.9 cm, image 48/2. This is slightly T2 hypointense, and T1 isointense. This also appears to enhance on the postcontrast images and is also concerning for small renal cell carcinoma. -A third lesion is identified within the posteromedial cortex of the interpolar right kidney measuring 1.3 x 0.9 cm, image 53/24. This contains a focal internal area of enhancing septation which measures 3 mm in thickness, image 54/101. -No additional kidney lesions.  No right-sided hydronephrosis. Stomach/Bowel: Visualized portions within the abdomen are unremarkable. Vascular/Lymphatic: No pathologically enlarged lymph nodes identified. No abdominal aortic aneurysm demonstrated. Other:  None. Musculoskeletal: No suspicious bone lesions identified. IMPRESSION: 1. Mild interval increase in size of previously characterized solid enhancing lesion within the medial cortex of the upper pole of the right  kidney. Suspicious for renal cell carcinoma. 2. There is a new lesion within the posterior cortex of the upper pole of right kidney measuring 1.0 x 0.9 cm. This is also concerning for small renal cell carcinoma. 3. There is a third lesion within the posteromedial cortex of the interpolar right kidney measuring 1.3 x 0.9 cm. This contains a focal area of enhancing septation which measures 3 mm in thickness. This is consistent with a Bosniak class 4F lesion. Follow-up imaging in 6 months is recommended. 4. Status post left nephrectomy and left adrenalectomy. 5. No evidence for metastatic disease within the abdomen. 6. Mild hepatic steatosis. 7. Cystic lesion within the neck of pancreas is slightly increased in size from 6 mm on 12/26/2019. According to consensus criteria follow-up imaging with pancreas protocol MRI in 12 months is advised. This  recommendation follows ACR consensus guidelines: Management of Incidental Pancreatic Cysts: A White Paper of the ACR Incidental Findings Committee. J Am Coll Radiol 2035;59:741-638. 8. Right lung base nodule measur

## 2021-08-03 ENCOUNTER — Inpatient Hospital Stay: Payer: 59 | Admitting: Oncology

## 2021-08-04 ENCOUNTER — Telehealth: Payer: Self-pay | Admitting: Oncology

## 2021-08-04 NOTE — Telephone Encounter (Signed)
.  Called patient to schedule appointment per 5/3 inbasket, patient is aware of date and time.   ?

## 2021-08-09 ENCOUNTER — Encounter: Payer: Self-pay | Admitting: Urology

## 2021-08-09 NOTE — Progress Notes (Signed)
Called patient to make aware of upcoming telephone visit on 05/11 @ 930am. Patient voiced understanding. Patients identity verified.  ? ?Meaningful use questions complete.  ? ?Patient states he feel good, denies any side effects from radiation treatments.  ?

## 2021-08-09 NOTE — Progress Notes (Signed)
Called to request order ? 08/09/21 0918  ?Preop Orders  ?Has preop orders? No  ?Name of staff/physician contacted for orders(Indicate phone or IB message) Maximiano Coss, PA-C  ? ? from surgeon  ?

## 2021-08-10 ENCOUNTER — Ambulatory Visit
Admission: RE | Admit: 2021-08-10 | Discharge: 2021-08-10 | Disposition: A | Discharge status: Home / Self Care | Payer: 59 | Source: Ambulatory Visit | Attending: Urology | Admitting: Urology

## 2021-08-10 DIAGNOSIS — C7801 Secondary malignant neoplasm of right lung: Secondary | ICD-10-CM | POA: Insufficient documentation

## 2021-08-10 DIAGNOSIS — C649 Malignant neoplasm of unspecified kidney, except renal pelvis: Secondary | ICD-10-CM | POA: Insufficient documentation

## 2021-08-10 NOTE — Progress Notes (Signed)
?Radiation Oncology         (336) [phone] ?________________________________ ? ?Name: Marc [name] MRN: [id number]  ?Date: 08/10/2021  DOB: [birth year] ? ?Post Treatment Note ? ?CC: Marc Folk Bud, PA  [place], Marc Dad, MD ? ?Diagnosis:   66 yo [name] with two nodules of metastatic renal cell carcinoma to the right upper lung and right lower lung ? ?Interval Since Last Radiation:  5 weeks  ?06/29/21 - 07/05/21:  The targets in the right upper and lower lobe lung were treated to 50 Gy in 10 fractions of 5 Gy ? ?Narrative:  I spoke with the patient to conduct his routine scheduled 1 month follow up visit via telephone to spare the patient unnecessary potential exposure in the healthcare setting during the current COVID-19 pandemic.  The patient was notified in advance and gave permission to proceed with this visit format. ? ?He tolerated radiation treatment relatively well without any adverse side effects.                             ? ?On review of systems, the patient states that he is doing very well in general and is without complaints.  He specifically denies chest pain, cough, shortness of breath or hemoptysis.  He has not had any dysphagia and reports a healthy appetite, maintaining his weight.  He has not noticed any significant impact on his energy level.  Overall, he is quite pleased with his progress to date.  He is scheduled for cryoablation of the right renal lesions with Dr. Reesa Chew on 08/23/2021. ? ?ALLERGIES:  has No Known Allergies. ? ?Meds: ?Current Outpatient Medications  ?Medication Sig Dispense Refill  ? ascorbic acid (VITAMIN C) 500 MG tablet Take 500 mg by mouth daily.    ? aspirin 81 MG chewable tablet Chew 1 tablet (81 mg total) by mouth 2 (two) times daily. 30 tablet 0  ? atorvastatin (LIPITOR) 10 MG tablet Take 1 tablet by mouth daily.    ? cholecalciferol (VITAMIN D3) 25 MCG (1000 UNIT) tablet Take 1,000 Units by mouth daily.    ? diclofenac (VOLTAREN) 75 MG EC tablet TAKE 1 TABLET BY MOUTH TWICE A  DAY 60 tablet 1  ? Ferrous Sulfate (IRON SLOW RELEASE PO) Take 1 tablet by mouth daily.    ? hydrocortisone (CORTEF) 10 MG tablet Take 1 tablet (10 mg total) by mouth as directed. 1.5 tabs every morning and 1 tablet at 4 pm 75 tablet 1  ? levothyroxine (SYNTHROID) 50 MCG tablet Take 1 tablet (50 mcg total) by mouth daily. 90 tablet 3  ? Multiple Vitamin (MULTI VITAMIN) TABS 1 tablet    ? Multiple Vitamins-Minerals (MULTIVITAMIN WITH MINERALS) tablet Take 1 tablet by mouth daily.    ? Omega-3 Fatty Acids (FISH OIL) 1000 MG CAPS Take 1,000 mg by mouth daily.    ? Testosterone (NATESTO) 5.5 MG/ACT GEL Place 2 Pump into the nose in the morning and at bedtime. 15 g 5  ? vitamin E 180 MG (400 UNITS) capsule Take 400 Units by mouth daily.    ? ?No current facility-administered medications for this encounter.  ? ? ?Physical Findings: ? vitals were not taken for this visit.  ?Pain Assessment ?Pain Score: 0-No pain/10 ?Unable to assess due to telephone follow-up visit format. ? ?Lab Findings: ?Lab Results  ?Component Value Date  ? WBC 17.9 (H) 04/01/2020  ? HGB 12.2 (L) 04/01/2020  ? HCT 36.8 (L) 04/01/2020  ?  MCV 85.6 04/01/2020  ? PLT 380 04/01/2020  ? ? ? ?Radiographic Findings: ?MR ABDOMEN WWO CONTRAST ? ?Result Date: 07/24/2021 ?CLINICAL DATA:  Status post left nephrectomy for renal cell carcinoma. Enlarging right renal mass. EXAM: MRI ABDOMEN WITHOUT AND WITH CONTRAST TECHNIQUE: Multiplanar multisequence MR imaging of the abdomen was performed both before and after the administration of intravenous contrast. CONTRAST:  55m GADAVIST GADOBUTROL 1 MMOL/ML IV SOLN COMPARISON:  MRI 12/26/2019 FINDINGS: Lower chest: Right lung base nodule is identified measuring 1.2 cm. This is new when compared with 12/26/2019 but appears unchanged from 05/25/2021. No pleural effusion. Hepatobiliary: Mild hepatic steatosis. No focal enhancing liver lesions. The gallbladder appears normal. No biliary dilatation. Pancreas: Cystic lesion within  the neck of pancreas measures 9 mm, image 22/4. This is compared with 6 mm on 12/26/2019. No pancreatic inflammation or main duct dilatation. Spleen:  Within normal limits in size and appearance. Adrenals/Urinary Tract: Status post left nephrectomy and left adrenalectomy. - solid enhancing right kidney lesion within the medial cortex of the upper pole measures 1.7 x 1.6 cm, image 49/2. This is increased from 0.8 x 0.9 cm on 12/26/2019. -Within the posterior cortex of the upper pole of right kidney there is a new lesion measuring 1.0 x 0.9 cm, image 48/2. This is slightly T2 hypointense, and T1 isointense. This also appears to enhance on the postcontrast images and is also concerning for small renal cell carcinoma. -A third lesion is identified within the posteromedial cortex of the interpolar right kidney measuring 1.3 x 0.9 cm, image 53/24. This contains a focal internal area of enhancing septation which measures 3 mm in thickness, image 54/101. -No additional kidney lesions.  No right-sided hydronephrosis. Stomach/Bowel: Visualized portions within the abdomen are unremarkable. Vascular/Lymphatic: No pathologically enlarged lymph nodes identified. No abdominal aortic aneurysm demonstrated. Other:  None. Musculoskeletal: No suspicious bone lesions identified. IMPRESSION: 1. Mild interval increase in size of previously characterized solid enhancing lesion within the medial cortex of the upper pole of the right kidney. Suspicious for renal cell carcinoma. 2. There is a new lesion within the posterior cortex of the upper pole of right kidney measuring 1.0 x 0.9 cm. This is also concerning for small renal cell carcinoma. 3. There is a third lesion within the posteromedial cortex of the interpolar right kidney measuring 1.3 x 0.9 cm. This contains a focal area of enhancing septation which measures 3 mm in thickness. This is consistent with a Bosniak class 99F lesion. Follow-up imaging in 6 months is recommended. 4. Status  post left nephrectomy and left adrenalectomy. 5. No evidence for metastatic disease within the abdomen. 6. Mild hepatic steatosis. 7. Cystic lesion within the neck of pancreas is slightly increased in size from 6 mm on 12/26/2019. According to consensus criteria follow-up imaging with pancreas protocol MRI in 12 months is advised. This recommendation follows ACR consensus guidelines: Management of Incidental Pancreatic Cysts: A White Paper of the ACR Incidental Findings Committee. J Am Coll Radiol 23545;62:563-893 8. Right lung base nodule measuring 1.2 cm is new when compared with 12/26/2019 but appears unchanged from 05/25/2021. Electronically Signed   By: TKerby MoorsM.D.   On: 07/24/2021 11:49  ? ?IR Radiologist Eval & Mgmt ? ?Result Date: 07/27/2021 ?Please refer to notes tab for details about interventional procedure. (Op Note) ? ?IR Radiologist Eval & Mgmt ? ?Result Date: 07/14/2021 ?Please refer to notes tab for details about interventional procedure. (Op Note)  ? ?Impression/Plan: ?Marc [name] with two nodules  of metastatic renal cell carcinoma to the right upper lung and right lower lung. ?He has recovered well from the effects of his recent stereotactic body radiotherapy and is without complaints.  He has an appointment scheduled with Dr. Alen Blew on August 31, 2021 for follow-up.  We discussed that while we are happy to continue to participate in his care if clinically indicated, but that at this point, we will plan to see him back on an as-needed basis.  He will continue in routine follow-up under the care and direction of Dr. Alen Blew for continued monitoring and management of his systemic disease.  He is scheduled for cryoablation of the right renal lesions with Dr. Reesa Chew on 08/23/2021.  We enjoyed taking care of him and look forward to continuing to follow his progress via correspondence.  He knows that he is welcome to call at anytime with any questions or concerns related to his previous  radiation. ? ? ?Nicholos Johns, PA-C  ?

## 2021-08-10 NOTE — Progress Notes (Addendum)
  Radiation Oncology         (336) [phone] ________________________________  Name: OMIR COOPRIDER MRN: [id number]  Date: 07/05/2021  DOB: 1955-09-15  End of Treatment Note  Diagnosis:   66 yo man with two nodules of metastatic renal cell carcinoma to the right upper lung and right lower lung  Indication for treatment:  Curative, Definitive SBRT       Radiation treatment dates:   06/29/21 - 07/05/21  Site/dose:   The targets in the right lung were treated to 54 Gy in 3 fractions of 18 Gy  Beams/energy:   The patient was treated using stereotactic body radiotherapy according to a 3D conformal radiotherapy plan.  Volumetric arc fields were employed to deliver 6 MV X-rays.  Image guidance was performed with per fraction cone beam CT prior to treatment under personal MD supervision.  Immobilization was achieved using BodyFix Pillow.  Narrative: The patient tolerated radiation treatment relatively well without any adverse side effects.  Plan: The patient has completed radiation treatment. The patient will return to radiation oncology clinic for routine followup in one month. I advised them to call or return sooner if they have any questions or concerns related to their recovery or treatment. ________________________________  Sheral Apley. Tammi Klippel, M.D.

## 2021-08-14 ENCOUNTER — Other Ambulatory Visit: Payer: Self-pay | Admitting: Internal Medicine

## 2021-08-15 ENCOUNTER — Encounter: Payer: Self-pay | Admitting: Internal Medicine

## 2021-08-15 ENCOUNTER — Ambulatory Visit: Payer: 59 | Admitting: Internal Medicine

## 2021-08-15 VITALS — BP 122/78 | HR 75 | Ht 69.0 in | Wt 237.0 lb

## 2021-08-15 DIAGNOSIS — D352 Benign neoplasm of pituitary gland: Secondary | ICD-10-CM | POA: Diagnosis not present

## 2021-08-15 DIAGNOSIS — E038 Other specified hypothyroidism: Secondary | ICD-10-CM

## 2021-08-15 DIAGNOSIS — R7989 Other specified abnormal findings of blood chemistry: Secondary | ICD-10-CM

## 2021-08-15 LAB — BASIC METABOLIC PANEL
BUN: 21 mg/dL (ref 6–23)
CO2: 25 mEq/L (ref 19–32)
Calcium: 9.1 mg/dL (ref 8.4–10.5)
Chloride: 107 mEq/L (ref 96–112)
Creatinine, Ser: 1.09 mg/dL (ref 0.40–1.50)
GFR: 71.96 mL/min (ref >60.00)
Glucose, Bld: 94 mg/dL (ref 70–99)
Potassium: 4.4 mEq/L (ref 3.5–5.1)
Sodium: 140 mEq/L (ref 135–145)

## 2021-08-15 LAB — T4, FREE: Free T4: 0.67 ng/dL (ref 0.60–1.60)

## 2021-08-15 LAB — TESTOSTERONE: Testosterone: 244.13 ng/dL — ABNORMAL LOW (ref 300.00–890.00)

## 2021-08-15 NOTE — Patient Instructions (Signed)
Please obtain a medical alert bracelet ? ? ? ?ADRENAL INSUFFICIENCY SICK DAY RULES: ? ?Should you face an extreme emotional or physical stress such as trauma, surgery or acute illness, this will require extra steroid coverage so that the body can meet that stress.  ? ?Without increasing the steroid dose you may experience severe weakness, headache, dizziness, nausea and vomiting and possibly a more serious deterioration in health.  ?Typically the dose of steroids will only need to be increased for a couple of days if you have an illness that is transient and managed in the community.  ? ?If you are unable to take/absorb an increased dose of steroids orally because of vomiting or diarrhea, you will urgently require steroid injections and should present to an Emergency Department. ? ?The general advice for any serious illness is as follows: ?Double the normal daily steroid dose for up to 3 days if you have a temperature of more than 37.50C (99.45F) with signs of sickness, or severe emotional or physical distress ?Contact your primary care doctor and Endocrinologist if the illness worsens or it lasts for more than 3 days.  ?In cases of severe illness, urgent medical assistance should be promptly sought. ?If you experience vomiting/diarrhea or are unable to take steroids by mouth, please administer the Hydrocortisone injection kit and seek urgent medical help. ? ?

## 2021-08-15 NOTE — Progress Notes (Addendum)
COVID Vaccine Completed: yes x1 ? ?Date of COVID positive in last 90 days: no ? ?PCP - [name], PA ?Cardiologist - n/a ? ?Chest x-ray - 03/17/21 Epic ?EKG - 08/16/21 Epic/chart ?Stress Test - 7 years ago per pt ?ECHO - n/a ?Cardiac Cath - n/a ?Pacemaker/ICD device last checked: n/a ?Spinal Cord Stimulator: n/a ? ?Bowel Prep - no ? ?Sleep Study - n/a ?CPAP -  ? ?Fasting Blood Sugar - n/a ?Checks Blood Sugar _____ times a day ? ?Blood Thinner Instructions: ?Aspirin Instructions: ASA 81, hold 7 days ?Last Dose: ? ?Activity level: Can go up a flight of stairs and perform activities of daily living without stopping and without symptoms of chest pain or shortness of breath. ?     ? ?Anesthesia review: CAD, anesthesia consult order ? ?Patient denies shortness of breath, fever, cough and chest pain at PAT appointment ? ? ?Patient verbalized understanding of instructions that were given to them at the PAT appointment. Patient was also instructed that they will need to review over the PAT instructions again at home before surgery.  ?

## 2021-08-15 NOTE — Patient Instructions (Addendum)
DUE TO COVID-19 ONLY TWO VISITORS  (aged 64 and older)  ARE ALLOWED TO COME WITH YOU AND STAY IN THE WAITING ROOM ONLY DURING PRE OP AND PROCEDURE.   ?**NO VISITORS ARE ALLOWED IN THE SHORT STAY AREA OR RECOVERY ROOM!!** ? ?IF YOU WILL BE ADMITTED INTO THE HOSPITAL YOU ARE ALLOWED ONLY FOUR SUPPORT PEOPLE DURING VISITATION HOURS ONLY (7 AM -8PM)   ?The support person(s) must pass our screening, gel in and out, and wear a mask at all times, including in the patient?s room. ?Patients must also wear a mask when staff or their support person are in the room. ?Visitors GUEST BADGE MUST BE WORN VISIBLY  ?One adult visitor may remain with you overnight and MUST be in the room by 8 P.M. ?  ? ? Your procedure is scheduled on: 08/23/21 ? ? Report to [facility] Main Entrance ? ?  Report to admitting at 6:15 AM ? ? Call this number if you have problems the morning of surgery [phone] ? ? Do not eat food :After Midnight. ? ? After Midnight you may have the following liquids until 5:30 AM DAY OF SURGERY ? ?Water ?Black Coffee (sugar ok, NO MILK/CREAM OR CREAMERS)  ?Tea (sugar ok, NO MILK/CREAM OR CREAMERS) regular and decaf                             ?Plain Jell-O (NO RED)                                           ?Fruit ices (not with fruit pulp, NO RED)                                     ?Popsicles (NO RED)                                                                  ?Juice: apple, WHITE grape, WHITE cranberry ?Sports drinks like Gatorade (NO RED) ?Clear broth(vegetable,chicken,beef) ? ?FOLLOW BOWEL PREP AND ANY ADDITIONAL PRE OP INSTRUCTIONS YOU RECEIVED FROM YOUR SURGEON'S OFFICE!!! ?  ?  ?Oral Hygiene is also important to reduce your risk of infection.                                    ?Remember - BRUSH YOUR TEETH THE MORNING OF SURGERY WITH YOUR REGULAR TOOTHPASTE ? ? Take these medicines the morning of surgery with A SIP OF WATER: Levothyroxine ?                  ?           You may not have any metal  on your body including jewelry, and body piercing ? ?           Do not wear lotions, powders, cologne, or deodorant ? ?            Men may shave face and neck. ? ? Do  not bring valuables to the hospital. [place] NOT ?            RESPONSIBLE   FOR VALUABLES. ? ? Contacts, dentures or bridgework may not be worn into surgery. ? ? Bring small overnight bag day of surgery. ? ?            Please read over the following fact sheets you were given: IF [place] [phone]- Apolonio Schneiders ? ?   [place] - Preparing for Surgery ?Before surgery, you can play an important role.  Because skin is not sterile, your skin needs to be as free of germs as possible.  You can reduce the number of germs on your skin by washing with CHG (chlorahexidine gluconate) soap before surgery.  CHG is an antiseptic cleaner which kills germs and bonds with the skin to continue killing germs even after washing. ?Please DO NOT use if you have an allergy to CHG or antibacterial soaps.  If your skin becomes reddened/irritated stop using the CHG and inform your nurse when you arrive at Short Stay. ?Do not shave (including legs and underarms) for at least 48 hours prior to the first CHG shower.  You may shave your face/neck. ? ?Please follow these instructions carefully: ? 1.  Shower with CHG Soap the night before surgery and the  morning of surgery. ? 2.  If you choose to wash your hair, wash your hair first as usual with your normal  shampoo. ? 3.  After you shampoo, rinse your hair and body thoroughly to remove the shampoo.                            ? 4.  Use CHG as you would any other liquid soap.  You can apply chg directly to the skin and wash.  Gently with a scrungie or clean washcloth. ? 5.  Apply the CHG Soap to your body ONLY FROM THE NECK DOWN.   Do   not use on face/ open      ?                     Wound or open sores. Avoid contact with eyes, ears mouth and   genitals (private parts).   ?                     Production manager,  Genitals (private parts) with your normal soap. ?            6.  Wash thoroughly, paying special attention to the area where your    surgery  will be performed. ? 7.  Thoroughly rinse your body with warm water from the neck down. ? 8.  DO NOT shower/wash with your normal soap after using and rinsing off the CHG Soap. ?               9.  Pat yourself dry with a clean towel. ?           10.  Wear clean pajamas. ?           11.  Place clean sheets on your bed the night of your first shower and do not  sleep with pets. ?Day of Surgery : ?Do not apply any lotions/deodorants the morning of surgery.  Please wear clean clothes to the hospital/surgery center. ? ?FAILURE TO FOLLOW THESE INSTRUCTIONS MAY  RESULT IN THE CANCELLATION OF YOUR SURGERY ? ?PATIENT SIGNATURE_________________________________ ? ?NURSE SIGNATURE__________________________________ ? ?________________________________________________________________________  ?

## 2021-08-15 NOTE — Progress Notes (Signed)
Name: Marc [name]  MRN/ DOB: [id number], 1955/10/04    Age/ Sex: 66 y.o., male    PCP: [name], PA   Reason for Endocrinology Evaluation: Low testosterone      Date of Initial Endocrinology Evaluation: 06/19/2021    HPI: Marc [name] is a 66 y.o. male with a past medical history of Hx of renal cell carcinoma . The patient presented for initial endocrinology clinic visit on 06/19/2021 for consultative assistance with his Low testosterone .   Marc [name] was referred her for further evaluation of low testosterone and LH this was during work up for low libido. 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 Osker Mason for Oncology  Follows with Dr. Ninfa Linden for back pain and LE weakness   He has never been on testosterone in the past  Had [place] in 01/2021 followed by [place], he felt terrible until he went on prednisone  which has helped   1/5th he saw ortho for leg weakness, PT worsened his condition . Steroid improved  his condition after taking them for  5 days , after that he noted decrease appetite, lost 40 lbs and feeling poorly again. Saw PCP 2/20th started on Prednisone 40 mg for 5 days.  He received another refill 3/3rd and has been taking 10 mg daily    He has no prostate cancer   No CAD  No FH of prostate cancer  He is an ex-smoke  No headaches    He was started on HC with undetectable ACTH < 5 pg/mL and low serum cortisol 0.2ug/dL 06/20/2021  He was started on LT-4 replacement with a FT4 at 0.36 ng/dL , TSH inappropriately normal 06/20/2021   His testosterone was low at 5 ng/dL( [phone]) He was started on Natesto nasal spray, this was cleared by his oncologist as the benefit outweighed the risk   SUBJECTIVE:   Today (08/15/21): Marc [name] is here for follow-up on  hypopituitarism.  Pt has been noted with weight gain  He completed radiation , pending cryoablation  Has noted improved energy level and strength   He has noted visual changes  with loss of peripheral vision , had an exam 2 weeks ago  Has occasional headaches  Denies sore throat or   HOME ENDOCRINE MEDICATIONS: Hydrocortisone 10 mg, 1.5 tabs every morning and 1 tab in the afternoon Levothyroxine 50 mcg daily Natesto 2 pumps 2 times a day      HISTORY:  Past Medical History:  Past Medical History:  Diagnosis Date   Cancer ([place])    Kidney Cancer   Hyperlipidemia    Past Surgical History:  Past Surgical History:  Procedure Laterality Date   IR RADIOLOGIST EVAL & MGMT  07/14/2021   IR RADIOLOGIST EVAL & MGMT  07/27/2021   right shoulder rotator cuff repair Right    ROBOT ASSISTED LAPAROSCOPIC NEPHRECTOMY Left 01/05/2020   Procedure: XI ROBOTIC ASSISTED LAPAROSCOPIC RADICAL NEPHRECTOMY;  Surgeon: Ceasar Mons, MD;  Location: [facility];  Service: Urology;  Laterality: Left;   TOTAL HIP ARTHROPLASTY Left 03/31/2020   Procedure: LEFT TOTAL HIP ARTHROPLASTY ANTERIOR APPROACH;  Surgeon: Mcarthur Rossetti, MD;  Location: [facility];  Service: Orthopedics;  Laterality: Left;    Social History:  reports that he quit smoking  about 6 years ago. His smoking use included cigarettes. He has never used smokeless tobacco. He reports current alcohol use. He reports that he does not use drugs. Family History: family history is not on file.   HOME MEDICATIONS: Allergies as of 08/15/2021   No Known Allergies      Medication List        Accurate as of Aug 15, 2021  1:41 PM. If you have any questions, ask your nurse or doctor.          aspirin EC 81 MG tablet Take 81 mg by mouth every evening. Swallow whole.   atorvastatin 10 MG tablet Commonly known as: LIPITOR Take 10 mg by mouth every evening.   cholecalciferol 25 MCG (1000 UNIT) tablet Commonly known as: VITAMIN  D3 Take 1,000 Units by mouth every evening.   FISH OIL PO Take 1 capsule by mouth every evening.   hydrocortisone 10 MG tablet Commonly known as: CORTEF TAKE 1.5 TABLETS BY MOUTH EVERY MORNING AND 1 TABLET AT 4PM   ibuprofen 200 MG tablet Commonly known as: ADVIL Take 400-600 mg by mouth every 6 (six) hours as needed.   IRON SLOW RELEASE PO Take 1 tablet by mouth every evening.   levothyroxine 50 MCG tablet Commonly known as: SYNTHROID Take 1 tablet (50 mcg total) by mouth daily. What changed: when to take this   multivitamin with minerals tablet Take 1 tablet by mouth every evening.   Natesto 5.5 MG/ACT Gel Generic drug: Testosterone Place 2 Pump into the nose in the morning and at bedtime.   VITAMIN C PO Take 1 tablet by mouth every evening.   vitamin E 180 MG (400 UNITS) capsule Take 400 Units by mouth every evening.          REVIEW OF SYSTEMS: A comprehensive ROS was conducted with the patient and is negative except as per HPI    OBJECTIVE:  VS: BP 122/78 (BP Location: Left Arm, Patient Position: Sitting)   Pulse 75   Ht '5\' 9"'$  (1.753 m)   Wt 237 lb (107.5 kg)   SpO2 96%   BMI 35.00 kg/m    Wt Readings from Last 3 Encounters:  08/15/21 237 lb (107.5 kg)  06/19/21 209 lb (94.8 kg)  06/13/21 210 lb 6 oz (95.4 kg)   Body surface area is 2.29 meters squared.   EXAM: General: Pt appears well and is in NAD  Neck: General: Supple without adenopathy. Thyroid: Thyroid size normal.  No goiter or nodules appreciated.   Chest :  NO glandular breast tissue   Lungs: Clear with good BS bilat with no rales, rhonchi, or wheezes  Heart: Auscultation: RRR.  Abdomen: Normoactive bowel sounds, soft, nontender, without masses or organomegaly palpable  Genital Exam:  Testicular volume 20 mL B/L   Extremities:  BL LE: Tace  pretibial edema  Mental Status: Judgment, insight: Intact Orientation: Oriented to time, place, and person Mood and affect: No depression,  anxiety, or agitation     DATA REVIEWED:     Latest Reference Range & Units 08/16/21 13:17  Sodium 135 - 145 mmol/L 142  Potassium 3.5 - 5.1 mmol/L 4.0  Chloride 98 - 111 mmol/L 110  CO2 22 - 32 mmol/L 26  Glucose 70 - 99 mg/dL 104 (H)  BUN 8 - 23 mg/dL 16  Creatinine 0.61 - 1.24 mg/dL 1.03  Calcium 8.9 - 10.3 mg/dL 8.9  Anion gap 5 - 15  6  GFR, Estimated >60 mL/min >60  Latest Reference Range & Units 08/15/21 14:02  Testosterone 300.00 - 890.00 ng/dL 244.13 (L)      Latest Reference Range & Units 08/15/21 14:02  T4,Free(Direct) 0.60 - 1.60 ng/dL 0.67      Latest Reference Range & Units 06/20/21 08:05  Cortisol, Plasma ug/dL 0.2  LH 1.50 - 9.30 mIU/mL 0.24 (L)  Prolactin 2.0 - 18.0 ng/mL 20.0 (H)  Glucose 70 - 99 mg/dL 88  TSH 0.35 - 5.50 uIU/mL 1.18  T4,Free(Direct) 0.60 - 1.60 ng/dL 0.36 (L)  PSA 0.10 - 4.00 ng/mL 0.00 (L)    Latest Reference Range & Units 06/20/21 08:05  C206 ACTH 6 - 50 pg/mL <5 (L)  Cortisol, Plasma ug/dL 0.2     04/19/2021 Testosterone < 10 ng/dL  LH 0.4 mIU/ml ( 1.7-8.6) Tg 330 HDL 33 LDL 90  H/H 12.5/37.1    Brain MRI 06/08/2021   EXAM: MRI HEAD WITHOUT AND WITH CONTRAST   TECHNIQUE: Multiplanar, multiecho pulse sequences of the brain and surrounding structures were obtained without and with intravenous contrast.   CONTRAST:  3m GADAVIST GADOBUTROL 1 MMOL/ML IV SOLN   COMPARISON:  CT head 07/16/2013   FINDINGS: Brain: There is no evidence of acute intracranial hemorrhage, extra-axial fluid collection, or acute infarct.   Parenchymal volume is normal. The ventricles are normal in size. Scattered small foci of FLAIR signal abnormality in the subcortical and periventricular white matter likely reflects sequela of mild chronic white matter microangiopathy.   There is a homogeneously enhancing lesion in the sella measuring 1.3 cm cc by 1.2 cm AP by 1.0 cm TV. There is associated thickening of the pituitary  infundibulum. The lesion extends into the suprasellar cistern but does not exert mass effect on the optic chiasm. This lesion was not definitely present on the CT head from 07/16/2013, though the sella is suboptimally evaluated due to thick slices.   There is no other abnormal enhancement. There is no mass effect or midline shift.   Vascular: Normal flow voids.   Skull and upper cervical spine: Normal marrow signal.   Sinuses/Orbits: The paranasal sinuses are clear. The globes and orbits are unremarkable.   Other: There is asymmetric atrophy of the right parotid gland.   IMPRESSION: 1. Enhancing lesion in the sella measuring 1.3 cm x 1.2 cm x 1.0 cm demonstrates imaging features which would be in keeping with a pituitary macroadenoma; however, metastatic disease can not be excluded by imaging. Consider follow up in 2-3 months to assess for growth. 2. No other abnormal enhancement.    ASSESSMENT/PLAN/RECOMMENDATIONS:   Pituitary macroadenoma:   -This was found on brain MRI 05/2021 -Pt with new onset visual changes with loss of peripheral vision, pending full evaluation by ophthalmology - Will proceed with repeat MRI given new onset symptoms  - If there's a change , will refer to neurosurgery     2.  Secondary adrenal insufficiency:   -Pt to obtain a medical alert bracelet  - Clinically improving  -Sick day rules discussed with the patient   Medication Continue hydrocortisone 10 mg, 1.5 tabs every morning and 1 tab between 2-4 PM daily   3.  Low testosterone:   -This is secondary in nature due to low LH -Testosterone has been improving  - The decision to start him on testosterone therapy  due to undetectable  testosterone and the benefit outweighed the risk   Continue Natesto 2 sprays BID    4.  Secondary hypothyroidism:   -Free T4  has improved  but its at the low normal - Will increase     Medication Stop levothyroxine 50 mcg daily Start  Levothyroxine 75 mcg daily    Follow-up in 4 months      Signed electronically by: Mack Guise, MD  [facility] Endocrinology  [facility] [place], [place] [address] Phone: [phone] FAX: [phone]   CC: [name], [address] [employer] [address] [place] [address] Phone: [phone] Fax: [phone]   Return to Endocrinology clinic as below: Future Appointments  Date Time Provider [place]  08/16/2021  1:00 PM [facility] PAT 4 [facility] None  08/23/2021  8:30 AM [facility] 1 [facility] [place]  08/31/2021  3:30 PM Shadad, [name], MD [facility] None

## 2021-08-16 ENCOUNTER — Encounter (HOSPITAL_COMMUNITY)
Admission: RE | Admit: 2021-08-16 | Discharge: 2021-08-16 | Disposition: A | Discharge status: Home / Self Care | Payer: 59 | Source: Ambulatory Visit | Attending: Interventional Radiology | Admitting: Interventional Radiology

## 2021-08-16 ENCOUNTER — Encounter (HOSPITAL_COMMUNITY): Payer: Self-pay

## 2021-08-16 VITALS — BP 135/90 | HR 75 | Temp 97.9°F | Resp 16 | Ht 69.0 in | Wt 233.0 lb

## 2021-08-16 DIAGNOSIS — Z01818 Encounter for other preprocedural examination: Secondary | ICD-10-CM | POA: Insufficient documentation

## 2021-08-16 DIAGNOSIS — I2510 Atherosclerotic heart disease of native coronary artery without angina pectoris: Secondary | ICD-10-CM | POA: Diagnosis not present

## 2021-08-16 HISTORY — DX: Hypothyroidism, unspecified: E03.9

## 2021-08-16 HISTORY — DX: Atherosclerotic heart disease of native coronary artery without angina pectoris: I25.10

## 2021-08-16 HISTORY — DX: Unspecified osteoarthritis, unspecified site: M19.90

## 2021-08-16 LAB — CBC
HCT: 38.4 % — ABNORMAL LOW (ref 39.0–52.0)
Hemoglobin: 13 g/dL (ref 13.0–17.0)
MCH: 30.8 pg (ref 26.0–34.0)
MCHC: 33.9 g/dL (ref 30.0–36.0)
MCV: 91 fL (ref 80.0–100.0)
Platelets: 309 10*3/uL (ref 150–400)
RBC: 4.22 MIL/uL (ref 4.22–5.81)
RDW: 14.9 % (ref 11.5–15.5)
WBC: 8 10*3/uL (ref 4.0–10.5)
nRBC: 0 % (ref 0.0–0.2)

## 2021-08-16 LAB — BASIC METABOLIC PANEL
Anion gap: 6 (ref 5–15)
BUN: 16 mg/dL (ref 8–23)
CO2: 26 mmol/L (ref 22–32)
Calcium: 8.9 mg/dL (ref 8.9–10.3)
Chloride: 110 mmol/L (ref 98–111)
Creatinine, Ser: 1.03 mg/dL (ref 0.61–1.24)
GFR, Estimated: >60 mL/min (ref >60)
Glucose, Bld: 104 mg/dL — ABNORMAL HIGH (ref 70–99)
Potassium: 4 mmol/L (ref 3.5–5.1)
Sodium: 142 mmol/L (ref 135–145)

## 2021-08-17 MED ORDER — LEVOTHYROXINE SODIUM 75 MCG PO TABS: 75.0000 ug | ORAL_TABLET | Freq: Every day | ORAL | 3 refills | Status: DC

## 2021-08-18 ENCOUNTER — Ambulatory Visit (HOSPITAL_COMMUNITY): Payer: 59

## 2021-08-22 ENCOUNTER — Other Ambulatory Visit: Payer: Self-pay | Admitting: Internal Medicine

## 2021-08-22 DIAGNOSIS — N2889 Other specified disorders of kidney and ureter: Secondary | ICD-10-CM

## 2021-08-22 NOTE — Anesthesia Preprocedure Evaluation (Addendum)
Anesthesia Evaluation  Patient identified by MRN, date of birth, ID band Patient awake    Reviewed: Allergy & Precautions, NPO status , Patient's Chart, lab work & pertinent test results  History of Anesthesia Complications Negative for: history of anesthetic complications  Airway Mallampati: II  TM Distance: >3 FB Neck ROM: Full    Dental  (+) Teeth Intact, Dental Advisory Given, Implants   Pulmonary neg pulmonary ROS, Patient abstained from smoking., former smoker,    Pulmonary exam normal        Cardiovascular + CAD  Normal cardiovascular exam     Neuro/Psych negative neurological ROS     GI/Hepatic negative GI ROS, Neg liver ROS,   Endo/Other  Hypothyroidism   Renal/GU Renal disease (renal cancer)  negative genitourinary   Musculoskeletal negative musculoskeletal ROS (+)   Abdominal   Peds  Hematology negative hematology ROS (+)   Anesthesia Other Findings   Reproductive/Obstetrics                            Anesthesia Physical Anesthesia Plan  ASA: 3  Anesthesia Plan: General   Post-op Pain Management: Tylenol PO (pre-op)* and Toradol IV (intra-op)*   Induction: Intravenous  PONV Risk Score and Plan: 2 and Ondansetron, Dexamethasone, Treatment may vary due to age or medical condition and Midazolam  Airway Management Planned: Oral ETT  Additional Equipment: None  Intra-op Plan:   Post-operative Plan: Extubation in OR  Informed Consent: I have reviewed the patients History and Physical, chart, labs and discussed the procedure including the risks, benefits and alternatives for the proposed anesthesia with the patient or authorized representative who has indicated his/her understanding and acceptance.     Dental advisory given  Plan Discussed with:   Anesthesia Plan Comments:        Anesthesia Quick Evaluation

## 2021-08-23 ENCOUNTER — Ambulatory Visit (HOSPITAL_COMMUNITY)
Admission: RE | Admit: 2021-08-23 | Discharge: 2021-08-23 | Disposition: A | Discharge status: Home / Self Care | Payer: 59 | Attending: Interventional Radiology | Admitting: Interventional Radiology

## 2021-08-23 ENCOUNTER — Encounter (HOSPITAL_COMMUNITY): Payer: Self-pay | Admitting: Interventional Radiology

## 2021-08-23 ENCOUNTER — Ambulatory Visit (HOSPITAL_COMMUNITY)
Admission: RE | Admit: 2021-08-23 | Discharge: 2021-08-23 | Disposition: A | Discharge status: Home / Self Care | Payer: 59 | Source: Ambulatory Visit | Attending: Oncology | Admitting: Oncology

## 2021-08-23 ENCOUNTER — Ambulatory Visit (HOSPITAL_COMMUNITY): Payer: 59 | Admitting: Physician Assistant

## 2021-08-23 ENCOUNTER — Encounter (HOSPITAL_COMMUNITY)
Admission: RE | Disposition: A | Discharge status: Home / Self Care | Payer: Self-pay | Source: Home / Self Care | Attending: Interventional Radiology

## 2021-08-23 ENCOUNTER — Ambulatory Visit (HOSPITAL_BASED_OUTPATIENT_CLINIC_OR_DEPARTMENT_OTHER): Payer: 59 | Admitting: Registered Nurse

## 2021-08-23 DIAGNOSIS — I2510 Atherosclerotic heart disease of native coronary artery without angina pectoris: Secondary | ICD-10-CM

## 2021-08-23 DIAGNOSIS — C641 Malignant neoplasm of right kidney, except renal pelvis: Secondary | ICD-10-CM | POA: Diagnosis not present

## 2021-08-23 DIAGNOSIS — E039 Hypothyroidism, unspecified: Secondary | ICD-10-CM

## 2021-08-23 DIAGNOSIS — C7801 Secondary malignant neoplasm of right lung: Secondary | ICD-10-CM | POA: Diagnosis not present

## 2021-08-23 DIAGNOSIS — E785 Hyperlipidemia, unspecified: Secondary | ICD-10-CM | POA: Insufficient documentation

## 2021-08-23 DIAGNOSIS — Z905 Acquired absence of kidney: Secondary | ICD-10-CM | POA: Insufficient documentation

## 2021-08-23 DIAGNOSIS — C7802 Secondary malignant neoplasm of left lung: Secondary | ICD-10-CM | POA: Diagnosis not present

## 2021-08-23 DIAGNOSIS — Z85528 Personal history of other malignant neoplasm of kidney: Secondary | ICD-10-CM | POA: Insufficient documentation

## 2021-08-23 DIAGNOSIS — C649 Malignant neoplasm of unspecified kidney, except renal pelvis: Secondary | ICD-10-CM | POA: Diagnosis present

## 2021-08-23 DIAGNOSIS — C642 Malignant neoplasm of left kidney, except renal pelvis: Secondary | ICD-10-CM

## 2021-08-23 DIAGNOSIS — Z87891 Personal history of nicotine dependence: Secondary | ICD-10-CM | POA: Insufficient documentation

## 2021-08-23 DIAGNOSIS — N2889 Other specified disorders of kidney and ureter: Secondary | ICD-10-CM

## 2021-08-23 DIAGNOSIS — N289 Disorder of kidney and ureter, unspecified: Secondary | ICD-10-CM | POA: Diagnosis not present

## 2021-08-23 HISTORY — PX: RADIOFREQUENCY ABLATION: SHX2290

## 2021-08-23 LAB — PROTIME-INR
INR: 1.1 (ref 0.8–1.2)
Prothrombin Time: 14.3 seconds (ref 11.4–15.2)

## 2021-08-23 LAB — TYPE AND SCREEN
ABO/RH(D): A POS
Antibody Screen: NEGATIVE

## 2021-08-23 SURGERY — RADIO FREQUENCY ABLATION
Anesthesia: General

## 2021-08-23 MED ORDER — MIDAZOLAM HCL 2 MG/2ML IJ SOLN
INTRAMUSCULAR | Status: AC
  Filled 2021-08-23: qty 2

## 2021-08-23 MED ORDER — HYDROCODONE-ACETAMINOPHEN 5-325 MG PO TABS: 1.0000 | ORAL_TABLET | Freq: Four times a day (QID) | ORAL | 0 refills | Status: DC | PRN

## 2021-08-23 MED ORDER — SUGAMMADEX SODIUM 200 MG/2ML IV SOLN
INTRAVENOUS | Status: DC | PRN
  Administered 2021-08-23: 100 mg via INTRAVENOUS

## 2021-08-23 MED ORDER — PROPOFOL 10 MG/ML IV BOLUS
INTRAVENOUS | Status: DC | PRN
  Administered 2021-08-23: 130 mg via INTRAVENOUS

## 2021-08-23 MED ORDER — ONDANSETRON HCL 4 MG/2ML IJ SOLN
INTRAMUSCULAR | Status: DC | PRN
Start: 2021-08-23 — End: 2021-08-23
  Administered 2021-08-23: 4 mg via INTRAVENOUS

## 2021-08-23 MED ORDER — MIDAZOLAM HCL 5 MG/5ML IJ SOLN
INTRAMUSCULAR | Status: DC | PRN
  Administered 2021-08-23: 2 mg via INTRAVENOUS

## 2021-08-23 MED ORDER — SODIUM CHLORIDE 0.9 % IV SOLN: INTRAVENOUS | Status: DC

## 2021-08-23 MED ORDER — AMISULPRIDE (ANTIEMETIC) 5 MG/2ML IV SOLN: 10.0000 mg | Freq: Once | INTRAVENOUS | Status: DC | PRN

## 2021-08-23 MED ORDER — CHLORHEXIDINE GLUCONATE 0.12 % MT SOLN
15.0000 mL | Freq: Once | OROMUCOSAL | Status: AC
  Administered 2021-08-23: 15 mL via OROMUCOSAL

## 2021-08-23 MED ORDER — ACETAMINOPHEN 500 MG PO TABS
1000.0000 mg | ORAL_TABLET | Freq: Once | ORAL | Status: AC
  Administered 2021-08-23: 1000 mg via ORAL
  Filled 2021-08-23: qty 2

## 2021-08-23 MED ORDER — LIDOCAINE 2% (20 MG/ML) 5 ML SYRINGE
INTRAMUSCULAR | Status: DC | PRN
  Administered 2021-08-23: 100 mg via INTRAVENOUS

## 2021-08-23 MED ORDER — LACTATED RINGERS IV SOLN: INTRAVENOUS | Status: DC

## 2021-08-23 MED ORDER — ONDANSETRON HCL 4 MG/2ML IJ SOLN: 4.0000 mg | Freq: Once | INTRAMUSCULAR | Status: DC | PRN

## 2021-08-23 MED ORDER — OXYCODONE HCL 5 MG/5ML PO SOLN: 5.0000 mg | Freq: Once | ORAL | Status: DC | PRN

## 2021-08-23 MED ORDER — SODIUM CHLORIDE 0.9 % IV SOLN
2.0000 g | Freq: Once | INTRAVENOUS | Status: DC
  Filled 2021-08-23: qty 2

## 2021-08-23 MED ORDER — PHENYLEPHRINE HCL-NACL 20-0.9 MG/250ML-% IV SOLN
INTRAVENOUS | Status: DC | PRN
  Administered 2021-08-23: 25 ug/min via INTRAVENOUS

## 2021-08-23 MED ORDER — FENTANYL CITRATE (PF) 100 MCG/2ML IJ SOLN
INTRAMUSCULAR | Status: DC | PRN
  Administered 2021-08-23: 100 ug via INTRAVENOUS

## 2021-08-23 MED ORDER — FENTANYL CITRATE (PF) 100 MCG/2ML IJ SOLN
INTRAMUSCULAR | Status: AC
Start: 2021-08-23 — End: ?
  Filled 2021-08-23: qty 2

## 2021-08-23 MED ORDER — OXYCODONE HCL 5 MG PO TABS: 5.0000 mg | ORAL_TABLET | Freq: Once | ORAL | Status: DC | PRN

## 2021-08-23 MED ORDER — SODIUM CHLORIDE 0.9 % IV SOLN: 2.0000 g | INTRAVENOUS | Status: DC

## 2021-08-23 MED ORDER — PHENYLEPHRINE 80 MCG/ML (10ML) SYRINGE FOR IV PUSH (FOR BLOOD PRESSURE SUPPORT)
PREFILLED_SYRINGE | INTRAVENOUS | Status: DC | PRN
  Administered 2021-08-23: 160 ug via INTRAVENOUS

## 2021-08-23 MED ORDER — PHENYLEPHRINE HCL-NACL 20-0.9 MG/250ML-% IV SOLN
INTRAVENOUS | Status: AC
  Filled 2021-08-23: qty 250

## 2021-08-23 MED ORDER — ROCURONIUM BROMIDE 10 MG/ML (PF) SYRINGE
PREFILLED_SYRINGE | INTRAVENOUS | Status: DC | PRN
  Administered 2021-08-23: 30 mg via INTRAVENOUS
  Administered 2021-08-23: 70 mg via INTRAVENOUS

## 2021-08-23 MED ORDER — IOHEXOL 300 MG/ML  SOLN
75.0000 mL | Freq: Once | INTRAMUSCULAR | Status: AC | PRN
  Administered 2021-08-23: 75 mL via INTRAVENOUS

## 2021-08-23 MED ORDER — DEXAMETHASONE SODIUM PHOSPHATE 10 MG/ML IJ SOLN
INTRAMUSCULAR | Status: DC | PRN
  Administered 2021-08-23: 8 mg via INTRAVENOUS

## 2021-08-23 MED ORDER — FENTANYL CITRATE PF 50 MCG/ML IJ SOSY: 25.0000 ug | PREFILLED_SYRINGE | INTRAMUSCULAR | Status: DC | PRN

## 2021-08-23 MED ORDER — ORAL CARE MOUTH RINSE: 15.0000 mL | Freq: Once | OROMUCOSAL | Status: AC

## 2021-08-23 NOTE — Transfer of Care (Signed)
Immediate Anesthesia Transfer of Care Note  Patient: Marc [name]  Procedure(s) Performed: RENAL CRYO ABLATION  Patient Location: PACU  Anesthesia Type:General  Level of Consciousness: awake, alert , oriented and patient cooperative  Airway & Oxygen Therapy: Patient Spontanous Breathing and Patient connected to face mask oxygen  Post-op Assessment: Report given to RN, Post -op Vital signs reviewed and stable and Patient moving all extremities  Post vital signs: Reviewed and stable  Last Vitals:  Vitals Value Taken Time  BP 163/88 08/23/21 1121  Temp    Pulse 84 08/23/21 1123  Resp 16 08/23/21 1123  SpO2 97 % 08/23/21 1123  Vitals shown include unvalidated device data.  Last Pain:  Vitals:   08/23/21 0652  TempSrc: Oral  PainSc:       Patients Stated Pain Goal: 4 (29/47/65 4650)  Complications: No notable events documented.

## 2021-08-23 NOTE — Discharge Instructions (Addendum)
May resume home medications; may resume aspirin on 5/25; stay well-hydrated, avoid strenuous activity for the next 3 to 4 days

## 2021-08-23 NOTE — Anesthesia Postprocedure Evaluation (Signed)
Anesthesia Post Note  Patient: Marc [name]  Procedure(s) Performed: RENAL CRYO ABLATION     Patient location during evaluation: PACU Anesthesia Type: General Level of consciousness: awake and alert Pain management: pain level controlled Vital Signs Assessment: post-procedure vital signs reviewed and stable Respiratory status: spontaneous breathing, nonlabored ventilation and respiratory function stable Cardiovascular status: blood pressure returned to baseline and stable Postop Assessment: no apparent nausea or vomiting Anesthetic complications: no   No notable events documented.  Last Vitals:  Vitals:   08/23/21 1156 08/23/21 1205  BP: 121/79 135/71  Pulse:  77  Resp: 14 12  Temp:    SpO2:  96%    Last Pain:  Vitals:   08/23/21 1145  TempSrc:   PainSc: 0-No pain                 Lidia Collum

## 2021-08-23 NOTE — H&P (Signed)
Referring Physician(s): [id number]  Supervising Physician: Daryll Brod  Patient Status:  [facility]  Chief Complaint: Right renal lesions   Subjective: Patient known to IR service from consultation with Dr. Annamaria Boots on 07/14/2021 and again on 07/27/2021 to discuss treatment options for right renal lesions concerning for neoplasms.  He has a history of metastatic renal cell carcinoma with prior left nephrectomy/adrenalectomy in 2021.  Current imaging reveals increase in size of previously noted solid enhancing lesion within the medial cortex of the upper pole of the right kidney suspicious for renal cell carcinoma as well as a new lesion within the posterior cortex of the upper pole of the right kidney measuring up to 1 cm.  There was a third lesion within the posterior medial cortex of the interpolar right kidney measuring 1.3 cm and containing focal area of enhancing septation which measures 3 mm in thickness (f/u imaging recommended).  There was no evidence of metastatic disease within the abdomen and mild hepatic steatosis.  He also has 2 enlarging right lung pulmonary nodules which have undergone SBRT. Following discussions with Dr. Annamaria Boots patient was deemed an appropriate candidate for image guided cryoablation of right renal lesions and possible biopsy.  He presents today for the procedure.  He currently denies fever, chest pain, dyspnea, abdominal/back pain, nausea, vomiting or bleeding.  He does have occasional headaches, occasional cough.  He is a former smoker.  Additional medical history as below.   Past Medical History:  Diagnosis Date   Arthritis    Cancer [facility])    Kidney Cancer   Coronary artery disease    Hyperlipidemia    Hypothyroidism    Past Surgical History:  Procedure Laterality Date   IR RADIOLOGIST EVAL & MGMT  07/14/2021   IR RADIOLOGIST EVAL & MGMT  07/27/2021   right shoulder rotator cuff repair Right    ROBOT ASSISTED LAPAROSCOPIC NEPHRECTOMY Left 01/05/2020    Procedure: XI ROBOTIC ASSISTED LAPAROSCOPIC RADICAL NEPHRECTOMY;  Surgeon: Ceasar Mons, MD;  [facility];  Service: Urology;  Laterality: Left;   TOTAL HIP ARTHROPLASTY Left 03/31/2020   Procedure: LEFT TOTAL HIP ARTHROPLASTY ANTERIOR APPROACH;  Surgeon: Mcarthur Rossetti, MD;  Location: [place];  Service: Orthopedics;  Laterality: Left;      Allergies: Patient has no known allergies.  Medications: Prior to Admission medications   Medication Sig Start Date End Date Taking? Authorizing Provider  Ascorbic Acid (VITAMIN C PO) Take 1 tablet by mouth every evening.   Yes [provider]  aspirin EC 81 MG tablet Take 81 mg by mouth every evening. Swallow whole.   Yes [provider]  atorvastatin (LIPITOR) 10 MG tablet Take 10 mg by mouth every evening.   Yes [provider]  cholecalciferol (VITAMIN D3) 25 MCG (1000 UNIT) tablet Take 1,000 Units by mouth every evening.   Yes [provider]  Ferrous Sulfate (IRON SLOW RELEASE PO) Take 1 tablet by mouth every evening.   Yes [provider]  hydrocortisone (CORTEF) 10 MG tablet TAKE 1.5 TABLETS BY MOUTH EVERY MORNING AND 1 TABLET AT 4PM 08/14/21  Yes Shamleffer, Melanie Crazier, MD  ibuprofen (ADVIL) 200 MG tablet Take 400-600 mg by mouth every 6 (six) hours as needed.   Yes [provider]  levothyroxine (SYNTHROID) 75 MCG tablet Take 1 tablet (75 mcg total) by mouth daily. 08/17/21  Yes Shamleffer, Melanie Crazier, MD  Multiple Vitamins-Minerals (MULTIVITAMIN WITH MINERALS) tablet Take 1 tablet by mouth every evening.  Yes [provider]  Omega-3 Fatty Acids (FISH OIL PO) Take 1 capsule by mouth every evening.   Yes [provider]  vitamin E 180 MG (400 UNITS) capsule Take 400 Units by mouth every evening.   Yes [provider]  Testosterone (NATESTO) 5.5 MG/ACT GEL Place 2 Pump into the nose in the morning and at bedtime. 06/29/21   Shamleffer,  Melanie Crazier, MD     Vital Signs: BP 139/90   Pulse 93   Temp 98 F (36.7 C) (Oral)   Resp 15   Ht '5\' 9"'$  (1.753 m)   Wt 233 lb (105.7 kg)   SpO2 96%   BMI 34.41 kg/m   Physical Exam awake, alert.  Chest clear to auscultation bilaterally.  Heart with regular rate and rhythm.  Abdomen soft, positive bowel sounds, nontender.  No lower extremity edema.  Imaging: No results found.  Labs:  CBC: Recent Labs    08/16/21 1317  WBC 8.0  HGB 13.0  HCT 38.4*  PLT 309    COAGS: No results for input(s): INR, APTT in the last 8760 hours.  BMP: Recent Labs    06/20/21 0805 08/15/21 1402 08/16/21 1317  NA 140 140 142  K 4.6 4.4 4.0  CL 104 107 110  CO2 '28 25 26  '$ GLUCOSE 88 94 104*  BUN 29* 21 16  CALCIUM 9.2 9.1 8.9  CREATININE 1.11 1.09 1.03  GFRNONAA  --   --  >60    LIVER FUNCTION TESTS: No results for input(s): BILITOT, AST, ALT, ALKPHOS, PROT, ALBUMIN in the last 8760 hours.  Assessment and Plan: Patient known to IR service from consultation with Dr. Annamaria Boots on 07/14/2021 and again on 07/27/2021 to discuss treatment options for right renal lesions concerning for neoplasms.  He has a history of  CAD, HLD, hypothyroidism, and metastatic renal cell carcinoma with prior left nephrectomy/adrenalectomy in 2021.  Current imaging reveals increase in size of previously noted solid enhancing lesion within the medial cortex of the upper pole of the right kidney suspicious for renal cell carcinoma as well as a new lesion within the posterior cortex of the upper pole of the right kidney measuring up to 1 cm.  There was a third lesion within the posterior medial cortex of the interpolar right kidney measuring 1.3 cm and containing focal area of enhancing septation which measures 3 mm in thickness (f/u imaging recommended).  There was no evidence of metastatic disease within the abdomen and mild hepatic steatosis.  He also has 2 enlarging right lung pulmonary nodules which have undergone  SBRT. Following discussions with Dr. Annamaria Boots patient was deemed an appropriate candidate for image guided cryoablation/possible biopsy of right renal lesions and he presents today for the procedure.  Details/risks of procedure, including but not limited to, internal bleeding, infection, injury to adjacent structures, anesthesia related complications discussed with patient with his understanding and consent.   Electronically Signed: D. Rowe Robert, PA-C 08/23/2021, 7:51 AM   I spent a total of 30 minutes at the the patient's bedside AND on the patient's hospital floor or unit, greater than 50% of which was counseling/coordinating care for  image guided cryoablation and possible biopsy of right renal lesions

## 2021-08-23 NOTE — Procedures (Signed)
Interventional Radiology Procedure Note  Procedure: CT CRYO OF 2 RT UPPER POLE RENAL LESIONS    Complications: None  Estimated Blood Loss:  MIN  Findings: SUCCESSFUL CRYO X 2 RT KIDNEY FULL REPORT IN PACS     Tamera Punt, MD

## 2021-08-23 NOTE — Anesthesia Procedure Notes (Signed)
Procedure Name: Intubation Date/Time: 08/23/2021 8:43 AM Performed by: Victoriano Lain, CRNA Pre-anesthesia Checklist: Patient identified, Emergency Drugs available, Suction available, Patient being monitored and Timeout performed Patient Re-evaluated:Patient Re-evaluated prior to induction Oxygen Delivery Method: Circle system utilized Preoxygenation: Pre-oxygenation with 100% oxygen Induction Type: IV induction Ventilation: Mask ventilation without difficulty and Oral airway inserted - appropriate to patient size Laryngoscope Size: Mac and 4 Grade View: Grade I Tube type: Oral Tube size: 7.5 mm Number of attempts: 2 Airway Equipment and Method: Stylet Placement Confirmation: ETT inserted through vocal cords under direct vision, positive ETCO2 and breath sounds checked- equal and bilateral Secured at: 22 cm Tube secured with: Tape Dental Injury: Teeth and Oropharynx as per pre-operative assessment

## 2021-08-23 NOTE — Discharge Summary (Signed)
Patient ID: Marc [name] MRN: [id number] DOB/AGE: 1955/09/30 66 y.o.  Admit date: 08/23/2021 Discharge date: 08/23/2021  Supervising Physician: Daryll Brod  [facility] Status: [facility]  Admission Diagnoses: Metastatic renal cell carcinoma with right renal lesions  Discharge Diagnoses: Metastatic renal cell carcinoma with right renal lesions, status post CT-guided cryoablation of 2 right upper pole renal lesions on 08/23/2021 via general anesthesia; follow-up imaging with trace amount of posterior perinephric hemorrhage and no large hematoma. Active Problems:   * No active hospital problems. *  Past Medical History:  Diagnosis Date   Arthritis    Cancer ([place])    Kidney Cancer   Coronary artery disease    Hyperlipidemia    Hypothyroidism    Past Surgical History:  Procedure Laterality Date   IR RADIOLOGIST EVAL & MGMT  07/14/2021   IR RADIOLOGIST EVAL & MGMT  07/27/2021   right shoulder rotator cuff repair Right    ROBOT ASSISTED LAPAROSCOPIC NEPHRECTOMY Left 01/05/2020   Procedure: XI ROBOTIC ASSISTED LAPAROSCOPIC RADICAL NEPHRECTOMY;  Surgeon: Ceasar Mons, MD;  [facility];  Service: Urology;  Laterality: Left;   TOTAL HIP ARTHROPLASTY Left 03/31/2020   Procedure: LEFT TOTAL HIP ARTHROPLASTY ANTERIOR APPROACH;  Surgeon: Mcarthur Rossetti, MD;  Location: [place];  Service: Orthopedics;  Laterality: Left;     Discharged Condition: good  Hospital Course: [name] is a 66 year old male known to IR service from consultation with Dr. Annamaria Boots on 07/14/2021 and again on 07/27/2021 to discuss treatment options for right renal lesions concerning for neoplasms.  He has a history of metastatic renal cell carcinoma with prior left nephrectomy/adrenalectomy in 2021.  Current imaging reveals increase in size of previously noted solid enhancing lesion within the medial cortex of the upper pole of the right kidney suspicious for renal cell carcinoma as well as a new lesion within  the posterior cortex of the upper pole of the right kidney measuring up to 1 cm.  There was a third lesion within the posterior medial cortex of the interpolar right kidney measuring 1.3 cm and containing focal area of enhancing septation which measures 3 mm in thickness (f/u imaging recommended).  There was no evidence of metastatic disease within the abdomen and mild hepatic steatosis.  He also has 2 enlarging right lung pulmonary nodules which have undergone SBRT. Following discussions with Dr. Annamaria Boots patient was deemed an appropriate candidate for image guided cryoablation of right renal lesions and possible biopsy.  On 08/23/2021 he underwent CT-guided cryoablation of the 2 right upper pole renal lesions via general anesthesia.  Postop imaging revealed trace amount of posterior perinephric hemorrhage but no large hematoma.  Patient was subsequently extubated and observed in PACU for 4 hours without any new complaints.  He denied fever, headache, chest pain, dyspnea, cough, worsening abdominal/back pain, nausea, vomiting or bleeding.  He was able to void and tolerate his diet without difficulty.  Findings were discussed with Dr. Annamaria Boots and he was deemed stable for discharge at this time.  Electronic prescription was called into patient's pharmacy for [place], #20, no refill, 1 to 2 tablets every 6 hours for moderate pain.  He will resume his home medications with exception of aspirin which he can resume on 5/25.  We will follow-up with patient in IR clinic in 1 month.  He is to continue follow-up with Dr. Alen Blew as scheduled.  He was told to contact our service with any additional questions or concerns.  Consults: anesthesia  Significant Diagnostic  Studies:  Results for orders placed or performed during the hospital encounter of 08/23/21  Type and screen  Result Value Ref Range   ABO/RH(D) A POS    Antibody Screen NEG    Sample Expiration      08/26/2021,2359 Performed at [facility] [address], [place], [place] [address]   PT/INR normal, WBC on 5/17 was 8, hemoglobin 13, platelets normal, BMP on 5/17 with normal creatinine, normal potassium, glucose 104   Treatments: CT-guided cryoablation of 2 right upper pole renal lesions via general anesthesia on 08/23/2021  Discharge Exam: Blood pressure 135/71, pulse 77, temperature 97.9 F (36.6 C), resp. rate 12, height '5\' 9"'$  (1.753 m), weight 233 lb (105.7 kg), SpO2 96 %. Awake, alert.  Chest clear to auscultation bilaterally.  Heart with regular rate and rhythm.  Abdomen soft, positive bowel sounds, nontender.  Puncture site right flank clean, dry, nontender, no obvious hematoma.  No lower extremity edema.  Disposition: Discharge disposition: 01-Home or Self Care       Discharge Instructions     Call MD for:  difficulty breathing, headache or visual disturbances   Complete by: As directed    Call MD for:  extreme fatigue   Complete by: As directed    Call MD for:  hives   Complete by: As directed    Call MD for:  persistant dizziness or light-headedness   Complete by: As directed    Call MD for:  persistant nausea and vomiting   Complete by: As directed    Call MD for:  redness, tenderness, or signs of infection (pain, swelling, redness, odor or green/yellow discharge around incision site)   Complete by: As directed    Call MD for:  severe uncontrolled pain   Complete by: As directed    Call MD for:  temperature >100.4   Complete by: As directed    Change dressing (specify)   Complete by: As directed    May change bandage over right flank region and apply Band-Aid to site daily for the next 2 to 3 days.  May wash site with soap and water.   Diet - low sodium heart healthy   Complete by: As directed    Discharge instructions   Complete by: As directed    May resume home medications, may resume aspirin on 5/25; stay well-hydrated; avoid strenuous activity for the next 3 to 4 days   Driving  Restrictions   Complete by: As directed    No driving for the next 24 hours or after taking narcotic medication   Increase activity slowly   Complete by: As directed    Lifting restrictions   Complete by: As directed    No heavy lifting for the next 3 to 4 days      Allergies as of 08/23/2021   No Known Allergies      Medication List     TAKE these medications    aspirin EC 81 MG tablet Take 81 mg by mouth every evening. Swallow whole.   atorvastatin 10 MG tablet Commonly known as: LIPITOR Take 10 mg by mouth every evening.   cholecalciferol 25 MCG (1000 UNIT) tablet Commonly known as: VITAMIN D3 Take 1,000 Units by mouth every evening.   FISH OIL PO Take 1 capsule by mouth every evening.   HYDROcodone-acetaminophen 5-325 MG tablet Commonly known as: Norco Take 1-2 tablets by mouth every 6 (six) hours as needed for moderate pain.   hydrocortisone 10  MG tablet Commonly known as: CORTEF TAKE 1.5 TABLETS BY MOUTH EVERY MORNING AND 1 TABLET AT 4PM   ibuprofen 200 MG tablet Commonly known as: ADVIL Take 400-600 mg by mouth every 6 (six) hours as needed.   IRON SLOW RELEASE PO Take 1 tablet by mouth every evening.   levothyroxine 75 MCG tablet Commonly known as: SYNTHROID Take 1 tablet (75 mcg total) by mouth daily.   multivitamin with minerals tablet Take 1 tablet by mouth every evening.   Nat[place] 5.5 MG/ACT Gel Generic drug: Testosterone Place 2 Pump into the nose in the morning and at bedtime.   VITAMIN C PO Take 1 tablet by mouth every evening.   vitamin E 180 MG (400 UNITS) capsule Take 400 Units by mouth every evening.               Discharge Care Instructions  (From admission, onward)           Start     Ordered   08/23/21 0000  Change dressing (specify)       Comments: May change bandage over right flank region and apply Band-Aid to site daily for the next 2 to 3 days.  May wash site with soap and water.   08/23/21 1532             Follow-up Information     Greggory Keen, MD Follow up.   Specialties: Interventional Radiology, Radiology Why: Dr. Annamaria Boots will follow-up with you in 1 month, likely with phone call or virtual visit; call [phone] or [phone] with any questions Contact information: [address] [place] [address] [phone]         [name], MD Follow up.   Specialty: Oncology Why: Follow-up with Dr. Alen Blew as scheduled Contact information: [place] [address] [phone]                  Electronically Signed: D. Rowe Robert, PA-C 08/23/2021, 3:34 PM   I have spent Less Than 30 Minutes discharging Marc [name].

## 2021-08-24 ENCOUNTER — Encounter (HOSPITAL_COMMUNITY): Payer: Self-pay | Admitting: Interventional Radiology

## 2021-08-25 ENCOUNTER — Ambulatory Visit (HOSPITAL_COMMUNITY): Payer: 59

## 2021-08-25 ENCOUNTER — Telehealth: Payer: Self-pay | Admitting: Internal Medicine

## 2021-08-25 NOTE — Telephone Encounter (Signed)
I got images that patient left with front desk but Dr Cruzita Lederer is unable to see it because it is so blurry. I called patient to inform him we needed better images and also findings from the exam. He will reach out to ophthalmologist and see if they can me faxed.

## 2021-08-25 NOTE — Telephone Encounter (Signed)
Patient came in and dropped of a Field of Vision Results from his eye doctor/surgeon. Per patient eye provider advises he needs immediate surgery.  Patient requesting a call back regarding this matter at [phone]

## 2021-08-30 ENCOUNTER — Telehealth: Payer: Self-pay

## 2021-08-30 NOTE — Telephone Encounter (Signed)
Melissa from [employer] called to see what the status was for the new authorization for MR Brain w wo contrast. She states that she spoke with [facility] and its still pending medical review.  Belenda Cruise will contact [employer] to see what's needed to get the approval.

## 2021-08-31 ENCOUNTER — Inpatient Hospital Stay: Payer: 59 | Attending: Oncology | Admitting: Oncology

## 2021-08-31 VITALS — BP 110/81 | HR 85 | Temp 97.8°F | Resp 18 | Ht 69.0 in | Wt 230.9 lb

## 2021-08-31 DIAGNOSIS — C642 Malignant neoplasm of left kidney, except renal pelvis: Secondary | ICD-10-CM | POA: Diagnosis present

## 2021-08-31 DIAGNOSIS — R918 Other nonspecific abnormal finding of lung field: Secondary | ICD-10-CM | POA: Diagnosis not present

## 2021-08-31 DIAGNOSIS — Z905 Acquired absence of kidney: Secondary | ICD-10-CM | POA: Diagnosis not present

## 2021-08-31 DIAGNOSIS — E236 Other disorders of pituitary gland: Secondary | ICD-10-CM | POA: Diagnosis not present

## 2021-08-31 DIAGNOSIS — Z923 Personal history of irradiation: Secondary | ICD-10-CM | POA: Diagnosis not present

## 2021-08-31 MED ORDER — PROCHLORPERAZINE MALEATE 10 MG PO TABS: 10.0000 mg | ORAL_TABLET | Freq: Four times a day (QID) | ORAL | 0 refills | Status: AC | PRN

## 2021-08-31 NOTE — Progress Notes (Signed)
Hematology and Oncology Follow Up Visit  Marc [name] [id number] Oct 24, 1955 66 y.o. 08/31/2021 3:33 PM Marc [name], Marc [name], [place], [place]   Principle Diagnosis: 66 year old with kidney cancer diagnosed in October 2021.  He was found to have left clear-cell T3a tumor.  He subsequently developed pulmonary involvement with 2 nodules in 2023.  He also had a contralateral renal mass.   Prior Therapy:  He underwent robotic assisted laparoscopic left radical nephrectomy by Dr. Lovena Neighbours on January 05, 2020.  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  The final pathological staging was T3aN0 grade 2 tumor.    He is status post radiation therapy to the pulmonary nodules completed March 2023.  He received 50 Gray in 5 fractions.  He is status post cryoablation to 2 lesions in the right kidney completed on Aug 23, 2021.   Current therapy: Under evaluation for systemic therapy.  Interim History: Marc [name] returns today for a follow-up visit.  Since last visit, he completed radiation therapy to the pulmonary nodules as well as cryoablation for his right kidney.  He denies any complications including abdominal pain, fatigue or flank pain.  He denies any excessive fatigue tiredness.  Denies any bone pain or pathological fractures.     Medications: I have reviewed the patient's current medications.  Current Outpatient Medications  Medication Sig Dispense Refill   Ascorbic Acid (VITAMIN C PO) Take 1 tablet by mouth every evening.     aspirin EC 81 MG tablet Take 81 mg by mouth every evening. Swallow whole.     atorvastatin (LIPITOR) 10 MG tablet Take 10 mg by mouth every evening.     cholecalciferol (VITAMIN D3) 25 MCG (1000 UNIT) tablet Take 1,000 Units by mouth every evening.     Ferrous Sulfate (IRON SLOW RELEASE PO) Take 1 tablet by mouth every evening.      HYDROcodone-acetaminophen (NORCO) 5-325 MG tablet Take 1-2 tablets by mouth every 6 (six) hours as needed for moderate pain. 20 tablet 0   hydrocortisone (CORTEF) 10 MG tablet TAKE 1.5 TABLETS BY MOUTH EVERY MORNING AND 1 TABLET AT 4PM 75 tablet 1   ibuprofen (ADVIL) 200 MG tablet Take 400-600 mg by mouth every 6 (six) hours as needed.     levothyroxine (SYNTHROID) 75 MCG tablet Take 1 tablet (75 mcg total) by mouth daily. 90 tablet 3   Multiple Vitamins-Minerals (MULTIVITAMIN WITH MINERALS) tablet Take 1 tablet by mouth every evening.     Omega-3 Fatty Acids (FISH OIL PO) Take 1 capsule by mouth every evening.     Testosterone (NATESTO) 5.5 MG/ACT GEL Place 2 Pump into the nose in the morning and at bedtime. 15 g 5   vitamin E 180 MG (400 UNITS) capsule Take 400 Units by mouth every evening.     No current facility-administered medications for this visit.     Allergies: No Known Allergies  Past Medical History, Surgical history, Social history, and Family History were reviewed and updated.  Review of Systems:  Remaining ROS negative.  Physical Exam: There were no vitals taken for this visit. ECOG:  General appearance: alert and cooperative appeared without distress. Head: Normocephalic, without obvious abnormality Oropharynx: No oral thrush or ulcers. Eyes: No scleral icterus.  Pupils are equal and round reactive to light. Lymph nodes: Cervical, supraclavicular, and axillary nodes normal. Heart:regular rate and rhythm, S1, S2 normal, no murmur, click, rub  or gallop Lung:chest clear, no wheezing, rales, normal symmetric air entry Abdomin: soft, non-tender, without masses or organomegaly. Neurological: No motor, sensory deficits.  Intact deep tendon reflexes. Skin: No rashes or lesions.  No ecchymosis or petechiae. Musculoskeletal: No joint deformity or effusion. Psychiatric: Mood and affect are appropriate.    Lab Results: Lab Results  Component Value Date   WBC 8.0  08/16/2021   HGB 13.0 08/16/2021   HCT 38.4 (L) 08/16/2021   MCV 91.0 08/16/2021   PLT 309 08/16/2021     Chemistry      Component Value Date/Time   NA 142 08/16/2021 1317   K 4.0 08/16/2021 1317   CL 110 08/16/2021 1317   CO2 26 08/16/2021 1317   BUN 16 08/16/2021 1317   CREATININE 1.03 08/16/2021 1317      Component Value Date/Time   CALCIUM 8.9 08/16/2021 1317   ALKPHOS 103 01/05/2020 0550   AST 22 01/05/2020 0550   ALT 30 01/05/2020 0550   BILITOT 0.9 01/05/2020 0550         Impression and Plan:  66 year old with:  1.  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  He initially was diagnosed in October 2021 with T3AN0 grade 2 clear-cell tumor after radical nephrectomy.     Treatment options moving forward were discussed at this time.  He received ablative radiation therapy completed in March 2023.  The role for additional systemic therapy was discussed.  This will be considered stage IV treated disease and adjuvant immunotherapy would be indicated at this time.  Risks and benefits of Pembrolizumab were discussed at this time.  Complications that include autoimmune issues, GI toxicity among others were reviewed.  The duration of therapy would be for 1 year unless he does not have any progression of disease.  He is agreeable to proceed pending his evaluation for pituitary tumor and will defer the start of immunotherapy after that is completed.  We will update his staging scans as well around that time.   2.  Right renal mass: He is status post cryoablation and we will follow-up on subsequent scans.  3.  Pituitary tumor: Currently under evaluation by endocrinology and possible neurosurgical intervention.   4.  Follow-up: He will return in 2 months for potentially to start of immunotherapy.     30  minutes were spent on this encounter.  The time was dedicated to reviewing laboratory data, disease status update, treatment choices  and future plan of care discussion.    Zola Button, MD 6/1/20233:33 PM

## 2021-08-31 NOTE — Telephone Encounter (Signed)
Patient brought in the notes, images etc from eye doctor. They were placed in Dr [facility] box at the front.  Patient is scheduled for MRI tomorrow night and is waiting for surgical clearance

## 2021-08-31 NOTE — Progress Notes (Signed)
START ON PATHWAY REGIMEN - Renal Cell     A cycle is every 21 days:     Pembrolizumab   **Always confirm dose/schedule in your pharmacy ordering system**  Patient Characteristics: Oligometastatic Disease Therapeutic Status: Oligometastatic Disease Intent of Therapy: Curative Intent, Discussed with Patient

## 2021-09-01 ENCOUNTER — Ambulatory Visit (HOSPITAL_BASED_OUTPATIENT_CLINIC_OR_DEPARTMENT_OTHER): Payer: 59

## 2021-09-01 ENCOUNTER — Encounter (HOSPITAL_BASED_OUTPATIENT_CLINIC_OR_DEPARTMENT_OTHER): Payer: Self-pay

## 2021-09-01 ENCOUNTER — Telehealth: Payer: Self-pay | Admitting: Oncology

## 2021-09-01 NOTE — Telephone Encounter (Signed)
Scheduled per 06/01 los, patient has been called and notified of upcoming appointments.

## 2021-09-01 NOTE — Telephone Encounter (Signed)
Dr Kelton Pillar states she needed opthalmogist notes and images for records. Will send for scanning. She does not do clearance for surgery. Neurosurgeon would need too and she has already sent referral. Patient was contacted and understands.

## 2021-09-04 ENCOUNTER — Encounter: Payer: Self-pay | Admitting: Internal Medicine

## 2021-09-04 ENCOUNTER — Telehealth: Payer: Self-pay | Admitting: Internal Medicine

## 2021-09-04 DIAGNOSIS — D352 Benign neoplasm of pituitary gland: Secondary | ICD-10-CM

## 2021-09-04 NOTE — Telephone Encounter (Signed)
Pt dropped off recent opthalmological exam indicating bitemporal hemianopsia . An urgent referral to neurosurgery has been placed. Awaiting pituitary MRI     Abby Nena Jordan, MD  [facility] Endocrinology  [facility] [address] [place], [place] [address] Phone: [phone] FAX: [phone]

## 2021-09-05 ENCOUNTER — Encounter (HOSPITAL_COMMUNITY): Payer: Self-pay

## 2021-09-05 ENCOUNTER — Ambulatory Visit (HOSPITAL_COMMUNITY): Payer: 59

## 2021-09-06 ENCOUNTER — Other Ambulatory Visit: Payer: Self-pay | Admitting: Nurse Practitioner

## 2021-09-07 ENCOUNTER — Other Ambulatory Visit: Payer: 59

## 2021-09-07 ENCOUNTER — Other Ambulatory Visit: Payer: Self-pay

## 2021-09-07 ENCOUNTER — Inpatient Hospital Stay: Payer: 59

## 2021-09-08 ENCOUNTER — Ambulatory Visit (HOSPITAL_BASED_OUTPATIENT_CLINIC_OR_DEPARTMENT_OTHER)
Admission: RE | Admit: 2021-09-08 | Discharge: 2021-09-08 | Disposition: A | Discharge status: Home / Self Care | Payer: 59 | Source: Ambulatory Visit | Attending: Internal Medicine | Admitting: Internal Medicine

## 2021-09-08 DIAGNOSIS — D352 Benign neoplasm of pituitary gland: Secondary | ICD-10-CM | POA: Diagnosis present

## 2021-09-08 MED ORDER — GADOBUTROL 1 MMOL/ML IV SOLN
10.0000 mL | Freq: Once | INTRAVENOUS | Status: AC | PRN
  Administered 2021-09-08: 10 mL via INTRAVENOUS
  Filled 2021-09-08: qty 10

## 2021-09-11 NOTE — Progress Notes (Signed)
He has known metastatic renal cell ca to the lungs ( s/p SBRT) and new lesions in the other kidney concerning for metastatic disease or contralateral renal cell ca which are now s/p cryoablation.

## 2021-09-27 ENCOUNTER — Telehealth: Payer: Self-pay | Admitting: *Deleted

## 2021-09-27 NOTE — Telephone Encounter (Signed)
Patient's dentist called to ask if patient can have teeth cleaned prior to starting infusion on 8/8 and what type of dental care will be permitted once he starts? Dentist contact info: Dr. Vernard Gambles [phone]    Dr. Alen Blew informed of Dent's question: His response follows:  I have no objections to any dental work before or during treatment.   Contacted Dr. Hardin Negus office, spoke with Kieth Brightly and gave her message per Dr. Alen Blew. She verbalized understanding and states she will inform Dr Hardin Negus.

## 2021-09-29 ENCOUNTER — Ambulatory Visit
Admission: RE | Admit: 2021-09-29 | Discharge: 2021-09-29 | Disposition: A | Discharge status: Home / Self Care | Payer: 59 | Source: Ambulatory Visit | Attending: Radiology | Admitting: Radiology

## 2021-09-29 DIAGNOSIS — N2889 Other specified disorders of kidney and ureter: Secondary | ICD-10-CM

## 2021-09-29 NOTE — Progress Notes (Signed)
Patient ID: Marc [name], male   DOB: Mar 14, 1956, 66 y.o.   MRN: [id number]       Chief Complaint:  Metastatic renal cell carcinoma  Referring Physician(s): Shadad  History of Present Illness: Marc [name] is a 66 y.o. male with metastatic renal cell carcinoma.  He underwent left nephrectomy for a large left renal cell carcinoma in October 2021.  This left renal cell carcinoma measured up to 10 cm.  Pathology confirmed clear-cell RCC.  Active CT surveillance was being performed at [facility] urology.  Most recent CT surveillance February 2023 confirmed 2 enlarging right lung nodules which are now status post SBRT.  He also was found to have 2 suspicious right renal cortical masses in the upper pole 1 medial and 1 posterior.  These lesions are new and have shown slow enlargement.  MRI confirmed that these 2 right renal lesions are solid enhancing and suspicious for renal neoplasm/renal cell carcinoma versus metastatic disease.  Because of these findings he underwent successful CT-guided cryoablation of both right upper pole renal lesions 08/23/2021 at [facility].  Procedure went well without complication.  He was discharged the same day.  Over the last month he has recovered at home very well from a kidney standpoint.  No flank or abdominal pain.  No hematuria dysuria.  No other urinary tract symptoms.  Unfortunately, he has a known enlarging pituitary adenoma which is slowly enlarged by MRI since March 2023 and now has visual field defects.  He has already seen ophthalmology and neurosurgery and is planning to have surgery in the next few weeks.  Oncology follow-up is with Dr. Alen Blew.  Further imaging follow-up will also be ordered by Dr. Alen Blew according to the patient.  Past Medical History:  Diagnosis Date   Arthritis    Cancer [facility])    Kidney Cancer   Coronary artery disease    Hyperlipidemia    Hypothyroidism     Past Surgical History:  Procedure Laterality Date   IR  RADIOLOGIST EVAL & MGMT  07/14/2021   IR RADIOLOGIST EVAL & MGMT  07/27/2021   RADIOFREQUENCY ABLATION N/A 08/23/2021   Procedure: RENAL CRYO ABLATION;  Surgeon: Greggory Keen, MD;  Location: [facility];  Service: Anesthesiology;  Laterality: N/A;   right shoulder rotator cuff repair Right    ROBOT ASSISTED LAPAROSCOPIC NEPHRECTOMY Left 01/05/2020   Procedure: XI ROBOTIC ASSISTED LAPAROSCOPIC RADICAL NEPHRECTOMY;  Surgeon: Ceasar Mons, MD;  Location: [facility];  Service: Urology;  Laterality: Left;   TOTAL HIP ARTHROPLASTY Left 03/31/2020   Procedure: LEFT TOTAL HIP ARTHROPLASTY ANTERIOR APPROACH;  Surgeon: Mcarthur Rossetti, MD;  Location: [facility];  Service: Orthopedics;  Laterality: Left;    Allergies: Patient has no known allergies.  Medications: Prior to Admission medications   Medication Sig Start Date End Date Taking? Authorizing Provider  Ascorbic Acid (VITAMIN C PO) Take 1 tablet by mouth every evening.    [provider]  aspirin EC 81 MG tablet Take 81 mg by mouth every evening. Swallow whole.    [provider]  atorvastatin (LIPITOR) 10 MG tablet Take 10 mg by mouth every evening.    [provider]  cholecalciferol (VITAMIN D3) 25 MCG (1000 UNIT) tablet Take 1,000 Units by mouth every evening.    [provider]  Ferrous Sulfate (IRON SLOW RELEASE PO) Take 1 tablet by mouth every evening.    [provider]  HYDROcodone-acetaminophen (NORCO) 5-325 MG tablet Take 1-2 tablets by mouth every  6 (six) hours as needed for moderate pain. 08/23/21   Allred, Darrell K, PA-C  hydrocortisone (CORTEF) 10 MG tablet TAKE 1.5 TABLETS BY MOUTH EVERY MORNING AND 1 TABLET AT 4PM 08/14/21   Shamleffer, Melanie Crazier, MD  ibuprofen (ADVIL) 200 MG tablet Take 400-600 mg by mouth every 6 (six) hours as needed.    [provider]  levothyroxine (SYNTHROID) 75 MCG tablet Take 1 tablet (75 mcg total) by mouth daily. 08/17/21   Shamleffer,  Melanie Crazier, MD  Multiple Vitamins-Minerals (MULTIVITAMIN WITH MINERALS) tablet Take 1 tablet by mouth every evening.    [provider]  Omega-3 Fatty Acids (FISH OIL PO) Take 1 capsule by mouth every evening.    [provider]  prochlorperazine (COMPAZINE) 10 MG tablet Take 1 tablet (10 mg total) by mouth every 6 (six) hours as needed for nausea or vomiting. 08/31/21   [name], MD  Testosterone (NATESTO) 5.5 MG/ACT GEL Place 2 Pump into the nose in the morning and at bedtime. 06/29/21   Shamleffer, Melanie Crazier, MD  vitamin E 180 MG (400 UNITS) capsule Take 400 Units by mouth every evening.    [provider]     No family history on file.  Social History   Socioeconomic History   Marital status: Single    Spouse name: Not on file   Number of children: 2   Years of education: Not on file   Highest education level: Not on file  Occupational History   Not on file  Tobacco Use   Smoking status: Former    Types: Cigarettes    Quit date: 04/03/2015    Years since quitting: 6.4   Smokeless tobacco: Never  Vaping Use   Vaping Use: Former   Substances: Nicotine, Flavoring  Substance and Sexual Activity   Alcohol use: Yes    Comment: occas    Drug use: No   Sexual activity: Not on file  Other Topics Concern   Not on file  Social History Narrative   Not on file   Social Determinants of Health   Financial Resource Strain: Not on file  Food Insecurity: Not on file  Transportation Needs: Not on file  Physical Activity: Not on file  Stress: Not on file  Social Connections: Not on file    ECOG Status: 1 - Symptomatic but completely ambulatory  Review of Systems  Review of Systems: A 12 point ROS discussed and pertinent positives are indicated in the HPI above.  All other systems are negative.    Physical Exam No direct physical exam was performed telephone health visit only today Vital Signs: There were no vitals taken for this  visit.  Imaging: MR Brain W Wo Contrast  Result Date: 09/11/2021 CLINICAL DATA:  Pituitary dysfunction. Brain/CNS neoplasm, monitor Pt with hypopituitarism , renal cell carcinoma, recent changes in vision and headaches. Pituitary macroadenoma. EXAM: MRI HEAD WITHOUT AND WITH CONTRAST TECHNIQUE: Multiplanar, multiecho pulse sequences of the brain and surrounding structures were obtained without and with intravenous contrast. CONTRAST:  40m GADAVIST GADOBUTROL 1 MMOL/ML IV SOLN COMPARISON:  None Available. FINDINGS: Brain: No acute infarct, mass effect or extra-axial collection. No acute or chronic hemorrhage. There is multifocal hyperintense T2-weighted signal within the white matter. Parenchymal volume and CSF spaces are normal. The midline structures are normal. Pituitary/Sella: Intra sellar/suprasellar mass measures 1.3 cm TV x 2.3 cm CC x 1.3 cm AP. Previous measurements are 1.4 x 1.8 x 1.2 cm. There is up lifting of  the infundibulum. There is slight upward deviation of the optic chiasm with splaying of the optic nerves. Normal cavernous sinus and cavernous internal carotid artery flow voids. Vascular: Major flow voids are preserved. Skull and upper cervical spine: Normal calvarium and skull base. Visualized upper cervical spine and soft tissues are normal. Sinuses/Orbits:No paranasal sinus fluid levels or advanced mucosal thickening. No mastoid or middle ear effusion. Normal orbits. IMPRESSION: 1. Slight increase in size of pituitary macroadenoma, measuring 1.3 x 2.3 x 1.3 cm. Slight upward deviation of the optic chiasm and splaying of the optic nerves has worsened since the prior. 2. No acute intracranial abnormality. Electronically Signed   By: Ulyses Jarred M.D.   On: 09/11/2021 03:41    Labs:  CBC: Recent Labs    08/16/21 1317  WBC 8.0  HGB 13.0  HCT 38.4*  PLT 309    COAGS: Recent Labs    08/23/21 0849  INR 1.1    BMP: Recent Labs    06/20/21 0805 08/15/21 1402 08/16/21 1317   NA 140 140 142  K 4.6 4.4 4.0  CL 104 107 110  CO2 '28 25 26  '$ GLUCOSE 88 94 104*  BUN 29* 21 16  CALCIUM 9.2 9.1 8.9  CREATININE 1.11 1.09 1.03  GFRNONAA  --   --  >60    LIVER FUNCTION TESTS: No results for input(s): "BILITOT", "AST", "ALT", "ALKPHOS", "PROT", "ALBUMIN" in the last 8760 hours.  Assessment and Plan:  1 month status post CT-guided cryoablation of 2 right kidney upper pole solid enhancing lesions compatible with renal neoplasm/renal cell carcinoma versus metastatic disease to the right kidney.  Patient has known metastatic renal cell carcinoma as detailed above.  He is recovered very well over the last month from a kidney standpoint.  No interval surveillance imaging at this early point in his recovery.  Overall he is doing very well.  Plan: Recommend repeat surveillance CT imaging with contrast in 3 months (September 2023).  This is to be scheduled by Dr. Alen Blew who also follows him closely.   Thank you for this interesting consult.  I greatly enjoyed meeting Marc [name] and look forward to participating in their care.  A copy of this report was sent to the requesting provider on this date.  Electronically Signed: Greggory Keen 09/29/2021, 10:53 AM   I spent a total of    25 Minutes in remote  clinical consultation, greater than 50% of which was counseling/coordinating care for this patient with metastatic renal cell carcinoma.    Visit type: Audio only (telephone). Audio (no video) only due to patient's lack of internet/smartphone capability. Alternative for in-person consultation at [facility], [address] [place], [place], [place]. This visit type was conducted due to national recommendations for restrictions regarding the COVID-19 Pandemic (e.g. social distancing).  This format is felt to be most appropriate for this patient at this time.  All issues noted in this document were discussed and addressed.

## 2021-10-10 ENCOUNTER — Other Ambulatory Visit: Payer: Self-pay | Admitting: Internal Medicine

## 2021-10-10 ENCOUNTER — Other Ambulatory Visit: Payer: Self-pay | Admitting: Neurological Surgery

## 2021-10-11 ENCOUNTER — Other Ambulatory Visit: Payer: Self-pay | Admitting: Otolaryngology

## 2021-10-12 ENCOUNTER — Telehealth: Payer: Self-pay | Admitting: *Deleted

## 2021-10-12 ENCOUNTER — Encounter: Payer: Self-pay | Admitting: Internal Medicine

## 2021-10-12 NOTE — Telephone Encounter (Signed)
Returned PC to patient, informed him Dr. Alen Blew wants to reschedule his immunotherapy until after his surgery, he will discuss plans with him at his MD appointment on 11/07/21.  Patient verbalizes understanding.

## 2021-10-12 NOTE — Telephone Encounter (Signed)
-----   Message from [name], MD sent at 10/12/2021  9:20 AM EDT ----- No ----- Message ----- From: Rolene Course, RN Sent: 10/12/2021   9:20 AM EDT To: [name], MD  OK I will cancel his infusion appointment.  Do you still want him to have labs on the 8th?  ----- Message ----- From: [name], MD Sent: 10/12/2021   9:00 AM EDT To: Rolene Course, RN  Yes. He can keep his MD visit to discuss. Thanks ----- Message ----- From: Rolene Course, RN Sent: 10/12/2021   8:58 AM EDT To: [name], MD  Mr Tammen called this morning, he is scheduled to start [facility] on 8/8 and he is also having surgery on his pituitary gland on 8/9.  He is asking if his immunotherapy should be rescheduled.

## 2021-10-20 MED ORDER — TESTOSTERONE 20.25 MG/1.25GM (1.62%) TD GEL: 1.0000 | TRANSDERMAL | 1 refills | Status: DC

## 2021-10-23 ENCOUNTER — Other Ambulatory Visit: Payer: Self-pay

## 2021-10-25 ENCOUNTER — Other Ambulatory Visit (HOSPITAL_COMMUNITY): Payer: Self-pay

## 2021-10-30 NOTE — Pre-Procedure Instructions (Signed)
Surgical Instructions    Your procedure is scheduled on November 08, 2021.  Report to [facility] Main Entrance "A" at 6:30 A.M., then check in with the Admitting office.  Call this number if you have problems the morning of surgery:  [phone]   If you have any questions prior to your surgery date call [phone]: Open Monday-Friday 8am-4pm    Remember:  Do not eat after midnight the night before your surgery  You may drink clear liquids until 5:30 AM the morning of your surgery.   Clear liquids allowed are: Water, Non-Citrus Juices (without pulp), Carbonated Beverages, Clear Tea, Black Coffee Only (NO MILK, CREAM OR POWDERED CREAMER of any kind), and Gatorade.     Take these medicines the morning of surgery with A SIP OF WATER:  hydrocortisone (CORTEF)  levothyroxine (SYNTHROID)  prochlorperazine (COMPAZINE) - take as needed   Follow your surgeon's instructions on when to stop Aspirin.  If no instructions were given by your surgeon then you will need to call the office to get those instructions.     As of today, STOP taking any Aleve, Naproxen, Ibuprofen, Motrin, Advil, Goody's, BC's, all herbal medications, fish oil, and all vitamins.                     Do NOT Smoke (Tobacco/Vaping) for 24 hours prior to your procedure.  If you use a CPAP at night, you may bring your mask/headgear for your overnight stay.   Contacts, glasses, piercing's, hearing aid's, dentures or partials may not be worn into surgery, please bring cases for these belongings.    For patients admitted to the hospital, discharge time will be determined by your treatment team.   Patients discharged the day of surgery will not be allowed to drive home, and someone needs to stay with them for 24 hours.  SURGICAL WAITING ROOM VISITATION Patients having surgery or a procedure may have no more than 2 support people in the waiting area - these visitors may rotate.   Children under the age of 30 must have an adult  with them who is not the patient. If the patient needs to stay at the hospital during part of their recovery, the visitor guidelines for inpatient rooms apply. Pre-op nurse will coordinate an appropriate time for 1 support person to accompany patient in pre-op.  This support person may not rotate.   Please refer to the [facility] website for the visitor guidelines for Inpatients (after your surgery is over and you are in a regular room).    Special instructions:   [facility]- Preparing For Surgery  Before surgery, you can play an important role. Because skin is not sterile, your skin needs to be as free of germs as possible. You can reduce the number of germs on your skin by washing with CHG (chlorahexidine gluconate) Soap before surgery.  CHG is an antiseptic cleaner which kills germs and bonds with the skin to continue killing germs even after washing.    Oral Hygiene is also important to reduce your risk of infection.  Remember - BRUSH YOUR TEETH THE MORNING OF SURGERY WITH YOUR REGULAR TOOTHPASTE  Please do not use if you have an allergy to CHG or antibacterial soaps. If your skin becomes reddened/irritated stop using the CHG.  Do not shave (including legs and underarms) for at least 48 hours prior to first CHG shower. It is OK to shave your face.  Please follow these instructions carefully.   Shower  the NIGHT BEFORE SURGERY and the MORNING OF SURGERY  If you chose to wash your hair, wash your hair first as usual with your normal shampoo.  After you shampoo, rinse your hair and body thoroughly to remove the shampoo.  Use CHG Soap as you would any other liquid soap. You can apply CHG directly to the skin and wash gently with a scrungie or a clean washcloth.   Apply the CHG Soap to your body ONLY FROM THE NECK DOWN.  Do not use on open wounds or open sores. Avoid contact with your eyes, ears, mouth and genitals (private parts). Wash Face and genitals (private parts)  with your normal  soap.   Wash thoroughly, paying special attention to the area where your surgery will be performed.  Thoroughly rinse your body with warm water from the neck down.  DO NOT shower/wash with your normal soap after using and rinsing off the CHG Soap.  Pat yourself dry with a CLEAN TOWEL.  Wear CLEAN PAJAMAS to bed the night before surgery  Place CLEAN SHEETS on your bed the night before your surgery  DO NOT SLEEP WITH PETS.   Day of Surgery: Take a shower with CHG soap. Do not wear jewelry or makeup Do not wear lotions, powders, perfumes/colognes, or deodorant. Do not shave 48 hours prior to surgery.  Men may shave face and neck. Do not bring valuables to the hospital.  [facility] is not responsible for any belongings or valuables. Do not wear nail polish, gel polish, artificial nails, or any other type of covering on natural nails (fingers and toes) If you have artificial nails or gel coating that need to be removed by a nail salon, please have this removed prior to surgery. Artificial nails or gel coating may interfere with anesthesia's ability to adequately monitor your vital signs. Wear Clean/Comfortable clothing the morning of surgery Remember to brush your teeth WITH YOUR REGULAR TOOTHPASTE.   Please read over the following fact sheets that you were given.    If you received a COVID test during your pre-op visit  it is requested that you wear a mask when out in public, stay away from anyone that may not be feeling well and notify your surgeon if you develop symptoms. If you have been in contact with anyone that has tested positive in the last 10 days please notify you surgeon.

## 2021-10-31 ENCOUNTER — Encounter (HOSPITAL_COMMUNITY): Payer: Self-pay | Admitting: Neurological Surgery

## 2021-10-31 ENCOUNTER — Other Ambulatory Visit: Payer: Self-pay

## 2021-10-31 ENCOUNTER — Other Ambulatory Visit: Payer: Self-pay | Admitting: Otolaryngology

## 2021-10-31 ENCOUNTER — Encounter (HOSPITAL_COMMUNITY): Payer: Self-pay

## 2021-10-31 ENCOUNTER — Encounter (HOSPITAL_COMMUNITY)
Admission: RE | Admit: 2021-10-31 | Discharge: 2021-10-31 | Disposition: A | Discharge status: Home / Self Care | Payer: 59 | Source: Ambulatory Visit | Attending: Neurological Surgery | Admitting: Neurological Surgery

## 2021-10-31 DIAGNOSIS — Z01818 Encounter for other preprocedural examination: Secondary | ICD-10-CM

## 2021-10-31 DIAGNOSIS — Z01812 Encounter for preprocedural laboratory examination: Secondary | ICD-10-CM | POA: Insufficient documentation

## 2021-10-31 LAB — CBC
HCT: 38 % — ABNORMAL LOW (ref 39.0–52.0)
Hemoglobin: 12.6 g/dL — ABNORMAL LOW (ref 13.0–17.0)
MCH: 29.6 pg (ref 26.0–34.0)
MCHC: 33.2 g/dL (ref 30.0–36.0)
MCV: 89.2 fL (ref 80.0–100.0)
Platelets: 279 10*3/uL (ref 150–400)
RBC: 4.26 MIL/uL (ref 4.22–5.81)
RDW: 13.2 % (ref 11.5–15.5)
WBC: 9 10*3/uL (ref 4.0–10.5)
nRBC: 0 % (ref 0.0–0.2)

## 2021-10-31 LAB — BASIC METABOLIC PANEL
Anion gap: 4 — ABNORMAL LOW (ref 5–15)
BUN: 14 mg/dL (ref 8–23)
CO2: 24 mmol/L (ref 22–32)
Calcium: 8.7 mg/dL — ABNORMAL LOW (ref 8.9–10.3)
Chloride: 111 mmol/L (ref 98–111)
Creatinine, Ser: 1.2 mg/dL (ref 0.61–1.24)
GFR, Estimated: >60 mL/min (ref >60)
Glucose, Bld: 87 mg/dL (ref 70–99)
Potassium: 4 mmol/L (ref 3.5–5.1)
Sodium: 139 mmol/L (ref 135–145)

## 2021-10-31 NOTE — Pre-Procedure Instructions (Addendum)
Surgical Instructions    Your procedure is scheduled on November 08, 2021.  Report to [facility] Main Entrance "A" at 6:30 A.M., then check in with the Admitting office.  Call this number if you have problems the morning of surgery:  [phone] this is the Pre- surgery desk.   If you have any questions prior to your surgery date call [phone]: Open Monday-Friday 8am-4pm   Remember:  Do not eat after midnight the night before your surgery  You may drink clear liquids until 5:30 AM the morning of your surgery.   Clear liquids allowed are: Water, Non-Citrus Juices (without pulp), Carbonated Beverages, Clear Tea, Black Coffee Only (NO MILK, CREAM OR POWDERED CREAMER of any kind), and Gatorade.     Take these medicines the morning of surgery with A SIP OF WATER:  hydrocortisone (CORTEF)  levothyroxine (SYNTHROID)  prochlorperazine (COMPAZINE) - take as needed   Follow your surgeon's instructions on when to stop Aspirin.  If no instructions were given by your surgeon then you will need to call the office to get those instructions.     As of today, STOP taking any Aleve, Naproxen, Ibuprofen, Motrin, Advil, Goody's, BC's, all herbal medications, fish oil, and all vitamins.                Do NOT Smoke (Tobacco/Vaping) for 24 hours prior to your procedure.  Special instructions:   [place]- Preparing For Surgery  Before surgery, you can play an important role. Because skin is not sterile, your skin needs to be as free of germs as possible. You can reduce the number of germs on your skin by washing with CHG (chlorahexidine gluconate) Soap before surgery.  CHG is an antiseptic cleaner which kills germs and bonds with the skin to continue killing germs even after washing.    Oral Hygiene is also important to reduce your risk of infection.  Remember - BRUSH YOUR TEETH THE MORNING OF SURGERY WITH YOUR REGULAR TOOTHPASTE  Please do not use if you have an allergy to CHG or antibacterial  soaps. If your skin becomes reddened/irritated stop using the CHG.  Do not shave (including legs and underarms) for at least 48 hours prior to first CHG shower. It is OK to shave your face.  Please follow these instructions carefully.   Shower the NIGHT BEFORE SURGERY and the MORNING OF SURGERY  If you chose to wash your hair, wash your hair first as usual with your normal shampoo.  After you shampoo, rinse your hair and body thoroughly to remove the shampoo. Wash your face and private area with your normal soap,then rinse.  CHG Soap as you would any other liquid soap. You can apply CHG directly to the skin and wash gently with a scrungie or a clean washcloth.   Apply the CHG Soap to your body ONLY FROM THE NECK DOWN.  Do not use on open wounds or open sores. Avoid contact with your eyes, ears, mouth and genitals (private parts). Wash Face and genitals (private parts)  with your normal soap.   Wash thoroughly, paying special attention to the area where your surgery will be performed.  Thoroughly rinse your body with warm water from the neck down.  DO NOT shower/wash with your normal soap after using and rinsing off the CHG Soap.  Pat yourself dry with a CLEAN TOWEL.  Wear CLEAN PAJAMAS to bed the night before surgery  Place CLEAN SHEETS on your bed the night before your surgery  DO NOT SLEEP WITH PETS.  Day of Surgery: Take a shower with CHG soap. Do not wear jewelry or makeup Do not wear lotions, powders, perfumes/colognes, or deodorant. Do not shave 48 hours prior to surgery.  Men may shave face and neck. Do not bring valuables to the hospital.  [facility] is not responsible for any belongings or valuables. Do not wear nail polish, gel polish, artificial nails, or any other type of covering on natural nails (fingers and toes) If you have artificial nails or gel coating that need to be removed by a nail salon, please have this removed prior to surgery. Artificial nails or gel  coating may interfere with anesthesia's ability to adequately monitor your vital signs. Wear Clean/Comfortable clothing the morning of surgery Remember to brush your teeth WITH YOUR REGULAR TOOTHPASTE.  Do NOT Smoke (Tobacco/Vaping) for 24 hours prior to your procedure.  If you use a CPAP at night, you may bring your mask/headgear for your overnight stay.   Contacts, glasses, piercing's, hearing aid's, dentures or partials may not be worn into surgery, please bring cases for these belongings.    For patients admitted to the hospital, discharge time will be determined by your treatment team.   Patients discharged the day of surgery will not be allowed to drive home, and someone needs to stay with them for 24 hours.  SURGICAL WAITING ROOM VISITATION Patients having surgery or a procedure may have no more than 2 support people in the waiting area - these visitors may rotate.   Children under the age of 57 must have an adult with them who is not the patient. If the patient needs to stay at the hospital during part of their recovery, the visitor guidelines for inpatient rooms apply. Pre-op nurse will coordinate an appropriate time for 1 support person to accompany patient in pre-op.  This support person may not rotate.   Please refer to the [facility] website for the visitor guidelines for Inpatients (after your surgery is over and you are in a regular room).   Please read over the following fact sheets that you were given.

## 2021-10-31 NOTE — Progress Notes (Signed)
PCP - [name], PA  Cardiologist - none  EP-no  Endocrine-Dr.Ibethal Shamleffer  Pulm-none  Chest x-ray - 03/16/21  EKG - 08/16/21  Stress Test - no  ECHO -   Cardiac Cath - no  Sleep Study - no CPAP - no  LABS-CBC, BMP, T/S  ASA- 81 mg- patient will ask his surgeon  ERAS-yes  HA1C-na Fasting Blood Sugar - na Checks Blood Sugar __0___ times a day  Anesthesia-  Pt denies having chest pain, sob, or fever at this time. All instructions explained to the pt, with a verbal understanding of the material. Pt agrees to go over the instructions while at home for a better understanding. Pt also instructed to self quarantine after being tested for COVID-19. The opportunity to ask questions was provided.

## 2021-11-06 ENCOUNTER — Other Ambulatory Visit: Payer: 59

## 2021-11-06 ENCOUNTER — Ambulatory Visit: Payer: 59

## 2021-11-06 ENCOUNTER — Telehealth: Payer: Self-pay | Admitting: Pharmacy Technician

## 2021-11-06 ENCOUNTER — Other Ambulatory Visit (HOSPITAL_COMMUNITY): Payer: Self-pay

## 2021-11-06 ENCOUNTER — Encounter: Payer: Self-pay | Admitting: Oncology

## 2021-11-06 NOTE — Telephone Encounter (Signed)
Patient Advocate Encounter   Received notification from [facility] that prior authorization for Testosterone 1.62% gel is required/requested.   PA submitted on 11/06/21 to [facility] via [place] Status is pending  Pharmacy Patient Advocate Fax:  [phone]

## 2021-11-07 ENCOUNTER — Encounter (HOSPITAL_COMMUNITY): Payer: Self-pay | Admitting: Neurological Surgery

## 2021-11-07 ENCOUNTER — Other Ambulatory Visit: Payer: Self-pay

## 2021-11-07 ENCOUNTER — Inpatient Hospital Stay: Payer: 59 | Attending: Oncology | Admitting: Oncology

## 2021-11-07 ENCOUNTER — Ambulatory Visit: Payer: 59

## 2021-11-07 VITALS — BP 129/78 | HR 80 | Temp 97.8°F | Resp 17 | Ht 68.0 in | Wt 240.3 lb

## 2021-11-07 DIAGNOSIS — C642 Malignant neoplasm of left kidney, except renal pelvis: Secondary | ICD-10-CM

## 2021-11-07 NOTE — Telephone Encounter (Signed)
Patient Advocate Encounter  Prior Authorization for Testosterone gel has been approved.   Effective: 11/06/21 to 11/07/2022  Clista Bernhardt, CPhT Rx Patient Advocate Specialist Phone: [phone]

## 2021-11-07 NOTE — Anesthesia Preprocedure Evaluation (Signed)
Anesthesia Evaluation  Patient identified by MRN, date of birth, ID band Patient awake    Reviewed: Allergy & Precautions, NPO status , Patient's Chart, lab work & pertinent test results  History of Anesthesia Complications Negative for: history of anesthetic complications  Airway Mallampati: II  TM Distance: >3 FB Neck ROM: Full    Dental  (+) Dental Advisory Given   Pulmonary former smoker,  H/o pulm nodules: XRT   breath sounds clear to auscultation       Cardiovascular negative cardio ROS   Rhythm:Regular Rate:Normal     Neuro/Psych Pituitary mass    GI/Hepatic negative GI ROS, Neg liver ROS,   Endo/Other  Hypothyroidism Morbid obesityPanhypopit: hydrocortisone, thyroid hormone  Renal/GU Renal cell carcinoma     Musculoskeletal  (+) Arthritis ,   Abdominal (+) + obese,   Peds  Hematology negative hematology ROS (+)   Anesthesia Other Findings   Reproductive/Obstetrics                            Anesthesia Physical Anesthesia Plan  ASA: 3  Anesthesia Plan: General   Post-op Pain Management: Tylenol PO (pre-op)*   Induction: Intravenous  PONV Risk Score and Plan: 2 and Ondansetron and Dexamethasone  Airway Management Planned: Oral ETT  Additional Equipment: Arterial line  Intra-op Plan:   Post-operative Plan: Extubation in OR  Informed Consent: I have reviewed the patients History and Physical, chart, labs and discussed the procedure including the risks, benefits and alternatives for the proposed anesthesia with the patient or authorized representative who has indicated his/her understanding and acceptance.     Dental advisory given  Plan Discussed with: CRNA and Surgeon  Anesthesia Plan Comments:        Anesthesia Quick Evaluation

## 2021-11-07 NOTE — Progress Notes (Signed)
Hematology and Oncology Follow Up Visit  Marc [name] [id number] 12-01-1955 66 y.o. 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  He was found to have left clear-cell T3a tumor and 2 pulmonary nodules in 2023.    Prior Therapy:  He underwent robotic assisted laparoscopic left radical nephrectomy by Dr. Lovena Neighbours on January 05, 2020.  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  The final pathological staging was T3aN0 grade 2 tumor.    He is status post radiation therapy to the pulmonary nodules completed March 2023.  He received 50 Gray in 5 fractions.  He is status post cryoablation to 2 lesions in the right kidney completed on Aug 23, 2021.   Current therapy: Under evaluation for systemic therapy.  Interim History: [name] presents today for return evaluation.  Since the last visit, he reports no major changes in his health.  He denies any headaches or blurry vision neurological deficits.  He denies any recent hospitalizations or illnesses.     Medications: Updated on review. Current Outpatient Medications  Medication Sig Dispense Refill   Ascorbic Acid (VITAMIN C PO) Take 1 tablet by mouth every evening.     aspirin EC 81 MG tablet Take 81 mg by mouth every evening. Swallow whole.     atorvastatin (LIPITOR) 10 MG tablet Take 10 mg by mouth every evening.     cholecalciferol (VITAMIN D3) 25 MCG (1000 UNIT) tablet Take 1,000 Units by mouth every evening.     Ferrous Sulfate (IRON SLOW RELEASE PO) Take 1 tablet by mouth every evening.     HYDROcodone-acetaminophen (NORCO) 5-325 MG tablet Take 1-2 tablets by mouth every 6 (six) hours as needed for moderate pain. (Patient not taking: Reported on 10/24/2021) 20 tablet 0   hydrocortisone  (CORTEF) 10 MG tablet TAKE 1& 1/2 TABLET BY MOUTH EVERY MORNING AND TAKE 1 TABLET AT 4:00 FOR PAIN.M. 75 tablet 1   ibuprofen (ADVIL) 200 MG tablet Take 400-600 mg by mouth every 6 (six) hours as needed for moderate pain.     levothyroxine (SYNTHROID) 75 MCG tablet Take 1 tablet (75 mcg total) by mouth daily. 90 tablet 3   Multiple Vitamins-Minerals (MULTIVITAMIN WITH MINERALS) tablet Take 1 tablet by mouth every evening.     Omega-3 Fatty Acids (FISH OIL PO) Take 1 capsule by mouth every evening.     prochlorperazine (COMPAZINE) 10 MG tablet Take 1 tablet (10 mg total) by mouth every 6 (six) hours as needed for nausea or vomiting. 30 tablet 0   Testosterone (ANDROGEL) 20.25 MG/1.25GM (1.62%) GEL Place 1 Pump onto the skin as directed. 1 pump to each shoulder daily 75 g 1   vitamin E 180 MG (400 UNITS) capsule Take 400 Units by mouth every evening.     No current facility-administered medications for this visit.     Allergies: No Known Allergies    Physical Exam: Blood pressure 129/78, pulse 80, temperature 97.8 F (36.6 C), temperature source Temporal, resp. rate 17, height '5\' 8"'$  (1.727 m), weight 240 lb 4.8 oz (109 kg), SpO2 97 %.  ECOG: 1    General appearance: Alert, awake without any distress. Head: Atraumatic without abnormalities Oropharynx: Without any thrush or ulcers. Eyes: No scleral icterus. Lymph nodes: No lymphadenopathy noted in the cervical,  supraclavicular, or axillary nodes Heart:regular rate and rhythm, without any murmurs or gallops.   Lung: Clear to auscultation without any rhonchi, wheezes or dullness to percussion. Abdomin: Soft, nontender without any shifting dullness or ascites. Musculoskeletal: No clubbing or cyanosis. Neurological: No motor or sensory deficits. Skin: No rashes or lesions.    Lab Results: Lab Results  Component Value Date   WBC 9.0 10/31/2021   HGB 12.6 (L) 10/31/2021   HCT 38.0 (L) 10/31/2021   MCV 89.2 10/31/2021   PLT 279  10/31/2021   PSA 0.00 (L) 06/20/2021     Chemistry      Component Value Date/Time   NA 139 10/31/2021 1121   K 4.0 10/31/2021 1121   CL 111 10/31/2021 1121   CO2 24 10/31/2021 1121   BUN 14 10/31/2021 1121   CREATININE 1.20 10/31/2021 1121      Component Value Date/Time   CALCIUM 8.7 (L) 10/31/2021 1121   ALKPHOS 103 01/05/2020 0550   AST 22 01/05/2020 0550   ALT 30 01/05/2020 0550   BILITOT 0.9 01/05/2020 0550         Impression and Plan:  66 year old with:  1.  Stage IV clear-cell renal cell carcinoma with isolated pulmonary nodules diagnosed in February 2023.  He has no active disease at this time.     Risks and benefits of proceeding with Pembrolizumab were discussed at this time.  Complications include nausea, fatigue and autoimmune complications were reiterated.  Plan is to complete 1 year of therapy with every 3-week schedule.   2.  Right renal mass: No evidence of relapse noted at this time these will be monitored in the future.  3.  Pituitary tumor: He is scheduled to have surgery in the near future.  4.  Autoimmune complications: These include pneumonitis, colitis, thyroid disease.  This will be monitored closely he was educated this.   5.  Follow-up: In 4 weeks to start therapy.   30  minutes were spent on this encounter.  The time was dedicated to reviewing laboratory data, disease status update, and future plan of care discussion.    Zola Button, MD 8/8/20238:12 AM

## 2021-11-08 ENCOUNTER — Inpatient Hospital Stay (HOSPITAL_COMMUNITY): Payer: 59

## 2021-11-08 ENCOUNTER — Other Ambulatory Visit: Payer: Self-pay

## 2021-11-08 ENCOUNTER — Encounter (HOSPITAL_COMMUNITY): Payer: Self-pay | Admitting: Neurological Surgery

## 2021-11-08 ENCOUNTER — Inpatient Hospital Stay (HOSPITAL_COMMUNITY)
Admission: RE | Disposition: A | Discharge status: Home / Self Care | Payer: Self-pay | Source: Home / Self Care | Attending: Neurological Surgery

## 2021-11-08 ENCOUNTER — Inpatient Hospital Stay (HOSPITAL_COMMUNITY)
Admission: RE | Admit: 2021-11-08 | Discharge: 2021-11-11 | DRG: 829 | Disposition: A | Discharge status: Home / Self Care | Payer: 59 | Attending: Neurological Surgery | Admitting: Neurological Surgery

## 2021-11-08 DIAGNOSIS — E230 Hypopituitarism: Secondary | ICD-10-CM | POA: Diagnosis present

## 2021-11-08 DIAGNOSIS — Z87891 Personal history of nicotine dependence: Secondary | ICD-10-CM | POA: Diagnosis not present

## 2021-11-08 DIAGNOSIS — E039 Hypothyroidism, unspecified: Secondary | ICD-10-CM

## 2021-11-08 DIAGNOSIS — Z01818 Encounter for other preprocedural examination: Principal | ICD-10-CM

## 2021-11-08 DIAGNOSIS — H5347 Heteronymous bilateral field defects: Secondary | ICD-10-CM | POA: Diagnosis present

## 2021-11-08 DIAGNOSIS — Z85528 Personal history of other malignant neoplasm of kidney: Secondary | ICD-10-CM | POA: Diagnosis not present

## 2021-11-08 DIAGNOSIS — D352 Benign neoplasm of pituitary gland: Secondary | ICD-10-CM

## 2021-11-08 DIAGNOSIS — I700 Atherosclerosis of aorta: Secondary | ICD-10-CM

## 2021-11-08 DIAGNOSIS — C7931 Secondary malignant neoplasm of brain: Secondary | ICD-10-CM | POA: Diagnosis present

## 2021-11-08 DIAGNOSIS — C7989 Secondary malignant neoplasm of other specified sites: Principal | ICD-10-CM | POA: Diagnosis present

## 2021-11-08 DIAGNOSIS — E893 Postprocedural hypopituitarism: Secondary | ICD-10-CM

## 2021-11-08 DIAGNOSIS — J342 Deviated nasal septum: Secondary | ICD-10-CM | POA: Diagnosis present

## 2021-11-08 DIAGNOSIS — E237 Disorder of pituitary gland, unspecified: Secondary | ICD-10-CM | POA: Diagnosis present

## 2021-11-08 DIAGNOSIS — Z6836 Body mass index (BMI) 36.0-36.9, adult: Secondary | ICD-10-CM | POA: Diagnosis not present

## 2021-11-08 DIAGNOSIS — E785 Hyperlipidemia, unspecified: Secondary | ICD-10-CM | POA: Diagnosis present

## 2021-11-08 HISTORY — PX: TRANSPHENOIDAL APPROACH EXPOSURE: SHX6311

## 2021-11-08 HISTORY — PX: CRANIOTOMY: SHX93

## 2021-11-08 LAB — RENAL FUNCTION PANEL
Albumin: 3.4 g/dL — ABNORMAL LOW (ref 3.5–5.0)
Albumin: 3.3 g/dL — ABNORMAL LOW (ref 3.5–5.0)
Anion gap: 8 (ref 5–15)
Anion gap: 10 (ref 5–15)
BUN: 12 mg/dL (ref 8–23)
BUN: 11 mg/dL (ref 8–23)
CO2: 21 mmol/L — ABNORMAL LOW (ref 22–32)
CO2: 21 mmol/L — ABNORMAL LOW (ref 22–32)
Calcium: 8.4 mg/dL — ABNORMAL LOW (ref 8.9–10.3)
Calcium: 8.9 mg/dL (ref 8.9–10.3)
Chloride: 108 mmol/L (ref 98–111)
Chloride: 107 mmol/L (ref 98–111)
Creatinine, Ser: 1.18 mg/dL (ref 0.61–1.24)
Creatinine, Ser: 1.22 mg/dL (ref 0.61–1.24)
GFR, Estimated: >60 mL/min (ref >60)
GFR, Estimated: >60 mL/min (ref >60)
Glucose, Bld: 246 mg/dL — ABNORMAL HIGH (ref 70–99)
Glucose, Bld: 251 mg/dL — ABNORMAL HIGH (ref 70–99)
Phosphorus: 2.4 mg/dL — ABNORMAL LOW (ref 2.5–4.6)
Phosphorus: 1.8 mg/dL — ABNORMAL LOW (ref 2.5–4.6)
Potassium: 4 mmol/L (ref 3.5–5.1)
Potassium: 4 mmol/L (ref 3.5–5.1)
Sodium: 137 mmol/L (ref 135–145)
Sodium: 138 mmol/L (ref 135–145)

## 2021-11-08 LAB — POCT I-STAT 7, (LYTES, BLD GAS, ICA,H+H)
Acid-base deficit: 3 mmol/L — ABNORMAL HIGH (ref 0.0–2.0)
Bicarbonate: 22.7 mmol/L (ref 20.0–28.0)
Calcium, Ion: 1.23 mmol/L (ref 1.15–1.40)
HCT: 30 % — ABNORMAL LOW (ref 39.0–52.0)
Hemoglobin: 10.2 g/dL — ABNORMAL LOW (ref 13.0–17.0)
O2 Saturation: 100 %
Patient temperature: 36.4
Potassium: 4.4 mmol/L (ref 3.5–5.1)
Sodium: 139 mmol/L (ref 135–145)
TCO2: 24 mmol/L (ref 22–32)
pCO2 arterial: 39.8 mmHg (ref 32–48)
pH, Arterial: 7.361 (ref 7.35–7.45)
pO2, Arterial: 188 mmHg — ABNORMAL HIGH (ref 83–108)

## 2021-11-08 LAB — PREPARE RBC (CROSSMATCH)

## 2021-11-08 SURGERY — CRANIOTOMY HYPOPHYSECTOMY TRANSNASAL APPROACH
Anesthesia: General

## 2021-11-08 MED ORDER — ACETAMINOPHEN 650 MG RE SUPP: 650.0000 mg | RECTAL | Status: DC | PRN

## 2021-11-08 MED ORDER — ONDANSETRON HCL 4 MG/2ML IJ SOLN
4.0000 mg | INTRAMUSCULAR | Status: DC | PRN
  Administered 2021-11-10: 4 mg via INTRAVENOUS
  Filled 2021-11-08: qty 2

## 2021-11-08 MED ORDER — ROCURONIUM BROMIDE 10 MG/ML (PF) SYRINGE
PREFILLED_SYRINGE | INTRAVENOUS | Status: AC
  Filled 2021-11-08: qty 10

## 2021-11-08 MED ORDER — CHLORHEXIDINE GLUCONATE CLOTH 2 % EX PADS
6.0000 | MEDICATED_PAD | Freq: Every day | CUTANEOUS | Status: DC
  Administered 2021-11-09 – 2021-11-10 (×2): 6 via TOPICAL

## 2021-11-08 MED ORDER — PROPOFOL 10 MG/ML IV BOLUS
INTRAVENOUS | Status: AC
  Filled 2021-11-08: qty 20

## 2021-11-08 MED ORDER — ALBUMIN HUMAN 5 % IV SOLN: INTRAVENOUS | Status: DC | PRN

## 2021-11-08 MED ORDER — FENTANYL CITRATE (PF) 250 MCG/5ML IJ SOLN
INTRAMUSCULAR | Status: AC
  Filled 2021-11-08: qty 5

## 2021-11-08 MED ORDER — 0.9 % SODIUM CHLORIDE (POUR BTL) OPTIME
TOPICAL | Status: DC | PRN
  Administered 2021-11-08: 1000 mL

## 2021-11-08 MED ORDER — PROMETHAZINE HCL 25 MG PO TABS: 12.5000 mg | ORAL_TABLET | ORAL | Status: DC | PRN

## 2021-11-08 MED ORDER — HYDROCODONE-ACETAMINOPHEN 5-325 MG PO TABS
1.0000 | ORAL_TABLET | ORAL | Status: DC | PRN
  Administered 2021-11-08 – 2021-11-10 (×5): 1 via ORAL
  Filled 2021-11-08 (×5): qty 1

## 2021-11-08 MED ORDER — LEVOTHYROXINE SODIUM 75 MCG PO TABS
75.0000 ug | ORAL_TABLET | Freq: Every day | ORAL | Status: DC
  Administered 2021-11-09 – 2021-11-11 (×3): 75 ug via ORAL
  Filled 2021-11-08 (×3): qty 1

## 2021-11-08 MED ORDER — THROMBIN 5000 UNITS EX SOLR
CUTANEOUS | Status: AC
  Filled 2021-11-08: qty 10000

## 2021-11-08 MED ORDER — CHLORHEXIDINE GLUCONATE CLOTH 2 % EX PADS: 6.0000 | MEDICATED_PAD | Freq: Once | CUTANEOUS | Status: DC

## 2021-11-08 MED ORDER — OXYMETAZOLINE HCL 0.05 % NA SOLN
NASAL | Status: AC
  Filled 2021-11-08: qty 30

## 2021-11-08 MED ORDER — ONDANSETRON HCL 4 MG/2ML IJ SOLN
INTRAMUSCULAR | Status: AC
  Filled 2021-11-08: qty 2

## 2021-11-08 MED ORDER — ORAL CARE MOUTH RINSE
15.0000 mL | OROMUCOSAL | Status: DC
  Administered 2021-11-08 – 2021-11-10 (×7): 15 mL via OROMUCOSAL

## 2021-11-08 MED ORDER — OXYMETAZOLINE HCL 0.05 % NA SOLN
NASAL | Status: DC | PRN
  Administered 2021-11-08: 1

## 2021-11-08 MED ORDER — IBUPROFEN 200 MG PO TABS: 400.0000 mg | ORAL_TABLET | Freq: Four times a day (QID) | ORAL | Status: DC | PRN

## 2021-11-08 MED ORDER — CHLORHEXIDINE GLUCONATE 0.12 % MT SOLN
15.0000 mL | Freq: Once | OROMUCOSAL | Status: AC
  Administered 2021-11-08: 15 mL via OROMUCOSAL
  Filled 2021-11-08: qty 15

## 2021-11-08 MED ORDER — FENTANYL CITRATE (PF) 250 MCG/5ML IJ SOLN
INTRAMUSCULAR | Status: DC | PRN
  Administered 2021-11-08: 100 ug via INTRAVENOUS

## 2021-11-08 MED ORDER — ACETAMINOPHEN 325 MG PO TABS
650.0000 mg | ORAL_TABLET | ORAL | Status: DC | PRN
  Administered 2021-11-10: 650 mg via ORAL
  Filled 2021-11-08: qty 2

## 2021-11-08 MED ORDER — SODIUM CHLORIDE 0.9 % IR SOLN
Status: DC | PRN
  Administered 2021-11-08: 2000 mL

## 2021-11-08 MED ORDER — ONDANSETRON HCL 4 MG/2ML IJ SOLN
INTRAMUSCULAR | Status: DC | PRN
  Administered 2021-11-08: 4 mg via INTRAVENOUS

## 2021-11-08 MED ORDER — HEPARIN SODIUM (PORCINE) 5000 UNIT/ML IJ SOLN
5000.0000 [IU] | Freq: Three times a day (TID) | INTRAMUSCULAR | Status: DC
  Administered 2021-11-10 – 2021-11-11 (×5): 5000 [IU] via SUBCUTANEOUS
  Filled 2021-11-08 (×5): qty 1

## 2021-11-08 MED ORDER — CEPHALEXIN 500 MG PO CAPS
500.0000 mg | ORAL_CAPSULE | Freq: Three times a day (TID) | ORAL | Status: DC
  Administered 2021-11-09 – 2021-11-11 (×7): 500 mg via ORAL
  Filled 2021-11-08 (×9): qty 1

## 2021-11-08 MED ORDER — ONDANSETRON HCL 4 MG PO TABS: 4.0000 mg | ORAL_TABLET | ORAL | Status: DC | PRN

## 2021-11-08 MED ORDER — ARTIFICIAL TEARS OPHTHALMIC OINT
TOPICAL_OINTMENT | OPHTHALMIC | Status: DC | PRN
  Administered 2021-11-08: 1 via OPHTHALMIC

## 2021-11-08 MED ORDER — LABETALOL HCL 5 MG/ML IV SOLN
10.0000 mg | INTRAVENOUS | Status: DC | PRN
  Administered 2021-11-08: 10 mg via INTRAVENOUS
  Filled 2021-11-08: qty 4

## 2021-11-08 MED ORDER — CEPHALEXIN 500 MG PO CAPS
500.0000 mg | ORAL_CAPSULE | Freq: Three times a day (TID) | ORAL | 0 refills | Status: AC
Start: 2021-11-08 — End: 2021-11-15

## 2021-11-08 MED ORDER — HYDROCORTISONE 10 MG PO TABS
10.0000 mg | ORAL_TABLET | Freq: Every day | ORAL | Status: DC
  Administered 2021-11-08 – 2021-11-10 (×3): 10 mg via ORAL
  Filled 2021-11-08 (×6): qty 1

## 2021-11-08 MED ORDER — SUGAMMADEX SODIUM 200 MG/2ML IV SOLN
INTRAVENOUS | Status: DC | PRN
  Administered 2021-11-08: 250 mg via INTRAVENOUS

## 2021-11-08 MED ORDER — ASCORBIC ACID 500 MG PO TABS
500.0000 mg | ORAL_TABLET | Freq: Every evening | ORAL | Status: DC
  Administered 2021-11-08 – 2021-11-10 (×3): 500 mg via ORAL
  Filled 2021-11-08 (×3): qty 1

## 2021-11-08 MED ORDER — THROMBIN 5000 UNITS EX SOLR
CUTANEOUS | Status: AC
  Filled 2021-11-08: qty 5000

## 2021-11-08 MED ORDER — LACTATED RINGERS IV SOLN: INTRAVENOUS | Status: DC

## 2021-11-08 MED ORDER — DEXAMETHASONE SODIUM PHOSPHATE 10 MG/ML IJ SOLN
INTRAMUSCULAR | Status: DC | PRN
  Administered 2021-11-08: 5 mg via INTRAVENOUS

## 2021-11-08 MED ORDER — MIDAZOLAM HCL 2 MG/2ML IJ SOLN
INTRAMUSCULAR | Status: AC
  Filled 2021-11-08: qty 2

## 2021-11-08 MED ORDER — ARTIFICIAL TEARS OPHTHALMIC OINT
TOPICAL_OINTMENT | OPHTHALMIC | Status: AC
  Filled 2021-11-08: qty 3.5

## 2021-11-08 MED ORDER — TRIAMCINOLONE ACETONIDE 40 MG/ML IJ SUSP
INTRAMUSCULAR | Status: AC
  Filled 2021-11-08: qty 5

## 2021-11-08 MED ORDER — HYDROCORTISONE 10 MG PO TABS
10.0000 mg | ORAL_TABLET | Freq: Two times a day (BID) | ORAL | Status: DC
Start: 2021-11-08 — End: 2021-11-08

## 2021-11-08 MED ORDER — LIDOCAINE 2% (20 MG/ML) 5 ML SYRINGE
INTRAMUSCULAR | Status: DC | PRN
  Administered 2021-11-08: 60 mg via INTRAVENOUS

## 2021-11-08 MED ORDER — CEFAZOLIN SODIUM-DEXTROSE 2-4 GM/100ML-% IV SOLN
2.0000 g | Freq: Three times a day (TID) | INTRAVENOUS | Status: AC
  Administered 2021-11-08 – 2021-11-09 (×2): 2 g via INTRAVENOUS
  Filled 2021-11-08 (×2): qty 100

## 2021-11-08 MED ORDER — OXYMETAZOLINE HCL 0.05 % NA SOLN
NASAL | Status: DC | PRN
  Administered 2021-11-08: 2 via NASAL

## 2021-11-08 MED ORDER — CEFAZOLIN SODIUM-DEXTROSE 2-4 GM/100ML-% IV SOLN
2.0000 g | INTRAVENOUS | Status: AC
  Administered 2021-11-08: 2 g via INTRAVENOUS
  Filled 2021-11-08: qty 100

## 2021-11-08 MED ORDER — LIDOCAINE 2% (20 MG/ML) 5 ML SYRINGE
INTRAMUSCULAR | Status: AC
  Filled 2021-11-08: qty 5

## 2021-11-08 MED ORDER — TESTOSTERONE 20.25 MG/1.25GM (1.62%) TD GEL
1.0000 | TRANSDERMAL | Status: DC
Start: 2021-11-08 — End: 2021-11-08

## 2021-11-08 MED ORDER — ORAL CARE MOUTH RINSE: 15.0000 mL | Freq: Once | OROMUCOSAL | Status: AC

## 2021-11-08 MED ORDER — SALINE SPRAY 0.65 % NA SOLN
4.0000 | NASAL | Status: DC | PRN
  Administered 2021-11-09 – 2021-11-10 (×18): 4 via NASAL
  Filled 2021-11-08: qty 44

## 2021-11-08 MED ORDER — HEMOSTATIC AGENTS (NO CHARGE) OPTIME
TOPICAL | Status: DC | PRN
  Administered 2021-11-08: 1 via TOPICAL

## 2021-11-08 MED ORDER — LIDOCAINE-EPINEPHRINE 1 %-1:100000 IJ SOLN
INTRAMUSCULAR | Status: DC | PRN
  Administered 2021-11-08: 7 mL

## 2021-11-08 MED ORDER — HYDROCODONE-ACETAMINOPHEN 5-325 MG PO TABS
1.0000 | ORAL_TABLET | Freq: Four times a day (QID) | ORAL | Status: DC | PRN
Start: 2021-11-08 — End: 2021-11-08

## 2021-11-08 MED ORDER — PROCHLORPERAZINE MALEATE 10 MG PO TABS
10.0000 mg | ORAL_TABLET | Freq: Four times a day (QID) | ORAL | Status: DC | PRN
Start: 2021-11-08 — End: 2021-11-08

## 2021-11-08 MED ORDER — THROMBIN 5000 UNITS EX SOLR
OROMUCOSAL | Status: DC | PRN
  Administered 2021-11-08 (×2): 5 mL via TOPICAL

## 2021-11-08 MED ORDER — OMEGA-3-ACID ETHYL ESTERS 1 G PO CAPS
1.0000 g | ORAL_CAPSULE | Freq: Every day | ORAL | Status: DC
  Administered 2021-11-09 – 2021-11-11 (×3): 1 g via ORAL
  Filled 2021-11-08 (×3): qty 1

## 2021-11-08 MED ORDER — DEXAMETHASONE SODIUM PHOSPHATE 10 MG/ML IJ SOLN
INTRAMUSCULAR | Status: AC
  Filled 2021-11-08: qty 1

## 2021-11-08 MED ORDER — VITAMIN E 45 MG (100 UNIT) PO CAPS
400.0000 [IU] | ORAL_CAPSULE | Freq: Every evening | ORAL | Status: DC
  Administered 2021-11-08 – 2021-11-10 (×3): 400 [IU] via ORAL
  Filled 2021-11-08 (×5): qty 4

## 2021-11-08 MED ORDER — PHENYLEPHRINE HCL-NACL 20-0.9 MG/250ML-% IV SOLN
INTRAVENOUS | Status: DC | PRN
  Administered 2021-11-08: 20 ug/min via INTRAVENOUS

## 2021-11-08 MED ORDER — POLYETHYLENE GLYCOL 3350 17 G PO PACK: 17.0000 g | PACK | Freq: Every day | ORAL | Status: DC | PRN

## 2021-11-08 MED ORDER — ACETAMINOPHEN 500 MG PO TABS
1000.0000 mg | ORAL_TABLET | Freq: Once | ORAL | Status: AC
Start: 2021-11-08 — End: 2021-11-08
  Administered 2021-11-08: 1000 mg via ORAL
  Filled 2021-11-08: qty 2

## 2021-11-08 MED ORDER — FERROUS SULFATE 325 (65 FE) MG PO TABS
325.0000 mg | ORAL_TABLET | Freq: Every evening | ORAL | Status: DC
  Administered 2021-11-08 – 2021-11-10 (×3): 325 mg via ORAL
  Filled 2021-11-08 (×3): qty 1

## 2021-11-08 MED ORDER — PHENYLEPHRINE 80 MCG/ML (10ML) SYRINGE FOR IV PUSH (FOR BLOOD PRESSURE SUPPORT)
PREFILLED_SYRINGE | INTRAVENOUS | Status: DC | PRN
  Administered 2021-11-08: 80 ug via INTRAVENOUS

## 2021-11-08 MED ORDER — ADULT MULTIVITAMIN W/MINERALS CH
1.0000 | ORAL_TABLET | Freq: Every evening | ORAL | Status: DC
  Administered 2021-11-08 – 2021-11-10 (×3): 1 via ORAL
  Filled 2021-11-08 (×3): qty 1

## 2021-11-08 MED ORDER — HYDROCORTISONE 5 MG PO TABS
15.0000 mg | ORAL_TABLET | Freq: Every day | ORAL | Status: DC
  Administered 2021-11-09 – 2021-11-11 (×3): 15 mg via ORAL
  Filled 2021-11-08 (×3): qty 1

## 2021-11-08 MED ORDER — PROPOFOL 10 MG/ML IV BOLUS
INTRAVENOUS | Status: DC | PRN
  Administered 2021-11-08: 130 mg via INTRAVENOUS

## 2021-11-08 MED ORDER — MUPIROCIN 2 % EX OINT
TOPICAL_OINTMENT | CUTANEOUS | Status: AC
Start: 2021-11-08 — End: ?
  Filled 2021-11-08: qty 22

## 2021-11-08 MED ORDER — ROCURONIUM BROMIDE 10 MG/ML (PF) SYRINGE
PREFILLED_SYRINGE | INTRAVENOUS | Status: DC | PRN
  Administered 2021-11-08: 80 mg via INTRAVENOUS
  Administered 2021-11-08: 40 mg via INTRAVENOUS
  Administered 2021-11-08: 20 mg via INTRAVENOUS

## 2021-11-08 MED ORDER — LIDOCAINE-EPINEPHRINE 1 %-1:100000 IJ SOLN
INTRAMUSCULAR | Status: AC
  Filled 2021-11-08: qty 1

## 2021-11-08 MED ORDER — MIDAZOLAM HCL 2 MG/2ML IJ SOLN
INTRAMUSCULAR | Status: DC | PRN
  Administered 2021-11-08: 2 mg via INTRAVENOUS

## 2021-11-08 MED ORDER — ORAL CARE MOUTH RINSE: 15.0000 mL | OROMUCOSAL | Status: DC | PRN

## 2021-11-08 MED ORDER — ATORVASTATIN CALCIUM 10 MG PO TABS
10.0000 mg | ORAL_TABLET | Freq: Every evening | ORAL | Status: DC
  Administered 2021-11-08 – 2021-11-10 (×3): 10 mg via ORAL
  Filled 2021-11-08 (×3): qty 1

## 2021-11-08 MED ORDER — LACTATED RINGERS IV SOLN: INTRAVENOUS | Status: DC | PRN

## 2021-11-08 MED ORDER — HYDROMORPHONE HCL 1 MG/ML IJ SOLN: 0.5000 mg | INTRAMUSCULAR | Status: DC | PRN

## 2021-11-08 MED ORDER — THROMBIN (RECOMBINANT) 5000 UNITS EX SOLR
CUTANEOUS | Status: DC | PRN
  Administered 2021-11-08: 10 mL via TOPICAL

## 2021-11-08 MED ORDER — VITAMIN D 25 MCG (1000 UNIT) PO TABS
1000.0000 [IU] | ORAL_TABLET | Freq: Every evening | ORAL | Status: DC
  Administered 2021-11-08 – 2021-11-10 (×3): 1000 [IU] via ORAL
  Filled 2021-11-08 (×3): qty 1

## 2021-11-08 MED ORDER — DOCUSATE SODIUM 100 MG PO CAPS
100.0000 mg | ORAL_CAPSULE | Freq: Two times a day (BID) | ORAL | Status: DC
  Administered 2021-11-08 – 2021-11-11 (×6): 100 mg via ORAL
  Filled 2021-11-08 (×6): qty 1

## 2021-11-08 SURGICAL SUPPLY — 108 items
ATTRACTOMAT 16X20 MAGNETIC DRP (DRAPES) ×1 IMPLANT
BAG COUNTER SPONGE SURGICOUNT (BAG) ×4 IMPLANT
BAND RUBBER #18 3X1/16 STRL (MISCELLANEOUS) ×2 IMPLANT
BENZOIN TINCTURE PRP APPL 2/3 (GAUZE/BANDAGES/DRESSINGS) ×2 IMPLANT
BLADE RAD40 ROTATE 4M 4 5PK (BLADE) IMPLANT
BLADE ROTATE TRICUT 4X13 M4 (BLADE) ×2 IMPLANT
BLADE SURG 10 STRL SS (BLADE) ×1 IMPLANT
BLADE SURG 11 STRL SS (BLADE) ×4 IMPLANT
BLADE SURG 15 STRL LF DISP TIS (BLADE) ×2 IMPLANT
BLADE SURG 15 STRL SS (BLADE) ×2
BUR DIAMOND 13X5 70D (BURR) IMPLANT
BUR DIAMOND CURV 15X5 15D (BURR) IMPLANT
BUR TAPER CHOANAL ATRESIA 30K (BURR) ×1 IMPLANT
CABLE BIPOLOR RESECTION CORD (MISCELLANEOUS) ×2 IMPLANT
CANISTER SUCT 3000ML PPV (MISCELLANEOUS) ×5 IMPLANT
CATH LUMBAR HERMETIC 14G (CATHETERS) ×1 IMPLANT
CATHETER LUMBAR HERMETIC 14G (CATHETERS) ×2
COAGULATOR SUCT 8FR VV (MISCELLANEOUS) ×2 IMPLANT
COVER BACK TABLE 60X90IN (DRAPES) IMPLANT
COVER MAYO STAND STRL (DRAPES) ×3 IMPLANT
DRAPE HALF SHEET 40X57 (DRAPES) ×4 IMPLANT
DRAPE INCISE IOBAN 66X45 STRL (DRAPES) ×2 IMPLANT
DRAPE MICROSCOPE LEICA (MISCELLANEOUS) IMPLANT
DRAPE SURG 17X23 STRL (DRAPES) ×6 IMPLANT
DRESSING NASAL POPE 10X1.5X2.5 (GAUZE/BANDAGES/DRESSINGS) IMPLANT
DRSG NASAL POPE 10X1.5X2.5 (GAUZE/BANDAGES/DRESSINGS)
DURAPREP 26ML APPLICATOR (WOUND CARE) ×2 IMPLANT
ELECT COATED BLADE 2.86 ST (ELECTRODE) IMPLANT
ELECT NDL TIP 2.8 STRL (NEEDLE) ×1 IMPLANT
ELECT NEEDLE TIP 2.8 STRL (NEEDLE) IMPLANT
ELECT REM PT RETURN 9FT ADLT (ELECTROSURGICAL) ×2
ELECTRODE REM PT RTRN 9FT ADLT (ELECTROSURGICAL) ×2 IMPLANT
GAUZE PACKING FOLDED 2  STR (GAUZE/BANDAGES/DRESSINGS) ×1
GAUZE PACKING FOLDED 2 STR (GAUZE/BANDAGES/DRESSINGS) ×1 IMPLANT
GAUZE SPONGE 2X2 8PLY STRL LF (GAUZE/BANDAGES/DRESSINGS) ×1 IMPLANT
GAUZE SPONGE 4X4 12PLY STRL (GAUZE/BANDAGES/DRESSINGS) ×1 IMPLANT
GLOVE BIOGEL M 7.0 STRL (GLOVE) ×4 IMPLANT
GLOVE BIOGEL PI IND STRL 7.5 (GLOVE) ×1 IMPLANT
GLOVE BIOGEL PI INDICATOR 7.5 (GLOVE) ×1
GLOVE ECLIPSE 7.5 STRL STRAW (GLOVE) ×2 IMPLANT
GLOVE EXAM NITRILE LRG STRL (GLOVE) IMPLANT
GLOVE EXAM NITRILE XL STR (GLOVE) IMPLANT
GLOVE EXAM NITRILE XS STR PU (GLOVE) IMPLANT
GLOVE SURG LTX SZ7.5 (GLOVE) ×2 IMPLANT
GOWN STRL REUS W/ TWL LRG LVL3 (GOWN DISPOSABLE) ×2 IMPLANT
GOWN STRL REUS W/ TWL XL LVL3 (GOWN DISPOSABLE) IMPLANT
GOWN STRL REUS W/TWL 2XL LVL3 (GOWN DISPOSABLE) ×2 IMPLANT
GOWN STRL REUS W/TWL LRG LVL3 (GOWN DISPOSABLE) ×2
GOWN STRL REUS W/TWL XL LVL3 (GOWN DISPOSABLE)
HEMOSTAT POWDER KIT SURGIFOAM (HEMOSTASIS) ×3 IMPLANT
HEMOSTAT SURGICEL 2X14 (HEMOSTASIS) IMPLANT
KIT BASIN OR (CUSTOM PROCEDURE TRAY) ×3 IMPLANT
KIT DRAIN CSF ACCUDRAIN (MISCELLANEOUS) ×1 IMPLANT
KIT TURNOVER KIT B (KITS) ×3 IMPLANT
KNIFE ARACHNOID DISP AM-21-S (BLADE) IMPLANT
NDL HYPO 18GX1.5 BLUNT FILL (NEEDLE) ×1 IMPLANT
NDL HYPO 25GX1X1/2 BEV (NEEDLE) ×1 IMPLANT
NDL HYPO 25X1 1.5 SAFETY (NEEDLE) ×1 IMPLANT
NDL SPNL 22GX3.5 QUINCKE BK (NEEDLE) ×1 IMPLANT
NDL SPNL 25GX3.5 QUINCKE BL (NEEDLE) ×1 IMPLANT
NEEDLE HYPO 18GX1.5 BLUNT FILL (NEEDLE) ×2 IMPLANT
NEEDLE HYPO 25GX1X1/2 BEV (NEEDLE) IMPLANT
NEEDLE HYPO 25X1 1.5 SAFETY (NEEDLE) ×2 IMPLANT
NEEDLE SPNL 22GX3.5 QUINCKE BK (NEEDLE) ×2 IMPLANT
NEEDLE SPNL 25GX3.5 QUINCKE BL (NEEDLE) IMPLANT
NS IRRIG 1000ML POUR BTL (IV SOLUTION) ×3 IMPLANT
PAD ARMBOARD 7.5X6 YLW CONV (MISCELLANEOUS) ×6 IMPLANT
PATTIES SURGICAL .25X.25 (GAUZE/BANDAGES/DRESSINGS) IMPLANT
PATTIES SURGICAL .5 X.5 (GAUZE/BANDAGES/DRESSINGS) ×2 IMPLANT
PATTIES SURGICAL .5 X3 (DISPOSABLE) ×2 IMPLANT
PATTIES SURGICAL 1X1 (DISPOSABLE) ×2 IMPLANT
PENCIL BUTTON HOLSTER BLD 10FT (ELECTRODE) ×1 IMPLANT
PROBE FOR NEUROSURGERY (MISCELLANEOUS) IMPLANT
SEALANT ADHERUS EXTEND TIP (MISCELLANEOUS) ×1 IMPLANT
SHEATH ENDOSCRUB 0 DEG (SHEATH) ×2 IMPLANT
SHEATH ENDOSCRUB 30 DEG (SHEATH) IMPLANT
SHEATH ENDOSCRUB 45 DEG (SHEATH) IMPLANT
SPECIMEN JAR SMALL (MISCELLANEOUS) IMPLANT
SPLINT NASAL DOYLE BI-VL (GAUZE/BANDAGES/DRESSINGS) IMPLANT
SPONGE GAUZE 2X2 STER 10/PKG (GAUZE/BANDAGES/DRESSINGS) ×1
SPONGE NEURO XRAY DETECT 1X3 (DISPOSABLE) ×2 IMPLANT
SPONGE SURGIFOAM ABS GEL SZ50 (HEMOSTASIS) ×1 IMPLANT
SPONGE T-LAP 4X18 ~~LOC~~+RFID (SPONGE) ×1 IMPLANT
STAPLER SKIN PROX WIDE 3.9 (STAPLE) ×1 IMPLANT
STRIP CLOSURE SKIN 1/2X4 (GAUZE/BANDAGES/DRESSINGS) ×2 IMPLANT
SUT 5.0 PDS RB-1 (SUTURE)
SUT BONE WAX W31G (SUTURE) ×2 IMPLANT
SUT ETHILON 3 0 FSL (SUTURE) IMPLANT
SUT ETHILON 3 0 PS 1 (SUTURE) IMPLANT
SUT ETHILON 6 0 P 1 (SUTURE) IMPLANT
SUT PDS AB 4-0 RB1 27 (SUTURE) IMPLANT
SUT PDS PLUS AB 5-0 RB-1 (SUTURE) IMPLANT
SUT PLAIN 4 0 ~~LOC~~ 1 (SUTURE) IMPLANT
SUT VIC AB 4-0 P-3 18X BRD (SUTURE) IMPLANT
SUT VIC AB 4-0 P3 18 (SUTURE)
SYR CONTROL 10ML LL (SYRINGE) ×2 IMPLANT
TOWEL GREEN STERILE (TOWEL DISPOSABLE) ×2 IMPLANT
TOWEL GREEN STERILE FF (TOWEL DISPOSABLE) ×4 IMPLANT
TRACKER ENT INSTRUMENT (MISCELLANEOUS) ×2 IMPLANT
TRACKER ENT PATIENT (MISCELLANEOUS) ×2 IMPLANT
TRAP SPECIMEN MUCUS 40CC (MISCELLANEOUS) IMPLANT
TRAY ENT MC OR (CUSTOM PROCEDURE TRAY) ×4 IMPLANT
TRAY FOLEY MTR SLVR 16FR STAT (SET/KITS/TRAYS/PACK) ×2 IMPLANT
TUBE CONNECTING 12X1/4 (SUCTIONS) ×2 IMPLANT
TUBING EXTENTION W/L.L. (IV SETS) ×2 IMPLANT
TUBING STRAIGHTSHOT EPS 5PK (TUBING) ×1 IMPLANT
WATER STERILE IRR 1000ML POUR (IV SOLUTION) ×2 IMPLANT
YANKAUER SUCT BULB TIP NO VENT (SUCTIONS) ×1 IMPLANT

## 2021-11-08 NOTE — Anesthesia Procedure Notes (Signed)
Procedure Name: Intubation Date/Time: 11/08/2021 8:56 AM  Performed by: Genelle Bal, CRNAPre-anesthesia Checklist: Patient identified, Emergency Drugs available, Suction available and Patient being monitored Patient Re-evaluated:Patient Re-evaluated prior to induction Oxygen Delivery Method: Circle system utilized Preoxygenation: Pre-oxygenation with 100% oxygen Induction Type: IV induction Ventilation: Mask ventilation without difficulty and Oral airway inserted - appropriate to patient size Laryngoscope Size: Miller and 3 Grade View: Grade II Tube type: Oral Number of attempts: 1 Airway Equipment and Method: Stylet and Oral airway Placement Confirmation: ETT inserted through vocal cords under direct vision, positive ETCO2 and breath sounds checked- equal and bilateral Secured at: 23 cm Tube secured with: Tape Dental Injury: Teeth and Oropharynx as per pre-operative assessment  Comments: Inserted by Laurence Spates

## 2021-11-08 NOTE — Op Note (Signed)
PATIENT: Marc [name]  DAY OF SURGERY: 11/08/21   PRE-OPERATIVE DIAGNOSIS:  Pituitary adenoma   POST-OPERATIVE DIAGNOSIS:  Pituitary adenoma   PROCEDURE:  Endonasal endoscopic transphenoidal resection of pituitary tumor   SURGEON:  Surgeon(s) and Role:    Judith Part, MD - Co-surgeon    Jerrell Belfast, MD - Co-surgeon   ANESTHESIA: ETGA   BRIEF HISTORY: This is a 66 year old man with a history of limited clear cell RCC who presented with dense bitemporal hemianopsia and panhypopituitarism. The patient was found to have a sellar mass. I therefore recommended surgical intervention via the endonasal route. Along with the usual discussion of r/b/a regarding a TSA, we discussed that there are multiple findings suspicious for a renal cell met and that sellar metastases have multiple features, especially RCC, that make them more difficult to resect, more vascular, etc that can lead to continued symptoms or potentially the need for a supratentorial approach. This was discussed with the patient as well as risks, benefits, and alternatives and wished to proceed with surgical treatment.   OPERATIVE DETAIL: Dr. Wilburn Cornelia performed the nasal portion of the approach and is dictated separately.   For the neurosurgical portion of the case, Dr. Wilburn Cornelia and I used a combination of visual anatomic landmarks and frameless stereotaxy to confirm orientation and anatomy. The tumor had somewhat impressively already delivered itself caudally through the sphenoid defect and was protuberant. The dura of the sella was then opened sharply with a #11 blade in a cruciate fashion. Tumor was immediately encountered, given the diagnostic uncertainty, I sent a frozen sample while we continued to work. With Dr. Victorio Palm assistance while holding the endoscope, the tumor was then resected in a deliberate fashion with bimanual technique using a combination of ring curettes, suction, and penfield #4. It was very  fibrous, firm, and vascular, very consistent with an RCC over a normal pituitary adenoma. It was difficult to resect, not surprisingly. But we were able to get continued slow mobilization and debulking while stopping to get hemostasis. The frozen came back as suspicious for RCC. We were able to get all the way back to the diaphragma and, while pulling tumor off with the [facility] #4, got an obvious gush of CSF. I placed some floseal over the area with a half by half and it surprisingly stopped the leak. The resection was continued until the diaphragma fell down into the field. There was still gross tumor involving the diaphragma, but this did not seem resectable without a high chance of further CSF leak. Those remnants were highly vascular and a set up for a difficult reconstruction, so we stopped the resection after we had evidence of a good decompression of the optic apparatus. Hemostasis was difficult due to the residual hypervascular tumor, bipolar was used with multiple rounds of hemostatic agents. Once it was improved sufficiently, I packed the sella using gelfoam and some floseal followed by Adheris dural sealant with good hemostasis.   Dr. Wilburn Cornelia then took over to complete the skull base reconstruction.   EBL:  1040m   DRAINS: none   SPECIMENS: Sellar mass sent for frozen and permanent   TJudith Part MD 11/08/21 8:41 AM

## 2021-11-08 NOTE — Progress Notes (Signed)
Per orders regarding UOP, renal function panel ordered and Urine specific gravity. Patient now with some complaints of post nasal drip. Per orders, call neurosurgery once RFP resulted.    11/08/21 1602  Urine Characteristics  Urinary Interventions Urine specific gravity  Urine Specific Gravity (!) 1.002

## 2021-11-08 NOTE — Progress Notes (Signed)
Neurosurgery NP made aware of sodium levels (139 to 137), increased urine output (1835m in 4 hours) and specific gravity (1.002).  No new orders given at this time. She said to encourage patient to drink more fluids.

## 2021-11-08 NOTE — Anesthesia Procedure Notes (Signed)
Arterial Line Insertion Start/End8/12/2021 8:05 AM, 11/08/2021 8:25 AM Performed by: Annye Asa, MD, Genelle Bal, CRNA, CRNA  Patient location: Pre-op. Preanesthetic checklist: patient identified, IV checked, site marked, risks and benefits discussed, surgical consent, monitors and equipment checked, pre-op evaluation, timeout performed and anesthesia consent Lidocaine 1% used for infiltration Right, radial was placed Catheter size: 20 G Hand hygiene performed  and maximum sterile barriers used   Attempts: 2 Procedure performed without using ultrasound guided technique. Following insertion, dressing applied. Post procedure assessment: normal and unchanged  Patient tolerated the procedure well with no immediate complications.

## 2021-11-08 NOTE — Discharge Instructions (Signed)
Sinus/Nasal Instructions:  1. Limited activity 2. Liquid and soft diet 3. May bathe and shower 4. Saline nasal spray - 4 puffs/nostril every hour while awake, begin the morning after surgery 5. Elevate Head of Bed 6. No nose blowing/Open mouth sneeze 7. Alternate Tylenol and ibuprofen every 6 hours as needed for pain.  Call [facility] ENT for any questions regarding nasal issues: [phone]

## 2021-11-08 NOTE — Progress Notes (Signed)
   ENT Progress Note: Procedure(s): ENDOSCOPIC ENDONASAL RESECTION OF SELLAR MASS TRANSPHENOIDAL APPROACH EXPOSURE   Subjective: Stable preop - no change  Objective: Vital signs in last 24 hours: Temp:  [97.8 F (36.6 C)] 97.8 F (36.6 C) (08/09 0709) Pulse Rate:  [74] 74 (08/09 0709) Resp:  [17] 17 (08/09 0709) BP: (135)/(77) 135/77 (08/09 0709) SpO2:  [96 %] 96 % (08/09 0709) Weight change:     Intake/Output from previous day: No intake/output data recorded. Intake/Output this shift: No intake/output data recorded.  Labs: No results for input(s): "WBC", "HGB", "HCT", "PLT" in the last 72 hours. No results for input(s): "NA", "K", "CL", "CO2", "GLUCOSE", "BUN", "CALCIUM" in the last 72 hours.  Invalid input(s): "CREATININR"  Studies/Results: No results found.   PHYSICAL EXAM: Deviated nasal septum   Assessment/Plan: Pt for endoscopic trans-sphenoidal pituitary resection.    Marc [name] 11/08/2021, 8:32 AM

## 2021-11-08 NOTE — H&P (Signed)
Surgical H&P Update  HPI: 66 y.o. with a history of clear cell RCC with progressive visual loss, panhypopituitarism, workup showed a sellar mass. No changes in health since they were last seen. Still having the above and wishes to proceed with surgery. He has already taken his stress dose steroids this morning.   PMHx:  Past Medical History:  Diagnosis Date   Arthritis    Cancer ([place])    Kidney, Lung, Pituitary   Hyperlipidemia    Hypothyroidism    FamHx: History reviewed. No pertinent family history. SocHx:  reports that he quit smoking about 6 years ago. His smoking use included cigarettes. He has never used smokeless tobacco. He reports current alcohol use. He reports that he does not use drugs.  Physical Exam: Dense bitemporal hemianopsia, EOMI, FS & SS, Fcx4 5/5 x4  Assesment/Plan: 66 y.o. man with RCC and sellar mass, here for endoscopic endonasal transphenoidal resection. Risks, benefits, and alternatives discussed and the patient would like to continue with surgery.  -OR today -4N ICU post-op  Judith Part, MD 11/08/21 8:39 AM

## 2021-11-08 NOTE — Anesthesia Postprocedure Evaluation (Signed)
Anesthesia Post Note  Patient: Marc [name]  Procedure(s) Performed: ENDOSCOPIC ENDONASAL RESECTION OF SELLAR MASS TRANSPHENOIDAL APPROACH EXPOSURE     Patient location during evaluation: PACU Anesthesia Type: General Level of consciousness: patient cooperative, oriented and sedated Pain management: pain level controlled Vital Signs Assessment: post-procedure vital signs reviewed and stable Respiratory status: spontaneous breathing, nonlabored ventilation and respiratory function stable Cardiovascular status: blood pressure returned to baseline and stable Postop Assessment: no apparent nausea or vomiting Anesthetic complications: no   No notable events documented.  Last Vitals:  Vitals:   11/08/21 1300 11/08/21 1400  BP: 110/60 114/72  Pulse: 65 71  Resp: 11 15  Temp:    SpO2: 96% 100%    Last Pain:  Vitals:   11/08/21 1245  TempSrc:   PainSc: Asleep                 Damione Robideau,E. Nakaiya Beddow

## 2021-11-08 NOTE — Op Note (Signed)
Operative Note:  ENDOSCOPIC TRANSSPHENOIDAL PITUITARY RESECTION WITH NAVIGATION      Patient: Marc [name]  Medical record number: [id number]  Date:11/08/2021  Pre-operative Indications: 1.  Pituitary Mass       Postoperative Indications: Same  Surgical Procedure: 1. Endoscopic transsphenoidal pituitary resection with intraoperative navigation          Anesthesia: GET  Surgeon: Delsa Bern, M.D.  Neurosurgeon: Emelda Brothers, MD  Complications: None  EBL: 1000 cc  Findings: Nonobstructive deviated nasal septum, no septoplasty required.  Normal-appearing sinus anatomy.  Note: The neurosurgical component of the operative procedure is dictated as a separate operative note.   Brief History: The patient is a 66 y.o. male with a history of pituitary mass. The patient has a history of visual and hormonal changes.  Patient with a history of renal cell carcinoma.  The patient was referred to Dr. Zada Finders for neurosurgical evaluation.  Patient seen by me at [facility] preoperatively with review of nasal anatomy and sinus CT scan for navigation.  Given the patient's history and findings, the above surgical procedures were recommended, risks and benefits were discussed in detail with the patient.  They understand and agree with our plan for surgery which is scheduled at [facility] under general anesthesia.  Surgical Procedure: The patient is brought to the neurosurgical operating room on 11/08/2021 and placed in supine position on the operating table. General endotracheal anesthesia was established without difficulty. When the patient was adequately anesthetized, surgical timeout was performed with correct identification of the patient and the surgical procedure. The patient's nose was then injected with 7 cc of 1% lidocaine 1:100,000 dilution epinephrine which was injected in a submucosal fashion. The patient's nose was then packed with Afrin-soaked cottonoid pledgets  were left in place for approximately 10 minutes to allow for vasoconstriction and hemostasis.  The Xomed Fusion navigation headgear was applied and anatomic and surgical landmarks were identified and confirmed, navigation was used throughout the sinus component of the surgical procedure.  With the patient prepped draped and prepared for surgery, nasal endoscopy was performed on the patient's right.  The middle turbinate was carefully lateralized to allow access to the posterior aspect of the nasal passageway.  The right sphenoid sinus ostium was identified.  The inferior aspect of the superior turbinate was then resected with through-cutting forceps and a microdebrider.  The right sphenoid sinus ostium was enlarged in a superior and lateral direction using the microdebrider and through-cutting forceps to create a widely patent ostium.  Nasal endoscopy on the patient's left-hand side was then undertaken.  The left middle turbinate was lateralized and the posterior nasal cavity was visualized with identification of the left sphenoid sinus ostium using navigation.  The inferior aspect of the superior turbinate was resected and the sinus ostium was enlarged in the lateral and superior direction to create a wide sphenoid sinus ostium.  A posterior septectomy was then performed with a Surveyor, quantity.  Bone, cartilage and soft tissue was then resected to create a wide posterior septotomy.  The anterior face of the sphenoid sinus and sphenoid sinus septum were then resected with a combination of through-cutting forceps, osteotome and microdebrider to allow direct access to the entire posterior aspect of the sphenoid sinus and pituitary fossa.  Sphenoid sinus mucosa overlying the pituitary fossa was elevated and lateralized.  The anterior face of the pituitary fossa was demarcated using navigation.  With adequate access to the pituitary fossa the neurosurgical component of the procedure  was begun by Dr. Zada Finders.   This is dictated as a separate operative report.  Resection of the pituitary tumor was undertaken using direct visualization of the 0 degree endoscope, navigation and blunt and sharp dissection.  With pituitary tumor resection completed, reconstruction was undertaken.  Pituitary fossa packed with Gelfoam and Adheris dural glue was placed over the pituitary fossa defect. There was no active bleeding and no evidence of spinal fluid leak.  The sphenoid sinus was carefully inspected, no further bleeding along the mucosal margins, sphenoidotomy sites or posterior septectomy.  Middle turbinates were medialized to their normal anatomic position.  The patient's nasal cavity was irrigated and suctioned.  Surgical sponge count was correct. An oral gastric tube was passed and the stomach contents were aspirated. Patient was awakened from anesthetic and transferred from the operating room to the recovery room in stable condition. There were no complications and blood loss was 1000 cc.   Delsa Bern, M.D. [facility] 11/08/2021

## 2021-11-08 NOTE — Transfer of Care (Signed)
Immediate Anesthesia Transfer of Care Note  Patient: Marc [name]  Procedure(s) Performed: ENDOSCOPIC ENDONASAL RESECTION OF SELLAR MASS TRANSPHENOIDAL APPROACH EXPOSURE  Patient Location: PACU  Anesthesia Type:General  Level of Consciousness: awake, alert  and oriented  Airway & Oxygen Therapy: Patient Spontanous Breathing  Post-op Assessment: Report given to RN and Post -op Vital signs reviewed and stable  Post vital signs: Reviewed and stable  Last Vitals:  Vitals Value Taken Time  BP 105/51 11/08/21 1220  Temp    Pulse 70 11/08/21 1222  Resp 13 11/08/21 1222  SpO2 94 % 11/08/21 1222  Vitals shown include unvalidated device data.  Last Pain:  Vitals:   11/08/21 0738  TempSrc:   PainSc: 0-No pain         Complications: No notable events documented.

## 2021-11-09 ENCOUNTER — Encounter (HOSPITAL_COMMUNITY): Payer: Self-pay | Admitting: Neurological Surgery

## 2021-11-09 ENCOUNTER — Other Ambulatory Visit: Payer: Self-pay

## 2021-11-09 LAB — RENAL FUNCTION PANEL
Albumin: 3.7 g/dL (ref 3.5–5.0)
Anion gap: 6 (ref 5–15)
BUN: 14 mg/dL (ref 8–23)
CO2: 27 mmol/L (ref 22–32)
Calcium: 8.9 mg/dL (ref 8.9–10.3)
Chloride: 104 mmol/L (ref 98–111)
Creatinine, Ser: 1.08 mg/dL (ref 0.61–1.24)
GFR, Estimated: >60 mL/min (ref >60)
Glucose, Bld: 115 mg/dL — ABNORMAL HIGH (ref 70–99)
Phosphorus: 3.4 mg/dL (ref 2.5–4.6)
Potassium: 3.8 mmol/L (ref 3.5–5.1)
Sodium: 137 mmol/L (ref 135–145)

## 2021-11-09 LAB — BASIC METABOLIC PANEL
Anion gap: 7 (ref 5–15)
BUN: 12 mg/dL (ref 8–23)
CO2: 24 mmol/L (ref 22–32)
Calcium: 8.6 mg/dL — ABNORMAL LOW (ref 8.9–10.3)
Chloride: 107 mmol/L (ref 98–111)
Creatinine, Ser: 1.09 mg/dL (ref 0.61–1.24)
GFR, Estimated: >60 mL/min (ref >60)
Glucose, Bld: 132 mg/dL — ABNORMAL HIGH (ref 70–99)
Potassium: 4.3 mmol/L (ref 3.5–5.1)
Sodium: 138 mmol/L (ref 135–145)

## 2021-11-09 LAB — CBC
HCT: 27.1 % — ABNORMAL LOW (ref 39.0–52.0)
Hemoglobin: 9.1 g/dL — ABNORMAL LOW (ref 13.0–17.0)
MCH: 29.6 pg (ref 26.0–34.0)
MCHC: 33.6 g/dL (ref 30.0–36.0)
MCV: 88.3 fL (ref 80.0–100.0)
Platelets: 234 10*3/uL (ref 150–400)
RBC: 3.07 MIL/uL — ABNORMAL LOW (ref 4.22–5.81)
RDW: 13.4 % (ref 11.5–15.5)
WBC: 15.7 10*3/uL — ABNORMAL HIGH (ref 4.0–10.5)
nRBC: 0 % (ref 0.0–0.2)

## 2021-11-09 LAB — SURGICAL PATHOLOGY

## 2021-11-09 MED ORDER — DESMOPRESSIN ACETATE 0.1 MG PO TABS
0.0500 mg | ORAL_TABLET | Freq: Two times a day (BID) | ORAL | Status: DC
  Administered 2021-11-09 – 2021-11-11 (×5): 0.05 mg via ORAL
  Filled 2021-11-09 (×7): qty 1

## 2021-11-09 MED FILL — Thrombin For Soln 5000 Unit: CUTANEOUS | Qty: 2 | Status: AC

## 2021-11-09 MED FILL — Thrombin For Soln 5000 Unit: CUTANEOUS | Qty: 5000 | Status: AC

## 2021-11-09 NOTE — Evaluation (Signed)
Occupational Therapy Evaluation Patient Details Name: Marc [name] MRN: [id number] DOB: 1956-03-30 Today's Date: 11/09/2021   History of Present Illness Pt is a 66 y.o. male who presented 11/08/21 for endoscopic endonasal transphenoidal resection of pituitary tumor secondary to having RCC and sellar mass. PMH: arthritis, cancer, HLD, hypothyroidism   Clinical Impression   PTA pt was experiencing B temporal field cuts and "mild" memory problems.  Pt reports improvement in his vision s/p surgery. R visual field appears more impaired than L. Visual filed deficit does not appear to interfere with his ability to complete ADL or IADL tasks, however recommend initial S with medication management. Pt verbalized understanding. Pt has a follow up appointment scheduled with his eye specialist. Recommend pt discuss any driving restrictions with his eye doctor. No further OT needs.      Recommendations for follow up therapy are one component of a multi-disciplinary discharge planning process, led by the attending physician.  Recommendations may be updated based on patient status, additional functional criteria and insurance authorization.   Follow Up Recommendations  No OT follow up (follow up with eye doctor)    Assistance Recommended at Discharge Set up Supervision/Assistance  Patient can return home with the following Assist for transportation    Functional Status Assessment  Patient has had a recent decline in their functional status and/or demonstrates limited ability to make significant improvements in function in a reasonable and predictable amount of time  Equipment Recommendations  None recommended by OT    Recommendations for Other Services       Precautions / Restrictions Precautions Precautions: None Restrictions Weight Bearing Restrictions: No      Mobility Bed Mobility Overal bed mobility: Modified Independent             General bed mobility comments: Pt able to  transition supine > sit R EOB with HOB elevated without assistance.    Transfers Overall transfer level: Independent Equipment used: None               General transfer comment: Able to come to stand from EOB quickly without LOB      Balance Overall balance assessment: No apparent balance deficits (not formally assessed)                                         ADL either performed or assessed with clinical judgement   ADL Overall ADL's : At baseline                                             Vision Baseline Vision/History: 1 Wears glasses Ability to See in Adequate Light: 0 Adequate Patient Visual Report: Peripheral vision impairment Vision Assessment?: Yes Eye Alignment: Within Functional Limits Alignment/Gaze Preference: Within Defined Limits Tracking/Visual Pursuits: Able to track stimulus in all quads without difficulty Saccades: Additional eye shifts occurred during testing Visual Fields: Other (comment) (Bitemporal filed loss; R worse than L) Additional Comments: uses readers; reports glare bothers his vision Educated on availability of blue light   Recommend pt discuss driving with his eye specialist; blue light blocking glasses available in readers to see if it would with eye strain while using the computer. Also discussed possible trial of yellow lenses to reduce glare     Perception Perception  Comments: WFL   Praxis      Pertinent Vitals/Pain Pain Assessment Pain Assessment: Faces Faces Pain Scale: Hurts a little bit Pain Location: headache Pain Descriptors / Indicators: Headache Pain Intervention(s): Limited activity within patient's tolerance     Hand Dominance Right   Extremity/Trunk Assessment Upper Extremity Assessment Upper Extremity Assessment: Overall WFL for tasks assessed   Lower Extremity Assessment Lower Extremity Assessment: Defer to PT evaluation   Cervical / Trunk Assessment Cervical /  Trunk Assessment: Normal   Communication Communication Communication: No difficulties   Cognition Arousal/Alertness: Awake/alert Behavior During Therapy: WFL for tasks assessed/performed Overall Cognitive Status: History of cognitive impairments - at baseline Area of Impairment: Memory, Attention                     Memory: Decreased short-term memory         General Comments: Pt reports problems with memory adn word recall at times however 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; states his ability to function is not affected     General Comments  reports vision has improvedq    Exercises     Shoulder Instructions      Home Living Family/patient expects to be discharged to:: Private residence Living Arrangements: Spouse/significant other Available Help at Discharge: Family;Available 24 hours/day Type of Home: House Home Access: Stairs to enter (to enter main level; level entry for entrance to basement living area, which he can drive up to) Entrance Stairs-Number of Steps: 3 Entrance Stairs-Rails: Right (ascending) Home Layout: Two level (2nd level is the main level, basement does have an option for him to live in it)     Bathroom Shower/Tub: Occupational psychologist: Handicapped height (in basement; standard toilet on main level) Bathroom Accessibility: Yes   Home Equipment: Conservation officer, nature (2 wheels);Cane - quad;Cane - single point;Shower seat;Wheelchair - manual          Prior Functioning/Environment Prior Level of Function : Independent/Modified Independent;Driving;Working/employed             Mobility Comments: No AD. No falls. ADLs Comments: Semi-retired, works as Dance movement psychotherapist, drives        OT Problem List: Impaired vision/perception      OT Treatment/Interventions:      OT Goals(Current goals can be found in the care plan section) Acute Rehab OT Goals Patient Stated Goal: to go home ASAP OT Goal Formulation: All assessment and  education complete, DC therapy  OT Frequency:      Co-evaluation              AM-PAC OT "6 Clicks" Daily Activity     Outcome Measure Help from another person eating meals?: None Help from another person taking care of personal grooming?: None Help from another person toileting, which includes using toliet, bedpan, or urinal?: None Help from another person bathing (including washing, rinsing, drying)?: None Help from another person to put on and taking off regular upper body clothing?: None Help from another person to put on and taking off regular lower body clothing?: None 6 Click Score: 24   End of Session Nurse Communication:  (DC needs)  Activity Tolerance: Patient tolerated treatment well Patient left: in chair;with call bell/phone within reach  OT Visit Diagnosis: Low vision, both eyes (H54.2)                Time: 1435-1458 OT Time Calculation (min): 23 min Charges:  OT General Charges $OT Visit: 1 Visit OT Evaluation $OT Eval Moderate  Complexity: 1 Mod OT Treatments $Therapeutic Activity: 8-22 mins  Maurie Boettcher, OT/L   Acute OT Clinical Specialist Acute Rehabilitation Services Pager [phone] Office [phone]   [facility] 11/09/2021, 3:38 PM

## 2021-11-09 NOTE — Evaluation (Signed)
Physical Therapy Evaluation & Discharge Patient Details Name: REMI LOPATA MRN: [id number] DOB: 07-27-1955 Today's Date: 11/09/2021  History of Present Illness  Pt is a 66 y.o. male who presented 11/08/21 for endoscopic endonasal transphenoidal resection of pituitary tumor secondary to having RCC and sellar mass. PMH: arthritis, cancer, HLD, hypothyroidism   Clinical Impression  Pt presents with condition above. PTA, he was IND, driving, and working as a Dance movement psychotherapist. Pt lives with his wife in a house with 3 STE the main level of the house from outside, but a flat entrance to access the basement living area from outside. Pt reports he was having progressive memory and visual deficits prior to surgery, but reports the vision is already improving. Pt is able to perform all functional mobility safely without LOB or assistance at this time. His DGI score of 23/24 suggests he is a safe ambulator. He appears to be performing functional mobility at his baseline, thus PT will sign off. All education completed and questions answered. Thank you for this referral.     Recommendations for follow up therapy are one component of a multi-disciplinary discharge planning process, led by the attending physician.  Recommendations may be updated based on patient status, additional functional criteria and insurance authorization.  Follow Up Recommendations No PT follow up      Assistance Recommended at Discharge PRN  Patient can return home with the following  Direct supervision/assist for medications management;Direct supervision/assist for financial management;Assist for transportation    Equipment Recommendations None recommended by PT  Recommendations for Other Services       Functional Status Assessment Patient has not had a recent decline in their functional status     Precautions / Restrictions Precautions Precautions: None Restrictions Weight Bearing Restrictions: No      Mobility  Bed  Mobility Overal bed mobility: Modified Independent             General bed mobility comments: Pt able to transition supine > sit R EOB with HOB elevated without assistance.    Transfers Overall transfer level: Independent Equipment used: None               General transfer comment: Able to come to stand from EOB quickly without LOB    Ambulation/Gait Ambulation/Gait assistance: Supervision, Independent Gait Distance (Feet): 240 Feet Assistive device: None Gait Pattern/deviations: WFL(Within Functional Limits) Gait velocity: WNL Gait velocity interpretation: >2.62 ft/sec, indicative of community ambulatory   General Gait Details: Pt with steady gait and no significant gait deviations. Pt able to change speeds, directions, and head positions and nevigate obstacles without LOB, progressing from supervision for safety initially to independence.  Stairs Stairs: Yes Stairs assistance: Min guard Stair Management: One rail Left, Alternating pattern, Step to pattern, Sideways, Forwards Number of Stairs: 3 General stair comments: Ascends forwards with reciprocal pattern, no LOB. Descends sideways with step-to pattern, leading down with R leg due to chronic R knee deficits per pt, no LOB. Min guard for Education officer, community    Modified Rankin (Stroke Patients Only)       Balance Overall balance assessment: No apparent balance deficits (not formally assessed)                               Standardized Balance Assessment Standardized Balance Assessment : Dynamic Gait Index   Dynamic Gait Index Level Surface: Normal Change in Gait Speed: Normal Gait with Horizontal Head Turns: Normal  Gait with Vertical Head Turns: Normal Gait and Pivot Turn: Normal Step Over Obstacle: Normal Step Around Obstacles: Normal Steps: Mild Impairment Total Score: 23       Pertinent Vitals/Pain Pain Assessment Pain Assessment: Faces Faces Pain Scale: Hurts a little  bit Pain Location: headache Pain Descriptors / Indicators: Headache Pain Intervention(s): Limited activity within patient's tolerance, Monitored during session    Home Living Family/patient expects to be discharged to:: Private residence Living Arrangements: Spouse/significant other Available Help at Discharge: Family;Available 24 hours/day Type of Home: House Home Access: Stairs to enter (to enter main level; level entry for entrance to basement living area, which he can drive up to) Entrance Stairs-Rails: Right (ascending) Entrance Stairs-Number of Steps: 3   Home Layout: Two level (2nd level is the main level, basement does have an option for him to live in it) Home Equipment: Conservation officer, nature (2 wheels);Cane - quad;Cane - single point;Shower seat;Wheelchair - manual      Prior Function Prior Level of Function : Independent/Modified Independent;Driving;Working/employed             Mobility Comments: No AD. No falls. ADLs Comments: Semi-retired, works as Dance movement psychotherapist.     Hand Dominance   Dominant Hand: Right    Extremity/Trunk Assessment   Upper Extremity Assessment Upper Extremity Assessment: Defer to OT evaluation    Lower Extremity Assessment Lower Extremity Assessment: Overall WFL for tasks assessed (MMT scores of 5 grossly bil; coordination and sensation intact bil)    Cervical / Trunk Assessment Cervical / Trunk Assessment: Kyphotic  Communication   Communication: No difficulties  Cognition Arousal/Alertness: Awake/alert Behavior During Therapy: WFL for tasks assessed/performed Overall Cognitive Status: Impaired/Different from baseline Area of Impairment: Memory, Attention                   Current Attention Level: Alternating Memory: Decreased short-term memory         General Comments: Pt reports memory deficits worsening recently prior to resection, noted pt forgetful to wait for therapist to cue him to stand at times. Disctrated by  lines intermittently.        General Comments General comments (skin integrity, edema, etc.): reports vision was impaired prior to surgery but is improving already; VSS on RA    Exercises     Assessment/Plan    PT Assessment Patient does not need any further PT services  PT Problem List         PT Treatment Interventions      PT Goals (Current goals can be found in the Care Plan section)  Acute Rehab PT Goals Patient Stated Goal: to improve PT Goal Formulation: All assessment and education complete, DC therapy Time For Goal Achievement: 11/10/21 Potential to Achieve Goals: Good    Frequency       Co-evaluation               AM-PAC PT "6 Clicks" Mobility  Outcome Measure Help needed turning from your back to your side while in a flat bed without using bedrails?: None Help needed moving from lying on your back to sitting on the side of a flat bed without using bedrails?: None Help needed moving to and from a bed to a chair (including a wheelchair)?: None Help needed standing up from a chair using your arms (e.g., wheelchair or bedside chair)?: None Help needed to walk in hospital room?: None Help needed climbing 3-5 steps with a railing? : A Little 6 Click Score: 23  End of Session Equipment Utilized During Treatment: Gait belt Activity Tolerance: Patient tolerated treatment well Patient left: in chair;with call bell/phone within reach;with chair alarm set;with family/visitor present Nurse Communication: Mobility status PT Visit Diagnosis: Other symptoms and signs involving the nervous system (R29.898)    Time: 4446-1901 PT Time Calculation (min) (ACUTE ONLY): 22 min   Charges:   PT Evaluation $PT Eval Low Complexity: 1 Low          Moishe Spice, PT, DPT Acute Rehabilitation Services  Office: [place] 11/09/2021, 1:09 PM

## 2021-11-09 NOTE — Progress Notes (Addendum)
Neurosurgery Service Progress Note  Subjective: No acute events overnight. DI overnight but actually kept up well with oral intake. He denied polyuria when I saw him preop but today he did note that the past month or so he's been urinating roughly every 1-2 hours w/ polydypsia. Vision subjectively a little better but on my exam dramatically improved.   Objective: Vitals:   11/09/21 0600 11/09/21 0700 11/09/21 0800 11/09/21 0900  BP: 113/62 119/66 126/69 139/73  Pulse: 61 67 67 68  Resp: '10 10 12 '$ (!) 9  Temp:   (!) 97.5 F (36.4 C)   TempSrc:   Oral   SpO2: 98% 98% 99% 98%  Height:        Physical Exam: VFF improved and grossly appear intact, strength 5/5 x4 and SILTx4, no rhinorrhea  Assessment & Plan: 66 y.o. man s/p endonasal endoscopic resection of likely RCC metastasis to the sella, recovering well.  -post-op versus continued DI with intact thirst mechanism. Pt is maintaining his own but preop panhypopit so I think low likelihood of this improving, will therefore start DDAVP and monitor for 24h to make sure his Na is stable -d/c A-line -RFP this afternoon and tomorrow AM to follow Na -SCDs/TEDs -will keep foley x1 more day given he's just had surgery and urinated 10L in the past 24h -instructed him to drink to thirst drive, doesn't need to drink extra / certain amount -likely discharge home tomorrow if he does well overnight   Judith Part  11/09/21 10:07 AM

## 2021-11-09 NOTE — Progress Notes (Addendum)
Notified Neurosurgery NP of pt's increased urine output.  From 2000 to 2200 pt had 2,350 mL of urine output.  Specific gravity was 1.002 and a renal function panel was sent off.  Sodium came back 138.  Pt has been drinking a lot of water.  Margo Aye, NP notified.  Will add a BMP to labs this am per Meyran.  U/O from 2200- 0100= 1,850 mL.  NP said no need to recheck SP and RFP at this time.

## 2021-11-09 NOTE — Progress Notes (Signed)
  Transition of Care [facility]) Screening Note   Patient Details  Name: Marc [name] Date of Birth: 1955-12-11   Transition of Care [facility]) CM/SW Contact:    Benard Halsted, LCSW Phone Number: 11/09/2021, 10:00 AM    Transition of Care Department [facility]) has reviewed patient and no TOC needs have been identified at this time. We will continue to monitor patient advancement through interdisciplinary progression rounds. If new patient transition needs arise, please place a TOC consult.

## 2021-11-10 LAB — RENAL FUNCTION PANEL
Albumin: 3.1 g/dL — ABNORMAL LOW (ref 3.5–5.0)
Albumin: 3.3 g/dL — ABNORMAL LOW (ref 3.5–5.0)
Anion gap: 5 (ref 5–15)
Anion gap: 7 (ref 5–15)
BUN: 17 mg/dL (ref 8–23)
BUN: 22 mg/dL (ref 8–23)
CO2: 27 mmol/L (ref 22–32)
CO2: 27 mmol/L (ref 22–32)
Calcium: 8.5 mg/dL — ABNORMAL LOW (ref 8.9–10.3)
Calcium: 8.4 mg/dL — ABNORMAL LOW (ref 8.9–10.3)
Chloride: 102 mmol/L (ref 98–111)
Chloride: 101 mmol/L (ref 98–111)
Creatinine, Ser: 1.12 mg/dL (ref 0.61–1.24)
Creatinine, Ser: 1.04 mg/dL (ref 0.61–1.24)
GFR, Estimated: >60 mL/min (ref >60)
GFR, Estimated: >60 mL/min (ref >60)
Glucose, Bld: 105 mg/dL — ABNORMAL HIGH (ref 70–99)
Glucose, Bld: 107 mg/dL — ABNORMAL HIGH (ref 70–99)
Phosphorus: 3 mg/dL (ref 2.5–4.6)
Phosphorus: 2.8 mg/dL (ref 2.5–4.6)
Potassium: 3.8 mmol/L (ref 3.5–5.1)
Potassium: 3.7 mmol/L (ref 3.5–5.1)
Sodium: 134 mmol/L — ABNORMAL LOW (ref 135–145)
Sodium: 135 mmol/L (ref 135–145)

## 2021-11-10 MED ORDER — ORAL CARE MOUTH RINSE: 15.0000 mL | OROMUCOSAL | Status: DC | PRN

## 2021-11-10 NOTE — Progress Notes (Signed)
Pt had an episode of vomiting this evening. Zofran IV is given. Ostergard , MD is notified.

## 2021-11-10 NOTE — Discharge Summary (Signed)
Discharge Summary  Date of Admission: 11/08/2021  Date of Discharge: 11/11/2021  Attending Physician: Emelda Brothers, MD  Hospital Course: Patient was admitted following an uncomplicated endoscopic endonasal resection of a sellar tumor. They were recovered in PACU and transferred to 4N. Post-op, he had some worsening of his preop DI, was started on DDAVP and Na stayed stable at the low end of normal with dramatically decreased UOP. Vision was improved compared to preop, final path came back as RCC, as expected, and pt was informed. Their hospital course was uncomplicated and the patient was discharged home on 11/11/21. They will follow up in clinic with me in clinic in 2 weeks.  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

## 2021-11-11 LAB — RENAL FUNCTION PANEL
Albumin: 3.1 g/dL — ABNORMAL LOW (ref 3.5–5.0)
Anion gap: 5 (ref 5–15)
BUN: 20 mg/dL (ref 8–23)
CO2: 27 mmol/L (ref 22–32)
Calcium: 8.2 mg/dL — ABNORMAL LOW (ref 8.9–10.3)
Chloride: 101 mmol/L (ref 98–111)
Creatinine, Ser: 1.04 mg/dL (ref 0.61–1.24)
GFR, Estimated: >60 mL/min (ref >60)
Glucose, Bld: 94 mg/dL (ref 70–99)
Phosphorus: 3.4 mg/dL (ref 2.5–4.6)
Potassium: 4 mmol/L (ref 3.5–5.1)
Sodium: 133 mmol/L — ABNORMAL LOW (ref 135–145)

## 2021-11-11 MED ORDER — DESMOPRESSIN ACETATE 0.1 MG PO TABS: 0.0500 mg | ORAL_TABLET | Freq: Two times a day (BID) | ORAL | 2 refills | Status: DC

## 2021-11-11 MED ORDER — HYDROCODONE-ACETAMINOPHEN 5-325 MG PO TABS: 1.0000 | ORAL_TABLET | ORAL | 0 refills | Status: DC | PRN

## 2021-11-11 NOTE — Progress Notes (Signed)
Discharge instructions are provided to the pt. All questions are answered.

## 2021-11-11 NOTE — Plan of Care (Signed)
  Problem: Education: Goal: Knowledge of the prescribed therapeutic regimen will improve Outcome: Progressing   Problem: Nutrition: Goal: Adequate nutrition will be maintained Outcome: Progressing   Problem: Pain Managment: Goal: General experience of comfort will improve Outcome: Progressing

## 2021-11-11 NOTE — Plan of Care (Signed)
  Problem: Education: Goal: Knowledge of the prescribed therapeutic regimen will improve Outcome: Progressing   Problem: Clinical Measurements: Goal: Usual level of consciousness will be regained or maintained. Outcome: Progressing Goal: Neurologic status will improve Outcome: Progressing Goal: Ability to maintain intracranial pressure will improve Outcome: Progressing   Problem: Skin Integrity: Goal: Demonstration of wound healing without infection will improve Outcome: Progressing   Problem: Education: Goal: Knowledge of General Education information will improve Description: Including pain rating scale, medication(s)/side effects and non-pharmacologic comfort measures Outcome: Progressing   Problem: Health Behavior/Discharge Planning: Goal: Ability to manage health-related needs will improve Outcome: Progressing   Problem: Clinical Measurements: Goal: Ability to maintain clinical measurements within normal limits will improve Outcome: Progressing Goal: Will remain free from infection Outcome: Progressing Goal: Diagnostic test results will improve Outcome: Progressing Goal: Respiratory complications will improve Outcome: Progressing Goal: Cardiovascular complication will be avoided Outcome: Progressing   Problem: Activity: Goal: Risk for activity intolerance will decrease Outcome: Progressing   Problem: Nutrition: Goal: Adequate nutrition will be maintained Outcome: Progressing   Problem: Elimination: Goal: Will not experience complications related to bowel motility Outcome: Progressing Goal: Will not experience complications related to urinary retention Outcome: Progressing   Problem: Pain Managment: Goal: General experience of comfort will improve Outcome: Progressing

## 2021-11-11 NOTE — Progress Notes (Signed)
Neurosurgery Service Progress Note  Subjective: No acute events overnight.  Objective: Vitals:   11/10/21 2348 11/11/21 0444 11/11/21 0906 11/11/21 0910  BP: 104/63 102/69 120/73 120/73  Pulse: 65 68 63 63  Resp:  '16 18 18  '$ Temp:  (!) 97.5 F (36.4 C) 98 F (36.7 C) 98 F (36.7 C)  TempSrc:   Oral Oral  SpO2: 97% 99% 100%   Weight:    108 kg  Height:    5' 7.99" (1.727 m)    Physical Exam: VFF improved and grossly appear intact, strength 5/5 x4 and SILTx4, no rhinorrhea  Assessment & Plan: 66 y.o. man s/p endonasal endoscopic resection of likely RCC metastasis to the sella, recovering well.  -Na stable, discharge today, RFP in 1 week  Judith Part  11/11/21 2:10 PM

## 2021-11-12 ENCOUNTER — Other Ambulatory Visit: Payer: Self-pay | Admitting: Oncology

## 2021-11-12 DIAGNOSIS — C642 Malignant neoplasm of left kidney, except renal pelvis: Secondary | ICD-10-CM

## 2021-11-12 LAB — TYPE AND SCREEN
ABO/RH(D): A POS
Antibody Screen: NEGATIVE
Unit division: 0
Unit division: 0

## 2021-11-12 LAB — BPAM RBC
Blood Product Expiration Date: 202308262359
Blood Product Expiration Date: 202308272359
ISSUE DATE / TIME: 202308091151
ISSUE DATE / TIME: 202308091151
Unit Type and Rh: 6200
Unit Type and Rh: 6200

## 2021-11-20 ENCOUNTER — Encounter: Payer: Self-pay | Admitting: Oncology

## 2021-11-27 NOTE — Progress Notes (Signed)
Pharmacist Chemotherapy Monitoring - Initial Assessment    Anticipated start date: 12/05/21   The following has been reviewed per standard work regarding the patient's treatment regimen: The patient's diagnosis, treatment plan and drug doses, and organ/hematologic function Lab orders and baseline tests specific to treatment regimen  The treatment plan start date, drug sequencing, and pre-medications Prior authorization status  Patient's documented medication list, including drug-drug interaction screen and prescriptions for anti-emetics and supportive care specific to the treatment regimen The drug concentrations, fluid compatibility, administration routes, and timing of the medications to be used The patient's access for treatment and lifetime cumulative dose history, if applicable  The patient's medication allergies and previous infusion related reactions, if applicable   Changes made to treatment plan:  N/A  Follow up needed:  N/A   Marc [name], [place], 11/27/2021  2:24 PM

## 2021-12-01 ENCOUNTER — Other Ambulatory Visit: Payer: Self-pay

## 2021-12-01 ENCOUNTER — Other Ambulatory Visit: Payer: Self-pay | Admitting: Interventional Radiology

## 2021-12-05 ENCOUNTER — Inpatient Hospital Stay: Payer: 59 | Attending: Oncology

## 2021-12-05 ENCOUNTER — Inpatient Hospital Stay: Payer: 59

## 2021-12-05 ENCOUNTER — Other Ambulatory Visit: Payer: Self-pay

## 2021-12-05 VITALS — BP 143/83 | HR 73 | Temp 97.7°F | Resp 17 | Wt 242.0 lb

## 2021-12-05 DIAGNOSIS — C7989 Secondary malignant neoplasm of other specified sites: Secondary | ICD-10-CM | POA: Diagnosis not present

## 2021-12-05 DIAGNOSIS — Z5112 Encounter for antineoplastic immunotherapy: Secondary | ICD-10-CM | POA: Insufficient documentation

## 2021-12-05 DIAGNOSIS — C7800 Secondary malignant neoplasm of unspecified lung: Secondary | ICD-10-CM | POA: Diagnosis not present

## 2021-12-05 DIAGNOSIS — C642 Malignant neoplasm of left kidney, except renal pelvis: Secondary | ICD-10-CM | POA: Insufficient documentation

## 2021-12-05 LAB — CBC WITH DIFFERENTIAL (CANCER CENTER ONLY)
Abs Immature Granulocytes: 0.01 10*3/uL (ref 0.00–0.07)
Basophils Absolute: 0.1 10*3/uL (ref 0.0–0.1)
Basophils Relative: 1 %
Eosinophils Absolute: 0.5 10*3/uL (ref 0.0–0.5)
Eosinophils Relative: 7 %
HCT: 35.5 % — ABNORMAL LOW (ref 39.0–52.0)
Hemoglobin: 12 g/dL — ABNORMAL LOW (ref 13.0–17.0)
Immature Granulocytes: 0 %
Lymphocytes Relative: 32 %
Lymphs Abs: 2.2 10*3/uL (ref 0.7–4.0)
MCH: 30.2 pg (ref 26.0–34.0)
MCHC: 33.8 g/dL (ref 30.0–36.0)
MCV: 89.4 fL (ref 80.0–100.0)
Monocytes Absolute: 0.8 10*3/uL (ref 0.1–1.0)
Monocytes Relative: 12 %
Neutro Abs: 3.3 10*3/uL (ref 1.7–7.7)
Neutrophils Relative %: 48 %
Platelet Count: 278 10*3/uL (ref 150–400)
RBC: 3.97 MIL/uL — ABNORMAL LOW (ref 4.22–5.81)
RDW: 15.4 % (ref 11.5–15.5)
WBC Count: 6.8 10*3/uL (ref 4.0–10.5)
nRBC: 0 % (ref 0.0–0.2)

## 2021-12-05 LAB — CMP (CANCER CENTER ONLY)
ALT: 25 U/L (ref 0–44)
AST: 22 U/L (ref 15–41)
Albumin: 4.5 g/dL (ref 3.5–5.0)
Alkaline Phosphatase: 82 U/L (ref 38–126)
Anion gap: 5 (ref 5–15)
BUN: 18 mg/dL (ref 8–23)
CO2: 26 mmol/L (ref 22–32)
Calcium: 9.5 mg/dL (ref 8.9–10.3)
Chloride: 109 mmol/L (ref 98–111)
Creatinine: 1.17 mg/dL (ref 0.61–1.24)
GFR, Estimated: >60 mL/min (ref >60)
Glucose, Bld: 113 mg/dL — ABNORMAL HIGH (ref 70–99)
Potassium: 3.9 mmol/L (ref 3.5–5.1)
Sodium: 140 mmol/L (ref 135–145)
Total Bilirubin: 0.4 mg/dL (ref 0.3–1.2)
Total Protein: 7.3 g/dL (ref 6.5–8.1)

## 2021-12-05 LAB — TSH: TSH: <0.01 u[IU]/mL — ABNORMAL LOW (ref 0.350–4.500)

## 2021-12-05 MED ORDER — SODIUM CHLORIDE 0.9 % IV SOLN
200.0000 mg | Freq: Once | INTRAVENOUS | Status: AC
  Administered 2021-12-05: 200 mg via INTRAVENOUS
  Filled 2021-12-05: qty 200

## 2021-12-05 MED ORDER — SODIUM CHLORIDE 0.9 % IV SOLN: Freq: Once | INTRAVENOUS | Status: AC

## 2021-12-05 NOTE — Patient Instructions (Signed)
[facility]   Discharge Instructions: Thank you for choosing [facility] to provide your oncology and hematology care.   If you have a lab appointment with the Cancer Center, please go directly to the Cancer Center and check in at the registration area.   Wear comfortable clothing and clothing appropriate for easy access to any Portacath or PICC line.   We strive to give you quality time with your provider. You may need to reschedule your appointment if you arrive late (15 or more minutes).  Arriving late affects you and other patients whose appointments are after yours.  Also, if you miss three or more appointments without notifying the office, you may be dismissed from the clinic at the provider's discretion.      For prescription refill requests, have your pharmacy contact our office and allow 72 hours for refills to be completed.    Today you received the following chemotherapy and/or immunotherapy agents: Pembrolizumab (Keytruda)      To help prevent nausea and vomiting after your treatment, we encourage you to take your nausea medication as directed.  BELOW ARE SYMPTOMS THAT SHOULD BE REPORTED IMMEDIATELY: *FEVER GREATER THAN 100.4 F (38 C) OR HIGHER *CHILLS OR SWEATING *NAUSEA AND VOMITING THAT IS NOT CONTROLLED WITH YOUR NAUSEA MEDICATION *UNUSUAL SHORTNESS OF BREATH *UNUSUAL BRUISING OR BLEEDING *URINARY PROBLEMS (pain or burning when urinating, or frequent urination) *BOWEL PROBLEMS (unusual diarrhea, constipation, pain near the anus) TENDERNESS IN MOUTH AND THROAT WITH OR WITHOUT PRESENCE OF ULCERS (sore throat, sores in mouth, or a toothache) UNUSUAL RASH, SWELLING OR PAIN  UNUSUAL VAGINAL DISCHARGE OR ITCHING   Items with * indicate a potential emergency and should be followed up as soon as possible or go to the Emergency Department if any problems should occur.  Please show the CHEMOTHERAPY ALERT CARD or IMMUNOTHERAPY ALERT  CARD at check-in to the Emergency Department and triage nurse.  Should you have questions after your visit or need to cancel or reschedule your appointment, please contact [facility]  Dept: [phone]  and follow the prompts.  Office hours are 8:00 a.m. to 4:30 p.m. Monday - Friday. Please note that voicemails left after 4:00 p.m. may not be returned until the following business day.  We are closed weekends and major holidays. You have access to a nurse at all times for urgent questions. Please call the main number to the clinic Dept: [phone] and follow the prompts.   For any non-urgent questions, you may also contact your provider using MyChart. We now offer e-Visits for anyone 18 and older to request care online for non-urgent symptoms. For details visit [email or web address].   Also download the MyChart app! Go to the app store, search "MyChart", open the app, select [facility], and log in with your MyChart username and password.  Masks are optional in the cancer centers. If you would like for your care team to wear a mask while they are taking care of you, please let them know. You may have one support Masey Scheiber who is at least 64 years old accompany you for your appointments. 

## 2021-12-06 ENCOUNTER — Telehealth: Payer: Self-pay | Admitting: *Deleted

## 2021-12-06 LAB — T4: T4, Total: 6.3 ug/dL (ref 4.5–12.0)

## 2021-12-06 NOTE — Telephone Encounter (Signed)
-----   Message from Daphane Shepherd, RN sent at 12/05/2021  3:57 PM EDT ----- Regarding: First time Keytruda Pt of Dr Alen Blew, first time [place]. Did great, no signs of distress noted upon discharge

## 2021-12-06 NOTE — Telephone Encounter (Signed)
Called pt to see how he did with his treatment yest.  He reports doing well & denies any problems.  He knows his next appt & how to reach [place] if needed.

## 2021-12-09 ENCOUNTER — Other Ambulatory Visit: Payer: Self-pay | Admitting: Internal Medicine

## 2021-12-22 ENCOUNTER — Encounter: Payer: Self-pay | Admitting: Internal Medicine

## 2021-12-22 ENCOUNTER — Other Ambulatory Visit: Payer: Self-pay

## 2021-12-22 ENCOUNTER — Ambulatory Visit (INDEPENDENT_AMBULATORY_CARE_PROVIDER_SITE_OTHER): Payer: 59 | Admitting: Internal Medicine

## 2021-12-22 VITALS — BP 120/74 | HR 70 | Ht 67.99 in | Wt 248.0 lb

## 2021-12-22 DIAGNOSIS — E893 Postprocedural hypopituitarism: Secondary | ICD-10-CM | POA: Diagnosis not present

## 2021-12-22 DIAGNOSIS — R7989 Other specified abnormal findings of blood chemistry: Secondary | ICD-10-CM

## 2021-12-22 DIAGNOSIS — E2749 Other adrenocortical insufficiency: Secondary | ICD-10-CM

## 2021-12-22 DIAGNOSIS — E038 Other specified hypothyroidism: Secondary | ICD-10-CM | POA: Diagnosis not present

## 2021-12-22 LAB — BASIC METABOLIC PANEL
BUN: 22 mg/dL (ref 6–23)
CO2: 26 mEq/L (ref 19–32)
Calcium: 9.2 mg/dL (ref 8.4–10.5)
Chloride: 106 mEq/L (ref 96–112)
Creatinine, Ser: 1.27 mg/dL (ref 0.40–1.50)
GFR: 59.76 mL/min — ABNORMAL LOW (ref >60.00)
Glucose, Bld: 93 mg/dL (ref 70–99)
Potassium: 4.1 mEq/L (ref 3.5–5.1)
Sodium: 142 mEq/L (ref 135–145)

## 2021-12-22 LAB — T4, FREE: Free T4: 0.69 ng/dL (ref 0.60–1.60)

## 2021-12-22 LAB — TESTOSTERONE: Testosterone: 298.42 ng/dL — ABNORMAL LOW (ref 300.00–890.00)

## 2021-12-22 LAB — TSH: TSH: <0.01 u[IU]/mL (ref 0.35–5.50)

## 2021-12-22 LAB — CORTISOL: Cortisol, Plasma: 0 ug/dL

## 2021-12-22 MED ORDER — TESTOSTERONE 20.25 MG/1.25GM (1.62%) TD GEL: 1.0000 | TRANSDERMAL | 5 refills | Status: DC

## 2021-12-22 NOTE — Patient Instructions (Signed)
     ADRENAL INSUFFICIENCY SICK DAY RULES:  Should you face an extreme emotional or physical stress such as trauma, surgery or acute illness, this will require extra steroid coverage so that the body can meet that stress.   Without increasing the steroid dose you may experience severe weakness, headache, dizziness, nausea and vomiting and possibly a more serious deterioration in health.  Typically the dose of steroids will only need to be increased for a couple of days if you have an illness that is transient and managed in the community.   If you are unable to take/absorb an increased dose of steroids orally because of vomiting or diarrhea, you will urgently require steroid injections and should present to an Emergency Department.  The general advice for any serious illness is as follows: Double the normal daily steroid dose for up to 3 days if you have a temperature of more than 37.50C (99.9F) with signs of sickness, or severe emotional or physical distress Contact your primary care doctor and Endocrinologist if the illness worsens or it lasts for more than 3 days.  In cases of severe illness, urgent medical assistance should be promptly sought. If you experience vomiting/diarrhea or are unable to take steroids by mouth, please administer the Hydrocortisone injection kit and seek urgent medical help.

## 2021-12-22 NOTE — Progress Notes (Unsigned)
Name: Marc [name]  MRN/ DOB: [id number], [birth year]    Age/ Sex: 66 y.o., male    PCP: [name], PA   Reason for Endocrinology Evaluation: Low testosterone      Date of Initial Endocrinology Evaluation: 06/19/2021    HPI: Marc [name] is a 66 y.o. 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 The patient presented for initial endocrinology clinic visit on 06/19/2021 for consultative assistance with his Low testosterone .   Marc [name] was referred her for further evaluation of low testosterone and LH this was during work up for low libido. He denies erectile dysfunction but has noted decrease in spontaneous     Of note, he is S/P left nephrectomy due to [place] with Mets to the lungs and   He is undergoing radiation therapy 07/2021  He follows with Dr. Osker Mason for Oncology  Follows with Dr. Ninfa Linden for back pain and LE weakness   He has never been on testosterone in the past  Had [place] in 01/2021 followed by [facility], he felt terrible until he went on prednisone  which has helped   1/5th he saw ortho for leg weakness, PT worsened his condition . Steroid improved  his condition after taking them for  5 days , after that he noted decrease appetite, lost 40 lbs and feeling poorly again. Saw PCP 2/20th started on Prednisone 40 mg for 5 days.  He received another refill 3/3rd and has been taking 10 mg daily    He has no prostate cancer   No CAD  No FH of prostate cancer  He is an ex-smoke  No headaches    He was started on HC with undetectable ACTH < 5 pg/mL and low serum cortisol 0.2ug/dL 06/20/2021  He was started on LT-4 replacement with a FT4 at 0.36 ng/dL , TSH inappropriately normal 06/20/2021   His testosterone was low at 5 ng/dL( [phone]) He was started on Natesto nasal spray, this was cleared by his oncologist as the benefit outweighed the risk    He is s/p uncomplicated endoscopic endonasal  resection of the sellar tumor by Dr. Zada Finders on 11/08/2021, pathology report consistent with clear cell carcinoma  SUBJECTIVE:   Today (12/22/21): Marc [name] is here for follow-up on hypopituitarism.  He is s/p endoscopic endonasal resection of a sellar tumor on 11/08/2021 He was started on Keytruda on 12/05/2021 Q 3 months  He is scheduled for an MRI soon   Pt has been noted with weight gain  Has had occasional headaches but have been improving  Has sinus pressure issues  Denies constipation or diarrhea  Has noted visual improvement , up to date on eye exam   HOME ENDOCRINE MEDICATIONS: Hydrocortisone 10 mg, 1.5 tabs every morning and 1 tab in the afternoon Levothyroxine 50 mcg daily AndroGel 1 pump to each shoulder      HISTORY:  Past Medical History:  Past Medical History:  Diagnosis Date   Arthritis    Cancer ([place])    Kidney, Lung, Pituitary   Hyperlipidemia    Hypothyroidism    Past Surgical History:  Past Surgical History:  Procedure Laterality Date   CRANIOTOMY N/A 11/08/2021   Procedure: ENDOSCOPIC ENDONASAL RESECTION OF SELLAR MASS;  Surgeon: Judith Part, MD;  Location: [facility];  Service: Neurosurgery;  Laterality: N/A;  RM 20   IR RADIOLOGIST EVAL & MGMT  07/14/2021   IR RADIOLOGIST  EVAL & MGMT  07/27/2021   RADIOFREQUENCY ABLATION N/A 08/23/2021   Procedure: RENAL CRYO ABLATION;  Surgeon: Greggory Keen, MD;  Location: [facility];  Service: Anesthesiology;  Laterality: N/A;   right shoulder rotator cuff repair Right    ROBOT ASSISTED LAPAROSCOPIC NEPHRECTOMY Left 01/05/2020   Procedure: XI ROBOTIC ASSISTED LAPAROSCOPIC RADICAL NEPHRECTOMY;  Surgeon: Ceasar Mons, MD;  Location: [facility];  Service: Urology;  Laterality: Left;   TOTAL HIP ARTHROPLASTY Left 03/31/2020   Procedure: LEFT TOTAL HIP ARTHROPLASTY ANTERIOR APPROACH;  Surgeon: Mcarthur Rossetti, MD;  Location: [facility];  Service: Orthopedics;  Laterality: Left;   TRANSPHENOIDAL APPROACH  EXPOSURE N/A 11/08/2021   Procedure: TRANSPHENOIDAL APPROACH EXPOSURE;  Surgeon: Jerrell Belfast, MD;  Location: [facility];  Service: ENT;  Laterality: N/A;    Social History:  reports that he quit smoking about 6 years ago. His smoking use included cigarettes. He has never used smokeless tobacco. He reports current alcohol use. He reports that he does not use drugs. Family History: family history is not on file.   HOME MEDICATIONS: Allergies as of 12/22/2021   No Known Allergies      Medication List        Accurate as of December 22, 2021  6:49 AM. If you have any questions, ask your nurse or doctor.          aspirin EC 81 MG tablet Take 81 mg by mouth every evening. Swallow whole.   atorvastatin 10 MG tablet Commonly known as: LIPITOR Take 10 mg by mouth every evening.   cholecalciferol 25 MCG (1000 UNIT) tablet Commonly known as: VITAMIN D3 Take 1,000 Units by mouth every evening.   desmopressin 0.1 MG tablet Commonly known as: DDAVP Take 0.5 tablets (0.05 mg total) by mouth 2 (two) times daily.   FISH OIL PO Take 1 capsule by mouth every evening.   HYDROcodone-acetaminophen 5-325 MG tablet Commonly known as: Norco Take 1-2 tablets by mouth every 6 (six) hours as needed for moderate pain.   HYDROcodone-acetaminophen 5-325 MG tablet Commonly known as: NORCO/VICODIN Take 1 tablet by mouth every 4 (four) hours as needed for moderate pain.   hydrocortisone 10 MG tablet Commonly known as: CORTEF TAKE 1.5 TABLETS BY MOUTH EVERY MORNING AND 1 TABLET AT 4PM FOR PAIN   ibuprofen 200 MG tablet Commonly known as: ADVIL Take 400-600 mg by mouth every 6 (six) hours as needed for moderate pain.   IRON SLOW RELEASE PO Take 1 tablet by mouth every evening.   levothyroxine 75 MCG tablet Commonly known as: SYNTHROID Take 1 tablet (75 mcg total) by mouth daily.   multivitamin with minerals tablet Take 1 tablet by mouth every evening.   prochlorperazine 10 MG  tablet Commonly known as: COMPAZINE Take 1 tablet (10 mg total) by mouth every 6 (six) hours as needed for nausea or vomiting.   Testosterone 20.25 MG/1.25GM (1.62%) Gel Commonly known as: AndroGel Place 1 Pump onto the skin as directed. 1 pump to each shoulder daily   VITAMIN C PO Take 1 tablet by mouth every evening.   vitamin E 180 MG (400 UNITS) capsule Take 400 Units by mouth every evening.          REVIEW OF SYSTEMS: A comprehensive ROS was conducted with the patient and is negative except as per HPI    OBJECTIVE:  VS: There were no vitals taken for this visit.   Wt Readings from Last 3 Encounters:  12/05/21 242 lb (109.8 kg)  11/11/21 238 lb 1.6 oz (108 kg)  11/07/21 240 lb 4.8 oz (109 kg)   There is no height or weight on file to calculate BSA.   EXAM: General: Pt appears well and is in NAD  Neck: General: Supple without adenopathy. Thyroid: Thyroid size normal.  No goiter or nodules appreciated.   Chest :  NO glandular breast tissue   Lungs: Clear with good BS bilat with no rales, rhonchi, or wheezes  Heart: Auscultation: RRR.  Abdomen: Normoactive bowel sounds, soft, nontender, without masses or organomegaly palpable  Genital Exam:  Testicular volume 20 mL B/L   Extremities:  BL LE: Tace  pretibial edema  Mental Status: Judgment, insight: Intact Orientation: Oriented to time, place, and person Mood and affect: No depression, anxiety, or agitation     DATA REVIEWED:     Latest Reference Range & Units 08/16/21 13:17  Sodium 135 - 145 mmol/L 142  Potassium 3.5 - 5.1 mmol/L 4.0  Chloride 98 - 111 mmol/L 110  CO2 22 - 32 mmol/L 26  Glucose 70 - 99 mg/dL 104 (H)  BUN 8 - 23 mg/dL 16  Creatinine 0.61 - 1.24 mg/dL 1.03  Calcium 8.9 - 10.3 mg/dL 8.9  Anion gap 5 - 15  6  GFR, Estimated >60 mL/min >60    Latest Reference Range & Units 08/15/21 14:02  Testosterone 300.00 - 890.00 ng/dL 244.13 (L)      Latest Reference Range & Units 08/15/21 14:02   T4,Free(Direct) 0.60 - 1.60 ng/dL 0.67      Latest Reference Range & Units 06/20/21 08:05  Cortisol, Plasma ug/dL 0.2  LH 1.50 - 9.30 mIU/mL 0.24 (L)  Prolactin 2.0 - 18.0 ng/mL 20.0 (H)  Glucose 70 - 99 mg/dL 88  TSH 0.35 - 5.50 uIU/mL 1.18  T4,Free(Direct) 0.60 - 1.60 ng/dL 0.36 (L)  PSA 0.10 - 4.00 ng/mL 0.00 (L)    Latest Reference Range & Units 06/20/21 08:05  C206 ACTH 6 - 50 pg/mL <5 (L)  Cortisol, Plasma ug/dL 0.2     04/19/2021 Testosterone < 10 ng/dL  LH 0.4 mIU/ml ( 1.7-8.6) Tg 330 HDL 33 LDL 90  H/H 12.5/37.1    Brain MRI 06/08/2021   EXAM: MRI HEAD WITHOUT AND WITH CONTRAST   TECHNIQUE: Multiplanar, multiecho pulse sequences of the brain and surrounding structures were obtained without and with intravenous contrast.   CONTRAST:  18m GADAVIST GADOBUTROL 1 MMOL/ML IV SOLN   COMPARISON:  CT head 07/16/2013   FINDINGS: Brain: There is no evidence of acute intracranial hemorrhage, extra-axial fluid collection, or acute infarct.   Parenchymal volume is normal. The ventricles are normal in size. Scattered small foci of FLAIR signal abnormality in the subcortical and periventricular white matter likely reflects sequela of mild chronic white matter microangiopathy.   There is a homogeneously enhancing lesion in the sella measuring 1.3 cm cc by 1.2 cm AP by 1.0 cm TV. There is associated thickening of the pituitary infundibulum. The lesion extends into the suprasellar cistern but does not exert mass effect on the optic chiasm. This lesion was not definitely present on the CT head from 07/16/2013, though the sella is suboptimally evaluated due to thick slices.   There is no other abnormal enhancement. There is no mass effect or midline shift.   Vascular: Normal flow voids.   Skull and upper cervical spine: Normal marrow signal.   Sinuses/Orbits: The paranasal sinuses are clear. The globes and orbits are unremarkable.   Other: There is asymmetric  atrophy of the right parotid gland.   IMPRESSION: 1. Enhancing lesion in the sella measuring 1.3 cm x 1.2 cm x 1.0 cm demonstrates imaging features which would be in keeping with a pituitary macroadenoma; however, metastatic disease can not be excluded by imaging. Consider follow up in 2-3 months to assess for growth. 2. No other abnormal enhancement.    ASSESSMENT/PLAN/RECOMMENDATIONS:   Pituitary macroadenoma:   -This was found on brain MRI 05/2021 -Pt with new onset visual changes with loss of peripheral vision, pending full evaluation by ophthalmology - Will proceed with repeat MRI given new onset symptoms  - If there's a change , will refer to neurosurgery     2.  Secondary adrenal insufficiency:   -Pt to obtain a medical alert bracelet  - Clinically improving  -Sick day rules discussed with the patient   Medication Continue hydrocortisone 10 mg, 1.5 tabs every morning and 1 tab between 2-4 PM daily   3.  Low testosterone:   -This is secondary in nature due to low LH -Testosterone has been improving  - The decision to start him on testosterone therapy  due to undetectable  testosterone and the benefit outweighed the risk   Continue Natesto 2 sprays BID    4.  Secondary hypothyroidism:   -Free T4  has improved but its at the low normal - Will increase     Medication Stop levothyroxine 50 mcg daily Start Levothyroxine 75 mcg daily    Follow-up in 4 months      Signed electronically by: Mack Guise, MD  [facility] Endocrinology  [address] [place], [place] [address] Phone: [phone] FAX: [phone]   CC: [name], [address] [place] [address] Phone: [phone] Fax: [phone]   Return to Endocrinology clinic as below: Future Appointments  Date Time Provider [place]  12/22/2021 11:10 AM Olman Yono, Melanie Crazier, MD [facility] None   12/26/2021  8:45 AM [facility] LAB [facility] None  12/26/2021  9:15 AM [name], MD [facility] None  12/26/2021 10:30 AM [facility] INFUSION [facility] None  01/16/2022  8:00 AM [facility] LAB [facility] None  01/16/2022  8:30 AM [name], MD [facility] None  01/16/2022  9:45 AM [facility] INFUSION [facility] None

## 2021-12-23 ENCOUNTER — Other Ambulatory Visit: Payer: Self-pay

## 2021-12-24 MED ORDER — LEVOTHYROXINE SODIUM 88 MCG PO TABS: 88.0000 ug | ORAL_TABLET | Freq: Every day | ORAL | 3 refills | Status: DC

## 2021-12-26 ENCOUNTER — Inpatient Hospital Stay: Payer: 59

## 2021-12-26 ENCOUNTER — Other Ambulatory Visit: Payer: Self-pay | Admitting: Radiation Therapy

## 2021-12-26 ENCOUNTER — Inpatient Hospital Stay: Payer: 59 | Admitting: Oncology

## 2021-12-26 ENCOUNTER — Other Ambulatory Visit: Payer: Self-pay

## 2021-12-26 VITALS — BP 134/69 | HR 75 | Temp 97.8°F | Resp 18 | Ht 67.99 in | Wt 252.6 lb

## 2021-12-26 DIAGNOSIS — C642 Malignant neoplasm of left kidney, except renal pelvis: Secondary | ICD-10-CM | POA: Diagnosis not present

## 2021-12-26 DIAGNOSIS — Z5112 Encounter for antineoplastic immunotherapy: Secondary | ICD-10-CM | POA: Diagnosis not present

## 2021-12-26 LAB — CBC WITH DIFFERENTIAL (CANCER CENTER ONLY)
Abs Immature Granulocytes: 0.03 10*3/uL (ref 0.00–0.07)
Basophils Absolute: 0.1 10*3/uL (ref 0.0–0.1)
Basophils Relative: 1 %
Eosinophils Absolute: 0.5 10*3/uL (ref 0.0–0.5)
Eosinophils Relative: 6 %
HCT: 37.3 % — ABNORMAL LOW (ref 39.0–52.0)
Hemoglobin: 12.6 g/dL — ABNORMAL LOW (ref 13.0–17.0)
Immature Granulocytes: 0 %
Lymphocytes Relative: 38 %
Lymphs Abs: 3 10*3/uL (ref 0.7–4.0)
MCH: 30.3 pg (ref 26.0–34.0)
MCHC: 33.8 g/dL (ref 30.0–36.0)
MCV: 89.7 fL (ref 80.0–100.0)
Monocytes Absolute: 1 10*3/uL (ref 0.1–1.0)
Monocytes Relative: 13 %
Neutro Abs: 3.2 10*3/uL (ref 1.7–7.7)
Neutrophils Relative %: 42 %
Platelet Count: 270 10*3/uL (ref 150–400)
RBC: 4.16 MIL/uL — ABNORMAL LOW (ref 4.22–5.81)
RDW: 14.5 % (ref 11.5–15.5)
WBC Count: 7.7 10*3/uL (ref 4.0–10.5)
nRBC: 0 % (ref 0.0–0.2)

## 2021-12-26 LAB — CMP (CANCER CENTER ONLY)
ALT: 24 U/L (ref 0–44)
AST: 21 U/L (ref 15–41)
Albumin: 4.2 g/dL (ref 3.5–5.0)
Alkaline Phosphatase: 75 U/L (ref 38–126)
Anion gap: 6 (ref 5–15)
BUN: 20 mg/dL (ref 8–23)
CO2: 26 mmol/L (ref 22–32)
Calcium: 8.9 mg/dL (ref 8.9–10.3)
Chloride: 107 mmol/L (ref 98–111)
Creatinine: 1.19 mg/dL (ref 0.61–1.24)
GFR, Estimated: >60 mL/min (ref >60)
Glucose, Bld: 137 mg/dL — ABNORMAL HIGH (ref 70–99)
Potassium: 4 mmol/L (ref 3.5–5.1)
Sodium: 139 mmol/L (ref 135–145)
Total Bilirubin: 0.2 mg/dL — ABNORMAL LOW (ref 0.3–1.2)
Total Protein: 7.1 g/dL (ref 6.5–8.1)

## 2021-12-26 MED ORDER — SODIUM CHLORIDE 0.9 % IV SOLN: Freq: Once | INTRAVENOUS | Status: AC

## 2021-12-26 MED ORDER — SODIUM CHLORIDE 0.9 % IV SOLN
200.0000 mg | Freq: Once | INTRAVENOUS | Status: AC
  Administered 2021-12-26: 200 mg via INTRAVENOUS
  Filled 2021-12-26: qty 200

## 2021-12-26 NOTE — Progress Notes (Signed)
Hematology and Oncology Follow Up Visit  Marc [name] [id number] [birth year] 66 y.o. 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  He initially presented with left clear-cell T3a tumor in 2021.  He has currently no active disease.   Prior Therapy:  He underwent robotic assisted laparoscopic left radical nephrectomy by Dr. Lovena Neighbours on January 05, 2020.  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  The final pathological staging was T3aN0 grade 2 tumor.    He is status post radiation therapy to the pulmonary nodules completed March 2023.  He received 50 Gray in 5 fractions.  He is status post cryoablation to 2 lesions in the right kidney completed on Aug 23, 2021.  He is s/p endoscopic transsphenoidal pituitary resection completed by Dr. Wilburn Cornelia on November 08, 2021.  The final pathology showed clear-cell renal cell carcinoma.   Current therapy: Pembrolizumab 200 mg every 3 weeks started on Aug 04, 2021.  He is here for cycle 2 with the plan is to complete 17 cycles for 1 year for adjuvant purposes.  Interim History: [name] returns today for a follow-up visit.  Since the last visit, he completed his endoscopic pituitary resection and received the first cycle of P[place]bro without any complications.  He denies any nausea, vomiting or abdominal pain.  He denies any hospitalizations or illnesses.  He denies any skin rash or lesions.  He denies any changes in his bowel habits.  He remains on hormone supplements after his pituitary resection.     Medications: Reviewed without changes. Current Outpatient Medications  Medication Sig Dispense Refill   Ascorbic Acid (VITAMIN C PO) Take 1 tablet by mouth every evening.      aspirin EC 81 MG tablet Take 81 mg by mouth every evening. Swallow whole.     atorvastatin (LIPITOR) 10 MG tablet Take 10 mg by mouth every evening.     cholecalciferol (VITAMIN D3) 25 MCG (1000 UNIT) tablet Take 1,000 Units by mouth every evening.     desmopressin (DDAVP) 0.1 MG tablet Take 0.5 tablets (0.05 mg total) by mouth 2 (two) times daily. 60 tablet 2   Ferrous Sulfate (IRON SLOW RELEASE PO) Take 1 tablet by mouth every evening.     HYDROcodone-acetaminophen (NORCO) 5-325 MG tablet Take 1-2 tablets by mouth every 6 (six) hours as needed for moderate pain. (Patient not taking: Reported on 12/22/2021) 20 tablet 0   HYDROcodone-acetaminophen (NORCO/VICODIN) 5-325 MG tablet Take 1 tablet by mouth every 4 (four) hours as needed for moderate pain. (Patient not taking: Reported on 12/22/2021) 30 tablet 0   hydrocortisone (CORTEF) 10 MG tablet TAKE 1.5 TABLETS BY MOUTH EVERY MORNING AND 1 TABLET AT 4PM FOR PAIN 75 tablet 1   ibuprofen (ADVIL) 200 MG tablet Take 400-600 mg by mouth every 6 (six) hours as needed for moderate pain.     levothyroxine (SYNTHROID) 88 MCG tablet Take 1 tablet (88 mcg total) by mouth daily. 90 tablet 3   Multiple Vitamins-Minerals (MULTIVITAMIN WITH MINERALS) tablet Take 1 tablet by mouth every evening.     Omega-3 Fatty Acids (FISH OIL PO) Take 1 capsule by mouth every evening.     prochlorperazine (COMPAZINE) 10 MG tablet Take 1 tablet (10 mg total) by mouth every 6 (  six) hours as needed for nausea or vomiting. 30 tablet 0   Testosterone (ANDROGEL) 20.25 MG/1.25GM (1.62%) GEL Place 1 Pump onto the skin as directed. 1 pump to each shoulder daily 75 g 5   vitamin E 180 MG (400 UNITS) capsule Take 400 Units by mouth every evening.     No current facility-administered medications for this visit.     Allergies: No Known Allergies    Physical Exam:  Blood pressure 134/69, pulse 75, temperature 97.8 F (36.6 C), temperature source Temporal, resp. rate 18, height 5'  7.99" (1.727 m), weight 252 lb 9.6 oz (114.6 kg), SpO2 98 %.  ECOG: 1   General appearance: Comfortable appearing without any discomfort Head: Normocephalic without any trauma Oropharynx: Mucous membranes are moist and pink without any thrush or ulcers. Eyes: Pupils are equal and round reactive to light. Lymph nodes: No cervical, supraclavicular, inguinal or axillary lymphadenopathy.   Heart:regular rate and rhythm.  S1 and S2 without leg edema. Lung: Clear without any rhonchi or wheezes.  No dullness to percussion. Abdomin: Soft, nontender, nondistended with good bowel sounds.  No hepatosplenomegaly. Musculoskeletal: No joint deformity or effusion.  Full range of motion noted. Neurological: No deficits noted on motor, sensory and deep tendon reflex exam. Skin: No petechial rash or dryness.  Appeared moist.     Lab Results: Lab Results  Component Value Date   WBC 6.8 12/05/2021   HGB 12.0 (L) 12/05/2021   HCT 35.5 (L) 12/05/2021   MCV 89.4 12/05/2021   PLT 278 12/05/2021   PSA 0.00 (L) 06/20/2021     Chemistry      Component Value Date/Time   NA 142 12/22/2021 1142   K 4.1 12/22/2021 1142   CL 106 12/22/2021 1142   CO2 26 12/22/2021 1142   BUN 22 12/22/2021 1142   CREATININE 1.27 12/22/2021 1142   CREATININE 1.17 12/05/2021 1340      Component Value Date/Time   CALCIUM 9.2 12/22/2021 1142   ALKPHOS 82 12/05/2021 1340   AST 22 12/05/2021 1340   ALT 25 12/05/2021 1340   BILITOT 0.4 12/05/2021 1340         Impression and Plan:  36 year old with:  1.  Kidney cancer diagnosed in 2021.  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   He is currently receiving adjuvant Pembrolizumab given his stage IV NED disease to complete a year.  Complication associated with this treatment include nausea, fatigue and autoimmune  considerations were reiterated.  Alternative treatment option would be active surveillance and initiate systemic therapy upon relapse.  He is agreeable to continue and we will update his staging scan before the next visit.   2.  Right renal mass: He is currently on active surveillance after cryoablation.  3.  Pituitary tumor: He is status post surgical resection and found to have metastatic clear-cell renal cell carcinoma.  He is following with endocrinology for hormone replacement.  The role of radiation therapy as well as surveillance was discussed today.  He is scheduled to have a repeat MRI this week with Dr. Venetia Constable.  I will discuss with Dr. Tammi Klippel regarding additional radiation.  4.  Autoimmune complications: He has not experienced any at this time.  These include pneumonitis, colitis and thyroid disease.     5.  Follow-up: In 3 weeks for a follow-up visit.   30  minutes were dedicated  to this visit.  The time was spent on updating his disease status, treatment choices and outlining future plan of care discussion.    Zola Button, MD 9/26/20239:04 AM

## 2021-12-27 ENCOUNTER — Telehealth: Payer: Self-pay | Admitting: *Deleted

## 2021-12-27 LAB — PROLACTIN: Prolactin: <1 ng/mL — ABNORMAL LOW (ref 2.0–18.0)

## 2021-12-27 LAB — ACTH: C206 ACTH: <5 pg/mL — ABNORMAL LOW (ref 6–50)

## 2021-12-27 NOTE — Telephone Encounter (Signed)
PC to patient,informed him his CT scan has been authorized by his insurance & may be scheduled, Central Scheduling number given, patient states he will call to schedule this.

## 2022-01-01 ENCOUNTER — Other Ambulatory Visit: Payer: Self-pay | Admitting: Radiation Therapy

## 2022-01-01 ENCOUNTER — Inpatient Hospital Stay: Payer: 59 | Attending: Oncology

## 2022-01-01 DIAGNOSIS — C642 Malignant neoplasm of left kidney, except renal pelvis: Secondary | ICD-10-CM | POA: Insufficient documentation

## 2022-01-01 DIAGNOSIS — Z79899 Other long term (current) drug therapy: Secondary | ICD-10-CM | POA: Insufficient documentation

## 2022-01-01 DIAGNOSIS — C7931 Secondary malignant neoplasm of brain: Secondary | ICD-10-CM

## 2022-01-01 DIAGNOSIS — R918 Other nonspecific abnormal finding of lung field: Secondary | ICD-10-CM | POA: Insufficient documentation

## 2022-01-01 DIAGNOSIS — Z5112 Encounter for antineoplastic immunotherapy: Secondary | ICD-10-CM | POA: Insufficient documentation

## 2022-01-09 ENCOUNTER — Encounter (HOSPITAL_COMMUNITY): Payer: Self-pay

## 2022-01-09 ENCOUNTER — Ambulatory Visit (HOSPITAL_COMMUNITY): Payer: 59

## 2022-01-09 ENCOUNTER — Ambulatory Visit (HOSPITAL_COMMUNITY)
Admission: RE | Admit: 2022-01-09 | Discharge: 2022-01-09 | Disposition: A | Discharge status: Home / Self Care | Payer: 59 | Source: Ambulatory Visit | Attending: Oncology | Admitting: Oncology

## 2022-01-09 DIAGNOSIS — C642 Malignant neoplasm of left kidney, except renal pelvis: Secondary | ICD-10-CM | POA: Insufficient documentation

## 2022-01-09 MED ORDER — SODIUM CHLORIDE (PF) 0.9 % IJ SOLN
INTRAMUSCULAR | Status: AC
  Filled 2022-01-09: qty 50

## 2022-01-09 MED ORDER — IOHEXOL 300 MG/ML  SOLN
100.0000 mL | Freq: Once | INTRAMUSCULAR | Status: AC | PRN
  Administered 2022-01-09: 100 mL via INTRAVENOUS

## 2022-01-09 NOTE — Progress Notes (Signed)
Location/Histology of Brain Tumor: Pituitary  12/28/2021 Dr. Zada Finders MRI HEAD WITHOUT AND WITH CONTRAST  FINDINGS: Brain: Diffusion imaging does not show any acute or subacute infarction. No focal abnormality affects the brainstem or cerebellum. Cerebral hemispheres show mild chronic small-vessel ischemic change of the white matter as seen previously. No large vessel infarction. No intra-axial mass lesion, hemorrhage, hydrocephalus or extra-axial collection.  Pituitary adenoma with suprasellar extension as seen previously.  Previous measurements were reported as 1.3 cm transverse by 2.3 cm cephalo caudal by 1.3 cm AP. Measuring today on both exams using the same technique shows no interval growth. I measure cephalo caudal measurement at 1.9 cm, front to back measurement at 1.6 cm and right-to-left measurement at 1.2 cm.  Note that the patient was head interval ethmoidectomy since the prior study.  Vascular: Major vessels at the base of the brain show flow.  Skull and upper cervical spine: Negative  Sinuses/Orbits: Interval partial ethmoidectomy as noted above.  Other: None  IMPRESSION: Measuring using the same technique, I do not believe there has been any enlargement of the mass over the prior 2 studies. Measurements today are 1.9 cm cephalo caudal, 1.6 cm front to back and 1.2 cm right to left.  Interval ethmoidectomy.   09/08/2021 Dr. Kelton Pillar MR Brain with or without Contrast CLINICAL DATA:  Pituitary dysfunction  FINDINGS: Brain: No acute infarct, mass effect or extra-axial collection. No acute or chronic hemorrhage. There is multifocal hyperintense T2-weighted signal within the white matter. Parenchymal volume and CSF spaces are normal. The midline structures are normal.   Pituitary/Sella: Intra sellar/suprasellar mass measures 1.3 cm TV x 2.3 cm CC x 1.3 cm AP. Previous measurements are 1.4 x 1.8 x 1.2 cm.  There is up lifting of the infundibulum. There is slight  upward deviation of the optic chiasm with splaying of the optic nerves.  Normal cavernous sinus and cavernous internal carotid artery flow voids.   Vascular: Major flow voids are preserved.   Skull and upper cervical spine: Normal calvarium and skull base.  Visualized upper cervical spine and soft tissues are normal.   Sinuses/Orbits:No paranasal sinus fluid levels or advanced mucosal thickening. No mastoid or middle ear effusion. Normal orbits.   IMPRESSION: 1. Slight increase in size of pituitary macroadenoma, measuring 1.3 x 2.3 x 1.3 cm. Slight upward deviation of the optic chiasm and splaying of the optic nerves has worsened since the prior. 2. No acute intracranial abnormality.   06/08/2021 Dr. Alen Blew MR Brain with or without Contrast CLINICAL DATA:  History of kidney cancer  FINDINGS: Brain: There is no evidence of acute intracranial hemorrhage, extra-axial fluid collection, or acute infarct.   Parenchymal volume is normal. The ventricles are normal in size.  Scattered small foci of FLAIR signal abnormality in the subcortical and periventricular white matter likely reflects sequela of mild chronic white matter microangiopathy.   There is a homogeneously enhancing lesion in the sella measuring 1.3 cm cc by 1.2 cm AP by 1.0 cm TV. There is associated thickening of the pituitary infundibulum. The lesion extends into the suprasellar cistern but does not exert mass effect on the optic chiasm. This lesion was not definitely present on the CT head from 07/16/2013, though the sella is suboptimally evaluated due to thick slices.   There is no other abnormal enhancement. There is no mass effect or midline shift.   Vascular: Normal flow voids.   Skull and upper cervical spine: Normal marrow signal.   Sinuses/Orbits: The paranasal sinuses are clear.  The globes and orbits are unremarkable.   Other: There is asymmetric atrophy of the right parotid gland.   IMPRESSION: 1. Enhancing lesion  in the sella measuring 1.3 cm x 1.2 cm x 1.0 cm demonstrates imaging features which would be in keeping with a pituitary macroadenoma; however, metastatic disease can not be excluded by imaging. Consider follow up in 2-3 months to assess for growth. 2. No other abnormal enhancement.  Past or anticipated interventions, if any, per neurosurgery:     11/08/2021 Dr. Zada Finders Endonasal endoscopic resection of likely RCC metastasis to the sella  Past or anticipated interventions, if any, per medical oncology:   12/26/2021 Dr. Alen Blew Impression and Plan:   66 year old with:  1.  Kidney cancer diagnosed in 2021.  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   He is currently receiving adjuvant Pembrolizumab given his stage IV NED disease to complete a year.  Complication associated with this treatment include nausea, fatigue and autoimmune considerations were reiterated.  Alternative treatment option would be active surveillance and initiate systemic therapy upon relapse.  He is agreeable to continue and we will update his staging scan before the next visit.   2.  Right renal mass: He is currently on active surveillance after cryoablation.   3.  Pituitary tumor: He is status post surgical resection and found to have metastatic clear-cell renal cell carcinoma.  He is following with endocrinology for hormone replacement.   The role of radiation therapy as well as surveillance was discussed today.  He is scheduled to have a repeat MRI this week with Dr. Venetia Constable.  I will discuss with Dr. Tammi Klippel regarding additional radiation.   4.  Autoimmune complications: He has not experienced any at this time.  These include pneumonitis, colitis and thyroid disease.    5.  Follow-up: In 3 weeks for a follow-up visit.   Dose of Decadron, if applicable:  No  Recent neurologic symptoms, if any:  Seizures: No Headaches: Few after brain surgery not now. Nausea: No Dizziness/ataxia: No Difficulty with hand coordination: No Focal numbness/weakness: No Visual deficits/changes: No, vision is better since surgery Confusion/Memory deficits: No  Painful bone metastases at present, if any: 2/10 knee right  SAFETY ISSUES: Prior radiation?  Yes, 05/2021 Pacemaker/ICD? No Possible current pregnancy? Male Is the patient on methotrexate? No  Additional Complaints / other details: Dental and hip implants.

## 2022-01-11 ENCOUNTER — Other Ambulatory Visit: Payer: Self-pay

## 2022-01-11 ENCOUNTER — Ambulatory Visit
Admission: RE | Admit: 2022-01-11 | Discharge: 2022-01-11 | Disposition: A | Discharge status: Home / Self Care | Payer: 59 | Source: Ambulatory Visit | Attending: Radiation Oncology | Admitting: Radiation Oncology

## 2022-01-11 VITALS — BP 120/64 | HR 91 | Temp 97.9°F | Resp 20 | Ht 68.0 in | Wt 249.2 lb

## 2022-01-11 DIAGNOSIS — Z79890 Hormone replacement therapy (postmenopausal): Secondary | ICD-10-CM | POA: Insufficient documentation

## 2022-01-11 DIAGNOSIS — I700 Atherosclerosis of aorta: Secondary | ICD-10-CM | POA: Insufficient documentation

## 2022-01-11 DIAGNOSIS — C7931 Secondary malignant neoplasm of brain: Secondary | ICD-10-CM | POA: Insufficient documentation

## 2022-01-11 DIAGNOSIS — C7801 Secondary malignant neoplasm of right lung: Secondary | ICD-10-CM | POA: Insufficient documentation

## 2022-01-11 DIAGNOSIS — K5730 Diverticulosis of large intestine without perforation or abscess without bleeding: Secondary | ICD-10-CM | POA: Diagnosis not present

## 2022-01-11 DIAGNOSIS — Z79899 Other long term (current) drug therapy: Secondary | ICD-10-CM | POA: Insufficient documentation

## 2022-01-11 DIAGNOSIS — Z7982 Long term (current) use of aspirin: Secondary | ICD-10-CM | POA: Diagnosis not present

## 2022-01-11 DIAGNOSIS — I2510 Atherosclerotic heart disease of native coronary artery without angina pectoris: Secondary | ICD-10-CM | POA: Diagnosis not present

## 2022-01-11 DIAGNOSIS — C641 Malignant neoplasm of right kidney, except renal pelvis: Secondary | ICD-10-CM | POA: Diagnosis not present

## 2022-01-11 DIAGNOSIS — C642 Malignant neoplasm of left kidney, except renal pelvis: Secondary | ICD-10-CM

## 2022-01-11 NOTE — Progress Notes (Signed)
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 Further evaluation with CT AP on 11/16/19 confirmed a 10.5 cm left renal mass, consistent with renal cell carcinoma, abutting Marc [name]'s fascia without evidence of extension through the fascia. Additionally, there was a 10 mm left paraaortic lymph node and a 1.4 cm, indeterminate, lesion in the upper pole of the right kidney. He was referred to Marc [name] and subsequently underwent a robotic assisted laparoscopic radical left nephrectomy on 01/05/20. Final surgical pathology confirmed  a 10.7 cm, T3a, grade 2 clear cell renal cell carcinoma extending into the perinephric tissue but with negative surgical margins and no lymph node involvement.   He had been on active surveillance since the time of surgery and was noted to have some small pleural-based nodules on chest CT in 05/2020. On restaging CT chest in 05/2021 there was interval enlaregment of a pleural nodule, as well as a right upper lobe lung nodule. This was further evaluated with a PET scan on 05/25/21 that showed mild hypermetabolism of the pleural-based nodule along the right hemidiaphragm with SUV max of 2.4 and mild hypermetabolism in the RUL nodule along the minor fissure with SUV max of 0.7.  The substantial progression in size of both lesions over the last year raised suspicion for low-grade malignancy and  the known right renal mass also had an activity level similar to the surrounding renal parenchyma, concerning for renal cell carcinoma.   He met with Marc [name] in consultation on 06/01/21, who recommended a brain MRI to complete his staging and local therapy to the pulmonary nodules and right renal mass given his low volume of disease progression. Brain MRI was performed on 06/08/21 and showed a 1.3 cm enhancing lesion in the sella with imaging features in keeping with a pituitary macroadenoma, though metastatic disease could not be excluded by imaging. The recommendation was for repeat brain MRI in 2-3 months for close monitoring.  We initially met with the patient on 06/13/21 to discuss  potential radiotherapy options in the management of the pulmonary nodules and he was also referred to interventional radiology for further evaluation and treatment of the right renal lesion with biopsy and cryoablation. He completed SBRT x5 to the right lung nodules in 07/2021 which were tolerated well and has also had cryoablation to the right renal lesions with Dr. Annamaria Boots on 08/23/21.  He developed some visual changes and headaches in 08/2021 so a repeat MRI was performed and showed interval enlargement of the pituitary growth with slight upward deviation of the optic chiasm causing splaying of the optic nerves. Therefore, Marc [name] took him for surgical resection on 11/08/21, with the assistance of Marc [name]. The tumor was very fibrous, firm, and vascular, very consistent with an RCC over a normal pituitary adenoma making it difficult to resect. Frozen sample was sent intraoperatively and  returned suspicious for RCC. They were able to get all the way back to the diaphragma and continued the resection until the diaphragma fell down into the field. There was still gross tumor involving the diaphragma, but this did not seem resectable without a high chance of further CSF leak. The remnants were highly vascular and a set up for a  difficult reconstruction, so the resection was aborted after there was evidence of a good decompression of the optic apparatus. Final surgical pathology confirmed metastatic clear cell renal cell carcinoma. He has had dramatic improvement in his vision since the time of procedure which he is quite pleased with. His recent 6 week postop MRI brain scan on 12/28/21 showed residual tumor vs regrowth/recurrence so he has kindly been referred today to discuss the role of stereotactic radiosurgery [facility]) in the management of metastatic disease to the brain. He is on immunotherapy with Keytruda every 3 weeks, started 12/05/21 and is tolerating this well.                              On review of systems, the patient states that he is doing very well in general and is without complaints.  His vision is much improved and he currently denies any issues with double vision or blurry vision and is not having headaches. He reports a healthy appetite and is maintaining his weight.  He denies chest pain, shortness of breath, fever, chills or night sweats. Overall, he is quite pleased with his progress to date.    ALLERGIES:  has No Known Allergies.  Meds: Current Outpatient Medications  Medication Sig Dispense Refill   Ascorbic Acid (VITAMIN C PO) Take 1 tablet by mouth every evening.     aspirin EC 81 MG tablet Take 81 mg by mouth every evening. Swallow whole.     atorvastatin (LIPITOR) 10 MG tablet Take 10 mg by mouth every evening.     cholecalciferol (VITAMIN D3) 25 MCG (1000 UNIT) tablet Take 1,000 Units by mouth every evening.     desmopressin (DDAVP) 0.1 MG tablet Take 0.5 tablets (0.05 mg total) by mouth 2 (two) times daily. 60 tablet 2   Ferrous Sulfate (IRON SLOW RELEASE PO) Take 1 tablet by mouth every evening.     HYDROcodone-acetaminophen (NORCO) 5-325 MG tablet Take 1-2 tablets by mouth every 6 (six) hours as needed for moderate pain. (Patient not taking: Reported on 12/22/2021) 20 tablet 0    HYDROcodone-acetaminophen (NORCO/VICODIN) 5-325 MG tablet Take 1 tablet by mouth every 4 (four) hours as needed for moderate pain. (Patient not taking: Reported on 12/22/2021) 30 tablet 0   hydrocortisone (CORTEF) 10 MG tablet TAKE 1.5 TABLETS BY MOUTH EVERY MORNING AND 1 TABLET AT 4PM FOR PAIN 75 tablet 1   ibuprofen (ADVIL) 200 MG tablet Take 400-600 mg by mouth every 6 (six) hours as needed for moderate pain.     levothyroxine (SYNTHROID) 88 MCG tablet Take 1 tablet (88 mcg total) by mouth daily. 90 tablet 3   Multiple Vitamins-Minerals (MULTIVITAMIN WITH MINERALS) tablet Take 1 tablet by mouth every evening.     Omega-3 Fatty Acids (FISH OIL PO) Take 1 capsule by mouth every evening.     prochlorperazine (COMPAZINE) 10 MG tablet Take 1 tablet (10 mg total) by mouth every 6 (six) hours as needed for nausea or vomiting. 30 tablet 0   Testosterone (ANDROGEL) 20.25 MG/1.25GM (1.62%) GEL Place 1 Pump onto the skin as  directed. 1 pump to each shoulder daily 75 g 5   vitamin E 180 MG (400 UNITS) capsule Take 400 Units by mouth every evening.     No current facility-administered medications for this encounter.    Physical Findings:  height is '5\' 8"'  (1.727 m) and weight is 249 lb 3.2 oz (113 kg). His temperature is 97.9 F (36.6 C). His blood pressure is 120/64 and his pulse is 91. His respiration is 20 and oxygen saturation is 97%.  Pain Assessment Pain Score: 2  Pain Loc: Knee (right)/10 In general this is a well appearing Caucasian male in no acute distress. He's alert and oriented x4 and appropriate throughout the examination. Cardiopulmonary assessment is negative for acute distress and he exhibits normal effort.    Lab Findings: Lab Results  Component Value Date   WBC 7.7 12/26/2021   HGB 12.6 (L) 12/26/2021   HCT 37.3 (L) 12/26/2021   MCV 89.7 12/26/2021   PLT 270 12/26/2021     Radiographic Findings: CT Chest W Contrast  Result Date: 01/10/2022 CLINICAL DATA:  Left renal cell  carcinoma, status post left nephrectomy and adrenalectomy, right renal cell carcinoma status post percutaneous cryoablation, ongoing immunotherapy * Tracking Code: BO * EXAM: CT CHEST WITH CONTRAST CT ABDOMEN AND PELVIS WITH AND WITHOUT CONTRAST TECHNIQUE: Multidetector CT imaging of the chest was performed during intravenous contrast administration. Multidetector CT imaging of the abdomen and pelvis was performed following the standard protocol before and during bolus administration of intravenous contrast. RADIATION DOSE REDUCTION: This exam was performed according to the departmental dose-optimization program which includes automated exposure control, adjustment of the mA and/or kV according to patient size and/or use of iterative reconstruction technique. CONTRAST:  144m OMNIPAQUE IOHEXOL 300 MG/ML  SOLN COMPARISON:  MR abdomen, 07/24/2021, PET-CT, 05/25/2021, CT chest abdomen pelvis, 05/08/2021 FINDINGS: CT CHEST FINDINGS Cardiovascular: Aortic atherosclerosis. Normal heart size. Three-vessel coronary artery calcifications. No pericardial effusion. Mediastinum/Nodes: No enlarged mediastinal, hilar, or axillary lymph nodes. Thyroid gland, trachea, and esophagus demonstrate no significant findings. Lungs/Pleura: New, bandlike scarring and fibrosis of the inferior right upper lobe (series 15, image 83) as well as the inferior right lower lobe (series 15, image 111), which obscures previously seen small pulmonary nodules. There is a new, pleural-based nodule of the deep right azygoesophageal recess, measuring 3.5 x 1.9 cm (series 11, image 18). A tiny nodule of the right costophrenic recess is likewise new, measuring 0.4 cm (series 15, image 135). Slightly enlarged subpleural nodule of the anterior right upper lobe, measuring 0.5 cm, previously no greater than 0.3 cm (series 15, image 49). No pleural effusion or pneumothorax. Musculoskeletal: No chest wall abnormality. No acute osseous findings. CT ABDOMEN PELVIS  FINDINGS Hepatobiliary: No solid liver abnormality is seen. Tiny gallstones. No gallbladder wall thickening, or biliary dilatation. Pancreas: Unremarkable. No pancreatic ductal dilatation or surrounding inflammatory changes. Spleen: Normal in size without significant abnormality. Adrenals/Urinary Tract: Adrenal glands are unremarkable. Status post left nephrectomy and adrenalectomy. No suspicious soft tissue or contrast enhancement in the nephrectomy bed. Interval cryoablation of the posterior superior pole of the right kidney, without residual evidence of contrast enhancement (series 6, image 55). Cyst of the posterior midportion of the right kidney (series 11, image 119). Bladder is unremarkable. Stomach/Bowel: Stomach is within normal limits. Appendix appears normal. No evidence of bowel wall thickening, distention, or inflammatory changes. Descending and sigmoid diverticulosis Vascular/Lymphatic: Aortic atherosclerosis. No enlarged abdominal or pelvic lymph nodes. Reproductive: No mass or other abnormality. Other: No abdominal  wall hernia or abnormality. No ascites. Musculoskeletal: No acute osseous findings. Status post left hip total arthroplasty. IMPRESSION: 1. Interval cryoablation of the posterior superior pole of the right kidney, without residual evidence of contrast enhancement. 2. Status post left nephrectomy and adrenalectomy. No suspicious soft tissue or contrast enhancement in the nephrectomy bed. 3. New, pleural-based nodule of the deep right azygoesophageal recess, measuring 3.5 x 1.9 cm well as a 0.4 cm nodule of the right costophrenic recess. Slightly enlarged subpleural nodule of the anterior right upper lobe, measuring 0.5 cm, previously no greater than 0.3 cm. Findings are consistent with new and worsened pulmonary metastatic disease. 4. New, bandlike scarring and fibrosis of the inferior right upper lobe as well as the inferior right lower lobe, which obscures previously seen small pulmonary  nodules, consistent with interval radiation therapy and development of radiation fibrosis. 5. Coronary artery disease. 6. Cholelithiasis. 7. Descending and sigmoid diverticulosis. Aortic Atherosclerosis (ICD10-I70.0). Electronically Signed   By: Delanna Ahmadi M.D.   On: 01/10/2022 15:17   CT Abdomen Pelvis W Wo Contrast  Result Date: 01/10/2022 CLINICAL DATA:  Left renal cell carcinoma, status post left nephrectomy and adrenalectomy, right renal cell carcinoma status post percutaneous cryoablation, ongoing immunotherapy * Tracking Code: BO * EXAM: CT CHEST WITH CONTRAST CT ABDOMEN AND PELVIS WITH AND WITHOUT CONTRAST TECHNIQUE: Multidetector CT imaging of the chest was performed during intravenous contrast administration. Multidetector CT imaging of the abdomen and pelvis was performed following the standard protocol before and during bolus administration of intravenous contrast. RADIATION DOSE REDUCTION: This exam was performed according to the departmental dose-optimization program which includes automated exposure control, adjustment of the mA and/or kV according to patient size and/or use of iterative reconstruction technique. CONTRAST:  113m OMNIPAQUE IOHEXOL 300 MG/ML  SOLN COMPARISON:  MR abdomen, 07/24/2021, PET-CT, 05/25/2021, CT chest abdomen pelvis, 05/08/2021 FINDINGS: CT CHEST FINDINGS Cardiovascular: Aortic atherosclerosis. Normal heart size. Three-vessel coronary artery calcifications. No pericardial effusion. Mediastinum/Nodes: No enlarged mediastinal, hilar, or axillary lymph nodes. Thyroid gland, trachea, and esophagus demonstrate no significant findings. Lungs/Pleura: New, bandlike scarring and fibrosis of the inferior right upper lobe (series 15, image 83) as well as the inferior right lower lobe (series 15, image 111), which obscures previously seen small pulmonary nodules. There is a new, pleural-based nodule of the deep right azygoesophageal recess, measuring 3.5 x 1.9 cm (series 11,  image 18). A tiny nodule of the right costophrenic recess is likewise new, measuring 0.4 cm (series 15, image 135). Slightly enlarged subpleural nodule of the anterior right upper lobe, measuring 0.5 cm, previously no greater than 0.3 cm (series 15, image 49). No pleural effusion or pneumothorax. Musculoskeletal: No chest wall abnormality. No acute osseous findings. CT ABDOMEN PELVIS FINDINGS Hepatobiliary: No solid liver abnormality is seen. Tiny gallstones. No gallbladder wall thickening, or biliary dilatation. Pancreas: Unremarkable. No pancreatic ductal dilatation or surrounding inflammatory changes. Spleen: Normal in size without significant abnormality. Adrenals/Urinary Tract: Adrenal glands are unremarkable. Status post left nephrectomy and adrenalectomy. No suspicious soft tissue or contrast enhancement in the nephrectomy bed. Interval cryoablation of the posterior superior pole of the right kidney, without residual evidence of contrast enhancement (series 6, image 55). Cyst of the posterior midportion of the right kidney (series 11, image 119). Bladder is unremarkable. Stomach/Bowel: Stomach is within normal limits. Appendix appears normal. No evidence of bowel wall thickening, distention, or inflammatory changes. Descending and sigmoid diverticulosis Vascular/Lymphatic: Aortic atherosclerosis. No enlarged abdominal or pelvic lymph nodes. Reproductive: No mass or other  abnormality. Other: No abdominal wall hernia or abnormality. No ascites. Musculoskeletal: No acute osseous findings. Status post left hip total arthroplasty. IMPRESSION: 1. Interval cryoablation of the posterior superior pole of the right kidney, without residual evidence of contrast enhancement. 2. Status post left nephrectomy and adrenalectomy. No suspicious soft tissue or contrast enhancement in the nephrectomy bed. 3. New, pleural-based nodule of the deep right azygoesophageal recess, measuring 3.5 x 1.9 cm well as a 0.4 cm nodule of the  right costophrenic recess. Slightly enlarged subpleural nodule of the anterior right upper lobe, measuring 0.5 cm, previously no greater than 0.3 cm. Findings are consistent with new and worsened pulmonary metastatic disease. 4. New, bandlike scarring and fibrosis of the inferior right upper lobe as well as the inferior right lower lobe, which obscures previously seen small pulmonary nodules, consistent with interval radiation therapy and development of radiation fibrosis. 5. Coronary artery disease. 6. Cholelithiasis. 7. Descending and sigmoid diverticulosis. Aortic Atherosclerosis (ICD10-I70.0). Electronically Signed   By: Delanna Ahmadi M.D.   On: 01/10/2022 15:17    Impression/Plan: 23. 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 Today, we talked to the patient and his wife about the findings and workup thus far. We discussed the natural history of metastatic renal cell carcinoma and general treatment, highlighting the role of postoperative radiotherapy in the management. We discussed the available radiation techniques, and focused on the details and logistics of delivery. Given the turmor's close proximity to the optic chiasm, the recommendation is for a 5 fraction course of SRS. We reviewed the anticipated acute and late sequelae associated with radiation in this setting. The patient was encouraged to ask questions that were answered to his stated satisfaction.  At the conclusion of our conversation, the patient is in agreement to proceed with the recommended 5 fraction course of SRT to 25 Gy in 5 fractions. He has freely signed written consent to proceed today in the office and a copy of this document will be placed in his medical record. He is scheduled for a 3T SRS protocol treatment planning MRI brain scan at 1pm on 01/17/22, prior to his CT Simulation at 3pm, in anticipation of proceeding with his first treatment on 01/19/22, coordinated with Marc [name] who will  participate in the planning and delivery of the [place] treatments. We enjoyed meeting with him again today and look forward to continuing to participate in his care. He and his wife know that they are welcome to call with any additional questions or concerns related to the radiation treatment plan.    Nicholos Johns, PA-C    Tyler Pita, MD  [facility] Direct Dial: [phone]  Fax: [phone] [email or web address]  Skype  [email or web address]

## 2022-01-15 ENCOUNTER — Telehealth: Payer: Self-pay | Admitting: Oncology

## 2022-01-15 ENCOUNTER — Other Ambulatory Visit: Payer: Self-pay | Admitting: Interventional Radiology

## 2022-01-15 DIAGNOSIS — N2889 Other specified disorders of kidney and ureter: Secondary | ICD-10-CM

## 2022-01-15 NOTE — Telephone Encounter (Signed)
Called patient regarding upcoming October and November appointments, patient is notified.  

## 2022-01-16 ENCOUNTER — Other Ambulatory Visit: Payer: Self-pay

## 2022-01-16 ENCOUNTER — Inpatient Hospital Stay: Payer: 59

## 2022-01-16 ENCOUNTER — Inpatient Hospital Stay (HOSPITAL_BASED_OUTPATIENT_CLINIC_OR_DEPARTMENT_OTHER): Payer: 59 | Admitting: Oncology

## 2022-01-16 VITALS — BP 118/84 | HR 84 | Temp 97.8°F | Resp 19 | Ht 68.0 in | Wt 250.8 lb

## 2022-01-16 DIAGNOSIS — C642 Malignant neoplasm of left kidney, except renal pelvis: Secondary | ICD-10-CM

## 2022-01-16 DIAGNOSIS — Z79899 Other long term (current) drug therapy: Secondary | ICD-10-CM | POA: Diagnosis not present

## 2022-01-16 DIAGNOSIS — Z5112 Encounter for antineoplastic immunotherapy: Secondary | ICD-10-CM | POA: Diagnosis not present

## 2022-01-16 DIAGNOSIS — R918 Other nonspecific abnormal finding of lung field: Secondary | ICD-10-CM | POA: Diagnosis not present

## 2022-01-16 LAB — CMP (CANCER CENTER ONLY)
ALT: 28 U/L (ref 0–44)
AST: 25 U/L (ref 15–41)
Albumin: 4.2 g/dL (ref 3.5–5.0)
Alkaline Phosphatase: 85 U/L (ref 38–126)
Anion gap: 8 (ref 5–15)
BUN: 18 mg/dL (ref 8–23)
CO2: 25 mmol/L (ref 22–32)
Calcium: 9 mg/dL (ref 8.9–10.3)
Chloride: 107 mmol/L (ref 98–111)
Creatinine: 1.31 mg/dL — ABNORMAL HIGH (ref 0.61–1.24)
GFR, Estimated: >60 mL/min (ref >60)
Glucose, Bld: 105 mg/dL — ABNORMAL HIGH (ref 70–99)
Potassium: 4.1 mmol/L (ref 3.5–5.1)
Sodium: 140 mmol/L (ref 135–145)
Total Bilirubin: 0.3 mg/dL (ref 0.3–1.2)
Total Protein: 7.1 g/dL (ref 6.5–8.1)

## 2022-01-16 LAB — CBC WITH DIFFERENTIAL (CANCER CENTER ONLY)
Abs Immature Granulocytes: 0.01 10*3/uL (ref 0.00–0.07)
Basophils Absolute: 0.1 10*3/uL (ref 0.0–0.1)
Basophils Relative: 1 %
Eosinophils Absolute: 0.5 10*3/uL (ref 0.0–0.5)
Eosinophils Relative: 7 %
HCT: 38.8 % — ABNORMAL LOW (ref 39.0–52.0)
Hemoglobin: 13.2 g/dL (ref 13.0–17.0)
Immature Granulocytes: 0 %
Lymphocytes Relative: 39 %
Lymphs Abs: 2.8 10*3/uL (ref 0.7–4.0)
MCH: 29.9 pg (ref 26.0–34.0)
MCHC: 34 g/dL (ref 30.0–36.0)
MCV: 87.8 fL (ref 80.0–100.0)
Monocytes Absolute: 0.9 10*3/uL (ref 0.1–1.0)
Monocytes Relative: 12 %
Neutro Abs: 2.9 10*3/uL (ref 1.7–7.7)
Neutrophils Relative %: 41 %
Platelet Count: 250 10*3/uL (ref 150–400)
RBC: 4.42 MIL/uL (ref 4.22–5.81)
RDW: 13.7 % (ref 11.5–15.5)
WBC Count: 7 10*3/uL (ref 4.0–10.5)
nRBC: 0 % (ref 0.0–0.2)

## 2022-01-16 MED ORDER — SODIUM CHLORIDE 0.9 % IV SOLN
200.0000 mg | Freq: Once | INTRAVENOUS | Status: AC
  Administered 2022-01-16: 200 mg via INTRAVENOUS
  Filled 2022-01-16: qty 200

## 2022-01-16 MED ORDER — SODIUM CHLORIDE 0.9 % IV SOLN: Freq: Once | INTRAVENOUS | Status: AC

## 2022-01-16 NOTE — Progress Notes (Signed)
Per Dr. Alen Blew ok to proceed with D1C3 keytruda without TSH being drawn.

## 2022-01-16 NOTE — Progress Notes (Signed)
Hematology and Oncology Follow Up Visit  Marc [name] [id number] [birth year] 66 y.o. 01/16/2022 7:59 AM [name], Marc [name], Marc [name], Marc [name]   Principle Diagnosis: 66 year old with kidney cancer diagnosed in 2021.  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     Prior Therapy:  He underwent robotic assisted laparoscopic left radical nephrectomy by Dr. Lovena [name] on January 05, 2020.  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  The final pathological staging was T3aN0 grade 2 tumor.    He is status post radiation therapy to the pulmonary nodules completed March 2023.  He received 50 Gray in 5 fractions.  He is status post cryoablation to 2 lesions in the right kidney completed on Aug 23, 2021.  He is s/p endoscopic transsphenoidal pituitary resection completed by Dr. Wilburn [name] on November 08, 2021.  The final pathology showed clear-cell renal cell carcinoma.   Current therapy:   Pembrolizumab 200 mg every 3 weeks started on Aug 04, 2021.  He is here for cycle 3 out of planned 17.  SRS treatment to pituitary bed scheduled to start this week.  Interim History: [name] is here for a repeat evaluation.  Since the last visit, he was evaluated by Dr. Tammi [name] and felt to be a candidate for [place] of the pituitary given possible residual disease.  He denies any nausea, vomiting or abdominal pain.  He denies any skin rashes or lesions.  His performance status quality of life remains unchanged.     Medications: Updated on review. Current Outpatient Medications  Medication Sig Dispense Refill   Ascorbic Acid (VITAMIN C PO) Take 1 tablet by mouth every evening.     aspirin EC 81 MG tablet Take 81 mg by mouth every evening. Swallow whole.     atorvastatin (LIPITOR) 10 MG tablet Take 10 mg by mouth every evening.      cholecalciferol (VITAMIN D3) 25 MCG (1000 UNIT) tablet Take 1,000 Units by mouth every evening.     desmopressin (DDAVP) 0.1 MG tablet Take 0.5 tablets (0.05 mg total) by mouth 2 (two) times daily. 60 tablet 2   Ferrous Sulfate (IRON SLOW RELEASE PO) Take 1 tablet by mouth every evening.     HYDROcodone-acetaminophen (NORCO) 5-325 MG tablet Take 1-2 tablets by mouth every 6 (six) hours as needed for moderate pain. (Patient not taking: Reported on 12/22/2021) 20 tablet 0   HYDROcodone-acetaminophen (NORCO/VICODIN) 5-325 MG tablet Take 1 tablet by mouth every 4 (four) hours as needed for moderate pain. (Patient not taking: Reported on 12/22/2021) 30 tablet 0   hydrocortisone (CORTEF) 10 MG tablet TAKE 1.5 TABLETS BY MOUTH EVERY MORNING AND 1 TABLET AT 4PM FOR PAIN 75 tablet 1   ibuprofen (ADVIL) 200 MG tablet Take 400-600 mg by mouth every 6 (six) hours as needed for moderate pain.     levothyroxine (SYNTHROID) 88 MCG tablet Take 1 tablet (88 mcg total) by mouth daily. 90 tablet 3   Multiple Vitamins-Minerals (MULTIVITAMIN WITH MINERALS) tablet Take 1 tablet by mouth every evening.     Omega-3 Fatty Acids (FISH OIL PO) Take 1 capsule by mouth every evening.     prochlorperazine (COMPAZINE) 10 MG tablet Take 1 tablet (10 mg total) by mouth every 6 (six) hours as needed for nausea or vomiting. 30 tablet 0   Testosterone (ANDROGEL) 20.25 MG/1.25GM (1.62%) GEL  Place 1 Pump onto the skin as directed. 1 pump to each shoulder daily 75 g 5   vitamin E 180 MG (400 UNITS) capsule Take 400 Units by mouth every evening.     No current facility-administered medications for this visit.     Allergies: No Known Allergies    Physical Exam: Blood pressure 118/84, pulse 84, temperature 97.8 F (36.6 C), temperature source Temporal, resp. rate 19, height '5\' 8"'$  (1.727 m), weight 250 lb 12.8 oz (113.8 kg), SpO2 98 %.    ECOG: 1    General appearance: Alert, awake without any distress. Head: Atraumatic without  abnormalities Oropharynx: Without any thrush or ulcers. Eyes: No scleral icterus. Lymph nodes: No lymphadenopathy noted in the cervical, supraclavicular, or axillary nodes Heart:regular rate and rhythm, without any murmurs or gallops.   Lung: Clear to auscultation without any rhonchi, wheezes or dullness to percussion. Abdomin: Soft, nontender without any shifting dullness or ascites. Musculoskeletal: No clubbing or cyanosis. Neurological: No motor or sensory deficits. Skin: No rashes or lesions.     Lab Results: Lab Results  Component Value Date   WBC 7.7 12/26/2021   HGB 12.6 (L) 12/26/2021   HCT 37.3 (L) 12/26/2021   MCV 89.7 12/26/2021   PLT 270 12/26/2021   PSA 0.00 (L) 06/20/2021     Chemistry      Component Value Date/Time   NA 139 12/26/2021 0854   K 4.0 12/26/2021 0854   CL 107 12/26/2021 0854   CO2 26 12/26/2021 0854   BUN 20 12/26/2021 0854   CREATININE 1.19 12/26/2021 0854      Component Value Date/Time   CALCIUM 8.9 12/26/2021 0854   ALKPHOS 75 12/26/2021 0854   AST 21 12/26/2021 0854   ALT 24 12/26/2021 0854   BILITOT 0.2 (L) 12/26/2021 0854         Impression and Plan:  66 year old with:  1. Stage IV clear-cell renal cell carcinoma with isolated pulmonary nodules as well as pituitary involvement diagnosed in February 2023.    He has no active disease and currently on Pembrolizumab for adjuvant purposes.  Risks and benefits of receiving this treatment were discussed at this time.  Complications that include autoimmune considerations, GI toxicity as well as dermatological issues were reiterated.  CT scan obtained on January 09, 2022 showed a enlarging pulmonary nodules which likely has developed before his current therapy.  Alternative treatment options would include adding oral targeted therapy such as axitinib versus combination immunotherapy with ipilimumab and nivolumab.  Alternative option would be radiation therapy to the pulmonary nodule if he  does not respond to single agent immunotherapy.  After discussion today, we opted to continue with Pembrolizumab and repeat imaging studies in 3 months.   2.  Right renal mass: He received a cryoablation previously and currently under surveillance without any relapse disease.  3.  Pituitary tumor: He is currently receiving adjuvant radiation therapy after surgical resection.    4.  Autoimmune complications: I continue to educate him about these complications including pneumonitis, colitis and thyroid disease.     5.  Follow-up: He will return in 3 weeks for a follow-up.   30  minutes were spent on this encounter.  The time was dedicated to updating his disease status, treatment choices and outlining future plan of care discussion.    Zola Button, MD 10/17/20237:59 AM

## 2022-01-16 NOTE — Patient Instructions (Signed)
[facility] MEDICAL [facility]COLOGY   Discharge Instructions: Thank you for choosing [facility] to provide your oncology and hematology care.   If you have a lab appointment with the Cancer Center, please go directly to the Cancer Center and check in at the registration area.   Wear comfortable clothing and clothing appropriate for easy access to any Portacath or PICC line.   We strive to give you quality time with your provider. You may need to reschedule your appointment if you arrive late (15 or more minutes).  Arriving late affects you and other patients whose appointments are after yours.  Also, if you miss three or more appointments without notifying the office, you may be dismissed from the clinic at the provider's discretion.      For prescription refill requests, have your pharmacy contact our office and allow 72 hours for refills to be completed.    Today you received the following chemotherapy and/or immunotherapy agents: Pembrolizumab (Keytruda)      To help prevent nausea and vomiting after your treatment, we encourage you to take your nausea medication as directed.  BELOW ARE SYMPTOMS THAT SHOULD BE REPORTED IMMEDIATELY: *FEVER GREATER THAN 100.4 F (38 C) OR HIGHER *CHILLS OR SWEATING *NAUSEA AND VOMITING THAT IS NOT CONTROLLED WITH YOUR NAUSEA MEDICATION *UNUSUAL SHORTNESS OF BREATH *UNUSUAL BRUISING OR BLEEDING *URINARY PROBLEMS (pain or burning when urinating, or frequent urination) *BOWEL PROBLEMS (unusual diarrhea, constipation, pain near the anus) TENDERNESS IN MOUTH AND THROAT WITH OR WITHOUT PRESENCE OF ULCERS (sore throat, sores in mouth, or a toothache) UNUSUAL RASH, SWELLING OR PAIN  UNUSUAL VAGINAL DISCHARGE OR ITCHING   Items with * indicate a potential emergency and should be followed up as soon as possible or go to the Emergency Department if any problems should occur.  Please show the CHEMOTHERAPY ALERT CARD or IMMUNOTHERAPY ALERT  CARD at check-in to the Emergency Department and triage nurse.  Should you have questions after your visit or need to cancel or reschedule your appointment, please contact [facility]  Dept: [phone]  and follow the prompts.  Office hours are 8:00 a.m. to 4:30 p.m. Monday - Friday. Please note that voicemails left after 4:00 p.m. may not be returned until the following business day.  We are closed weekends and major holidays. You have access to a nurse at all times for urgent questions. Please call the main number to the clinic Dept: [phone] and follow the prompts.   For any non-urgent questions, you may also contact your provider using MyChart. We now offer e-Visits for anyone 18 and older to request care online for non-urgent symptoms. For details visit [email or web address].   Also download the MyChart app! Go to the app store, search "MyChart", open the app, select [facility], and log in with your MyChart username and password.  Masks are optional in the cancer centers. If you would like for your care team to wear a mask while they are taking care of you, please let them know. You may have one support person who is at least 64 years old accompany you for your appointments. 

## 2022-01-17 ENCOUNTER — Ambulatory Visit
Admission: RE | Admit: 2022-01-17 | Discharge: 2022-01-17 | Disposition: A | Discharge status: Home / Self Care | Payer: 59 | Source: Ambulatory Visit | Attending: Radiation Oncology | Admitting: Radiation Oncology

## 2022-01-17 DIAGNOSIS — C7931 Secondary malignant neoplasm of brain: Secondary | ICD-10-CM

## 2022-01-17 DIAGNOSIS — C642 Malignant neoplasm of left kidney, except renal pelvis: Secondary | ICD-10-CM | POA: Insufficient documentation

## 2022-01-17 DIAGNOSIS — Z510 Encounter for antineoplastic radiation therapy: Secondary | ICD-10-CM | POA: Diagnosis present

## 2022-01-17 LAB — T4: T4, Total: 6.6 ug/dL (ref 4.5–12.0)

## 2022-01-17 MED ORDER — GADOPICLENOL 0.5 MMOL/ML IV SOLN
10.0000 mL | Freq: Once | INTRAVENOUS | Status: AC | PRN
  Administered 2022-01-17: 10 mL via INTRAVENOUS

## 2022-01-17 NOTE — Progress Notes (Signed)
  Radiation Oncology         [phone] ________________________________  Name: Marc [name] MRN: [id number]  Date: 01/17/2022  DOB: 1955-09-04  SIMULATION AND TREATMENT PLANNING NOTE    ICD-10-CM   1. Metastasis to Pituitary [facility])  C79.31       DIAGNOSIS:  66 yo man with metastatic renal cell carcinoma to the brain/pituitary  NARRATIVE:  The patient was brought to the [facility].  Identity was confirmed.  All relevant records and images related to the planned course of therapy were reviewed.  The patient freely provided informed written consent to proceed with treatment after reviewing the details related to the planned course of therapy. The consent form was witnessed and verified by the simulation staff. Intravenous access was established for contrast administration. Then, the patient was set-up in a stable reproducible supine position for radiation therapy.  A relocatable thermoplastic stereotactic head frame was fabricated for precise immobilization.  CT images were obtained.  Surface markings were placed.  The CT images were loaded into the planning software and fused with the patient's targeting MRI scan.  Then the target and avoidance structures were contoured.  Treatment planning then occurred.  The radiation prescription was entered and confirmed.  I have requested 3D planning  I have requested a DVH of the following structures: Brain stem, brain, left eye, right eye, lenses, optic chiasm, target volumes, uninvolved brain, and normal tissue.    SPECIAL TREATMENT PROCEDURE:  The planned course of therapy using radiation constitutes a special treatment procedure. Special care is required in the management of this patient for the following reasons. This treatment constitutes a Special Treatment Procedure for the following reason: High dose per fraction requiring special monitoring for increased toxicities of treatment including daily imaging.  The special nature of the  planned course of radiotherapy will require increased physician supervision and oversight to ensure patient's safety with optimal treatment outcomes.  This requires extended time and effort.  PLAN:  The patient will receive 25 Gy in 5 fractions.  ________________________________  Sheral Apley Tammi Klippel, M.D.

## 2022-01-18 DIAGNOSIS — Z510 Encounter for antineoplastic radiation therapy: Secondary | ICD-10-CM | POA: Diagnosis not present

## 2022-01-19 ENCOUNTER — Ambulatory Visit
Admission: RE | Admit: 2022-01-19 | Discharge: 2022-01-19 | Disposition: A | Discharge status: Home / Self Care | Payer: 59 | Source: Ambulatory Visit | Attending: Radiation Oncology | Admitting: Radiation Oncology

## 2022-01-19 ENCOUNTER — Other Ambulatory Visit: Payer: Self-pay

## 2022-01-19 VITALS — BP 124/79 | HR 79 | Temp 97.5°F | Resp 20

## 2022-01-19 DIAGNOSIS — C7931 Secondary malignant neoplasm of brain: Secondary | ICD-10-CM

## 2022-01-19 DIAGNOSIS — Z510 Encounter for antineoplastic radiation therapy: Secondary | ICD-10-CM | POA: Diagnosis not present

## 2022-01-19 LAB — RAD ONC ARIA SESSION SUMMARY
Course Elapsed Days: 0
Plan Fractions Treated to Date: 1
Plan Prescribed Dose Per Fraction: 5 Gy
Plan Total Fractions Prescribed: 5
Plan Total Prescribed Dose: 25 Gy
Reference Point Dosage Given to Date: 5 Gy
Reference Point Session Dosage Given: 5 Gy
Session Number: 1

## 2022-01-19 NOTE — Progress Notes (Signed)
Patient to nursing for observation [facility].  Reports mild headache but he had before treatment today will take OTC when get home.  Denies fatigue, vision changes, ringing in ears, nausea, and skin irritation.  Speech clear and gait independent without assistive devices.  No decadron.   Vitals:  97.5-79-20-124/79. O2 sat 98% room air.

## 2022-01-19 NOTE — Progress Notes (Signed)
  Radiation Oncology         [phone] ________________________________  Stereotactic Treatment Procedure Note  Name: Marc [name] MRN: [id number]  Date: 01/19/2022  DOB: [birth year]  SPECIAL TREATMENT PROCEDURE    ICD-10-CM   1. Metastasis to Pituitary [facility])  C79.31       3D TREATMENT PLANNING AND DOSIMETRY:  The patient's radiation plan was reviewed and approved by neurosurgery and radiation oncology prior to treatment.  It showed 3-dimensional radiation distributions overlaid onto the planning CT/MRI image set.  The [facility] for the target structures as well as the organs at risk were reviewed. The documentation of the 3D plan and dosimetry are filed in the radiation oncology EMR.  NARRATIVE:  Marc [name] was brought to the TrueBeam stereotactic radiation treatment machine and placed supine on the CT couch. The head frame was applied, and the patient was set up for stereotactic radiosurgery.  Neurosurgery was present for the set-up and delivery  SIMULATION VERIFICATION:  In the couch zero-angle position, the patient underwent Exactrac imaging using the Brainlab system with orthogonal KV images.  These were carefully aligned and repeated to confirm treatment position for each of the isocenters.  The Exactrac snap film verification was repeated at each couch angle.  PROCEDURE: Marc [name] received stereotactic radiosurgery to the following targets:  The 15 mm pituitary metastasis target was treated using 5 Rapid Arc VMAT Beams to a fractional prescription dose of 5 Gy to be repeated for a total of 5 fractions to 25 Gy.  ExacTrac registration was performed for each couch angle.  The 100% isodose line was prescribed.  6 MV X-rays were delivered in the flattening filter free beam mode.  STEREOTACTIC TREATMENT MANAGEMENT:  Following delivery, the patient was transported to nursing in stable condition and monitored for possible acute effects.  Vital signs were recorded BP 124/79 (BP  Location: Right Arm, Patient Position: Sitting, Cuff Size: Large)   Pulse 79   Temp (!) 97.5 F (36.4 C)   Resp 20   SpO2 98% . The patient tolerated treatment without significant acute effects, and was discharged to home in stable condition.    PLAN: Complete 5 fractions and then follow-up in one month.  ________________________________  Sheral Apley. Tammi Klippel, M.D.

## 2022-01-22 ENCOUNTER — Ambulatory Visit
Admission: RE | Admit: 2022-01-22 | Discharge: 2022-01-22 | Disposition: A | Discharge status: Home / Self Care | Payer: 59 | Source: Ambulatory Visit | Attending: Radiation Oncology | Admitting: Radiation Oncology

## 2022-01-22 ENCOUNTER — Other Ambulatory Visit: Payer: Self-pay

## 2022-01-22 DIAGNOSIS — C7931 Secondary malignant neoplasm of brain: Secondary | ICD-10-CM

## 2022-01-22 DIAGNOSIS — Z510 Encounter for antineoplastic radiation therapy: Secondary | ICD-10-CM | POA: Diagnosis not present

## 2022-01-22 LAB — RAD ONC ARIA SESSION SUMMARY
Course Elapsed Days: 3
Plan Fractions Treated to Date: 1
Plan Prescribed Dose Per Fraction: 5 Gy
Plan Total Fractions Prescribed: 4
Plan Total Prescribed Dose: 20 Gy
Reference Point Dosage Given to Date: 10 Gy
Reference Point Session Dosage Given: 5 Gy
Session Number: 2

## 2022-01-22 NOTE — Progress Notes (Signed)
Patient to nursing for 15 minute observation [facility].  Denies headache, fatigue, visual changes, ringing in the ears, nausea, and no skin irritation.  Speech clear.  Gait steady and without mobility assistance needed.  Advised not to do anything strenuous for 24 hours and to call [phone] with any questions or concerns.  Vitals:  97.5-67-122/74 O2 sat 100%.

## 2022-01-22 NOTE — Op Note (Signed)
  Name: Marc [name]  MRN: [id number]  Date: 01/19/2022   DOB: [birth year]  Stereotactic Radiosurgery Operative Note  PRE-OPERATIVE DIAGNOSIS:  Suprasellar metastatic clear cell carcinoma  POST-OPERATIVE DIAGNOSIS:  Same  PROCEDURE:  Stereotactic Radiosurgery  SURGEON:  Judith Part, MD  NARRATIVE: The patient underwent a radiation treatment planning session in the radiation oncology simulation suite under the care of the radiation oncology physician and physicist.  I participated closely in the radiation treatment planning afterwards. The patient underwent planning CT which was fused to 3T high resolution MRI with 1 mm axial slices.  These images were fused on the planning system.  We contoured the gross target volumes and subsequently expanded this to yield the Planning Target Volume. I actively participated in the planning process.  I helped to define and review the target contours and also the contours of the optic pathway, eyes, brainstem and selected nearby organs at risk.  All the dose constraints for critical structures were reviewed and compared to AAPM Task Group 101.  The prescription dose conformity was reviewed.  I approved the plan electronically.    Accordingly, [name] was brought to the TrueBeam stereotactic radiation treatment linac and placed in the custom immobilization mask.  The patient was aligned according to the IR fiducial markers with BrainLab Exactrac, then orthogonal x-rays were used in ExacTrac with the 6DOF robotic table and the shifts were made to align the patient  [name] received stereotactic radiosurgery uneventfully.    Lesions treated:  1   Complex lesions treated:  1 (>3.5 cm, <48m of optic path, or within the brainstem)   The detailed description of the procedure is recorded in the radiation oncology procedure note.  I was present for the duration of the procedure.  DISPOSITION:  Following delivery, the patient was transported to  nursing in stable condition and monitored for possible acute effects to be discharged to home in stable condition with follow-up in one month.  TJudith Part MD 01/22/2022 2:54 PM

## 2022-01-22 NOTE — Addendum Note (Signed)
Encounter addended by: Judith Part, MD on: 01/22/2022 2:55 PM  Actions taken: Clinical Note Signed

## 2022-01-24 ENCOUNTER — Other Ambulatory Visit: Payer: Self-pay

## 2022-01-24 ENCOUNTER — Ambulatory Visit
Admission: RE | Admit: 2022-01-24 | Discharge: 2022-01-24 | Disposition: A | Discharge status: Home / Self Care | Payer: 59 | Source: Ambulatory Visit | Attending: Radiation Oncology | Admitting: Radiation Oncology

## 2022-01-24 DIAGNOSIS — C7931 Secondary malignant neoplasm of brain: Secondary | ICD-10-CM

## 2022-01-24 DIAGNOSIS — Z510 Encounter for antineoplastic radiation therapy: Secondary | ICD-10-CM | POA: Diagnosis not present

## 2022-01-24 LAB — RAD ONC ARIA SESSION SUMMARY
Course Elapsed Days: 5
Plan Fractions Treated to Date: 2
Plan Prescribed Dose Per Fraction: 5 Gy
Plan Total Fractions Prescribed: 4
Plan Total Prescribed Dose: 20 Gy
Reference Point Dosage Given to Date: 15 Gy
Reference Point Session Dosage Given: 5 Gy
Session Number: 3

## 2022-01-24 NOTE — Progress Notes (Signed)
Patient to nursing for 15 minutes observation [facility] [facility].  Denies headache, fatigue, visual changes, ringing in the ears, nausea, and no skin irritation.  Speech clear.  Gait is independent without mobility devices needed.  Advised not to do anything strenuous for the next 24 hours and to call [phone] with any questions or concerns.  Vitals: 97.5-63-18-123/79 O2 sat 100% on room air.

## 2022-01-26 ENCOUNTER — Ambulatory Visit
Admission: RE | Admit: 2022-01-26 | Discharge: 2022-01-26 | Disposition: A | Discharge status: Home / Self Care | Payer: 59 | Source: Ambulatory Visit | Attending: Radiation Oncology | Admitting: Radiation Oncology

## 2022-01-26 ENCOUNTER — Other Ambulatory Visit: Payer: Self-pay

## 2022-01-26 DIAGNOSIS — Z510 Encounter for antineoplastic radiation therapy: Secondary | ICD-10-CM | POA: Diagnosis not present

## 2022-01-26 DIAGNOSIS — C7931 Secondary malignant neoplasm of brain: Secondary | ICD-10-CM

## 2022-01-26 LAB — RAD ONC ARIA SESSION SUMMARY
Course Elapsed Days: 7
Plan Fractions Treated to Date: 3
Plan Prescribed Dose Per Fraction: 5 Gy
Plan Total Fractions Prescribed: 4
Plan Total Prescribed Dose: 20 Gy
Reference Point Dosage Given to Date: 20 Gy
Reference Point Session Dosage Given: 5 Gy
Session Number: 4

## 2022-01-26 NOTE — Progress Notes (Signed)
Patient to nursing for 15 minute observation [place] [facility].  Denies headache, fatigue, visual changes, ringing in the ears, nausea, and no skin irritation.  Speech clear.  Gait is independent without mobility devices needed.  Advised not to do anything strenuous for 24 hours.  Vitals:  97.7-82-18-140/82 O2 sat 96%.

## 2022-01-29 ENCOUNTER — Ambulatory Visit
Admission: RE | Admit: 2022-01-29 | Discharge: 2022-01-29 | Disposition: A | Discharge status: Home / Self Care | Payer: 59 | Source: Ambulatory Visit | Attending: Radiation Oncology | Admitting: Radiation Oncology

## 2022-01-29 ENCOUNTER — Other Ambulatory Visit: Payer: Self-pay

## 2022-01-29 ENCOUNTER — Encounter: Payer: Self-pay | Admitting: Oncology

## 2022-01-29 ENCOUNTER — Encounter: Payer: Self-pay | Admitting: Urology

## 2022-01-29 VITALS — BP 121/80 | HR 70 | Temp 97.4°F | Resp 20

## 2022-01-29 DIAGNOSIS — C7931 Secondary malignant neoplasm of brain: Secondary | ICD-10-CM

## 2022-01-29 DIAGNOSIS — Z510 Encounter for antineoplastic radiation therapy: Secondary | ICD-10-CM | POA: Diagnosis not present

## 2022-01-29 LAB — RAD ONC ARIA SESSION SUMMARY
Course Elapsed Days: 10
Plan Fractions Treated to Date: 4
Plan Prescribed Dose Per Fraction: 5 Gy
Plan Total Fractions Prescribed: 4
Plan Total Prescribed Dose: 20 Gy
Reference Point Dosage Given to Date: 25 Gy
Reference Point Session Dosage Given: 5 Gy
Session Number: 5

## 2022-01-29 NOTE — Progress Notes (Signed)
Patient to nursing for 15 minutes observation [place] [facility] completed 5/5 treatments.  Denies headache, fatigue, visual changes, ringing in the ears, nausea, and no skin irritation.  Speech remains clear.  Independently ambulating without difficulty.  Will inform patient once order for Decadron taper is prescribed.  Vitals:  97.4-72-20-121/80 O2 sat @ 100% room air.  Advised not to do anything strenuous for 24 hours and to call [phone] if have any question or concerns.

## 2022-01-30 ENCOUNTER — Encounter: Payer: Self-pay | Admitting: *Deleted

## 2022-01-30 ENCOUNTER — Ambulatory Visit
Admission: RE | Admit: 2022-01-30 | Discharge: 2022-01-30 | Disposition: A | Discharge status: Home / Self Care | Payer: 59 | Source: Ambulatory Visit | Attending: Interventional Radiology | Admitting: Interventional Radiology

## 2022-01-30 DIAGNOSIS — N2889 Other specified disorders of kidney and ureter: Secondary | ICD-10-CM

## 2022-01-30 HISTORY — PX: IR RADIOLOGIST EVAL & MGMT: IMG5224

## 2022-01-30 NOTE — Progress Notes (Signed)
Patient ID: Marc [name], male   DOB: [birth year], 66 y.o.   MRN: [id number]       Chief Complaint:  Metastatic renal cell carcinoma  Referring Physician(s): Dr. Alen Blew  History of Present Illness: Marc [name] is a 66 y.o. male with known metastatic renal cell carcinoma.  In review, he is status post left nephrectomy for a large left renal cell carcinoma back in October 2021.  Pathology of the left renal carcinoma confirmed clear-cell renal carcinoma.  Surveillance imaging confirmed metastatic disease with 2 enlarging right pulmonary nodules which were treated with SBRT.  He also was found to have 2 suspicious right renal cortical masses in the upper pole 1 medial and 1 posterior.  These lesions showed slow enlargement and were confirmed to be solid and enhancing by MRI compatible with contralateral renal neoplasms versus metastatic disease.  These underwent successful image guided cryoablation performed at [facility] 08/23/2021.  Since the cryoablation, he has also had resection of an enlarging pituitary mass.  This also required postoperative radiation therapy which she has completed.  He remains on immunotherapy for the metastatic renal cell carcinoma.  Interval surveillance CTs confirm stable ablation changes of the right kidney upper pole.  No signs of residual or recurrent disease.  No suspicious marginal or nodular enhancement at the ablation site.  No new metastatic disease in the abdomen or pelvis.  Of note, he does have some progression of metastatic disease in the right chest.  Please see the CT reports.  Hopefully this will respond to the immunotherapy.  Currently he is recovering from his last radiation treatment following the pituitary mass resection.  Past Medical History:  Diagnosis Date   Arthritis    Cancer ([place])    Kidney, Lung, Pituitary   Hyperlipidemia    Hypothyroidism     Past Surgical History:  Procedure Laterality Date   CRANIOTOMY N/A  11/08/2021   Procedure: ENDOSCOPIC ENDONASAL RESECTION OF SELLAR MASS;  Surgeon: Judith Part, MD;  Location: [facility];  Service: Neurosurgery;  Laterality: N/A;  RM 20   IR RADIOLOGIST EVAL & MGMT  07/14/2021   IR RADIOLOGIST EVAL & MGMT  07/27/2021   RADIOFREQUENCY ABLATION N/A 08/23/2021   Procedure: RENAL CRYO ABLATION;  Surgeon: Greggory Keen, MD;  Location: [facility];  Service: Anesthesiology;  Laterality: N/A;   right shoulder rotator cuff repair Right    ROBOT ASSISTED LAPAROSCOPIC NEPHRECTOMY Left 01/05/2020   Procedure: XI ROBOTIC ASSISTED LAPAROSCOPIC RADICAL NEPHRECTOMY;  Surgeon: Ceasar Mons, MD;  Location: [facility];  Service: Urology;  Laterality: Left;   TOTAL HIP ARTHROPLASTY Left 03/31/2020   Procedure: LEFT TOTAL HIP ARTHROPLASTY ANTERIOR APPROACH;  Surgeon: Mcarthur Rossetti, MD;  Location: [facility];  Service: Orthopedics;  Laterality: Left;   TRANSPHENOIDAL APPROACH EXPOSURE N/A 11/08/2021   Procedure: TRANSPHENOIDAL APPROACH EXPOSURE;  Surgeon: Jerrell Belfast, MD;  Location: [facility];  Service: ENT;  Laterality: N/A;    Allergies: Patient has no known allergies.  Medications: Prior to Admission medications   Medication Sig Start Date End Date Taking? Authorizing Provider  Ascorbic Acid (VITAMIN C PO) Take 1 tablet by mouth every evening.    [provider]  aspirin EC 81 MG tablet Take 81 mg by mouth every evening. Swallow whole.    [provider]  atorvastatin (LIPITOR) 10 MG tablet Take 10 mg by mouth every evening.    [provider]  cholecalciferol (VITAMIN D3) 25 MCG (1000 UNIT) tablet Take 1,000 Units by  mouth every evening.    [provider]  desmopressin (DDAVP) 0.1 MG tablet Take 0.5 tablets (0.05 mg total) by mouth 2 (two) times daily. 11/11/21   Judith Part, MD  Ferrous Sulfate (IRON SLOW RELEASE PO) Take 1 tablet by mouth every evening.    [provider]  HYDROcodone-acetaminophen (NORCO) 5-325  MG tablet Take 1-2 tablets by mouth every 6 (six) hours as needed for moderate pain. Patient not taking: Reported on 12/22/2021 08/23/21   Allred, Darrell K, PA-C  HYDROcodone-acetaminophen (NORCO/VICODIN) 5-325 MG tablet Take 1 tablet by mouth every 4 (four) hours as needed for moderate pain. Patient not taking: Reported on 12/22/2021 11/11/21   Judith Part, MD  hydrocortisone (CORTEF) 10 MG tablet TAKE 1.5 TABLETS BY MOUTH EVERY MORNING AND 1 TABLET AT 4PM FOR PAIN 12/11/21   Shamleffer, Melanie Crazier, MD  ibuprofen (ADVIL) 200 MG tablet Take 400-600 mg by mouth every 6 (six) hours as needed for moderate pain.    [provider]  levothyroxine (SYNTHROID) 88 MCG tablet Take 1 tablet (88 mcg total) by mouth daily. 12/24/21   Shamleffer, Melanie Crazier, MD  Multiple Vitamins-Minerals (MULTIVITAMIN WITH MINERALS) tablet Take 1 tablet by mouth every evening.    [provider]  Omega-3 Fatty Acids (FISH OIL PO) Take 1 capsule by mouth every evening.    [provider]  prochlorperazine (COMPAZINE) 10 MG tablet Take 1 tablet (10 mg total) by mouth every 6 (six) hours as needed for nausea or vomiting. 08/31/21   [name], MD  Testosterone (ANDROGEL) 20.25 MG/1.25GM (1.62%) GEL Place 1 Pump onto the skin as directed. 1 pump to each shoulder daily 12/22/21   Shamleffer, Melanie Crazier, MD  vitamin E 180 MG (400 UNITS) capsule Take 400 Units by mouth every evening.    [provider]     No family history on file.  Social History   Socioeconomic History   Marital status: Single    Spouse name: Not on file   Number of children: 2   Years of education: Not on file   Highest education level: Not on file  Occupational History   Not on file  Tobacco Use   Smoking status: Former    Years: 30.00    Types: Cigarettes    Quit date: 04/03/2015    Years since quitting: 6.8   Smokeless tobacco: Never  Vaping Use   Vaping Use: Former   Quit date: 04/16/2021    Substances: Nicotine, Flavoring  Substance and Sexual Activity   Alcohol use: Yes    Comment: occas    Drug use: No   Sexual activity: Not on file  Other Topics Concern   Not on file  Social History Narrative   Not on file   Social Determinants of Health   Financial Resource Strain: Not on file  Food Insecurity: Not on file  Transportation Needs: Not on file  Physical Activity: Not on file  Stress: Not on file  Social Connections: Not on file      Review of Systems  Review of Systems: A 12 point ROS discussed and pertinent positives are indicated in the HPI above.  All other systems are negative.    Physical Exam No direct physical exam was performed telephone health visit only today to review surveillance imaging Vital Signs: There were no vitals taken for this visit.  Imaging: MR Brain W Wo Contrast  Result Date: 01/17/2022 CLINICAL DATA:  Brain metastases. Status post surgical  resection of metastatic renal tumor involving the pituitary. EXAM: MRI HEAD WITHOUT AND WITH CONTRAST TECHNIQUE: Multiplanar, multiecho pulse sequences of the brain and surrounding structures were obtained without and with intravenous contrast. CONTRAST:  10 ccVueway COMPARISON:  09/08/2021.  06/08/2021. FINDINGS: Brain: No change in appearance of the brain itself. Mild chronic small-vessel ischemic change of the white matter. No evidence of recent infarction. No sign of metastatic lesion to the brain parenchyma itself or leptomeninges. Interval trans-sphenoidal mass resection of a sellar to suprasellar metastasis. Considerable debulking of the lesion. There continues to be enhancing tissue in the sellar and suprasellar region measuring approximately 15 x 13 x 14 mm, with continued suprasellar extension along the stalk, consistent with residual tumor. There is also enhancement of tissue extending anteriorly from the postoperative sella which could represent patch material or anterior spread of tumor  from the sella along the planum sphenoidale. Vascular: Major vessels at the base of the brain show flow. Skull and upper cervical spine: Otherwise negative Sinuses/Orbits: Sinuses otherwise clear.  Orbits negative. Other: None IMPRESSION: Interval trans-sphenoidal mass resection of a sellar to suprasellar metastasis. Considerable debulking of the lesion. Residual enhancing tissue in the sellar and suprasellar region measuring 15 x 13 x 14 mm, with suprasellar extension along the stalk, consistent with residual tumor. Enhancement of tissue extending anteriorly from the postoperative sella could represent patch material or anterior spread of tumor from the sella along the planum sphenoidale. Electronically Signed   By: Nelson Chimes M.D.   On: 01/17/2022 15:41   CT Chest W Contrast  Result Date: 01/10/2022 CLINICAL DATA:  Left renal cell carcinoma, status post left nephrectomy and adrenalectomy, right renal cell carcinoma status post percutaneous cryoablation, ongoing immunotherapy * Tracking Code: BO * EXAM: CT CHEST WITH CONTRAST CT ABDOMEN AND PELVIS WITH AND WITHOUT CONTRAST TECHNIQUE: Multidetector CT imaging of the chest was performed during intravenous contrast administration. Multidetector CT imaging of the abdomen and pelvis was performed following the standard protocol before and during bolus administration of intravenous contrast. RADIATION DOSE REDUCTION: This exam was performed according to the departmental dose-optimization program which includes automated exposure control, adjustment of the mA and/or kV according to patient size and/or use of iterative reconstruction technique. CONTRAST:  120m OMNIPAQUE IOHEXOL 300 MG/ML  SOLN COMPARISON:  MR abdomen, 07/24/2021, PET-CT, 05/25/2021, CT chest abdomen pelvis, 05/08/2021 FINDINGS: CT CHEST FINDINGS Cardiovascular: Aortic atherosclerosis. Normal heart size. Three-vessel coronary artery calcifications. No pericardial effusion. Mediastinum/Nodes: No  enlarged mediastinal, hilar, or axillary lymph nodes. Thyroid gland, trachea, and esophagus demonstrate no significant findings. Lungs/Pleura: New, bandlike scarring and fibrosis of the inferior right upper lobe (series 15, image 83) as well as the inferior right lower lobe (series 15, image 111), which obscures previously seen small pulmonary nodules. There is a new, pleural-based nodule of the deep right azygoesophageal recess, measuring 3.5 x 1.9 cm (series 11, image 18). A tiny nodule of the right costophrenic recess is likewise new, measuring 0.4 cm (series 15, image 135). Slightly enlarged subpleural nodule of the anterior right upper lobe, measuring 0.5 cm, previously no greater than 0.3 cm (series 15, image 49). No pleural effusion or pneumothorax. Musculoskeletal: No chest wall abnormality. No acute osseous findings. CT ABDOMEN PELVIS FINDINGS Hepatobiliary: No solid liver abnormality is seen. Tiny gallstones. No gallbladder wall thickening, or biliary dilatation. Pancreas: Unremarkable. No pancreatic ductal dilatation or surrounding inflammatory changes. Spleen: Normal in size without significant abnormality. Adrenals/Urinary Tract: Adrenal glands are unremarkable. Status post left nephrectomy and adrenalectomy. No  suspicious soft tissue or contrast enhancement in the nephrectomy bed. Interval cryoablation of the posterior superior pole of the right kidney, without residual evidence of contrast enhancement (series 6, image 55). Cyst of the posterior midportion of the right kidney (series 11, image 119). Bladder is unremarkable. Stomach/Bowel: Stomach is within normal limits. Appendix appears normal. No evidence of bowel wall thickening, distention, or inflammatory changes. Descending and sigmoid diverticulosis Vascular/Lymphatic: Aortic atherosclerosis. No enlarged abdominal or pelvic lymph nodes. Reproductive: No mass or other abnormality. Other: No abdominal wall hernia or abnormality. No ascites.  Musculoskeletal: No acute osseous findings. Status post left hip total arthroplasty. IMPRESSION: 1. Interval cryoablation of the posterior superior pole of the right kidney, without residual evidence of contrast enhancement. 2. Status post left nephrectomy and adrenalectomy. No suspicious soft tissue or contrast enhancement in the nephrectomy bed. 3. New, pleural-based nodule of the deep right azygoesophageal recess, measuring 3.5 x 1.9 cm well as a 0.4 cm nodule of the right costophrenic recess. Slightly enlarged subpleural nodule of the anterior right upper lobe, measuring 0.5 cm, previously no greater than 0.3 cm. Findings are consistent with new and worsened pulmonary metastatic disease. 4. New, bandlike scarring and fibrosis of the inferior right upper lobe as well as the inferior right lower lobe, which obscures previously seen small pulmonary nodules, consistent with interval radiation therapy and development of radiation fibrosis. 5. Coronary artery disease. 6. Cholelithiasis. 7. Descending and sigmoid diverticulosis. Aortic Atherosclerosis (ICD10-I70.0). Electronically Signed   By: Delanna Ahmadi M.D.   On: 01/10/2022 15:17   CT Abdomen Pelvis W Wo Contrast  Result Date: 01/10/2022 CLINICAL DATA:  Left renal cell carcinoma, status post left nephrectomy and adrenalectomy, right renal cell carcinoma status post percutaneous cryoablation, ongoing immunotherapy * Tracking Code: BO * EXAM: CT CHEST WITH CONTRAST CT ABDOMEN AND PELVIS WITH AND WITHOUT CONTRAST TECHNIQUE: Multidetector CT imaging of the chest was performed during intravenous contrast administration. Multidetector CT imaging of the abdomen and pelvis was performed following the standard protocol before and during bolus administration of intravenous contrast. RADIATION DOSE REDUCTION: This exam was performed according to the departmental dose-optimization program which includes automated exposure control, adjustment of the mA and/or kV according  to patient size and/or use of iterative reconstruction technique. CONTRAST:  15m OMNIPAQUE IOHEXOL 300 MG/ML  SOLN COMPARISON:  MR abdomen, 07/24/2021, PET-CT, 05/25/2021, CT chest abdomen pelvis, 05/08/2021 FINDINGS: CT CHEST FINDINGS Cardiovascular: Aortic atherosclerosis. Normal heart size. Three-vessel coronary artery calcifications. No pericardial effusion. Mediastinum/Nodes: No enlarged mediastinal, hilar, or axillary lymph nodes. Thyroid gland, trachea, and esophagus demonstrate no significant findings. Lungs/Pleura: New, bandlike scarring and fibrosis of the inferior right upper lobe (series 15, image 83) as well as the inferior right lower lobe (series 15, image 111), which obscures previously seen small pulmonary nodules. There is a new, pleural-based nodule of the deep right azygoesophageal recess, measuring 3.5 x 1.9 cm (series 11, image 18). A tiny nodule of the right costophrenic recess is likewise new, measuring 0.4 cm (series 15, image 135). Slightly enlarged subpleural nodule of the anterior right upper lobe, measuring 0.5 cm, previously no greater than 0.3 cm (series 15, image 49). No pleural effusion or pneumothorax. Musculoskeletal: No chest wall abnormality. No acute osseous findings. CT ABDOMEN PELVIS FINDINGS Hepatobiliary: No solid liver abnormality is seen. Tiny gallstones. No gallbladder wall thickening, or biliary dilatation. Pancreas: Unremarkable. No pancreatic ductal dilatation or surrounding inflammatory changes. Spleen: Normal in size without significant abnormality. Adrenals/Urinary Tract: Adrenal glands are unremarkable. Status post left  nephrectomy and adrenalectomy. No suspicious soft tissue or contrast enhancement in the nephrectomy bed. Interval cryoablation of the posterior superior pole of the right kidney, without residual evidence of contrast enhancement (series 6, image 55). Cyst of the posterior midportion of the right kidney (series 11, image 119). Bladder is  unremarkable. Stomach/Bowel: Stomach is within normal limits. Appendix appears normal. No evidence of bowel wall thickening, distention, or inflammatory changes. Descending and sigmoid diverticulosis Vascular/Lymphatic: Aortic atherosclerosis. No enlarged abdominal or pelvic lymph nodes. Reproductive: No mass or other abnormality. Other: No abdominal wall hernia or abnormality. No ascites. Musculoskeletal: No acute osseous findings. Status post left hip total arthroplasty. IMPRESSION: 1. Interval cryoablation of the posterior superior pole of the right kidney, without residual evidence of contrast enhancement. 2. Status post left nephrectomy and adrenalectomy. No suspicious soft tissue or contrast enhancement in the nephrectomy bed. 3. New, pleural-based nodule of the deep right azygoesophageal recess, measuring 3.5 x 1.9 cm well as a 0.4 cm nodule of the right costophrenic recess. Slightly enlarged subpleural nodule of the anterior right upper lobe, measuring 0.5 cm, previously no greater than 0.3 cm. Findings are consistent with new and worsened pulmonary metastatic disease. 4. New, bandlike scarring and fibrosis of the inferior right upper lobe as well as the inferior right lower lobe, which obscures previously seen small pulmonary nodules, consistent with interval radiation therapy and development of radiation fibrosis. 5. Coronary artery disease. 6. Cholelithiasis. 7. Descending and sigmoid diverticulosis. Aortic Atherosclerosis (ICD10-I70.0). Electronically Signed   By: Delanna Ahmadi M.D.   On: 01/10/2022 15:17    Labs:  CBC: Recent Labs    11/09/21 0606 12/05/21 1340 12/26/21 0854 01/16/22 0751  WBC 15.7* 6.8 7.7 7.0  HGB 9.1* 12.0* 12.6* 13.2  HCT 27.1* 35.5* 37.3* 38.8*  PLT 234 278 270 250    COAGS: Recent Labs    08/23/21 0849  INR 1.1    BMP: Recent Labs    11/11/21 0755 12/05/21 1340 12/22/21 1142 12/26/21 0854 01/16/22 0751  NA 133* 140 142 139 140  K 4.0 3.9 4.1 4.0  4.1  CL 101 109 106 107 107  CO2 '27 26 26 26 25  '$ GLUCOSE 94 113* 93 137* 105*  BUN '20 18 22 20 18  '$ CALCIUM 8.2* 9.5 9.2 8.9 9.0  CREATININE 1.04 1.17 1.27 1.19 1.31*  GFRNONAA >60 >60  --  >60 >60    LIVER FUNCTION TESTS: Recent Labs    11/11/21 0755 12/05/21 1340 12/26/21 0854 01/16/22 0751  BILITOT  --  0.4 0.2* 0.3  AST  --  '22 21 25  '$ ALT  --  '25 24 28  '$ ALKPHOS  --  82 75 85  PROT  --  7.3 7.1 7.1  ALBUMIN 3.1* 4.5 4.2 4.2     Assessment and Plan:  57-monthstatus post CT-guided cryoablation of 2 right renal upper pole solid enhancing masses compatible with renal neoplasm/renal cell carcinoma or metastatic disease to the right kidney.  Patient has known metastatic renal cell carcinoma status post left nephrectomy.  He has recovered very well from the renal cryoablation.  Surveillance imaging confirms stable ablation changes.  No residual recurrent disease.  No suspicious marginal or nodular enhancement.  From a renal cryoablation standpoint he has doing very well.  Plan: Repeat surveillance CT imaging in 3 months with contrast.  This will be scheduled with Dr. SAlen Blewwho also follows him closely.    Electronically Signed: MGreggory Keen10/31/2023, 10:00 AM   I spent  a total of    40 Minutes in remote  clinical consultation, greater than 50% of which was counseling/coordinating care for this patient with metastatic renal carcinoma.    Visit type: Audio only (telephone). Audio (no video) only due to patient's lack of internet/smartphone capability. Alternative for in-person consultation at [facility], [address] [place], [place], [place]. This visit type was conducted due to national recommendations for restrictions regarding the COVID-19 Pandemic (e.g. social distancing).  This format is felt to be most appropriate for this patient at this time.  All issues noted in this document were discussed and addressed.

## 2022-02-06 ENCOUNTER — Telehealth: Payer: Self-pay | Admitting: Pharmacist

## 2022-02-06 ENCOUNTER — Telehealth: Payer: Self-pay | Admitting: Pharmacy Technician

## 2022-02-06 ENCOUNTER — Inpatient Hospital Stay: Payer: 59 | Attending: Oncology

## 2022-02-06 ENCOUNTER — Other Ambulatory Visit (HOSPITAL_COMMUNITY): Payer: Self-pay

## 2022-02-06 ENCOUNTER — Inpatient Hospital Stay: Payer: 59

## 2022-02-06 ENCOUNTER — Inpatient Hospital Stay (HOSPITAL_BASED_OUTPATIENT_CLINIC_OR_DEPARTMENT_OTHER): Payer: 59 | Admitting: Oncology

## 2022-02-06 VITALS — BP 105/69 | HR 65 | Temp 97.8°F | Resp 18

## 2022-02-06 VITALS — BP 124/74 | HR 69 | Temp 97.3°F | Resp 14 | Wt 249.6 lb

## 2022-02-06 DIAGNOSIS — C642 Malignant neoplasm of left kidney, except renal pelvis: Secondary | ICD-10-CM | POA: Insufficient documentation

## 2022-02-06 DIAGNOSIS — Z5112 Encounter for antineoplastic immunotherapy: Secondary | ICD-10-CM | POA: Diagnosis not present

## 2022-02-06 LAB — CBC WITH DIFFERENTIAL (CANCER CENTER ONLY)
Abs Immature Granulocytes: 0.02 10*3/uL (ref 0.00–0.07)
Basophils Absolute: 0.1 10*3/uL (ref 0.0–0.1)
Basophils Relative: 1 %
Eosinophils Absolute: 0.5 10*3/uL (ref 0.0–0.5)
Eosinophils Relative: 6 %
HCT: 40.1 % (ref 39.0–52.0)
Hemoglobin: 13.2 g/dL (ref 13.0–17.0)
Immature Granulocytes: 0 %
Lymphocytes Relative: 41 %
Lymphs Abs: 3 10*3/uL (ref 0.7–4.0)
MCH: 29.4 pg (ref 26.0–34.0)
MCHC: 32.9 g/dL (ref 30.0–36.0)
MCV: 89.3 fL (ref 80.0–100.0)
Monocytes Absolute: 0.9 10*3/uL (ref 0.1–1.0)
Monocytes Relative: 12 %
Neutro Abs: 3 10*3/uL (ref 1.7–7.7)
Neutrophils Relative %: 40 %
Platelet Count: 256 10*3/uL (ref 150–400)
RBC: 4.49 MIL/uL (ref 4.22–5.81)
RDW: 13.6 % (ref 11.5–15.5)
WBC Count: 7.5 10*3/uL (ref 4.0–10.5)
nRBC: 0 % (ref 0.0–0.2)

## 2022-02-06 LAB — CMP (CANCER CENTER ONLY)
ALT: 31 U/L (ref 0–44)
AST: 26 U/L (ref 15–41)
Albumin: 4.2 g/dL (ref 3.5–5.0)
Alkaline Phosphatase: 84 U/L (ref 38–126)
Anion gap: 7 (ref 5–15)
BUN: 20 mg/dL (ref 8–23)
CO2: 28 mmol/L (ref 22–32)
Calcium: 9.2 mg/dL (ref 8.9–10.3)
Chloride: 107 mmol/L (ref 98–111)
Creatinine: 1.29 mg/dL — ABNORMAL HIGH (ref 0.61–1.24)
GFR, Estimated: >60 mL/min (ref >60)
Glucose, Bld: 96 mg/dL (ref 70–99)
Potassium: 4.3 mmol/L (ref 3.5–5.1)
Sodium: 142 mmol/L (ref 135–145)
Total Bilirubin: 0.3 mg/dL (ref 0.3–1.2)
Total Protein: 7.1 g/dL (ref 6.5–8.1)

## 2022-02-06 MED ORDER — AXITINIB 5 MG PO TABS
5.0000 mg | ORAL_TABLET | Freq: Every day | ORAL | 3 refills | Status: DC
  Filled 2022-02-06: qty 30, 30d supply, fill #0

## 2022-02-06 MED ORDER — SODIUM CHLORIDE 0.9 % IV SOLN: Freq: Once | INTRAVENOUS | Status: AC

## 2022-02-06 MED ORDER — SODIUM CHLORIDE 0.9 % IV SOLN
200.0000 mg | Freq: Once | INTRAVENOUS | Status: AC
  Administered 2022-02-06: 200 mg via INTRAVENOUS
  Filled 2022-02-06: qty 200

## 2022-02-06 MED ORDER — AXITINIB 5 MG PO TABS: 5.0000 mg | ORAL_TABLET | Freq: Every day | ORAL | 3 refills | Status: DC

## 2022-02-06 NOTE — Telephone Encounter (Signed)
Oral Oncology Patient Advocate Encounter   Case ID [id number]  Additional information faxed to [place], [place] Patient [place] Direct Number: [phone]  Fax: [phone]

## 2022-02-06 NOTE — Telephone Encounter (Addendum)
Oral Oncology Patient Advocate Encounter   Received notification that prior authorization for Inlyta is required.   PA submitted on 02/06/2022 [facility] Status is pending    PA must be completed by phone.  Lady Deutscher, CPhT-Adv Oncology Pharmacy Patient [place] Direct Number: [phone]  Fax: [phone]

## 2022-02-06 NOTE — Patient Instructions (Signed)
[facility] MEDICAL [facility]COLOGY  Discharge Instructions: Thank you for choosing [facility] to provide your oncology and hematology care.   If you have a lab appointment with the Cancer Center, please go directly to the Cancer Center and check in at the registration area.   Wear comfortable clothing and clothing appropriate for easy access to any Portacath or PICC line.   We strive to give you quality time with your provider. You may need to reschedule your appointment if you arrive late (15 or more minutes).  Arriving late affects you and other patients whose appointments are after yours.  Also, if you miss three or more appointments without notifying the office, you may be dismissed from the clinic at the provider's discretion.      For prescription refill requests, have your pharmacy contact our office and allow 72 hours for refills to be completed.    Today you received the following chemotherapy and/or immunotherapy agents: Keytruda.       To help prevent nausea and vomiting after your treatment, we encourage you to take your nausea medication as directed.  BELOW ARE SYMPTOMS THAT SHOULD BE REPORTED IMMEDIATELY: *FEVER GREATER THAN 100.4 F (38 C) OR HIGHER *CHILLS OR SWEATING *NAUSEA AND VOMITING THAT IS NOT CONTROLLED WITH YOUR NAUSEA MEDICATION *UNUSUAL SHORTNESS OF BREATH *UNUSUAL BRUISING OR BLEEDING *URINARY PROBLEMS (pain or burning when urinating, or frequent urination) *BOWEL PROBLEMS (unusual diarrhea, constipation, pain near the anus) TENDERNESS IN MOUTH AND THROAT WITH OR WITHOUT PRESENCE OF ULCERS (sore throat, sores in mouth, or a toothache) UNUSUAL RASH, SWELLING OR PAIN  UNUSUAL VAGINAL DISCHARGE OR ITCHING   Items with * indicate a potential emergency and should be followed up as soon as possible or go to the Emergency Department if any problems should occur.  Please show the CHEMOTHERAPY ALERT CARD or IMMUNOTHERAPY ALERT CARD at check-in to  the Emergency Department and triage nurse.  Should you have questions after your visit or need to cancel or reschedule your appointment, please contact [facility]  Dept: [phone]  and follow the prompts.  Office hours are 8:00 a.m. to 4:30 p.m. Monday - Friday. Please note that voicemails left after 4:00 p.m. may not be returned until the following business day.  We are closed weekends and major holidays. You have access to a nurse at all times for urgent questions. Please call the main number to the clinic Dept: [phone] and follow the prompts.   For any non-urgent questions, you may also contact your provider using MyChart. We now offer e-Visits for anyone 18 and older to request care online for non-urgent symptoms. For details visit [email or web address].   Also download the MyChart app! Go to the app store, search "MyChart", open the app, select [facility], and log in with your MyChart username and password.  Masks are optional in the cancer centers. If you would like for your care team to wear a mask while they are taking care of you, please let them know. You may have one support person who is at least 64 years old accompany you for your appointments. 

## 2022-02-06 NOTE — Progress Notes (Signed)
Hematology and Oncology Follow Up Visit  Marc [name] [id number] 10/18/1955 66 y.o. 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  He initially presented with localized disease in 2021.   Prior Therapy:  He underwent robotic assisted laparoscopic left radical nephrectomy by Dr. Lovena [name] on January 05, 2020.  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  The final pathological staging was T3aN0 grade 2 tumor.    He is status post radiation therapy to the pulmonary nodules completed March 2023.  He received 50 Gray in 5 fractions.  He is status post cryoablation to 2 lesions in the right kidney completed on Aug 23, 2021.  He is s/p endoscopic transsphenoidal pituitary resection completed by Dr. Wilburn [name] on November 08, 2021.  The final pathology showed clear-cell renal cell carcinoma.   SRS treatment to pituitary bed completed in October 2023.  He received 5 fractions for a total of 25 Gray.  Current therapy:   Pembrolizumab 200 mg every 3 weeks started on Aug 04, 2021.  He is here for cycle 4.     Interim History: Marc [name] presents today for repeat follow-up.  Since the last visit, he completed radiation therapy to the pituitary bed without any complications.  He denies any nausea, vomiting or headaches.  He denies any skin rashes or lesions.  His performance status quality of life remains unchanged.  He denies shortness of breath or diarrhea.  He denies any decline in his performance status.     Medications: Reviewed without changes. Current Outpatient Medications  Medication Sig Dispense Refill   Ascorbic Acid (VITAMIN C PO) Take 1 tablet by mouth every evening.     aspirin EC 81 MG tablet Take 81 mg by mouth  every evening. Swallow whole.     atorvastatin (LIPITOR) 10 MG tablet Take 10 mg by mouth every evening.     cholecalciferol (VITAMIN D3) 25 MCG (1000 UNIT) tablet Take 1,000 Units by mouth every evening.     desmopressin (DDAVP) 0.1 MG tablet Take 0.5 tablets (0.05 mg total) by mouth 2 (two) times daily. 60 tablet 2   Ferrous Sulfate (IRON SLOW RELEASE PO) Take 1 tablet by mouth every evening.     HYDROcodone-acetaminophen (NORCO) 5-325 MG tablet Take 1-2 tablets by mouth every 6 (six) hours as needed for moderate pain. (Patient not taking: Reported on 12/22/2021) 20 tablet 0   HYDROcodone-acetaminophen (NORCO/VICODIN) 5-325 MG tablet Take 1 tablet by mouth every 4 (four) hours as needed for moderate pain. (Patient not taking: Reported on 12/22/2021) 30 tablet 0   hydrocortisone (CORTEF) 10 MG tablet TAKE 1.5 TABLETS BY MOUTH EVERY MORNING AND 1 TABLET AT 4PM FOR PAIN 75 tablet 1   ibuprofen (ADVIL) 200 MG tablet Take 400-600 mg by mouth every 6 (six) hours as needed for moderate pain.     levothyroxine (SYNTHROID) 88 MCG tablet Take 1 tablet (88 mcg total) by mouth daily. 90 tablet 3   Multiple Vitamins-Minerals (MULTIVITAMIN WITH MINERALS) tablet Take 1 tablet by mouth every evening.     Omega-3 Fatty Acids (FISH OIL PO) Take 1 capsule by mouth every evening.     prochlorperazine (COMPAZINE) 10 MG tablet Take 1 tablet (10 mg total) by mouth every 6 (six) hours as needed  for nausea or vomiting. 30 tablet 0   Testosterone (ANDROGEL) 20.25 MG/1.25GM (1.62%) GEL Place 1 Pump onto the skin as directed. 1 pump to each shoulder daily 75 g 5   vitamin E 180 MG (400 UNITS) capsule Take 400 Units by mouth every evening.     No current facility-administered medications for this visit.     Allergies: No Known Allergies    Physical Exam:  Blood pressure 124/74, pulse 69, temperature (!) 97.3 F (36.3 C), temperature source Oral, resp. rate 14, weight 249 lb 9.6 oz (113.2 kg), SpO2 94 %.    ECOG:  1   General appearance: Comfortable appearing without any discomfort Head: Normocephalic without any trauma Oropharynx: Mucous membranes are moist and pink without any thrush or ulcers. Eyes: Pupils are equal and round reactive to light. Lymph nodes: No cervical, supraclavicular, inguinal or axillary lymphadenopathy.   Heart:regular rate and rhythm.  S1 and S2 without leg edema. Lung: Clear without any rhonchi or wheezes.  No dullness to percussion. Abdomin: Soft, nontender, nondistended with good bowel sounds.  No hepatosplenomegaly. Musculoskeletal: No joint deformity or effusion.  Full range of motion noted. Neurological: No deficits noted on motor, sensory and deep tendon reflex exam. Skin: No petechial rash or dryness.  Appeared moist.      Lab Results: Lab Results  Component Value Date   WBC 7.0 01/16/2022   HGB 13.2 01/16/2022   HCT 38.8 (L) 01/16/2022   MCV 87.8 01/16/2022   PLT 250 01/16/2022   PSA 0.00 (L) 06/20/2021     Chemistry      Component Value Date/Time   NA 140 01/16/2022 0751   K 4.1 01/16/2022 0751   CL 107 01/16/2022 0751   CO2 25 01/16/2022 0751   BUN 18 01/16/2022 0751   CREATININE 1.31 (H) 01/16/2022 0751      Component Value Date/Time   CALCIUM 9.0 01/16/2022 0751   ALKPHOS 85 01/16/2022 0751   AST 25 01/16/2022 0751   ALT 28 01/16/2022 0751   BILITOT 0.3 01/16/2022 0751         Impression and Plan:  41 year old with:  1.  Kidney cancer diagnosed in 2021.  He developed stage IV clear-cell renal cell carcinoma with isolated pulmonary nodules as well as pituitary involvement in February 2023.    CT scan in October 2023 was reviewed again and showed the pulmonary metastasis beyond the resected pituitary tumor.  Risks and benefits of continuing Pembrolizumab alone versus adding axitinib were discussed.  Complication associated with oral targeted therapy that include nausea, fatigue, hypertension and hand-foot syndrome reiterated.  To get  the maximum benefit combination therapy is the best approach.  To minimize side effect single agent Pembrolizumab would be continued.  After discussion today, he is agreeable to proceed.  We will start with Inlyta 5 mg daily and titrate up depending on his tolerance.   2.  Right renal mass: No history of relapse after cryoablation.  3.  Pituitary tumor: Status post surgical resection followed by stereotactic radiosurgery.    4.  Autoimmune complications: These include pneumonitis, colitis and thyroid disease.     5.  Follow-up: In 3 weeks for a follow-up visit.   30  minutes were dedicated to this visit.  Spent on updating disease status, treatment choices and outlining future plan of care review.    Zola Button, MD 11/7/20238:33 AM

## 2022-02-06 NOTE — Telephone Encounter (Signed)
Oral Oncology Pharmacist Encounter  Received new prescription for Inlyta (axitinib) for the treatment of progressive stage IV clear-cell renal cell carcinoma in conjunction with pembrolizumab (existing therapy), planned duration until disease progression or unacceptable drug toxicity.  CMP from 02/06/22 assessed, no relevant lab abnormalities. Prescription dose and frequency assessed. Spoke with MD, 5 mg daily is intentional dosing and he plans to titrate up dose if tolerated in the future.   Current medication list in Epic reviewed, no DDIs with axitinib identified.  Evaluated chart and no patient barriers to medication adherence identified.   Prescription has been e-scribed to the [facility] for benefits analysis and approval.  Oral Oncology Clinic will continue to follow for insurance authorization, copayment issues, initial counseling and start date.  Patient agreed to treatment on 02/06/22 per MD documentation.  Darl Pikes, PharmD, BCPS, BCOP, CPP Hematology/Oncology Clinical Pharmacist Practitioner [place]/DB/AP Oral [facility] [phone]  02/06/2022 9:28 AM

## 2022-02-06 NOTE — Telephone Encounter (Signed)
Oral Oncology Patient Advocate Encounter   Was successful in obtaining a copay card for Inlyta.  This copay card will make the patients copay as low as $0 per month.  I have spoken with the patient.    The billing information is as follows and has been shared with [facility].   RxBin: [id number] Member ID: [id number] Group ID: [id number]   Lady Deutscher, CPhT-Adv Oncology Pharmacy Patient [place] Direct Number: [phone]  Fax: [phone]

## 2022-02-06 NOTE — Telephone Encounter (Signed)
Oral Oncology Patient Advocate Encounter  Prior Authorization for Marc [name] has been approved.    PA# [id number] Effective dates: 02/06/22 through 02/07/23  Patient must fill at [facility].    Lady Deutscher, CPhT-Adv Oncology Pharmacy Patient [place] Direct Number: [phone]  Fax: [phone]

## 2022-02-08 ENCOUNTER — Other Ambulatory Visit: Payer: Self-pay | Admitting: Internal Medicine

## 2022-02-08 NOTE — Telephone Encounter (Signed)
Oral Chemotherapy Pharmacist Encounter  Mr. Blatz will have his Inlyta delivered on 02/09/22. He plans on taking his first dose on 02/09/22 or 02/10/22.  Patient Education I spoke with patient for overview of new oral chemotherapy medication: Inlyta (axitinib) for the treatment of progressive stage IV clear-cell renal cell carcinoma in conjunction with pembrolizumab (existing therapy), planned duration until disease progression or unacceptable drug toxicity.    Pt is doing well. Counseled patient on administration, dosing, side effects, monitoring, drug-food interactions, safe handling, storage, and disposal. Patient will take 1 tablet (5 mg total) by mouth daily .  Side effects include but not limited to: diarrhea, .    Reviewed with patient importance of keeping a medication schedule and plan for any missed doses.  After discussion with patient no patient barriers to medication adherence identified.   Mr. Sivley voiced understanding and appreciation. All questions answered. Medication handout emailed to patient.  Provided patient with Oral [facility] phone number. Patient knows to call the office with questions or concerns. Oral Chemotherapy Navigation Clinic will continue to follow.  Darl Pikes, PharmD, BCPS, BCOP, CPP Hematology/Oncology Clinical Pharmacist Practitioner [place]/DB/AP Oral [facility] [phone]  02/08/2022 4:28 PM

## 2022-02-12 ENCOUNTER — Telehealth: Payer: Self-pay | Admitting: Oncology

## 2022-02-12 NOTE — Telephone Encounter (Signed)
Called patient regarding upcoming November and December appointments. Patient is notified.  

## 2022-02-27 ENCOUNTER — Other Ambulatory Visit: Payer: Self-pay

## 2022-02-27 ENCOUNTER — Inpatient Hospital Stay (HOSPITAL_BASED_OUTPATIENT_CLINIC_OR_DEPARTMENT_OTHER): Payer: 59 | Admitting: Oncology

## 2022-02-27 ENCOUNTER — Inpatient Hospital Stay: Payer: 59

## 2022-02-27 VITALS — BP 121/77 | HR 84 | Temp 97.7°F | Resp 18 | Ht 68.0 in | Wt 250.4 lb

## 2022-02-27 DIAGNOSIS — C642 Malignant neoplasm of left kidney, except renal pelvis: Secondary | ICD-10-CM

## 2022-02-27 DIAGNOSIS — Z5112 Encounter for antineoplastic immunotherapy: Secondary | ICD-10-CM | POA: Diagnosis not present

## 2022-02-27 LAB — CBC WITH DIFFERENTIAL (CANCER CENTER ONLY)
Abs Immature Granulocytes: 0.02 10*3/uL (ref 0.00–0.07)
Basophils Absolute: 0.1 10*3/uL (ref 0.0–0.1)
Basophils Relative: 1 %
Eosinophils Absolute: 0.4 10*3/uL (ref 0.0–0.5)
Eosinophils Relative: 5 %
HCT: 42 % (ref 39.0–52.0)
Hemoglobin: 14.2 g/dL (ref 13.0–17.0)
Immature Granulocytes: 0 %
Lymphocytes Relative: 29 %
Lymphs Abs: 2.6 10*3/uL (ref 0.7–4.0)
MCH: 29.2 pg (ref 26.0–34.0)
MCHC: 33.8 g/dL (ref 30.0–36.0)
MCV: 86.4 fL (ref 80.0–100.0)
Monocytes Absolute: 1 10*3/uL (ref 0.1–1.0)
Monocytes Relative: 11 %
Neutro Abs: 4.8 10*3/uL (ref 1.7–7.7)
Neutrophils Relative %: 54 %
Platelet Count: 248 10*3/uL (ref 150–400)
RBC: 4.86 MIL/uL (ref 4.22–5.81)
RDW: 13.3 % (ref 11.5–15.5)
WBC Count: 8.8 10*3/uL (ref 4.0–10.5)
nRBC: 0 % (ref 0.0–0.2)

## 2022-02-27 LAB — CMP (CANCER CENTER ONLY)
ALT: 33 U/L (ref 0–44)
AST: 27 U/L (ref 15–41)
Albumin: 4.3 g/dL (ref 3.5–5.0)
Alkaline Phosphatase: 93 U/L (ref 38–126)
Anion gap: 8 (ref 5–15)
BUN: 19 mg/dL (ref 8–23)
CO2: 26 mmol/L (ref 22–32)
Calcium: 9.3 mg/dL (ref 8.9–10.3)
Chloride: 105 mmol/L (ref 98–111)
Creatinine: 1.33 mg/dL — ABNORMAL HIGH (ref 0.61–1.24)
GFR, Estimated: 60 mL/min — ABNORMAL LOW (ref >60)
Glucose, Bld: 108 mg/dL — ABNORMAL HIGH (ref 70–99)
Potassium: 4 mmol/L (ref 3.5–5.1)
Sodium: 139 mmol/L (ref 135–145)
Total Bilirubin: 0.4 mg/dL (ref 0.3–1.2)
Total Protein: 7.5 g/dL (ref 6.5–8.1)

## 2022-02-27 MED ORDER — SODIUM CHLORIDE 0.9 % IV SOLN: Freq: Once | INTRAVENOUS | Status: AC

## 2022-02-27 MED ORDER — SODIUM CHLORIDE 0.9 % IV SOLN
200.0000 mg | Freq: Once | INTRAVENOUS | Status: AC
  Administered 2022-02-27: 200 mg via INTRAVENOUS
  Filled 2022-02-27: qty 200

## 2022-02-27 NOTE — Patient Instructions (Signed)
[facility]ICAL [facility]COLOG[facility]  Discharge Instructions: Thank you for choosing [facility] to provide your oncology and hematology care.   If you have a lab appointment with the Cancer Center, please go directly to the Cancer Center and check in at the registration area.   Wear comfortable clothing and clothing appropriate for easy access to any Portacath or PICC line.   We strive to give you quality time with your provider. You may need to reschedule your appointment if you arrive late (15 or more minutes).  Arriving late affects you and other patients whose appointments are after yours.  Also, if you miss three or more appointments without notifying the office, you may be dismissed from the clinic at the provider's discretion.      For prescription refill requests, have your pharmacy contact our office and allow 72 hours for refills to be completed.    Today you received the following chemotherapy and/or immunotherapy agents: Keytruda.       To help prevent nausea and vomiting after your treatment, we encourage you to take your nausea medication as directed.  BELOW ARE SYMPTOMS THAT SHOULD BE REPORTED IMMEDIATELY: *FEVER GREATER THAN 100.4 F (38 C) OR HIGHER *CHILLS OR SWEATING *NAUSEA AND VOMITING THAT IS NOT CONTROLLED WITH YOUR NAUSEA MEDICATION *UNUSUAL SHORTNESS OF BREATH *UNUSUAL BRUISING OR BLEEDING *URINARY PROBLEMS (pain or burning when urinating, or frequent urination) *BOWEL PROBLEMS (unusual diarrhea, constipation, pain near the anus) TENDERNESS IN MOUTH AND THROAT WITH OR WITHOUT PRESENCE OF ULCERS (sore throat, sores in mouth, or a toothache) UNUSUAL RASH, SWELLING OR PAIN  UNUSUAL VAGINAL DISCHARGE OR ITCHING   Items with * indicate a potential emergency and should be followed up as soon as possible or go to the Emergency Department if any problems should occur.  Please show the CHEMOTHERAPY ALERT CARD or IMMUNOTHERAPY ALERT CARD at check-in to  the Emergency Department and triage nurse.  Should you have questions after your visit or need to cancel or reschedule your appointment, please contact [facility]  Dept: [phone]  and follow the prompts.  Office hours are 8:00 a.m. to 4:30 p.m. Monday - Friday. Please note that voicemails left after 4:00 p.m. may not be returned until the following business day.  We are closed weekends and major holidays. You have access to a nurse at all times for urgent questions. Please call the main number to the clinic Dept: [phone] and follow the prompts.   For any non-urgent questions, you may also contact your provider using MyChart. We now offer e-Visits for anyone 18 and older to request care online for non-urgent symptoms. For details visit [email or web address].   Also download the MyChart app! Go to the app store, search "MyChart", open the app, select [facility], and log in with your MyChart username and password.  Masks are optional in the cancer centers. If you would like for your care team to wear a mask while they are taking care of you, please let them know. You may have one support Saban Heinlen who is at least 64 years old accompany you for your appointments. 

## 2022-02-27 NOTE — Progress Notes (Signed)
Hematology and Oncology Follow Up Visit  Marc [name] [id number] 09-19-1955 66 y.o. 02/27/2022 8:12 AM [name], Marc [name], Marc Dad, MD   Principle Diagnosis: 26 year old with kidney cancer diagnosed in 2021 with localized disease.  He developed stage IV clear-cell with pulmonary involvement as well as pituitary metastasis diagnosed in 2023.     Prior Therapy:  He underwent robotic assisted laparoscopic left radical nephrectomy by Dr. Lovena Neighbours on January 05, 2020.  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  The final pathological staging was T3aN0 grade 2 tumor.    He is status post radiation therapy to the pulmonary nodules completed March 2023.  He received 50 Gray in 5 fractions.  He is status post cryoablation to 2 lesions in the right kidney completed on Aug 23, 2021.  He is s/p endoscopic transsphenoidal pituitary resection completed by Dr. Wilburn Cornelia on November 08, 2021.  The final pathology showed clear-cell renal cell carcinoma.   SRS treatment to pituitary bed completed in October 2023.  He received 5 fractions for a total of 25 Gray.  Current therapy:   Pembrolizumab 200 mg every 3 weeks started on Aug 04, 2021.  Axitinib 5 mg daily started on February 09, 2022.  He presents today for the next cycle of therapy.    Interim History: Marc [name] returns today for a follow-up.  Since the last visit, he reports that no major changes in his health.  He denies any nausea vomiting or abdominal pain.  He denies any complications related to axitinib.  He denies any excessive fatigue, tiredness or diarrhea.  Denies any hand-foot syndrome.  Blood pressure is mildly elevated but back to normal range at this time.     Medications: Updated on review. Current Outpatient Medications  Medication Sig Dispense Refill   Ascorbic Acid (VITAMIN C PO) Take 1 tablet by mouth  every evening.     aspirin EC 81 MG tablet Take 81 mg by mouth every evening. Swallow whole.     atorvastatin (LIPITOR) 10 MG tablet Take 10 mg by mouth every evening.     axitinib (INLYTA) 5 MG tablet Take 1 tablet (5 mg total) by mouth daily. 30 tablet 3   cholecalciferol (VITAMIN D3) 25 MCG (1000 UNIT) tablet Take 1,000 Units by mouth every evening.     desmopressin (DDAVP) 0.1 MG tablet Take 0.5 tablets (0.05 mg total) by mouth 2 (two) times daily. 60 tablet 2   Ferrous Sulfate (IRON SLOW RELEASE PO) Take 1 tablet by mouth every evening.     HYDROcodone-acetaminophen (NORCO) 5-325 MG tablet Take 1-2 tablets by mouth every 6 (six) hours as needed for moderate pain. (Patient not taking: Reported on 12/22/2021) 20 tablet 0   HYDROcodone-acetaminophen (NORCO/VICODIN) 5-325 MG tablet Take 1 tablet by mouth every 4 (four) hours as needed for moderate pain. (Patient not taking: Reported on 12/22/2021) 30 tablet 0   hydrocortisone (CORTEF) 10 MG tablet TAKE 1.5 TABLETS BY MOUTH EVERY MORNING AND 1 TABLET AT 4PM FOR PAIN 75 tablet 2   ibuprofen (ADVIL) 200 MG tablet Take 400-600 mg by mouth every 6 (six) hours as needed for moderate pain.     levothyroxine (SYNTHROID) 88 MCG tablet Take 1 tablet (88 mcg total) by mouth daily. 90 tablet 3   Multiple Vitamins-Minerals (MULTIVITAMIN WITH MINERALS) tablet Take 1 tablet by mouth every evening.     Omega-3 Fatty  Acids (FISH OIL PO) Take 1 capsule by mouth every evening.     prochlorperazine (COMPAZINE) 10 MG tablet Take 1 tablet (10 mg total) by mouth every 6 (six) hours as needed for nausea or vomiting. 30 tablet 0   Testosterone (ANDROGEL) 20.25 MG/1.25GM (1.62%) GEL Place 1 Pump onto the skin as directed. 1 pump to each shoulder daily 75 g 5   vitamin E 180 MG (400 UNITS) capsule Take 400 Units by mouth every evening.     No current facility-administered medications for this visit.     Allergies: No Known Allergies    Physical Exam:    Blood  pressure 121/77, pulse 84, temperature 97.7 F (36.5 C), temperature source Temporal, resp. rate 18, height '5\' 8"'$  (1.727 m), weight 250 lb 6.4 oz (113.6 kg), SpO2 96 %.   ECOG: 1    General appearance: Alert, awake without any distress. Head: Atraumatic without abnormalities Oropharynx: Without any thrush or ulcers. Eyes: No scleral icterus. Lymph nodes: No lymphadenopathy noted in the cervical, supraclavicular, or axillary nodes Heart:regular rate and rhythm, without any murmurs or gallops.   Lung: Clear to auscultation without any rhonchi, wheezes or dullness to percussion. Abdomin: Soft, nontender without any shifting dullness or ascites. Musculoskeletal: No clubbing or cyanosis. Neurological: No motor or sensory deficits. Skin: No rashes or lesions.     Lab Results: Lab Results  Component Value Date   WBC 7.5 02/06/2022   HGB 13.2 02/06/2022   HCT 40.1 02/06/2022   MCV 89.3 02/06/2022   PLT 256 02/06/2022   PSA 0.00 (L) 06/20/2021     Chemistry      Component Value Date/Time   NA 142 02/06/2022 0821   K 4.3 02/06/2022 0821   CL 107 02/06/2022 0821   CO2 28 02/06/2022 0821   BUN 20 02/06/2022 0821   CREATININE 1.29 (H) 02/06/2022 0821      Component Value Date/Time   CALCIUM 9.2 02/06/2022 0821   ALKPHOS 84 02/06/2022 0821   AST 26 02/06/2022 0821   ALT 31 02/06/2022 0821   BILITOT 0.3 02/06/2022 0821         Impression and Plan:  66 year old with:  1.  Stage IV clear-cell renal cell carcinoma wit pulmonary and pituitary involvement diagnosed in February 2023.    He is currently on Pembrolizumab with axitinib without any major complications.  Risks and benefits of continuing this treatment were discussed.  Complication clued hypertension, diarrhea, fatigue, hand-foot syndrome among others were reiterated.  Plan is to update his staging scans in February 2024.  If he has good tolerance and response he will continue on this treatment.  Different salvage  therapy options utilizing cabozantinib could be considered.   2.  Right renal mass: He is currently on observation after ablation.   3.  Pituitary tumor: No evidence of relapse at this time.  He is status post surgical resection followed by stereotactic radiosurgery.    4.  Autoimmune complications: I continue to educate him about these complications including pneumonitis, colitis and thyroid disease.     5.  Follow-up: He will return in 3 weeks for a follow-up.   30  minutes were spent on this encounter.  Time was dedicated to updating disease status, treatment choices and outlining future plan of care review.    Zola Button, MD 11/28/20238:12 AM

## 2022-03-20 ENCOUNTER — Inpatient Hospital Stay (HOSPITAL_BASED_OUTPATIENT_CLINIC_OR_DEPARTMENT_OTHER): Payer: 59 | Admitting: Oncology

## 2022-03-20 ENCOUNTER — Inpatient Hospital Stay: Payer: 59 | Attending: Oncology

## 2022-03-20 ENCOUNTER — Inpatient Hospital Stay: Payer: 59

## 2022-03-20 ENCOUNTER — Other Ambulatory Visit: Payer: Self-pay

## 2022-03-20 VITALS — BP 119/81 | HR 76 | Temp 97.8°F | Resp 18 | Ht 68.0 in | Wt 250.6 lb

## 2022-03-20 DIAGNOSIS — Z79899 Other long term (current) drug therapy: Secondary | ICD-10-CM | POA: Insufficient documentation

## 2022-03-20 DIAGNOSIS — C642 Malignant neoplasm of left kidney, except renal pelvis: Secondary | ICD-10-CM

## 2022-03-20 DIAGNOSIS — Z5112 Encounter for antineoplastic immunotherapy: Secondary | ICD-10-CM | POA: Diagnosis not present

## 2022-03-20 DIAGNOSIS — C7800 Secondary malignant neoplasm of unspecified lung: Secondary | ICD-10-CM | POA: Insufficient documentation

## 2022-03-20 LAB — CBC WITH DIFFERENTIAL (CANCER CENTER ONLY)
Abs Immature Granulocytes: 0.03 10*3/uL (ref 0.00–0.07)
Basophils Absolute: 0.1 10*3/uL (ref 0.0–0.1)
Basophils Relative: 1 %
Eosinophils Absolute: 0.4 10*3/uL (ref 0.0–0.5)
Eosinophils Relative: 5 %
HCT: 43.7 % (ref 39.0–52.0)
Hemoglobin: 14.8 g/dL (ref 13.0–17.0)
Immature Granulocytes: 0 %
Lymphocytes Relative: 38 %
Lymphs Abs: 3.2 10*3/uL (ref 0.7–4.0)
MCH: 28.8 pg (ref 26.0–34.0)
MCHC: 33.9 g/dL (ref 30.0–36.0)
MCV: 85 fL (ref 80.0–100.0)
Monocytes Absolute: 0.9 10*3/uL (ref 0.1–1.0)
Monocytes Relative: 11 %
Neutro Abs: 3.8 10*3/uL (ref 1.7–7.7)
Neutrophils Relative %: 45 %
Platelet Count: 259 10*3/uL (ref 150–400)
RBC: 5.14 MIL/uL (ref 4.22–5.81)
RDW: 13.8 % (ref 11.5–15.5)
WBC Count: 8.4 10*3/uL (ref 4.0–10.5)
nRBC: 0 % (ref 0.0–0.2)

## 2022-03-20 LAB — CMP (CANCER CENTER ONLY)
ALT: 39 U/L (ref 0–44)
AST: 34 U/L (ref 15–41)
Albumin: 4.2 g/dL (ref 3.5–5.0)
Alkaline Phosphatase: 92 U/L (ref 38–126)
Anion gap: 10 (ref 5–15)
BUN: 22 mg/dL (ref 8–23)
CO2: 24 mmol/L (ref 22–32)
Calcium: 8.9 mg/dL (ref 8.9–10.3)
Chloride: 108 mmol/L (ref 98–111)
Creatinine: 1.15 mg/dL (ref 0.61–1.24)
GFR, Estimated: >60 mL/min (ref >60)
Glucose, Bld: 100 mg/dL — ABNORMAL HIGH (ref 70–99)
Potassium: 4.2 mmol/L (ref 3.5–5.1)
Sodium: 142 mmol/L (ref 135–145)
Total Bilirubin: 0.8 mg/dL (ref 0.3–1.2)
Total Protein: 7.7 g/dL (ref 6.5–8.1)

## 2022-03-20 LAB — TSH: TSH: <0.01 u[IU]/mL — ABNORMAL LOW (ref 0.350–4.500)

## 2022-03-20 MED ORDER — SODIUM CHLORIDE 0.9 % IV SOLN: Freq: Once | INTRAVENOUS | Status: AC

## 2022-03-20 MED ORDER — SODIUM CHLORIDE 0.9 % IV SOLN
200.0000 mg | Freq: Once | INTRAVENOUS | Status: AC
  Administered 2022-03-20: 200 mg via INTRAVENOUS
  Filled 2022-03-20: qty 200

## 2022-03-20 NOTE — Progress Notes (Signed)
Hematology and Oncology Follow Up Visit  Marc [name] [id number] 10-19-1955 66 y.o. 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  He initially presented with localized disease in 59.   Prior Therapy:  He underwent robotic assisted laparoscopic left radical nephrectomy by Dr. Lovena Neighbours on January 05, 2020.  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  The final pathological staging was T3aN0 grade 2 tumor.    He is status post radiation therapy to the pulmonary nodules completed March 2023.  He received 50 Gray in 5 fractions.  He is status post cryoablation to 2 lesions in the right kidney completed on Aug 23, 2021.  He is s/p endoscopic transsphenoidal pituitary resection completed by Dr. Wilburn Cornelia on November 08, 2021.  The final pathology showed clear-cell renal cell carcinoma.   SRS treatment to pituitary bed completed in October 2023.  He received 5 fractions for a total of 25 Gray.  Current therapy:   Pembrolizumab 200 mg every 3 weeks started on Aug 04, 2021.  Axitinib 5 mg daily started on February 09, 2022.  He presents today for the next cycle of therapy.    Interim History: Mr. Kinsella presents today for a follow-up visit.  Since her last visit, he reports feeling well without any major complaints.  He denies any nausea, vomiting or abdominal pain.  He does not report any diarrhea, acid syndrome or hypertension.  He remains active and continues to attend activities of daily living.     Medications: Reviewed without changes. Current Outpatient Medications  Medication Sig Dispense Refill   Ascorbic Acid (VITAMIN C PO) Take 1 tablet by mouth every evening.     aspirin EC 81 MG tablet Take 81  mg by mouth every evening. Swallow whole.     atorvastatin (LIPITOR) 10 MG tablet Take 10 mg by mouth every evening.     axitinib (INLYTA) 5 MG tablet Take 1 tablet (5 mg total) by mouth daily. 30 tablet 3   cholecalciferol (VITAMIN D3) 25 MCG (1000 UNIT) tablet Take 1,000 Units by mouth every evening.     desmopressin (DDAVP) 0.1 MG tablet Take 0.5 tablets (0.05 mg total) by mouth 2 (two) times daily. 60 tablet 2   Ferrous Sulfate (IRON SLOW RELEASE PO) Take 1 tablet by mouth every evening.     HYDROcodone-acetaminophen (NORCO) 5-325 MG tablet Take 1-2 tablets by mouth every 6 (six) hours as needed for moderate pain. (Patient not taking: Reported on 12/22/2021) 20 tablet 0   HYDROcodone-acetaminophen (NORCO/VICODIN) 5-325 MG tablet Take 1 tablet by mouth every 4 (four) hours as needed for moderate pain. (Patient not taking: Reported on 12/22/2021) 30 tablet 0   hydrocortisone (CORTEF) 10 MG tablet TAKE 1.5 TABLETS BY MOUTH EVERY MORNING AND 1 TABLET AT 4PM FOR PAIN 75 tablet 2   ibuprofen (ADVIL) 200 MG tablet Take 400-600 mg by mouth every 6 (six) hours as needed for moderate pain.     levothyroxine (SYNTHROID) 88 MCG tablet Take 1 tablet (88 mcg total) by mouth daily. 90 tablet 3   Multiple Vitamins-Minerals (MULTIVITAMIN WITH MINERALS) tablet Take 1 tablet by mouth every evening.     Omega-3 Fatty Acids (FISH OIL PO) Take 1 capsule by mouth every evening.  prochlorperazine (COMPAZINE) 10 MG tablet Take 1 tablet (10 mg total) by mouth every 6 (six) hours as needed for nausea or vomiting. 30 tablet 0   Testosterone (ANDROGEL) 20.25 MG/1.25GM (1.62%) GEL Place 1 Pump onto the skin as directed. 1 pump to each shoulder daily 75 g 5   vitamin E 180 MG (400 UNITS) capsule Take 400 Units by mouth every evening.     No current facility-administered medications for this visit.     Allergies: No Known Allergies    Physical Exam:    Blood pressure 119/81, pulse 76, temperature 97.8 F (36.6  C), temperature source Temporal, resp. rate 18, height '5\' 8"'$  (1.727 m), weight 250 lb 9.6 oz (113.7 kg), SpO2 97 %.    ECOG: 1   General appearance: Comfortable appearing without any discomfort Head: Normocephalic without any trauma Oropharynx: Mucous membranes are moist and pink without any thrush or ulcers. Eyes: Pupils are equal and round reactive to light. Lymph nodes: No cervical, supraclavicular, inguinal or axillary lymphadenopathy.   Heart:regular rate and rhythm.  S1 and S2 without leg edema. Lung: Clear without any rhonchi or wheezes.  No dullness to percussion. Abdomin: Soft, nontender, nondistended with good bowel sounds.  No hepatosplenomegaly. Musculoskeletal: No joint deformity or effusion.  Full range of motion noted. Neurological: No deficits noted on motor, sensory and deep tendon reflex exam. Skin: No petechial rash or dryness.  Appeared moist.  .      Lab Results: Lab Results  Component Value Date   WBC 8.8 02/27/2022   HGB 14.2 02/27/2022   HCT 42.0 02/27/2022   MCV 86.4 02/27/2022   PLT 248 02/27/2022   PSA 0.00 (L) 06/20/2021     Chemistry      Component Value Date/Time   NA 139 02/27/2022 0755   K 4.0 02/27/2022 0755   CL 105 02/27/2022 0755   CO2 26 02/27/2022 0755   BUN 19 02/27/2022 0755   CREATININE 1.33 (H) 02/27/2022 0755      Component Value Date/Time   CALCIUM 9.3 02/27/2022 0755   ALKPHOS 93 02/27/2022 0755   AST 27 02/27/2022 0755   ALT 33 02/27/2022 0755   BILITOT 0.4 02/27/2022 0755         Impression and Plan:  51 year old with:  1.  Kidney cancer diagnosed in 2021.  He developed stage IV clear-cell with pulmonary and pituitary involvement  in February 2023.    He continues to tolerate the current therapy without any major complications.  Risks and benefits of continuing this treatment were reviewed.  Complication associated with axitinib including hypertension, hand-foot syndrome, diarrhea as well as weight loss.  I  recommended updating his staging scan before the next visit to determine best course of action.  He is agreeable to proceed.   2.  Right renal mass: Status post cryoablation without any evidence of recurrent disease.   3.  Pituitary tumor: He is status post surgical resection followed by radiation therapy.    4.  Autoimmune complications: He has not experienced any complications at this time.  Including pneumonitis, colitis and thyroid disease.     5.  Follow-up: In 3 weeks for repeat follow-up.   30  minutes were dedicated to this visit.  The time was spent on updating disease status, treatment choices and outlining future plan of care.    Zola Button, MD 12/19/20238:10 AM

## 2022-03-20 NOTE — Patient Instructions (Signed)
[facility] MEDICAL ONCOLOGY  Discharge Instructions: Thank you for choosing [facility] to provide your oncology and hematology care.   If you have a lab appointment with the Cancer Center, please go directly to the Cancer Center and check in at the registration area.   Wear comfortable clothing and clothing appropriate for easy access to any Portacath or PICC line.   We strive to give you quality time with your provider. You may need to reschedule your appointment if you arrive late (15 or more minutes).  Arriving late affects you and other patients whose appointments are after yours.  Also, if you miss three or more appointments without notifying the office, you may be dismissed from the clinic at the provider's discretion.      For prescription refill requests, have your pharmacy contact our office and allow 72 hours for refills to be completed.    Today you received the following chemotherapy and/or immunotherapy agents: Keytruda.       To help prevent nausea and vomiting after your treatment, we encourage you to take your nausea medication as directed.  BELOW ARE SYMPTOMS THAT SHOULD BE REPORTED IMMEDIATELY: *FEVER GREATER THAN 100.4 F (38 C) OR HIGHER *CHILLS OR SWEATING *NAUSEA AND VOMITING THAT IS NOT CONTROLLED WITH YOUR NAUSEA MEDICATION *UNUSUAL SHORTNESS OF BREATH *UNUSUAL BRUISING OR BLEEDING *URINARY PROBLEMS (pain or burning when urinating, or frequent urination) *BOWEL PROBLEMS (unusual diarrhea, constipation, pain near the anus) TENDERNESS IN MOUTH AND THROAT WITH OR WITHOUT PRESENCE OF ULCERS (sore throat, sores in mouth, or a toothache) UNUSUAL RASH, SWELLING OR PAIN  UNUSUAL VAGINAL DISCHARGE OR ITCHING   Items with * indicate a potential emergency and should be followed up as soon as possible or go to the Emergency Department if any problems should occur.  Please show the CHEMOTHERAPY ALERT CARD or IMMUNOTHERAPY ALERT CARD at check-in to  the Emergency Department and triage nurse.  Should you have questions after your visit or need to cancel or reschedule your appointment, please contact [facility]  Dept: [phone]  and follow the prompts.  Office hours are 8:00 a.m. to 4:30 p.m. Monday - Friday. Please note that voicemails left after 4:00 p.m. may not be returned until the following business day.  We are closed weekends and major holidays. You have access to a nurse at all times for urgent questions. Please call the main number to the clinic Dept: [phone] and follow the prompts.   For any non-urgent questions, you may also contact your provider using MyChart. We now offer e-Visits for anyone 18 and older to request care online for non-urgent symptoms. For details visit [email or web address].   Also download the MyChart app! Go to the app store, search "MyChart", open the app, select [facility], and log in with your MyChart username and password.  Masks are optional in the cancer centers. If you would like for your care team to wear a mask while they are taking care of you, please let them know. You may have one support person who is at least 64 years old accompany you for your appointments. 

## 2022-03-21 LAB — T4: T4, Total: 6.4 ug/dL (ref 4.5–12.0)

## 2022-03-22 ENCOUNTER — Telehealth: Payer: Self-pay | Admitting: Oncology

## 2022-03-22 NOTE — Telephone Encounter (Signed)
Called patient regarding ALL upcoming appointments, patient is notified.

## 2022-03-23 ENCOUNTER — Other Ambulatory Visit: Payer: Self-pay | Admitting: Interventional Radiology

## 2022-03-23 DIAGNOSIS — N2889 Other specified disorders of kidney and ureter: Secondary | ICD-10-CM

## 2022-04-03 ENCOUNTER — Ambulatory Visit (HOSPITAL_COMMUNITY)
Admission: RE | Admit: 2022-04-03 | Discharge: 2022-04-03 | Disposition: A | Discharge status: Home / Self Care | Payer: 59 | Source: Ambulatory Visit | Attending: Oncology | Admitting: Oncology

## 2022-04-03 DIAGNOSIS — C642 Malignant neoplasm of left kidney, except renal pelvis: Secondary | ICD-10-CM | POA: Insufficient documentation

## 2022-04-03 MED ORDER — IOHEXOL 300 MG/ML  SOLN
100.0000 mL | Freq: Once | INTRAMUSCULAR | Status: AC | PRN
  Administered 2022-04-03: 100 mL via INTRAVENOUS

## 2022-04-05 ENCOUNTER — Other Ambulatory Visit: Payer: Self-pay | Admitting: Radiation Therapy

## 2022-04-05 ENCOUNTER — Telehealth: Payer: Self-pay

## 2022-04-05 DIAGNOSIS — C7931 Secondary malignant neoplasm of brain: Secondary | ICD-10-CM

## 2022-04-05 NOTE — Telephone Encounter (Signed)
Patient called wanted to know if he was still to have follow up MRI after completing radiation treatment in 01/29/2022 SRS.  States it's been 3 months and no one has called him for follow up MRI. Informed Ashlyn Bruning,PA-C will have [facility] set up MRI appointment and notify patient.

## 2022-04-10 ENCOUNTER — Encounter: Payer: Self-pay | Admitting: Oncology

## 2022-04-10 ENCOUNTER — Inpatient Hospital Stay (HOSPITAL_BASED_OUTPATIENT_CLINIC_OR_DEPARTMENT_OTHER): Payer: 59 | Admitting: Oncology

## 2022-04-10 ENCOUNTER — Inpatient Hospital Stay: Payer: 59

## 2022-04-10 ENCOUNTER — Inpatient Hospital Stay: Payer: 59 | Attending: Oncology

## 2022-04-10 ENCOUNTER — Ambulatory Visit
Admission: RE | Admit: 2022-04-10 | Discharge: 2022-04-10 | Disposition: A | Discharge status: Home / Self Care | Payer: 59 | Source: Ambulatory Visit | Attending: Interventional Radiology | Admitting: Interventional Radiology

## 2022-04-10 VITALS — BP 121/65 | HR 72 | Resp 18

## 2022-04-10 DIAGNOSIS — Z5112 Encounter for antineoplastic immunotherapy: Secondary | ICD-10-CM | POA: Insufficient documentation

## 2022-04-10 DIAGNOSIS — Z79899 Other long term (current) drug therapy: Secondary | ICD-10-CM | POA: Diagnosis not present

## 2022-04-10 DIAGNOSIS — N2889 Other specified disorders of kidney and ureter: Secondary | ICD-10-CM

## 2022-04-10 DIAGNOSIS — C642 Malignant neoplasm of left kidney, except renal pelvis: Secondary | ICD-10-CM | POA: Insufficient documentation

## 2022-04-10 DIAGNOSIS — C7800 Secondary malignant neoplasm of unspecified lung: Secondary | ICD-10-CM | POA: Diagnosis not present

## 2022-04-10 DIAGNOSIS — C7931 Secondary malignant neoplasm of brain: Secondary | ICD-10-CM | POA: Insufficient documentation

## 2022-04-10 LAB — CBC WITH DIFFERENTIAL (CANCER CENTER ONLY)
Abs Immature Granulocytes: 0.01 10*3/uL (ref 0.00–0.07)
Basophils Absolute: 0.1 10*3/uL (ref 0.0–0.1)
Basophils Relative: 1 %
Eosinophils Absolute: 0.4 10*3/uL (ref 0.0–0.5)
Eosinophils Relative: 5 %
HCT: 43.1 % (ref 39.0–52.0)
Hemoglobin: 14.5 g/dL (ref 13.0–17.0)
Immature Granulocytes: 0 %
Lymphocytes Relative: 39 %
Lymphs Abs: 2.7 10*3/uL (ref 0.7–4.0)
MCH: 28.8 pg (ref 26.0–34.0)
MCHC: 33.6 g/dL (ref 30.0–36.0)
MCV: 85.5 fL (ref 80.0–100.0)
Monocytes Absolute: 0.8 10*3/uL (ref 0.1–1.0)
Monocytes Relative: 11 %
Neutro Abs: 3 10*3/uL (ref 1.7–7.7)
Neutrophils Relative %: 44 %
Platelet Count: 251 10*3/uL (ref 150–400)
RBC: 5.04 MIL/uL (ref 4.22–5.81)
RDW: 15 % (ref 11.5–15.5)
WBC Count: 6.8 10*3/uL (ref 4.0–10.5)
nRBC: 0 % (ref 0.0–0.2)

## 2022-04-10 LAB — CMP (CANCER CENTER ONLY)
ALT: 40 U/L (ref 0–44)
AST: 31 U/L (ref 15–41)
Albumin: 4.2 g/dL (ref 3.5–5.0)
Alkaline Phosphatase: 85 U/L (ref 38–126)
Anion gap: 7 (ref 5–15)
BUN: 18 mg/dL (ref 8–23)
CO2: 25 mmol/L (ref 22–32)
Calcium: 8.8 mg/dL — ABNORMAL LOW (ref 8.9–10.3)
Chloride: 107 mmol/L (ref 98–111)
Creatinine: 1.2 mg/dL (ref 0.61–1.24)
GFR, Estimated: >60 mL/min (ref >60)
Glucose, Bld: 91 mg/dL (ref 70–99)
Potassium: 4.1 mmol/L (ref 3.5–5.1)
Sodium: 139 mmol/L (ref 135–145)
Total Bilirubin: 0.5 mg/dL (ref 0.3–1.2)
Total Protein: 7.2 g/dL (ref 6.5–8.1)

## 2022-04-10 LAB — TSH: TSH: <0.01 u[IU]/mL — ABNORMAL LOW (ref 0.350–4.500)

## 2022-04-10 MED ORDER — SODIUM CHLORIDE 0.9 % IV SOLN
200.0000 mg | Freq: Once | INTRAVENOUS | Status: AC
  Administered 2022-04-10: 200 mg via INTRAVENOUS
  Filled 2022-04-10: qty 200

## 2022-04-10 MED ORDER — SODIUM CHLORIDE 0.9 % IV SOLN: Freq: Once | INTRAVENOUS | Status: AC

## 2022-04-10 NOTE — Progress Notes (Signed)
Hematology and Oncology Follow Up Visit  Marc [name] [id number] December 01, 1955 67 y.o. 04/10/2022 10:34 AM Marc [name], [name], MD   Principle Diagnosis: 52 year old man with kidney cancer diagnosed in 2021.  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     Prior Therapy:  He underwent robotic assisted laparoscopic left radical nephrectomy by Dr. Lovena Neighbours on January 05, 2020.  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  The final pathological staging was T3aN0 grade 2 tumor.    He is status post radiation therapy to the pulmonary nodules completed March 2023.  He received 50 Gray in 5 fractions.  He is status post cryoablation to 2 lesions in the right kidney completed on Aug 23, 2021.  He is s/p endoscopic transsphenoidal pituitary resection completed by Dr. Wilburn Cornelia on November 08, 2021.  The final pathology showed clear-cell renal cell carcinoma.   SRS treatment to pituitary bed completed in October 2023.  He received 5 fractions for a total of 25 Gray.  Current therapy:   Pembrolizumab 200 mg every 3 weeks started on Aug 04, 2021.  Axitinib 5 mg daily started on February 09, 2022.  He presents for a follow-up evaluation.    Interim History: [name] returns today for a repeat follow-up.  Since the last visit, he reports feeling well without any major complaints.  He denies any nausea vomiting or abdominal pain.  He denies any hospitalizations or illnesses.  His performance status quality of life remains unchanged.  He denies any diarrhea, fatigue or skin rash.  He tolerated axitinib at the current dose without any issues.     Medications: Updated on review. Current Outpatient Medications  Medication Sig Dispense Refill   Ascorbic Acid (VITAMIN C PO) Take 1 tablet by  mouth every evening.     aspirin EC 81 MG tablet Take 81 mg by mouth every evening. Swallow whole.     atorvastatin (LIPITOR) 10 MG tablet Take 10 mg by mouth every evening.     axitinib (INLYTA) 5 MG tablet Take 1 tablet (5 mg total) by mouth daily. 30 tablet 3   cholecalciferol (VITAMIN D3) 25 MCG (1000 UNIT) tablet Take 1,000 Units by mouth every evening.     desmopressin (DDAVP) 0.1 MG tablet Take 0.5 tablets (0.05 mg total) by mouth 2 (two) times daily. 60 tablet 2   Ferrous Sulfate (IRON SLOW RELEASE PO) Take 1 tablet by mouth every evening.     HYDROcodone-acetaminophen (NORCO) 5-325 MG tablet Take 1-2 tablets by mouth every 6 (six) hours as needed for moderate pain. (Patient not taking: Reported on 12/22/2021) 20 tablet 0   HYDROcodone-acetaminophen (NORCO/VICODIN) 5-325 MG tablet Take 1 tablet by mouth every 4 (four) hours as needed for moderate pain. (Patient not taking: Reported on 12/22/2021) 30 tablet 0   hydrocortisone (CORTEF) 10 MG tablet TAKE 1.5 TABLETS BY MOUTH EVERY MORNING AND 1 TABLET AT 4PM FOR PAIN 75 tablet 2   ibuprofen (ADVIL) 200 MG tablet Take 400-600 mg by mouth every 6 (six) hours as needed for moderate pain.     levothyroxine (SYNTHROID) 88 MCG tablet Take 1 tablet (88 mcg total) by mouth daily. 90 tablet 3   Multiple Vitamins-Minerals (MULTIVITAMIN WITH MINERALS) tablet Take 1 tablet by mouth every evening.     Omega-3 Fatty Acids (FISH  OIL PO) Take 1 capsule by mouth every evening.     prochlorperazine (COMPAZINE) 10 MG tablet Take 1 tablet (10 mg total) by mouth every 6 (six) hours as needed for nausea or vomiting. 30 tablet 0   Testosterone (ANDROGEL) 20.25 MG/1.25GM (1.62%) GEL Place 1 Pump onto the skin as directed. 1 pump to each shoulder daily 75 g 5   vitamin E 180 MG (400 UNITS) capsule Take 400 Units by mouth every evening.     No current facility-administered medications for this visit.     Allergies: No Known Allergies    Physical  Exam:     Blood pressure 134/86, pulse 82, temperature 98.6 F (37 C), temperature source Temporal, resp. rate 19, height '5\' 8"'$  (1.727 m), weight 250 lb 11.2 oz (113.7 kg), SpO2 97 %.    ECOG: 1    General appearance: Alert, awake without any distress. Head: Atraumatic without abnormalities Oropharynx: Without any thrush or ulcers. Eyes: No scleral icterus. Lymph nodes: No lymphadenopathy noted in the cervical, supraclavicular, or axillary nodes Heart:regular rate and rhythm, without any murmurs or gallops.   Lung: Clear to auscultation without any rhonchi, wheezes or dullness to percussion. Abdomin: Soft, nontender without any shifting dullness or ascites. Musculoskeletal: No clubbing or cyanosis. Neurological: No motor or sensory deficits. Skin: No rashes or lesions.  .      Lab Results: Lab Results  Component Value Date   WBC 8.4 03/20/2022   HGB 14.8 03/20/2022   HCT 43.7 03/20/2022   MCV 85.0 03/20/2022   PLT 259 03/20/2022   PSA 0.00 (L) 06/20/2021     Chemistry      Component Value Date/Time   NA 142 03/20/2022 0805   K 4.2 03/20/2022 0805   CL 108 03/20/2022 0805   CO2 24 03/20/2022 0805   BUN 22 03/20/2022 0805   CREATININE 1.15 03/20/2022 0805      Component Value Date/Time   CALCIUM 8.9 03/20/2022 0805   ALKPHOS 92 03/20/2022 0805   AST 34 03/20/2022 0805   ALT 39 03/20/2022 0805   BILITOT 0.8 03/20/2022 0805      IMPRESSION: 1. The dominant pleural-based mass in the inferior medial right hemithorax has mildly increased in size from the most recent prior study, suspicious for progressive metastatic disease. 2. New small subpleural nodule along the posterior aspect of the right hemidiaphragm is nonspecific. Other scattered tiny pulmonary nodules are unchanged. 3. No other evidence of metastatic disease within the chest, abdomen or pelvis. 4. Stable appearance of ablation defect posteriorly in the upper pole of the right kidney. No  evidence of local recurrence. An adjacent indeterminate low-density lesion in the posterior interpolar region of the right kidney is unchanged in size. Continued attention on follow-up recommended. 5. 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 Attention on follow-up recommended. 6. Cholelithiasis without evidence of cholecystitis or biliary dilatation. 7.  Aortic Atherosclerosis (ICD10-I70.0).     Impression and Plan:  67 year old with:  1.  Stage IV clear-cell renal cell carcinoma with pulmonary and pituitary involvement diagnosed in February 2023.    CT scan obtained on April 03, 2022 was personally reviewed and showed overall stable disease without any major progression.  The index lesion in right lung remains about the same size with central necrosis indicating positive response to therapy.  There are small pulmonary nodules that we will warrant monitoring in the future.  Risks and benefits of continuing this treatment  were discussed at this time.  Alternative treatment options would be switching to Cabometyx or Lenvima.  For the time being I recommended continuing Pembrolizumab and axitinib for the time being.  He is agreeable to continue at this time.   2.  Right renal mass: No evidence of recurrent disease after cryoablation.  CT scan in January 2024 continues to show positive response.  He continues to follow with interventional radiology regarding this issue.   3.  Pituitary tumor: No evidence of recurrent disease.  He will have repeat MRI in January 2024 follow-up with radiation oncology.  He had surgical resection followed by radiation.    4.  Autoimmune complications: I continue to educate him about these complications including pneumonitis, colitis and thyroid disease.  He has not experienced any at this time.     5.  Follow-up: He will follow-up in 3 weeks for repeat evaluation.   30  minutes were spent on  this encounter.  Time was dedicated to reviewing imaging studies, treatment choices and outlining future plan of care discussion.    Zola Button, MD 1/9/202410:34 AM

## 2022-04-10 NOTE — Progress Notes (Signed)
Patient ID: Marc [name], male   DOB: [birth year], 67 y.o.   MRN: [id number]       Chief Complaint:  Metastatic renal cell carcinoma  Referring Physician(s): Hassell,Daniel  History of Present Illness: Marc [name] is a 67 y.o. male with stage IV metastatic renal cell carcinoma.  He is status post left nephrectomy and adrenalectomy for a large left renal cell carcinoma in October 2021.  Pathology confirmed clear-cell carcinoma.  Surveillance imaging demonstrated metastatic disease with 2 enlarging pulmonary nodule status post SBRT.  He also underwent transsphenoidal resection of a pituitary metastatic lesion and radiation to this area.  Surveillance imaging confirmed 2 suspicious enlarging right renal cortical masses.  These also were consistent with renal neoplasm or metastatic disease by imaging.  These underwent successful image guided cryoablation 08/23/2021.  He continues to undergo chemotherapy/immunotherapy under Dr. Alen Blew.  No further radiation at this time.  Overall he is feeling well.  No significant abdominal pain or flank pain.  No recent illness or fevers.  Stable weight.  Interval surveillance CTs confirms stable ablation changes of the right kidney upper pole.  No signs of residual recurrent disease.  No suspicious marginal enhancement or irregularity.  No new metastatic disease in the abdomen or pelvis.  Dominant right pleural based metastatic lesion along the inferior medial right hemithorax measures slightly larger however is more hypodense and has less enhancement when compared to October 2023.  This may be related to necrosis versus treatment response.  This will be closely followed on additional surveillance imaging.  Past Medical History:  Diagnosis Date   Arthritis    Cancer ([place])    Kidney, Lung, Pituitary   Hyperlipidemia    Hypothyroidism     Past Surgical History:  Procedure Laterality Date   CRANIOTOMY N/A 11/08/2021   Procedure: ENDOSCOPIC ENDONASAL  RESECTION OF SELLAR MASS;  Surgeon: Judith Part, MD;  Location: [facility];  Service: Neurosurgery;  Laterality: N/A;  RM 20   IR RADIOLOGIST EVAL & MGMT  07/14/2021   IR RADIOLOGIST EVAL & MGMT  07/27/2021   IR RADIOLOGIST EVAL & MGMT  01/30/2022   RADIOFREQUENCY ABLATION N/A 08/23/2021   Procedure: RENAL CRYO ABLATION;  Surgeon: Greggory Keen, MD;  Location: [facility];  Service: Anesthesiology;  Laterality: N/A;   right shoulder rotator cuff repair Right    ROBOT ASSISTED LAPAROSCOPIC NEPHRECTOMY Left 01/05/2020   Procedure: XI ROBOTIC ASSISTED LAPAROSCOPIC RADICAL NEPHRECTOMY;  Surgeon: Ceasar Mons, MD;  Location: [facility];  Service: Urology;  Laterality: Left;   TOTAL HIP ARTHROPLASTY Left 03/31/2020   Procedure: LEFT TOTAL HIP ARTHROPLASTY ANTERIOR APPROACH;  Surgeon: Mcarthur Rossetti, MD;  Location: [facility];  Service: Orthopedics;  Laterality: Left;   TRANSPHENOIDAL APPROACH EXPOSURE N/A 11/08/2021   Procedure: TRANSPHENOIDAL APPROACH EXPOSURE;  Surgeon: Jerrell Belfast, MD;  Location: [facility];  Service: ENT;  Laterality: N/A;    Allergies: Patient has no known allergies.  Medications: Prior to Admission medications   Medication Sig Start Date End Date Taking? Authorizing Provider  Ascorbic Acid (VITAMIN C PO) Take 1 tablet by mouth every evening.    [provider]  aspirin EC 81 MG tablet Take 81 mg by mouth every evening. Swallow whole.    [provider]  atorvastatin (LIPITOR) 10 MG tablet Take 10 mg by mouth every evening.    [provider]  axitinib (INLYTA) 5 MG tablet Take 1 tablet (5 mg total) by mouth daily. 02/06/22   [name],  MD  cholecalciferol (VITAMIN D3) 25 MCG (1000 UNIT) tablet Take 1,000 Units by mouth every evening.    [provider]  desmopressin (DDAVP) 0.1 MG tablet Take 0.5 tablets (0.05 mg total) by mouth 2 (two) times daily. 11/11/21   Judith Part, MD  Ferrous Sulfate (IRON SLOW RELEASE PO) Take  1 tablet by mouth every evening.    [provider]  HYDROcodone-acetaminophen (NORCO) 5-325 MG tablet Take 1-2 tablets by mouth every 6 (six) hours as needed for moderate pain. Patient not taking: Reported on 12/22/2021 08/23/21   Allred, Darrell K, PA-C  HYDROcodone-acetaminophen (NORCO/VICODIN) 5-325 MG tablet Take 1 tablet by mouth every 4 (four) hours as needed for moderate pain. Patient not taking: Reported on 12/22/2021 11/11/21   Judith Part, MD  hydrocortisone (CORTEF) 10 MG tablet TAKE 1.5 TABLETS BY MOUTH EVERY MORNING AND 1 TABLET AT 4PM FOR PAIN 02/08/22   Shamleffer, Melanie Crazier, MD  ibuprofen (ADVIL) 200 MG tablet Take 400-600 mg by mouth every 6 (six) hours as needed for moderate pain.    [provider]  levothyroxine (SYNTHROID) 88 MCG tablet Take 1 tablet (88 mcg total) by mouth daily. 12/24/21   Shamleffer, Melanie Crazier, MD  Multiple Vitamins-Minerals (MULTIVITAMIN WITH MINERALS) tablet Take 1 tablet by mouth every evening.    [provider]  Omega-3 Fatty Acids (FISH OIL PO) Take 1 capsule by mouth every evening.    [provider]  prochlorperazine (COMPAZINE) 10 MG tablet Take 1 tablet (10 mg total) by mouth every 6 (six) hours as needed for nausea or vomiting. 08/31/21   [name], MD  Testosterone (ANDROGEL) 20.25 MG/1.25GM (1.62%) GEL Place 1 Pump onto the skin as directed. 1 pump to each shoulder daily 12/22/21   Shamleffer, Melanie Crazier, MD  vitamin E 180 MG (400 UNITS) capsule Take 400 Units by mouth every evening.    [provider]     No family history on file.  Social History   Socioeconomic History   Marital status: Single    Spouse name: Not on file   Number of children: 2   Years of education: Not on file   Highest education level: Not on file  Occupational History   Not on file  Tobacco Use   Smoking status: Former    Years: 30.00    Types: Cigarettes    Quit date: 04/03/2015    Years since  quitting: 7.0   Smokeless tobacco: Never  Vaping Use   Vaping Use: Former   Quit date: 04/16/2021   Substances: Nicotine, Flavoring  Substance and Sexual Activity   Alcohol use: Yes    Comment: occas    Drug use: No   Sexual activity: Not on file  Other Topics Concern   Not on file  Social History Narrative   Not on file   Social Determinants of Health   Financial Resource Strain: Not on file  Food Insecurity: Not on file  Transportation Needs: Not on file  Physical Activity: Not on file  Stress: Not on file  Social Connections: Not on file    ECOG Status: 1 - Symptomatic but completely ambulatory  Review of Systems  Review of Systems: A 12 point ROS discussed and pertinent positives are indicated in the HPI above.  All other systems are negative.    Physical Exam No direct physical exam was performed Telehealth visit only today to review surveillance imaging  Vital Signs: There were no vitals taken for  this visit.  Imaging: CT Chest W Contrast  Result Date: 04/05/2022 CLINICAL DATA:  Kidney cancer, recurrence. History of left renal carcinoma post nephrectomy and adrenalectomy. Right renal cell carcinoma post percutaneous cryoablation and immunotherapy. * Tracking Code: BO * EXAM: CT CHEST WITH CONTRAST CT ABDOMEN AND PELVIS WITH AND WITHOUT CONTRAST TECHNIQUE: Multidetector CT imaging of the chest was performed during intravenous contrast administration. Multidetector CT imaging of the abdomen and pelvis was performed following the standard protocol before and during bolus administration of intravenous contrast. RADIATION DOSE REDUCTION: This exam was performed according to the departmental dose-optimization program which includes automated exposure control, adjustment of the mA and/or kV according to patient size and/or use of iterative reconstruction technique. CONTRAST:  119m OMNIPAQUE IOHEXOL 300 MG/ML  SOLN COMPARISON:  Prior CTs 01/09/2022 and 05/08/2021. PET-CT  05/25/2021. FINDINGS: CT CHEST FINDINGS Cardiovascular: No acute vascular findings. There is atherosclerosis of the aorta, great vessels and coronary arteries. The heart size is normal. There is no pericardial effusion. Mediastinum/Nodes: There are no enlarged mediastinal, hilar or axillary lymph nodes. The thyroid gland, trachea and esophagus demonstrate no significant findings. Lungs/Pleura: No pleural effusion or pneumothorax. Right inferior medial pleural based mass measures 3.9 x 2.2 cm on image 78/506 (previously 3.5 x 1.9 cm). This measures up to 4.1 cm on coronal image 68/507 (previously 3.3 cm). The central attenuation of this lesion appears slightly lower, suggesting possible treatment related necrosis. Treatment related changes along the right major fissure and anterior aspect of the right lower lobe are again noted with associated bandlike scarring. A previously demonstrated small right lower lobe nodule has nearly resolved (faintly visible on image 117 of series 505). There is a new 5 mm subpleural nodule along the posterior aspect of the right hemidiaphragm on image 114/505. Other scattered tiny pulmonary nodules bilaterally are unchanged. No suspicious left lung nodules. Musculoskeletal/Chest wall: No chest wall mass or suspicious osseous findings. Right shoulder muscular atrophy noted. CT ABDOMEN AND PELVIS FINDINGS Hepatobiliary: The liver is normal in density without suspicious focal abnormality. Tiny gallstones are again noted. No evidence of gallbladder wall thickening or biliary dilatation. Pancreas: Stable 8 mm cystic lesion in the pancreatic neck without abnormal enhancement. No pancreatic ductal dilatation or surrounding inflammation. Spleen: Normal in size without focal abnormality. Adrenals/Urinary Tract: Status post left nephrectomy and adrenalectomy without recurrent mass lesion in the nephrectomy bed. The right adrenal gland appears normal. Cryo ablation defect posteriorly in the upper  pole of the right kidney measures 4.1 x 2.2 cm on image 106/506 and demonstrates no abnormal enhancement. Adjacent 1.4 cm low-density lesion posteriorly in the interpolar region of the right kidney is unchanged and demonstrates no definite enhancement following contrast. No new or enlarging renal masses. No evidence of urinary tract calculus or hydronephrosis. The bladder appears unremarkable for its degree of distention. Stomach/Bowel: No enteric contrast administered. The stomach appears unremarkable for its degree of distension. No evidence of bowel wall thickening, distention or surrounding inflammatory change. The appendix appears normal. There are sigmoid colon diverticular changes. Vascular/Lymphatic: There are no enlarged abdominal or pelvic lymph nodes. Mild aortic and branch vessel atherosclerosis without evidence of aneurysm or large vessel occlusion. Reproductive: The prostate gland is partially obscured by artifact from the left total hip arthroplasty. There are calcifications within and surrounding the prostate gland which appear unchanged. No mass lesion identified. Other: Intact abdominal wall.  No ascites or peritoneal nodularity. Musculoskeletal: Status post left total hip arthroplasty. Mild multilevel spondylosis. No evidence of osseous metastatic  disease or acute osseous abnormality. IMPRESSION: 1. The dominant pleural-based mass in the inferior medial right hemithorax has mildly increased in size from the most recent prior study, suspicious for progressive metastatic disease. 2. New small subpleural nodule along the posterior aspect of the right hemidiaphragm is nonspecific. Other scattered tiny pulmonary nodules are unchanged. 3. No other evidence of metastatic disease within the chest, abdomen or pelvis. 4. Stable appearance of ablation defect posteriorly in the upper pole of the right kidney. No evidence of local recurrence. An adjacent indeterminate low-density lesion in the posterior  interpolar region of the right kidney is unchanged in size. Continued attention on follow-up recommended. 5. 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 Attention on follow-up recommended. 6. Cholelithiasis without evidence of cholecystitis or biliary dilatation. 7.  Aortic Atherosclerosis (ICD10-I70.0). Electronically Signed   By: Richardean Sale M.D.   On: 04/05/2022 09:23   CT Abdomen Pelvis W Wo Contrast  Result Date: 04/05/2022 CLINICAL DATA:  Kidney cancer, recurrence. History of left renal carcinoma post nephrectomy and adrenalectomy. Right renal cell carcinoma post percutaneous cryoablation and immunotherapy. * Tracking Code: BO * EXAM: CT CHEST WITH CONTRAST CT ABDOMEN AND PELVIS WITH AND WITHOUT CONTRAST TECHNIQUE: Multidetector CT imaging of the chest was performed during intravenous contrast administration. Multidetector CT imaging of the abdomen and pelvis was performed following the standard protocol before and during bolus administration of intravenous contrast. RADIATION DOSE REDUCTION: This exam was performed according to the departmental dose-optimization program which includes automated exposure control, adjustment of the mA and/or kV according to patient size and/or use of iterative reconstruction technique. CONTRAST:  175m OMNIPAQUE IOHEXOL 300 MG/ML  SOLN COMPARISON:  Prior CTs 01/09/2022 and 05/08/2021. PET-CT 05/25/2021. FINDINGS: CT CHEST FINDINGS Cardiovascular: No acute vascular findings. There is atherosclerosis of the aorta, great vessels and coronary arteries. The heart size is normal. There is no pericardial effusion. Mediastinum/Nodes: There are no enlarged mediastinal, hilar or axillary lymph nodes. The thyroid gland, trachea and esophagus demonstrate no significant findings. Lungs/Pleura: No pleural effusion or pneumothorax. Right inferior medial pleural based mass measures 3.9 x 2.2 cm on image 78/506 (previously 3.5 x  1.9 cm). This measures up to 4.1 cm on coronal image 68/507 (previously 3.3 cm). The central attenuation of this lesion appears slightly lower, suggesting possible treatment related necrosis. Treatment related changes along the right major fissure and anterior aspect of the right lower lobe are again noted with associated bandlike scarring. A previously demonstrated small right lower lobe nodule has nearly resolved (faintly visible on image 117 of series 505). There is a new 5 mm subpleural nodule along the posterior aspect of the right hemidiaphragm on image 114/505. Other scattered tiny pulmonary nodules bilaterally are unchanged. No suspicious left lung nodules. Musculoskeletal/Chest wall: No chest wall mass or suspicious osseous findings. Right shoulder muscular atrophy noted. CT ABDOMEN AND PELVIS FINDINGS Hepatobiliary: The liver is normal in density without suspicious focal abnormality. Tiny gallstones are again noted. No evidence of gallbladder wall thickening or biliary dilatation. Pancreas: Stable 8 mm cystic lesion in the pancreatic neck without abnormal enhancement. No pancreatic ductal dilatation or surrounding inflammation. Spleen: Normal in size without focal abnormality. Adrenals/Urinary Tract: Status post left nephrectomy and adrenalectomy without recurrent mass lesion in the nephrectomy bed. The right adrenal gland appears normal. Cryo ablation defect posteriorly in the upper pole of the right kidney measures 4.1 x 2.2 cm on image 106/506 and demonstrates no abnormal enhancement. Adjacent 1.4  cm low-density lesion posteriorly in the interpolar region of the right kidney is unchanged and demonstrates no definite enhancement following contrast. No new or enlarging renal masses. No evidence of urinary tract calculus or hydronephrosis. The bladder appears unremarkable for its degree of distention. Stomach/Bowel: No enteric contrast administered. The stomach appears unremarkable for its degree of  distension. No evidence of bowel wall thickening, distention or surrounding inflammatory change. The appendix appears normal. There are sigmoid colon diverticular changes. Vascular/Lymphatic: There are no enlarged abdominal or pelvic lymph nodes. Mild aortic and branch vessel atherosclerosis without evidence of aneurysm or large vessel occlusion. Reproductive: The prostate gland is partially obscured by artifact from the left total hip arthroplasty. There are calcifications within and surrounding the prostate gland which appear unchanged. No mass lesion identified. Other: Intact abdominal wall.  No ascites or peritoneal nodularity. Musculoskeletal: Status post left total hip arthroplasty. Mild multilevel spondylosis. No evidence of osseous metastatic disease or acute osseous abnormality. IMPRESSION: 1. The dominant pleural-based mass in the inferior medial right hemithorax has mildly increased in size from the most recent prior study, suspicious for progressive metastatic disease. 2. New small subpleural nodule along the posterior aspect of the right hemidiaphragm is nonspecific. Other scattered tiny pulmonary nodules are unchanged. 3. No other evidence of metastatic disease within the chest, abdomen or pelvis. 4. Stable appearance of ablation defect posteriorly in the upper pole of the right kidney. No evidence of local recurrence. An adjacent indeterminate low-density lesion in the posterior interpolar region of the right kidney is unchanged in size. Continued attention on follow-up recommended. 5. 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 Attention on follow-up recommended. 6. Cholelithiasis without evidence of cholecystitis or biliary dilatation. 7.  Aortic Atherosclerosis (ICD10-I70.0). Electronically Signed   By: Richardean Sale M.D.   On: 04/05/2022 09:23    Labs:  CBC: Recent Labs    02/06/22 0821 02/27/22 0755 03/20/22 0805 04/10/22 1019   WBC 7.5 8.8 8.4 6.8  HGB 13.2 14.2 14.8 14.5  HCT 40.1 42.0 43.7 43.1  PLT 256 248 259 251    COAGS: Recent Labs    08/23/21 0849  INR 1.1    BMP: Recent Labs    01/16/22 0751 02/06/22 0821 02/27/22 0755 03/20/22 0805  NA 140 142 139 142  K 4.1 4.3 4.0 4.2  CL 107 107 105 108  CO2 '25 28 26 24  '$ GLUCOSE 105* 96 108* 100*  BUN '18 20 19 22  '$ CALCIUM 9.0 9.2 9.3 8.9  CREATININE 1.31* 1.29* 1.33* 1.15  GFRNONAA >60 >60 60* >60    LIVER FUNCTION TESTS: Recent Labs    01/16/22 0751 02/06/22 0821 02/27/22 0755 03/20/22 0805  BILITOT 0.3 0.3 0.4 0.8  AST '25 26 27 '$ 34  ALT 28 31 33 39  ALKPHOS 85 84 93 92  PROT 7.1 7.1 7.5 7.7  ALBUMIN 4.2 4.2 4.3 4.2     Assessment and Plan:  8 month status post CT-guided cryoablation of 2 right renal upper pole neoplasm suspicious for additional renal cell carcinomas versus metastatic disease to the right kidney.  Patient has stage IV metastatic renal cell carcinoma.  Surveillance imaging confirms a stable ablation defect in the right kidney upper pole.  No residual or recurrent disease.  No suspicious marginal nodular enhancement.  No new renal abnormality.  From a renal cryoablation standpoint he is doing very well.  Plan: Repeat surveillance CT imaging in 3 months with contrast.  This will be scheduled with Dr. Alen Blew who also follows him closely as he is still receiving immunotherapy/chemotherapy.      Electronically Signed: Greggory Keen 04/10/2022, 10:44 AM   I spent a total of    40 Minutes in remote  clinical consultation, greater than 50% of which was counseling/coordinating care for This patient with metastatic renal cell carcinoma status post cryoablation.    Visit type: Audio only (telephone). Audio (no video) only due to patient's lack of internet/smartphone capability. Alternative for in-person consultation at [facility], [place] [address], [place], [place]. This visit type was conducted due to national  recommendations for restrictions regarding the COVID-19 Pandemic (e.g. social distancing).  This format is felt to be most appropriate for this patient at this time.  All issues noted in this document were discussed and addressed.

## 2022-04-10 NOTE — Patient Instructions (Signed)
[facility] MEDICAL [facility]COLOG[facility]  Discharge Instructions: Thank you for choosing [facility] to provide your oncology and hematology care.   If you have a lab appointment with the Cancer Center, please go directly to the Cancer Center and check in at the registration area.   Wear comfortable clothing and clothing appropriate for easy access to any Portacath or PICC line.   We strive to give you quality time with your provider. You may need to reschedule your appointment if you arrive late (15 or more minutes).  Arriving late affects you and other patients whose appointments are after yours.  Also, if you miss three or more appointments without notifying the office, you may be dismissed from the clinic at the provider's discretion.      For prescription refill requests, have your pharmacy contact our office and allow 72 hours for refills to be completed.    Today you received the following chemotherapy and/or immunotherapy agents: Keytruda.       To help prevent nausea and vomiting after your treatment, we encourage you to take your nausea medication as directed.  BELOW ARE SYMPTOMS THAT SHOULD BE REPORTED IMMEDIATELY: *FEVER GREATER THAN 100.4 F (38 C) OR HIGHER *CHILLS OR SWEATING *NAUSEA AND VOMITING THAT IS NOT CONTROLLED WITH YOUR NAUSEA MEDICATION *UNUSUAL SHORTNESS OF BREATH *UNUSUAL BRUISING OR BLEEDING *URINARY PROBLEMS (pain or burning when urinating, or frequent urination) *BOWEL PROBLEMS (unusual diarrhea, constipation, pain near the anus) TENDERNESS IN MOUTH AND THROAT WITH OR WITHOUT PRESENCE OF ULCERS (sore throat, sores in mouth, or a toothache) UNUSUAL RASH, SWELLING OR PAIN  UNUSUAL VAGINAL DISCHARGE OR ITCHING   Items with * indicate a potential emergency and should be followed up as soon as possible or go to the Emergency Department if any problems should occur.  Please show the CHEMOTHERAPY ALERT CARD or IMMUNOTHERAPY ALERT CARD at check-in to  the Emergency Department and triage nurse.  Should you have questions after your visit or need to cancel or reschedule your appointment, please contact [facility]  Dept: [phone]  and follow the prompts.  Office hours are 8:00 a.m. to 4:30 p.m. Monday - Friday. Please note that voicemails left after 4:00 p.m. may not be returned until the following business day.  We are closed weekends and major holidays. You have access to a nurse at all times for urgent questions. Please call the main number to the clinic Dept: [phone] and follow the prompts.   For any non-urgent questions, you may also contact your provider using MyChart. We now offer e-Visits for anyone 18 and older to request care online for non-urgent symptoms. For details visit [email or web address].   Also download the MyChart app! Go to the app store, search "MyChart", open the app, select [facility], and log in with your MyChart username and password.  Masks are optional in the cancer centers. If you would like for your care team to wear a mask while they are taking care of you, please let them know. You may have one support person who is at least 65 years old accompany you for your appointments. 

## 2022-04-11 LAB — T4: T4, Total: 5.9 ug/dL (ref 4.5–12.0)

## 2022-04-26 ENCOUNTER — Ambulatory Visit
Admission: RE | Admit: 2022-04-26 | Discharge: 2022-04-26 | Disposition: A | Discharge status: Home / Self Care | Payer: 59 | Source: Ambulatory Visit | Attending: Radiation Oncology | Admitting: Radiation Oncology

## 2022-04-26 DIAGNOSIS — C7931 Secondary malignant neoplasm of brain: Secondary | ICD-10-CM

## 2022-04-26 MED ORDER — GADOPICLENOL 0.5 MMOL/ML IV SOLN
10.0000 mL | Freq: Once | INTRAVENOUS | Status: AC | PRN
  Administered 2022-04-26: 10 mL via INTRAVENOUS

## 2022-04-27 NOTE — Progress Notes (Unsigned)
[facility] OFFICE PROGRESS NOTE  [name], [place], [place] [address] [place] [address]  DIAGNOSIS: 67 year old man with kidney cancer diagnosed in 2021.  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   PRIOR THERAPY: 1) He underwent robotic assisted laparoscopic left radical nephrectomy by Dr. Lovena Neighbours on January 05, 2020.  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  The final pathological staging was T3aN0 grade 2 tumor.     2) He is status post radiation therapy to the pulmonary nodules completed March 2023.  He received 50 Gray in 5 fractions.   3) He is status post cryoablation to 2 lesions in the right kidney completed on Aug 23, 2021.   4) He is s/p endoscopic transsphenoidal pituitary resection completed by Dr. Wilburn Cornelia on November 08, 2021.  The final pathology showed clear-cell renal cell carcinoma.   5) SRS treatment to pituitary bed completed in October 2023.  He received 5 fractions for a total of 25 Gray.    CURRENT THERAPY: Pembrolizumab 200 mg every 3 weeks started on Aug 04, 2021.  Axitinib 5 mg daily started on February 09, 2022.  He presents for a follow-up evaluation.   INTERVAL HISTORY: Marc [name] 67 y.o. male returns to the clinic today for a follow-up visit.  The patient is feeling fairly well today without any concerning complaints.  He is currently undergoing treatment with IV immunotherapy with Keytruda every 3 weeks and Axitinib p.o. daily.  The patient is tolerating treatment well without any concerning adverse side effects.  The patient denies any recent fever, chills, night sweats, unexplained weight loss. He denies any appetite changes. He denies any rashes or skin changes.  Denies any chest pain, shortness of breath, cough, or  hemoptysis.  Denies any nausea, vomiting, diarrhea, or constipation.  Denies any abdominal pain.  Denies any dysuria, malodorous urine, urinary frequency or urgency.  Denies any headache or visual changes.  He recently had a brain MRI performed by Dr. Tammi Klippel which showed no new findings. He has a telephone follow up with Ashlynn tomorrow.  He is here today for evaluation repeat blood work before undergoing his next cycle of treatment with cycle #8.  MEDICAL HISTORY: Past Medical History:  Diagnosis Date   Arthritis    Cancer ([place])    Kidney, Lung, Pituitary   Hyperlipidemia    Hypothyroidism     ALLERGIES:  has No Known Allergies.  MEDICATIONS:  Current Outpatient Medications  Medication Sig Dispense Refill   Ascorbic Acid (VITAMIN C PO) Take 1 tablet by mouth every evening.     aspirin EC 81 MG tablet Take 81 mg by mouth every evening. Swallow whole.     atorvastatin (LIPITOR) 10 MG tablet Take 10 mg by mouth every evening.     axitinib (INLYTA) 5 MG tablet Take 1 tablet (5 mg total) by mouth daily. 30 tablet 3   cholecalciferol (VITAMIN D3) 25 MCG (1000 UNIT) tablet Take 1,000 Units by mouth every evening.     desmopressin (DDAVP) 0.1 MG tablet Take 0.5 tablets (0.05 mg total) by mouth 2 (two) times daily. 60 tablet 2   Ferrous Sulfate (IRON SLOW RELEASE PO) Take 1 tablet by mouth every evening.     HYDROcodone-acetaminophen (NORCO) 5-325 MG tablet Take 1-2 tablets by mouth  every 6 (six) hours as needed for moderate pain. (Patient not taking: Reported on 12/22/2021) 20 tablet 0   HYDROcodone-acetaminophen (NORCO/VICODIN) 5-325 MG tablet Take 1 tablet by mouth every 4 (four) hours as needed for moderate pain. (Patient not taking: Reported on 12/22/2021) 30 tablet 0   hydrocortisone (CORTEF) 10 MG tablet TAKE 1.5 TABLETS BY MOUTH EVERY MORNING AND 1 TABLET AT 4PM FOR PAIN 75 tablet 2   ibuprofen (ADVIL) 200 MG tablet Take 400-600 mg by mouth every 6 (six) hours as needed for moderate pain.      levothyroxine (SYNTHROID) 88 MCG tablet Take 1 tablet (88 mcg total) by mouth daily. 90 tablet 3   Multiple Vitamin (MULTI VITAMIN) TABS 1 tablet Orally Once a day for 30 day(s)     Multiple Vitamins-Minerals (MULTIVITAMIN WITH MINERALS) tablet Take 1 tablet by mouth every evening.     Omega-3 Fatty Acids (FISH OIL PO) Take 1 capsule by mouth every evening.     prochlorperazine (COMPAZINE) 10 MG tablet Take 1 tablet (10 mg total) by mouth every 6 (six) hours as needed for nausea or vomiting. 30 tablet 0   Testosterone (ANDROGEL) 20.25 MG/1.25GM (1.62%) GEL Place 1 Pump onto the skin as directed. 1 pump to each shoulder daily 75 g 5   vitamin E 180 MG (400 UNITS) capsule Take 400 Units by mouth every evening.     No current facility-administered medications for this visit.    SURGICAL HISTORY:  Past Surgical History:  Procedure Laterality Date   CRANIOTOMY N/A 11/08/2021   Procedure: ENDOSCOPIC ENDONASAL RESECTION OF SELLAR MASS;  Surgeon: Judith Part, MD;  Location: [facility];  Service: Neurosurgery;  Laterality: N/A;  RM 20   IR RADIOLOGIST EVAL & MGMT  07/14/2021   IR RADIOLOGIST EVAL & MGMT  07/27/2021   IR RADIOLOGIST EVAL & MGMT  01/30/2022   RADIOFREQUENCY ABLATION N/A 08/23/2021   Procedure: RENAL CRYO ABLATION;  Surgeon: Greggory Keen, MD;  Location: [facility];  Service: Anesthesiology;  Laterality: N/A;   right shoulder rotator cuff repair Right    ROBOT ASSISTED LAPAROSCOPIC NEPHRECTOMY Left 01/05/2020   Procedure: XI ROBOTIC ASSISTED LAPAROSCOPIC RADICAL NEPHRECTOMY;  Surgeon: Ceasar Mons, MD;  Location: [facility];  Service: Urology;  Laterality: Left;   TOTAL HIP ARTHROPLASTY Left 03/31/2020   Procedure: LEFT TOTAL HIP ARTHROPLASTY ANTERIOR APPROACH;  Surgeon: Mcarthur Rossetti, MD;  Location: [facility];  Service: Orthopedics;  Laterality: Left;   TRANSPHENOIDAL APPROACH EXPOSURE N/A 11/08/2021   Procedure: TRANSPHENOIDAL APPROACH EXPOSURE;  Surgeon: Jerrell Belfast, MD;  Location: [facility];  Service: ENT;  Laterality: N/A;    REVIEW OF SYSTEMS:   Review of Systems  Constitutional: Negative for appetite change, chills, fatigue, fever and unexpected weight change.  HENT: Negative for mouth sores, nosebleeds, sore throat and trouble swallowing.   Eyes: Negative for eye problems and icterus.  Respiratory: Negative for cough, hemoptysis, shortness of breath and wheezing.   Cardiovascular: Negative for chest pain and leg swelling.  Gastrointestinal: Negative for abdominal pain, constipation, diarrhea, nausea and vomiting.  Genitourinary: Negative for bladder incontinence, difficulty urinating, dysuria, frequency and hematuria.   Musculoskeletal: Negative for back pain, gait problem, neck pain and neck stiffness.  Skin: Negative for itching and rash.  Neurological: Negative for dizziness, extremity weakness, gait problem, headaches, light-headedness and seizures.  Hematological: Negative for adenopathy. Does not bruise/bleed easily.  Psychiatric/Behavioral: Negative for confusion, depression and sleep disturbance. The patient is not nervous/anxious.  PHYSICAL EXAMINATION:  Blood pressure 121/88, pulse 80, temperature 97.6 F (36.4 C), temperature source Oral, resp. rate 18, height '5\' 8"'$  (1.727 m), weight 249 lb 1.6 oz (113 kg), SpO2 97 %.  ECOG PERFORMANCE STATUS: 1  Physical Exam  Constitutional: Oriented to person, place, and time and well-developed, well-nourished, and in no distress.  HENT:  Head: Normocephalic and atraumatic.  Mouth/Throat: Oropharynx is clear and moist. No oropharyngeal exudate.  Eyes: Conjunctivae are normal. Right eye exhibits no discharge. Left eye exhibits no discharge. No scleral icterus.  Neck: Normal range of motion. Neck supple.  Cardiovascular: Normal rate, regular rhythm, normal heart sounds and intact distal pulses.   Pulmonary/Chest: Effort normal and breath sounds normal. No respiratory distress. No wheezes.  No rales.  Abdominal: Soft. Bowel sounds are normal. Exhibits no distension and no mass. There is no tenderness.  Musculoskeletal: Normal range of motion. Exhibits no edema.  Lymphadenopathy:    No cervical adenopathy.  Neurological: Alert and oriented to person, place, and time. Exhibits normal muscle tone. Gait normal. Coordination normal.  Skin: Skin is warm and dry. No rash noted. Not diaphoretic. No erythema. No pallor.  Psychiatric: Mood, memory and judgment normal.  Vitals reviewed.  LABORATORY DATA: Lab Results  Component Value Date   WBC 7.1 05/01/2022   HGB 14.1 05/01/2022   HCT 41.7 05/01/2022   MCV 86.5 05/01/2022   PLT 269 05/01/2022      Chemistry      Component Value Date/Time   NA 137 05/01/2022 0751   K 4.1 05/01/2022 0751   CL 105 05/01/2022 0751   CO2 24 05/01/2022 0751   BUN 20 05/01/2022 0751   CREATININE 1.19 05/01/2022 0751      Component Value Date/Time   CALCIUM 8.9 05/01/2022 0751   ALKPHOS 77 05/01/2022 0751   AST 29 05/01/2022 0751   ALT 33 05/01/2022 0751   BILITOT 0.5 05/01/2022 0751       RADIOGRAPHIC STUDIES:  MR Brain W Wo Contrast  Result Date: 04/27/2022 CLINICAL DATA:  Brain metastases. Pituitary protocol. Follow-up post radiation 3 months ago. EXAM: MRI HEAD WITHOUT AND WITH CONTRAST TECHNIQUE: Multiplanar, multiecho pulse sequences of the brain and surrounding structures were obtained without and with intravenous contrast. CONTRAST:  10 mL [employer]way COMPARISON:  Brain MRI 01/17/2022. FINDINGS: BRAIN New Lesions: None. Larger lesions: None. Stable or Smaller lesions: Stable postoperative changes from prior trans-sphenoidal debulking of the sellar mass. Interval decrease in size of the residual enhancing tissue along the right aspect of the sella and involving the pituitary infundibulum, now measuring up to 8.9 x 7.5 mm on sagittal image 6 series 23. Other Brain findings: No acute infarct or hemorrhage. No hydrocephalus or extra-axial  collection. Vascular: Enhancing atherosclerotic plaque along the right ICA cavernous segment without hemodynamically significant stenosis. Skull and upper cervical spine: Normal marrow signal. Sinuses/Orbits: Postoperative changes of trans-sphenoidal approach to the sella. Paranasal sinuses, mastoid air cells, and middle ear cavities are well aerated. Orbits are unremarkable. Other: None. IMPRESSION: 1. Interval decrease in size of the residual enhancing tissue along the right aspect of the sella and involving the pituitary infundibulum, consistent with response to radiation. 2. No new lesions. Electronically Signed   By: Emmit Alexanders M.D.   On: 04/27/2022 09:33   CT Chest W Contrast  Result Date: 04/05/2022 CLINICAL DATA:  Kidney cancer, recurrence. History of left renal carcinoma post nephrectomy and adrenalectomy. Right renal cell carcinoma post percutaneous cryoablation and immunotherapy. * Tracking Code: BO *  EXAM: CT CHEST WITH CONTRAST CT ABDOMEN AND PELVIS WITH AND WITHOUT CONTRAST TECHNIQUE: Multidetector CT imaging of the chest was performed during intravenous contrast administration. Multidetector CT imaging of the abdomen and pelvis was performed following the standard protocol before and during bolus administration of intravenous contrast. RADIATION DOSE REDUCTION: This exam was performed according to the departmental dose-optimization program which includes automated exposure control, adjustment of the mA and/or kV according to patient size and/or use of iterative reconstruction technique. CONTRAST:  136m OMNIPAQUE IOHEXOL 300 MG/ML  SOLN COMPARISON:  Prior CTs 01/09/2022 and 05/08/2021. PET-CT 05/25/2021. FINDINGS: CT CHEST FINDINGS Cardiovascular: No acute vascular findings. There is atherosclerosis of the aorta, great vessels and coronary arteries. The heart size is normal. There is no pericardial effusion. Mediastinum/Nodes: There are no enlarged mediastinal, hilar or axillary lymph nodes.  The thyroid gland, trachea and esophagus demonstrate no significant findings. Lungs/Pleura: No pleural effusion or pneumothorax. Right inferior medial pleural based mass measures 3.9 x 2.2 cm on image 78/506 (previously 3.5 x 1.9 cm). This measures up to 4.1 cm on coronal image 68/507 (previously 3.3 cm). The central attenuation of this lesion appears slightly lower, suggesting possible treatment related necrosis. Treatment related changes along the right major fissure and anterior aspect of the right lower lobe are again noted with associated bandlike scarring. A previously demonstrated small right lower lobe nodule has nearly resolved (faintly visible on image 117 of series 505). There is a new 5 mm subpleural nodule along the posterior aspect of the right hemidiaphragm on image 114/505. Other scattered tiny pulmonary nodules bilaterally are unchanged. No suspicious left lung nodules. Musculoskeletal/Chest wall: No chest wall mass or suspicious osseous findings. Right shoulder muscular atrophy noted. CT ABDOMEN AND PELVIS FINDINGS Hepatobiliary: The liver is normal in density without suspicious focal abnormality. Tiny gallstones are again noted. No evidence of gallbladder wall thickening or biliary dilatation. Pancreas: Stable 8 mm cystic lesion in the pancreatic neck without abnormal enhancement. No pancreatic ductal dilatation or surrounding inflammation. Spleen: Normal in size without focal abnormality. Adrenals/Urinary Tract: Status post left nephrectomy and adrenalectomy without recurrent mass lesion in the nephrectomy bed. The right adrenal gland appears normal. Cryo ablation defect posteriorly in the upper pole of the right kidney measures 4.1 x 2.2 cm on image 106/506 and demonstrates no abnormal enhancement. Adjacent 1.4 cm low-density lesion posteriorly in the interpolar region of the right kidney is unchanged and demonstrates no definite enhancement following contrast. No new or enlarging renal masses.  No evidence of urinary tract calculus or hydronephrosis. The bladder appears unremarkable for its degree of distention. Stomach/Bowel: No enteric contrast administered. The stomach appears unremarkable for its degree of distension. No evidence of bowel wall thickening, distention or surrounding inflammatory change. The appendix appears normal. There are sigmoid colon diverticular changes. Vascular/Lymphatic: There are no enlarged abdominal or pelvic lymph nodes. Mild aortic and branch vessel atherosclerosis without evidence of aneurysm or large vessel occlusion. Reproductive: The prostate gland is partially obscured by artifact from the left total hip arthroplasty. There are calcifications within and surrounding the prostate gland which appear unchanged. No mass lesion identified. Other: Intact abdominal wall.  No ascites or peritoneal nodularity. Musculoskeletal: Status post left total hip arthroplasty. Mild multilevel spondylosis. No evidence of osseous metastatic disease or acute osseous abnormality. IMPRESSION: 1. The dominant pleural-based mass in the inferior medial right hemithorax has mildly increased in size from the most recent prior study, suspicious for progressive metastatic disease. 2. New small subpleural nodule along the posterior  aspect of the right hemidiaphragm is nonspecific. Other scattered tiny pulmonary nodules are unchanged. 3. No other evidence of metastatic disease within the chest, abdomen or pelvis. 4. Stable appearance of ablation defect posteriorly in the upper pole of the right kidney. No evidence of local recurrence. An adjacent indeterminate low-density lesion in the posterior interpolar region of the right kidney is unchanged in size. Continued attention on follow-up recommended. 5. 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 Attention on follow-up recommended. 6. Cholelithiasis without evidence of cholecystitis or biliary  dilatation. 7.  Aortic Atherosclerosis (ICD10-I70.0). Electronically Signed   By: Richardean Sale M.D.   On: 04/05/2022 09:23   CT Abdomen Pelvis W Wo Contrast  Result Date: 04/05/2022 CLINICAL DATA:  Kidney cancer, recurrence. History of left renal carcinoma post nephrectomy and adrenalectomy. Right renal cell carcinoma post percutaneous cryoablation and immunotherapy. * Tracking Code: BO * EXAM: CT CHEST WITH CONTRAST CT ABDOMEN AND PELVIS WITH AND WITHOUT CONTRAST TECHNIQUE: Multidetector CT imaging of the chest was performed during intravenous contrast administration. Multidetector CT imaging of the abdomen and pelvis was performed following the standard protocol before and during bolus administration of intravenous contrast. RADIATION DOSE REDUCTION: This exam was performed according to the departmental dose-optimization program which includes automated exposure control, adjustment of the mA and/or kV according to patient size and/or use of iterative reconstruction technique. CONTRAST:  166m OMNIPAQUE IOHEXOL 300 MG/ML  SOLN COMPARISON:  Prior CTs 01/09/2022 and 05/08/2021. PET-CT 05/25/2021. FINDINGS: CT CHEST FINDINGS Cardiovascular: No acute vascular findings. There is atherosclerosis of the aorta, great vessels and coronary arteries. The heart size is normal. There is no pericardial effusion. Mediastinum/Nodes: There are no enlarged mediastinal, hilar or axillary lymph nodes. The thyroid gland, trachea and esophagus demonstrate no significant findings. Lungs/Pleura: No pleural effusion or pneumothorax. Right inferior medial pleural based mass measures 3.9 x 2.2 cm on image 78/506 (previously 3.5 x 1.9 cm). This measures up to 4.1 cm on coronal image 68/507 (previously 3.3 cm). The central attenuation of this lesion appears slightly lower, suggesting possible treatment related necrosis. Treatment related changes along the right major fissure and anterior aspect of the right lower lobe are again noted  with associated bandlike scarring. A previously demonstrated small right lower lobe nodule has nearly resolved (faintly visible on image 117 of series 505). There is a new 5 mm subpleural nodule along the posterior aspect of the right hemidiaphragm on image 114/505. Other scattered tiny pulmonary nodules bilaterally are unchanged. No suspicious left lung nodules. Musculoskeletal/Chest wall: No chest wall mass or suspicious osseous findings. Right shoulder muscular atrophy noted. CT ABDOMEN AND PELVIS FINDINGS Hepatobiliary: The liver is normal in density without suspicious focal abnormality. Tiny gallstones are again noted. No evidence of gallbladder wall thickening or biliary dilatation. Pancreas: Stable 8 mm cystic lesion in the pancreatic neck without abnormal enhancement. No pancreatic ductal dilatation or surrounding inflammation. Spleen: Normal in size without focal abnormality. Adrenals/Urinary Tract: Status post left nephrectomy and adrenalectomy without recurrent mass lesion in the nephrectomy bed. The right adrenal gland appears normal. Cryo ablation defect posteriorly in the upper pole of the right kidney measures 4.1 x 2.2 cm on image 106/506 and demonstrates no abnormal enhancement. Adjacent 1.4 cm low-density lesion posteriorly in the interpolar region of the right kidney is unchanged and demonstrates no definite enhancement following contrast. No new or enlarging renal masses. No evidence of urinary tract calculus or hydronephrosis. The bladder appears unremarkable for its  degree of distention. Stomach/Bowel: No enteric contrast administered. The stomach appears unremarkable for its degree of distension. No evidence of bowel wall thickening, distention or surrounding inflammatory change. The appendix appears normal. There are sigmoid colon diverticular changes. Vascular/Lymphatic: There are no enlarged abdominal or pelvic lymph nodes. Mild aortic and branch vessel atherosclerosis without evidence of  aneurysm or large vessel occlusion. Reproductive: The prostate gland is partially obscured by artifact from the left total hip arthroplasty. There are calcifications within and surrounding the prostate gland which appear unchanged. No mass lesion identified. Other: Intact abdominal wall.  No ascites or peritoneal nodularity. Musculoskeletal: Status post left total hip arthroplasty. Mild multilevel spondylosis. No evidence of osseous metastatic disease or acute osseous abnormality. IMPRESSION: 1. The dominant pleural-based mass in the inferior medial right hemithorax has mildly increased in size from the most recent prior study, suspicious for progressive metastatic disease. 2. New small subpleural nodule along the posterior aspect of the right hemidiaphragm is nonspecific. Other scattered tiny pulmonary nodules are unchanged. 3. No other evidence of metastatic disease within the chest, abdomen or pelvis. 4. Stable appearance of ablation defect posteriorly in the upper pole of the right kidney. No evidence of local recurrence. An adjacent indeterminate low-density lesion in the posterior interpolar region of the right kidney is unchanged in size. Continued attention on follow-up recommended. 5. 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 Attention on follow-up recommended. 6. Cholelithiasis without evidence of cholecystitis or biliary dilatation. 7.  Aortic Atherosclerosis (ICD10-I70.0). Electronically Signed   By: Richardean Sale M.D.   On: 04/05/2022 09:23     ASSESSMENT/PLAN:  This is a very pleasant 67 year old male diagnosed with renal cell carcinoma.  He was initially diagnosed and 2021.  He was found to have metastatic disease with pulmonary involvement and pituitary metastasis and 2023  The patient underwent robot-assisted laparoscopic left radical nephrectomy by Dr. Lovena Neighbours on 01/05/2020 that showed a 10.7 cm lesion with extension into the  perinephric tissue 0 of 4 lymph nodes involved.  The final pathology was T3a, N0, grade 2 tumor.  He then underwent radiation therapy to the pulmonary nodules which was completed in March 2023.  He then underwent cryoablation to 2 lesions in the right kidney which was performed on 08/23/2021  He then underwent SRS treatment to the pituitary bed which was completed in October 2023.  The patient is currently undergoing pembrolizumab 200 mg IV every 3 weeks which is started on 08/04/2021.  He is also on Axitnib 5 mg daily which was started in February 09, 2022.   The patient is status post 7 cycles of Keytruda.  The patient was seen with Dr. Julien Nordmann today.  Labs were reviewed.  Recommend that he proceed with the next cycle of treatment today as scheduled.  Will see him back for follow-up visit in 3 weeks for evaluation repeat blood work before undergoing cycle #9.  The patient is scheduled to follow-up with Ashlyn regarding his repeat brain MRI tomorrow.  The patient was advised to call immediately if she has any concerning symptoms in the interval. The patient voices understanding of current disease status and treatment options and is in agreement with the current care plan. All questions were answered. The patient knows to call the clinic with any problems, questions or concerns. We can certainly see the patient much sooner if necessary   Orders Placed This Encounter  Procedures   CBC with Differential ([facility] Only)  Standing Status:   Future    Standing Expiration Date:   05/23/2023   CMP ([facility] only)    Standing Status:   Future    Standing Expiration Date:   05/23/2023   T4    Standing Status:   Future    Standing Expiration Date:   05/23/2023   TSH    Standing Status:   Future    Standing Expiration Date:   05/23/2023   CBC with Differential ([facility] Only)    Standing Status:   Future    Standing Expiration Date:   06/13/2023   CMP ([facility] only)     Standing Status:   Future    Standing Expiration Date:   06/13/2023   T4    Standing Status:   Future    Standing Expiration Date:   06/13/2023   TSH    Standing Status:   Future    Standing Expiration Date:   06/13/2023   CBC with Differential ([facility] Only)    Standing Status:   Future    Standing Expiration Date:   07/04/2023   CMP ([facility] only)    Standing Status:   Future    Standing Expiration Date:   07/04/2023   T4    Standing Status:   Future    Standing Expiration Date:   07/04/2023   TSH    Standing Status:   Future    Standing Expiration Date:   07/04/2023   CBC with Differential ([facility] Only)    Standing Status:   Future    Standing Expiration Date:   07/25/2023   CMP ([facility] only)    Standing Status:   Future    Standing Expiration Date:   07/25/2023   T4    Standing Status:   Future    Standing Expiration Date:   07/25/2023   TSH    Standing Status:   Future    Standing Expiration Date:   07/25/2023      Marc Sos Gissell Barra, PA-C 05/01/22  ADDENDUM: Hematology/Oncology Attending: I had a face-to-face encounter with the patient today.  I reviewed his record, lab, and recommended his care plan.  This is a very pleasant 67 years old white male who came to the clinic today to establish care with me after his primary oncologist Dr. Alen Blew left the practice.  The patient has a stage IV clear-cell renal cell carcinoma that was initially diagnosed in 2021 as a stage III (T3a, N0, M0 status post laparoscopic left radical nephrectomy by Dr. Lovena Neighbours.  He was found to have evidence for disease metastasis to the lung in March 2023 and he underwent SBRT to pulmonary nodules.  He also underwent cryoablation to 2 new lesions in the right kidney on Aug 23, 2021.  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 Wilburn Cornelia on November 08, 2021 with the  final pathology confirming metastatic clear-cell renal cell carcinoma.  This was followed by [facility] to the pituitary bed in October 2023. The patient is currently undergoing systemic treatment with Keytruda 200 Mg IV every 3 weeks started on Aug 04, 2021 status post 7 cycles in addition to axitinib 5 mg p.o. daily that was added on February 09, 2022. He has been tolerating this treatment well with no concerning adverse effects. I recommended for the patient to proceed with cycle #8 today as planned.  His last MRI of the brain on April 22, 2022 showed decrease in the size of the residual enhancing tissue along the right aspect of the sella and involving the pituitary infundibulum consistent with response to radiation.  There was no new lesions. The patient will come back for follow-up visit in 3 weeks for evaluation before the next cycle of his treatment. He was advised to call immediately if he has any other concerning symptoms in the interval. The total time spent in the appointment was 30 minutes. Disclaimer: This note was dictated with voice recognition software. Similar sounding words can inadvertently be transcribed and may be missed upon review. Eilleen Kempf, MD

## 2022-04-30 ENCOUNTER — Inpatient Hospital Stay: Payer: 59

## 2022-04-30 ENCOUNTER — Other Ambulatory Visit: Payer: Self-pay | Admitting: Radiation Therapy

## 2022-04-30 DIAGNOSIS — C7931 Secondary malignant neoplasm of brain: Secondary | ICD-10-CM

## 2022-05-01 ENCOUNTER — Encounter: Payer: Self-pay | Admitting: Urology

## 2022-05-01 ENCOUNTER — Inpatient Hospital Stay (HOSPITAL_BASED_OUTPATIENT_CLINIC_OR_DEPARTMENT_OTHER): Payer: 59 | Admitting: Physician Assistant

## 2022-05-01 ENCOUNTER — Inpatient Hospital Stay: Payer: 59

## 2022-05-01 VITALS — BP 118/72 | HR 68 | Resp 17

## 2022-05-01 VITALS — BP 121/88 | HR 80 | Temp 97.6°F | Resp 18 | Ht 68.0 in | Wt 249.1 lb

## 2022-05-01 DIAGNOSIS — C642 Malignant neoplasm of left kidney, except renal pelvis: Secondary | ICD-10-CM

## 2022-05-01 DIAGNOSIS — Z5112 Encounter for antineoplastic immunotherapy: Secondary | ICD-10-CM

## 2022-05-01 LAB — CBC WITH DIFFERENTIAL (CANCER CENTER ONLY)
Abs Immature Granulocytes: 0 10*3/uL (ref 0.00–0.07)
Basophils Absolute: 0.1 10*3/uL (ref 0.0–0.1)
Basophils Relative: 1 %
Eosinophils Absolute: 0.4 10*3/uL (ref 0.0–0.5)
Eosinophils Relative: 5 %
HCT: 41.7 % (ref 39.0–52.0)
Hemoglobin: 14.1 g/dL (ref 13.0–17.0)
Immature Granulocytes: 0 %
Lymphocytes Relative: 40 %
Lymphs Abs: 2.9 10*3/uL (ref 0.7–4.0)
MCH: 29.3 pg (ref 26.0–34.0)
MCHC: 33.8 g/dL (ref 30.0–36.0)
MCV: 86.5 fL (ref 80.0–100.0)
Monocytes Absolute: 0.7 10*3/uL (ref 0.1–1.0)
Monocytes Relative: 10 %
Neutro Abs: 3.1 10*3/uL (ref 1.7–7.7)
Neutrophils Relative %: 44 %
Platelet Count: 269 10*3/uL (ref 150–400)
RBC: 4.82 MIL/uL (ref 4.22–5.81)
RDW: 15 % (ref 11.5–15.5)
WBC Count: 7.1 10*3/uL (ref 4.0–10.5)
nRBC: 0 % (ref 0.0–0.2)

## 2022-05-01 LAB — CMP (CANCER CENTER ONLY)
ALT: 33 U/L (ref 0–44)
AST: 29 U/L (ref 15–41)
Albumin: 4.2 g/dL (ref 3.5–5.0)
Alkaline Phosphatase: 77 U/L (ref 38–126)
Anion gap: 8 (ref 5–15)
BUN: 20 mg/dL (ref 8–23)
CO2: 24 mmol/L (ref 22–32)
Calcium: 8.9 mg/dL (ref 8.9–10.3)
Chloride: 105 mmol/L (ref 98–111)
Creatinine: 1.19 mg/dL (ref 0.61–1.24)
GFR, Estimated: >60 mL/min (ref >60)
Glucose, Bld: 88 mg/dL (ref 70–99)
Potassium: 4.1 mmol/L (ref 3.5–5.1)
Sodium: 137 mmol/L (ref 135–145)
Total Bilirubin: 0.5 mg/dL (ref 0.3–1.2)
Total Protein: 7.3 g/dL (ref 6.5–8.1)

## 2022-05-01 LAB — TSH: TSH: <0.01 u[IU]/mL — ABNORMAL LOW (ref 0.350–4.500)

## 2022-05-01 MED ORDER — SODIUM CHLORIDE 0.9 % IV SOLN
200.0000 mg | Freq: Once | INTRAVENOUS | Status: AC
  Administered 2022-05-01: 200 mg via INTRAVENOUS
  Filled 2022-05-01: qty 200

## 2022-05-01 MED ORDER — SODIUM CHLORIDE 0.9 % IV SOLN: Freq: Once | INTRAVENOUS | Status: AC

## 2022-05-01 NOTE — Progress Notes (Signed)
Telephone nursing appointment for patient to review most recent MRI scan from 04/26/22. I verified patient's identity and began nursing interview. Patient reports doing well. No issues reported at this time.   Meaningful use complete.   Patient aware of their 10:30am-05/02/22 telephone appointment w/ Ashlyn Bruning PA-C. I left my extension [phone] in case patient needs anything. Patient verbalized understanding. This concludes the nursing interview.   Patient contact [phone]     Leandra Kern, LPN

## 2022-05-01 NOTE — Patient Instructions (Signed)
[facility]  Discharge Instructions: Thank you for choosing [facility] to provide your oncology and hematology care.   If you have a lab appointment with the [facility], please go directly to the [facility] and check in at the registration area.   Wear comfortable clothing and clothing appropriate for easy access to any Portacath or PICC line.   We strive to give you quality time with your provider. You may need to reschedule your appointment if you arrive late (15 or more minutes).  Arriving late affects you and other patients whose appointments are after yours.  Also, if you miss three or more appointments without notifying the office, you may be dismissed from the clinic at the provider's discretion.      For prescription refill requests, have your pharmacy contact our office and allow 72 hours for refills to be completed.    Today you received the following chemotherapy and/or immunotherapy agents: Keytruda      To help prevent nausea and vomiting after your treatment, we encourage you to take your nausea medication as directed.  BELOW ARE SYMPTOMS THAT SHOULD BE REPORTED IMMEDIATELY: *FEVER GREATER THAN 100.4 F (38 C) OR HIGHER *CHILLS OR SWEATING *NAUSEA AND VOMITING THAT IS NOT CONTROLLED WITH YOUR NAUSEA MEDICATION *UNUSUAL SHORTNESS OF BREATH *UNUSUAL BRUISING OR BLEEDING *URINARY PROBLEMS (pain or burning when urinating, or frequent urination) *BOWEL PROBLEMS (unusual diarrhea, constipation, pain near the anus) TENDERNESS IN MOUTH AND THROAT WITH OR WITHOUT PRESENCE OF ULCERS (sore throat, sores in mouth, or a toothache) UNUSUAL RASH, SWELLING OR PAIN  UNUSUAL VAGINAL DISCHARGE OR ITCHING   Items with * indicate a potential emergency and should be followed up as soon as possible or go to the Emergency Department if any problems should occur.  Please show the CHEMOTHERAPY ALERT CARD or IMMUNOTHERAPY ALERT CARD at  check-in to the Emergency Department and triage nurse.  Should you have questions after your visit or need to cancel or reschedule your appointment, please contact [facility]  Dept: [phone]  and follow the prompts.  Office hours are 8:00 a.m. to 4:30 p.m. Monday - Friday. Please note that voicemails left after 4:00 p.m. may not be returned until the following business day.  We are closed weekends and major holidays. You have access to a nurse at all times for urgent questions. Please call the main number to the clinic Dept: [phone] and follow the prompts.   For any non-urgent questions, you may also contact your provider using MyChart. We now offer e-Visits for anyone 14 and older to request care online for non-urgent symptoms. For details visit [email or web address].   Also download the MyChart app! Go to the app store, search "MyChart", open the app, select [facility], and log in with your MyChart username and password.

## 2022-05-02 ENCOUNTER — Encounter: Payer: Self-pay | Admitting: Oncology

## 2022-05-02 ENCOUNTER — Ambulatory Visit
Admission: RE | Admit: 2022-05-02 | Discharge: 2022-05-02 | Disposition: A | Discharge status: Home / Self Care | Payer: 59 | Source: Ambulatory Visit | Attending: Urology | Admitting: Urology

## 2022-05-02 DIAGNOSIS — C7931 Secondary malignant neoplasm of brain: Secondary | ICD-10-CM

## 2022-05-02 NOTE — Progress Notes (Signed)
Radiation Oncology         [phone]) [phone] ________________________________  Name: Marc [name] MRN: [id number]  Date: 05/02/2022  DOB: 30-Sep-1955  Post Treatment Note  CC: [name], PA  [name], MD  Diagnosis:    67 yo male with metastatic renal cell carcinoma to the brain/pituitary    Interval Since Last Radiation:  3 months  01/19/22 - 01/29/22: fractionated post-op SRS   06/29/21 - 07/05/21:  The targets in the right upper and lower lobe lung were treated to 50 Gy in 10 fractions of 5 Gy   Narrative:  I spoke with the patient to conduct his routine scheduled 3 month follow up visit to review results from his recent post-treatment MRI brain scan via telephone to spare the patient unnecessary potential exposure in the healthcare setting during the current COVID-19 pandemic.  The patient was notified in advance and gave permission to proceed with this visit format.  He tolerated radiation treatment relatively well without any ill side effects.  He had a recent posttreatment MRI brain scan on 04/26/2022 and we reviewed those results by telephone today.                              On review of systems, the patient states that he is doing well in general and remains without complaints.  He specifically denies headaches, nausea, vomiting, changes in visual or auditory acuity, dizziness, tremor or seizure activity.  He has a good energy level and has remained active.  He reports a healthy appetite and is maintaining his weight.  Overall, he is pleased with his progress to date.  He had recent restaging CT C/A/P on 04/03/2022 which did not show any definite evidence of disease progression.  There is what appears to be treatment effect within the treated right inferior medial pleural-based mass as well as along the right major fissure and anterior aspect of the right lower lobe and no concerning lymphadenopathy in the chest, abdomen or pelvis.  He had a recent follow-up visit with Dr.  Earlie Server on 05/01/2022 and the current plan is to continue systemic therapy with [facility] every 3 weeks in addition to daily oral axitinib.  ALLERGIES:  has No Known Allergies.  Meds: Current Outpatient Medications  Medication Sig Dispense Refill   Ascorbic Acid (VITAMIN C PO) Take 1 tablet by mouth every evening.     aspirin EC 81 MG tablet Take 81 mg by mouth every evening. Swallow whole.     atorvastatin (LIPITOR) 10 MG tablet Take 10 mg by mouth every evening.     axitinib (INLYTA) 5 MG tablet Take 1 tablet (5 mg total) by mouth daily. 30 tablet 3   cholecalciferol (VITAMIN D3) 25 MCG (1000 UNIT) tablet Take 1,000 Units by mouth every evening.     desmopressin (DDAVP) 0.1 MG tablet Take 0.5 tablets (0.05 mg total) by mouth 2 (two) times daily. 60 tablet 2   Ferrous Sulfate (IRON SLOW RELEASE PO) Take 1 tablet by mouth every evening.     HYDROcodone-acetaminophen (NORCO) 5-325 MG tablet Take 1-2 tablets by mouth every 6 (six) hours as needed for moderate pain. (Patient not taking: Reported on 12/22/2021) 20 tablet 0   HYDROcodone-acetaminophen (NORCO/VICODIN) 5-325 MG tablet Take 1 tablet by mouth every 4 (four) hours as needed for moderate pain. (Patient not taking: Reported on 12/22/2021) 30 tablet 0   hydrocortisone (CORTEF) 10 MG tablet TAKE 1.5 TABLETS  BY MOUTH EVERY MORNING AND 1 TABLET AT 4PM FOR PAIN 75 tablet 2   ibuprofen (ADVIL) 200 MG tablet Take 400-600 mg by mouth every 6 (six) hours as needed for moderate pain.     levothyroxine (SYNTHROID) 88 MCG tablet Take 1 tablet (88 mcg total) by mouth daily. 90 tablet 3   Multiple Vitamin (MULTI VITAMIN) TABS 1 tablet Orally Once a day for 30 day(s)     Multiple Vitamins-Minerals (MULTIVITAMIN WITH MINERALS) tablet Take 1 tablet by mouth every evening.     Omega-3 Fatty Acids (FISH OIL PO) Take 1 capsule by mouth every evening.     prochlorperazine (COMPAZINE) 10 MG tablet Take 1 tablet (10 mg total) by mouth every 6 (six) hours as needed  for nausea or vomiting. 30 tablet 0   Testosterone (ANDROGEL) 20.25 MG/1.25GM (1.62%) GEL Place 1 Pump onto the skin as directed. 1 pump to each shoulder daily 75 g 5   vitamin E 180 MG (400 UNITS) capsule Take 400 Units by mouth every evening.     No current facility-administered medications for this encounter.    Physical Findings:  vitals were not taken for this visit.  Pain Assessment Pain Score: 0-No pain/10 Unable to assess due to telephone follow up visit format.  Lab Findings: Lab Results  Component Value Date   WBC 7.1 05/01/2022   HGB 14.1 05/01/2022   HCT 41.7 05/01/2022   MCV 86.5 05/01/2022   PLT 269 05/01/2022     Radiographic Findings: MR Brain W Wo Contrast  Result Date: 04/27/2022 CLINICAL DATA:  Brain metastases. Pituitary protocol. Follow-up post radiation 3 months ago. EXAM: MRI HEAD WITHOUT AND WITH CONTRAST TECHNIQUE: Multiplanar, multiecho pulse sequences of the brain and surrounding structures were obtained without and with intravenous contrast. CONTRAST:  10 mL Vueway COMPARISON:  Brain MRI 01/17/2022. FINDINGS: BRAIN New Lesions: None. Larger lesions: None. Stable or Smaller lesions: Stable postoperative changes from prior trans-sphenoidal debulking of the sellar mass. Interval decrease in size of the residual enhancing tissue along the right aspect of the sella and involving the pituitary infundibulum, now measuring up to 8.9 x 7.5 mm on sagittal image 6 series 23. Other Brain findings: No acute infarct or hemorrhage. No hydrocephalus or extra-axial collection. Vascular: Enhancing atherosclerotic plaque along the right ICA cavernous segment without hemodynamically significant stenosis. Skull and upper cervical spine: Normal marrow signal. Sinuses/Orbits: Postoperative changes of trans-sphenoidal approach to the sella. Paranasal sinuses, mastoid air cells, and middle ear cavities are well aerated. Orbits are unremarkable. Other: None. IMPRESSION: 1. Interval  decrease in size of the residual enhancing tissue along the right aspect of the sella and involving the pituitary infundibulum, consistent with response to radiation. 2. No new lesions. Electronically Signed   By: Emmit Alexanders M.D.   On: 04/27/2022 09:33   CT Chest W Contrast  Result Date: 04/05/2022 CLINICAL DATA:  Kidney cancer, recurrence. History of left renal carcinoma post nephrectomy and adrenalectomy. Right renal cell carcinoma post percutaneous cryoablation and immunotherapy. * Tracking Code: BO * EXAM: CT CHEST WITH CONTRAST CT ABDOMEN AND PELVIS WITH AND WITHOUT CONTRAST TECHNIQUE: Multidetector CT imaging of the chest was performed during intravenous contrast administration. Multidetector CT imaging of the abdomen and pelvis was performed following the standard protocol before and during bolus administration of intravenous contrast. RADIATION DOSE REDUCTION: This exam was performed according to the departmental dose-optimization program which includes automated exposure control, adjustment of the mA and/or kV according to patient size and/or use of  iterative reconstruction technique. CONTRAST:  169m OMNIPAQUE IOHEXOL 300 MG/ML  SOLN COMPARISON:  Prior CTs 01/09/2022 and 05/08/2021. PET-CT 05/25/2021. FINDINGS: CT CHEST FINDINGS Cardiovascular: No acute vascular findings. There is atherosclerosis of the aorta, great vessels and coronary arteries. The heart size is normal. There is no pericardial effusion. Mediastinum/Nodes: There are no enlarged mediastinal, hilar or axillary lymph nodes. The thyroid gland, trachea and esophagus demonstrate no significant findings. Lungs/Pleura: No pleural effusion or pneumothorax. Right inferior medial pleural based mass measures 3.9 x 2.2 cm on image 78/506 (previously 3.5 x 1.9 cm). This measures up to 4.1 cm on coronal image 68/507 (previously 3.3 cm). The central attenuation of this lesion appears slightly lower, suggesting possible treatment related necrosis.  Treatment related changes along the right major fissure and anterior aspect of the right lower lobe are again noted with associated bandlike scarring. A previously demonstrated small right lower lobe nodule has nearly resolved (faintly visible on image 117 of series 505). There is a new 5 mm subpleural nodule along the posterior aspect of the right hemidiaphragm on image 114/505. Other scattered tiny pulmonary nodules bilaterally are unchanged. No suspicious left lung nodules. Musculoskeletal/Chest wall: No chest wall mass or suspicious osseous findings. Right shoulder muscular atrophy noted. CT ABDOMEN AND PELVIS FINDINGS Hepatobiliary: The liver is normal in density without suspicious focal abnormality. Tiny gallstones are again noted. No evidence of gallbladder wall thickening or biliary dilatation. Pancreas: Stable 8 mm cystic lesion in the pancreatic neck without abnormal enhancement. No pancreatic ductal dilatation or surrounding inflammation. Spleen: Normal in size without focal abnormality. Adrenals/Urinary Tract: Status post left nephrectomy and adrenalectomy without recurrent mass lesion in the nephrectomy bed. The right adrenal gland appears normal. Cryo ablation defect posteriorly in the upper pole of the right kidney measures 4.1 x 2.2 cm on image 106/506 and demonstrates no abnormal enhancement. Adjacent 1.4 cm low-density lesion posteriorly in the interpolar region of the right kidney is unchanged and demonstrates no definite enhancement following contrast. No new or enlarging renal masses. No evidence of urinary tract calculus or hydronephrosis. The bladder appears unremarkable for its degree of distention. Stomach/Bowel: No enteric contrast administered. The stomach appears unremarkable for its degree of distension. No evidence of bowel wall thickening, distention or surrounding inflammatory change. The appendix appears normal. There are sigmoid colon diverticular changes. Vascular/Lymphatic: There  are no enlarged abdominal or pelvic lymph nodes. Mild aortic and branch vessel atherosclerosis without evidence of aneurysm or large vessel occlusion. Reproductive: The prostate gland is partially obscured by artifact from the left total hip arthroplasty. There are calcifications within and surrounding the prostate gland which appear unchanged. No mass lesion identified. Other: Intact abdominal wall.  No ascites or peritoneal nodularity. Musculoskeletal: Status post left total hip arthroplasty. Mild multilevel spondylosis. No evidence of osseous metastatic disease or acute osseous abnormality. IMPRESSION: 1. The dominant pleural-based mass in the inferior medial right hemithorax has mildly increased in size from the most recent prior study, suspicious for progressive metastatic disease. 2. New small subpleural nodule along the posterior aspect of the right hemidiaphragm is nonspecific. Other scattered tiny pulmonary nodules are unchanged. 3. No other evidence of metastatic disease within the chest, abdomen or pelvis. 4. Stable appearance of ablation defect posteriorly in the upper pole of the right kidney. No evidence of local recurrence. An adjacent indeterminate low-density lesion in the posterior interpolar region of the right kidney is unchanged in size. Continued attention on follow-up recommended. 5. 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 Attention on follow-up recommended. 6. Cholelithiasis without evidence of cholecystitis or biliary dilatation. 7.  Aortic Atherosclerosis (ICD10-I70.0). Electronically Signed   By: Richardean Sale M.D.   On: 04/05/2022 09:23   CT Abdomen Pelvis W Wo Contrast  Result Date: 04/05/2022 CLINICAL DATA:  Kidney cancer, recurrence. History of left renal carcinoma post nephrectomy and adrenalectomy. Right renal cell carcinoma post percutaneous cryoablation and immunotherapy. * Tracking Code: BO * EXAM: CT CHEST WITH  CONTRAST CT ABDOMEN AND PELVIS WITH AND WITHOUT CONTRAST TECHNIQUE: Multidetector CT imaging of the chest was performed during intravenous contrast administration. Multidetector CT imaging of the abdomen and pelvis was performed following the standard protocol before and during bolus administration of intravenous contrast. RADIATION DOSE REDUCTION: This exam was performed according to the departmental dose-optimization program which includes automated exposure control, adjustment of the mA and/or kV according to patient size and/or use of iterative reconstruction technique. CONTRAST:  165m OMNIPAQUE IOHEXOL 300 MG/ML  SOLN COMPARISON:  Prior CTs 01/09/2022 and 05/08/2021. PET-CT 05/25/2021. FINDINGS: CT CHEST FINDINGS Cardiovascular: No acute vascular findings. There is atherosclerosis of the aorta, great vessels and coronary arteries. The heart size is normal. There is no pericardial effusion. Mediastinum/Nodes: There are no enlarged mediastinal, hilar or axillary lymph nodes. The thyroid gland, trachea and esophagus demonstrate no significant findings. Lungs/Pleura: No pleural effusion or pneumothorax. Right inferior medial pleural based mass measures 3.9 x 2.2 cm on image 78/506 (previously 3.5 x 1.9 cm). This measures up to 4.1 cm on coronal image 68/507 (previously 3.3 cm). The central attenuation of this lesion appears slightly lower, suggesting possible treatment related necrosis. Treatment related changes along the right major fissure and anterior aspect of the right lower lobe are again noted with associated bandlike scarring. A previously demonstrated small right lower lobe nodule has nearly resolved (faintly visible on image 117 of series 505). There is a new 5 mm subpleural nodule along the posterior aspect of the right hemidiaphragm on image 114/505. Other scattered tiny pulmonary nodules bilaterally are unchanged. No suspicious left lung nodules. Musculoskeletal/Chest wall: No chest wall mass or  suspicious osseous findings. Right shoulder muscular atrophy noted. CT ABDOMEN AND PELVIS FINDINGS Hepatobiliary: The liver is normal in density without suspicious focal abnormality. Tiny gallstones are again noted. No evidence of gallbladder wall thickening or biliary dilatation. Pancreas: Stable 8 mm cystic lesion in the pancreatic neck without abnormal enhancement. No pancreatic ductal dilatation or surrounding inflammation. Spleen: Normal in size without focal abnormality. Adrenals/Urinary Tract: Status post left nephrectomy and adrenalectomy without recurrent mass lesion in the nephrectomy bed. The right adrenal gland appears normal. Cryo ablation defect posteriorly in the upper pole of the right kidney measures 4.1 x 2.2 cm on image 106/506 and demonstrates no abnormal enhancement. Adjacent 1.4 cm low-density lesion posteriorly in the interpolar region of the right kidney is unchanged and demonstrates no definite enhancement following contrast. No new or enlarging renal masses. No evidence of urinary tract calculus or hydronephrosis. The bladder appears unremarkable for its degree of distention. Stomach/Bowel: No enteric contrast administered. The stomach appears unremarkable for its degree of distension. No evidence of bowel wall thickening, distention or surrounding inflammatory change. The appendix appears normal. There are sigmoid colon diverticular changes. Vascular/Lymphatic: There are no enlarged abdominal or pelvic lymph nodes. Mild aortic and branch vessel atherosclerosis without evidence of aneurysm or large vessel occlusion. Reproductive: The prostate gland is partially obscured by artifact from the left total hip arthroplasty. There are  calcifications within and surrounding the prostate gland which appear unchanged. No mass lesion identified. Other: Intact abdominal wall.  No ascites or peritoneal nodularity. Musculoskeletal: Status post left total hip arthroplasty. Mild multilevel spondylosis. No  evidence of osseous metastatic disease or acute osseous abnormality. IMPRESSION: 1. The dominant pleural-based mass in the inferior medial right hemithorax has mildly increased in size from the most recent prior study, suspicious for progressive metastatic disease. 2. New small subpleural nodule along the posterior aspect of the right hemidiaphragm is nonspecific. Other scattered tiny pulmonary nodules are unchanged. 3. No other evidence of metastatic disease within the chest, abdomen or pelvis. 4. Stable appearance of ablation defect posteriorly in the upper pole of the right kidney. No evidence of local recurrence. An adjacent indeterminate low-density lesion in the posterior interpolar region of the right kidney is unchanged in size. Continued attention on follow-up recommended. 5. 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 Attention on follow-up recommended. 6. Cholelithiasis without evidence of cholecystitis or biliary dilatation. 7.  Aortic Atherosclerosis (ICD10-I70.0). Electronically Signed   By: Richardean Sale M.D.   On: 04/05/2022 09:23    Impression/Plan: 39.  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 He has recovered well from the effects of his recent fractionated postoperative SRS treatment and remains without complaints.  His recent posttreatment MRI brain scan from 04/26/2022 shows an excellent treatment response with decreased size of the residual tissue and no new lesions.  Therefore, we discussed the plan to continue with serial MRI brain scans every 3 months to continue to monitor for any evidence of disease recurrence or progression.  I will plan to follow-up with him by telephone following each scan to review results and any recommendations from the multidisciplinary brain conference.  He will also continue in routine follow-up with Dr. Earlie Server for continued management of his systemic disease.  He  knows that he is welcome to call at anytime in the interim with any questions or concerns regarding his previous radiation treatments.   I personally spent 20 minutes in this encounter including chart review, reviewing radiological studies, telephone conversation with the patient, entering orders and completing documentation.    Nicholos Johns, PA-C

## 2022-05-02 NOTE — Progress Notes (Signed)
  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  Plan: The patient has completed radiation treatment. The patient will return to radiation oncology clinic for routine followup in one month. I advised them to call or return sooner if they have any questions or concerns related to their recovery or treatment. ________________________________  Sheral Apley. Tammi Klippel, M.D.

## 2022-05-03 ENCOUNTER — Other Ambulatory Visit: Payer: Self-pay | Admitting: Radiation Therapy

## 2022-05-03 LAB — T4: T4, Total: 6.6 ug/dL (ref 4.5–12.0)

## 2022-05-04 ENCOUNTER — Other Ambulatory Visit: Payer: Self-pay

## 2022-05-07 ENCOUNTER — Other Ambulatory Visit: Payer: Self-pay | Admitting: Internal Medicine

## 2022-05-09 ENCOUNTER — Telehealth: Payer: Self-pay | Admitting: Internal Medicine

## 2022-05-09 NOTE — Telephone Encounter (Signed)
Called patient regarding upcoming February-April appointments. Patient is notified. 

## 2022-05-13 ENCOUNTER — Encounter: Payer: Self-pay | Admitting: Cardiovascular Disease

## 2022-05-13 NOTE — Progress Notes (Signed)
Cardiology Office Note:    Date:  05/17/2022   ID:  Marc [name], DOB 27-Dec-1955, MRN [id number]  PCP:  [name], [place] Providers Cardiologist:  Howard Bunte     Referring MD: [name], [place]   Chief Complaint  Patient presents with   artic atherosclerosis    History of Present Illness: Feb. 14, 2024   Marc [name] is a 67 y.o. male with a hx of HTN, HLD , renal cell cancer  - is 1/2 way through with his immunotheraphy  He was recently found to have aortic atherosclerosis   10 years ago, his younger brother died of an MI   He went to a Dr. Einar Gip , he was found to have coronary artery calcifications but no obstructive coronary artery disease.  I suspect that he may have had a Myoview study.  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)   Former smoker ,  quit 1 years ago   Had a Charity fundraiser tumor , lost 50 lbs, Then was diagnosed with tumor ,  starte Cortef,  regained the 50 lbs     Past Medical History:  Diagnosis Date   Arthritis    Cancer ([place])    Kidney, Lung, Pituitary   Hyperlipidemia    Hypothyroidism     Past Surgical History:  Procedure Laterality Date   CRANIOTOMY N/A 11/08/2021   Procedure: ENDOSCOPIC ENDONASAL RESECTION OF SELLAR MASS;  Surgeon: Judith Part, MD;  Location: [facility];  Service: Neurosurgery;  Laterality: N/A;  RM 20   IR RADIOLOGIST EVAL & MGMT  07/14/2021   IR RADIOLOGIST EVAL & MGMT  07/27/2021   IR RADIOLOGIST EVAL & MGMT  01/30/2022   RADIOFREQUENCY ABLATION N/A 08/23/2021   Procedure: RENAL CRYO ABLATION;  Surgeon: Greggory Keen, MD;  Location: [facility];  Service: Anesthesiology;  Laterality: N/A;   right shoulder rotator cuff repair Right    ROBOT ASSISTED LAPAROSCOPIC NEPHRECTOMY Left 01/05/2020   Procedure: XI ROBOTIC ASSISTED LAPAROSCOPIC RADICAL NEPHRECTOMY;  Surgeon: Ceasar Mons, MD;  Location: [facility];  Service: Urology;  Laterality: Left;   TOTAL HIP ARTHROPLASTY Left 03/31/2020   Procedure: LEFT TOTAL HIP ARTHROPLASTY ANTERIOR APPROACH;  Surgeon: Mcarthur Rossetti, MD;  Location: [facility];  Service: Orthopedics;  Laterality: Left;   TRANSPHENOIDAL APPROACH EXPOSURE N/A 11/08/2021   Procedure: TRANSPHENOIDAL APPROACH EXPOSURE;  Surgeon: Jerrell Belfast, MD;  Location: [facility];  Service: ENT;  Laterality: N/A;    Current Medications: Current Meds  Medication Sig   Ascorbic Acid (VITAMIN C PO) Take 1 tablet by mouth every evening.   aspirin EC 81 MG tablet Take 81 mg by mouth every evening. Swallow whole.   axitinib (INLYTA) 5 MG tablet Take 1 tablet (5 mg total) by mouth daily.   cholecalciferol (VITAMIN D3) 25 MCG (1000 UNIT) tablet Take 1,000 Units by mouth every evening.   Ferrous Sulfate (IRON SLOW RELEASE PO) Take 1 tablet by mouth every evening.   hydrocortisone (CORTEF) 10 MG tablet TAKE 1.5 TABLETS BY MOUTH EVERY MORNING AND 1 TABLET AT 4PM FOR PAIN   ibuprofen (ADVIL) 200 MG tablet Take 400-600 mg by mouth every 6 (six) hours as needed for moderate pain.   levothyroxine (SYNTHROID) 88 MCG tablet Take 1 tablet (88 mcg total) by mouth daily.   Multiple Vitamin (MULTI  VITAMIN) TABS 1 tablet Orally Once a day for 30 day(s)   Omega-3 Fatty Acids (FISH OIL PO) Take 1 capsule by mouth every evening.   prochlorperazine (COMPAZINE) 10 MG tablet Take 1 tablet (10 mg total) by mouth every 6 (six) hours as needed for nausea or vomiting.   rosuvastatin (CRESTOR) 20 MG tablet Take 1 tablet (20 mg total) by mouth daily.   Testosterone (ANDROGEL) 20.25 MG/1.25GM (1.62%) GEL Place 1 Pump onto the skin as directed. 1 pump to each shoulder daily   vitamin E 180 MG (400 UNITS) capsule Take 400 Units by mouth every evening.   [DISCONTINUED] atorvastatin (LIPITOR) 10 MG tablet Take 10 mg by mouth every evening.     Allergies:   Patient has no known allergies.    Social History   Socioeconomic History   Marital status: Single    Spouse name: Not on file   Number of children: 2   Years of education: Not on file   Highest education level: Not on file  Occupational History   Not on file  Tobacco Use   Smoking status: Former    Years: 30.00    Types: Cigarettes    Quit date: 04/03/2015    Years since quitting: 7.1   Smokeless tobacco: Never  Vaping Use   Vaping Use: Former   Quit date: 04/16/2021   Substances: Nicotine, Flavoring  Substance and Sexual Activity   Alcohol use: Yes    Comment: occas    Drug use: No   Sexual activity: Not on file  Other Topics Concern   Not on file  Social History Narrative   Not on file   Social Determinants of Health   Financial Resource Strain: Not on file  Food Insecurity: No Food Insecurity (05/01/2022)   Hunger Vital Sign    Worried About Running Out of Food in the Last Year: Never true    Ran Out of Food in the Last Year: Never true  Transportation Needs: No Transportation Needs (05/01/2022)   PRAPARE - Hydrologist (Medical): No    Lack of Transportation (Non-Medical): No  Physical Activity: Not on file  Stress: Not on file  Social Connections: Not on file     Family History: The patient's family history is not on file.  ROS:   Please see the history of present illness.     All other systems reviewed and are negative.  EKGs/Labs/Other Studies Reviewed:    The following studies were reviewed today:   EKG: May 16, 2022: Normal sinus rhythm at 84.  Voltage for LVH.  Recent Labs: 05/01/2022: ALT 33; BUN 20; Creatinine 1.19; Hemoglobin 14.1; Platelet Count 269; Potassium 4.1; Sodium 137; TSH <0.010  Recent Lipid Panel No results found for: "CHOL", "TRIG", "HDL", "CHOLHDL", "VLDL", "LDLCALC", "LDLDIRECT"   Risk Assessment/Calculations:                Physical Exam:    VS:  BP 130/74   Pulse 82   Ht 5' 9"$  (1.753 m)   Wt 245 lb 9.6 oz (111.4  kg)   SpO2 96%   BMI 36.27 kg/m     Wt Readings from Last 3 Encounters:  05/16/22 245 lb 9.6 oz (111.4 kg)  05/01/22 249 lb 1.6 oz (113 kg)  04/10/22 250 lb 11.2 oz (113.7 kg)     GEN:  Well nourished, well developed in no acute distress HEENT: Normal NECK: No JVD; No carotid bruits LYMPHATICS: No lymphadenopathy  CARDIAC: RRR, no murmurs, rubs, gallops RESPIRATORY:  Clear to auscultation without rales, wheezing or rhonchi  ABDOMEN: Soft, non-tender, non-distended MUSCULOSKELETAL:  No edema; No deformity  SKIN: Warm and dry NEUROLOGIC:  Alert and oriented x 3 PSYCHIATRIC:  Normal affect   ASSESSMENT:    1. Coronary artery calcification of native artery   2. Mixed hyperlipidemia    PLAN:      Coronary artery calcifications: Richardson Landry is seen today for follow-up of coronary artery calcifications.  He has hyperlipidemia.  His LDL is 106.  His triglyceride level is 300.  He has a family history of coronary artery disease.  I would like to get a lipoprotein a.  Will stop the atorvastatin and put him on rosuvastatin 20 mg a day.  I encouraged him to work on some weight loss.  Work advised him to work on calorie reduction and portion reduction.   I will see him in 3 months for follow-up visit.  Will check lipids, ALT, basic metabolic profile in 3 months           Medication Adjustments/Labs and Tests Ordered: Current medicines are reviewed at length with the patient today.  Concerns regarding medicines are outlined above.  Orders Placed This Encounter  Procedures   Lipoprotein A (LPA)   Lipid panel   ALT   Basic metabolic panel   EKG [id number]   Meds ordered this encounter  Medications   rosuvastatin (CRESTOR) 20 MG tablet    Sig: Take 1 tablet (20 mg total) by mouth daily.    Dispense:  90 tablet    Refill:  3    Patient Instructions  Medication Instructions:  STOP Atorvastatin  START Rosuvastatin 49m daily *If you need a refill on your cardiac medications  before your next appointment, please call your pharmacy*   Lab Work: Lipoprotein (a) today Lipids, ALT, BMET in 3 months (one week prior to appt) If you have labs (blood work) drawn today and your tests are completely normal, you will receive your results only by: [place](if you have MyChart) OR A paper copy in the mail If you have any lab test that is abnormal or we need to change your treatment, we will call you to review the results.   Testing/Procedures: NONE   Follow-Up: [facility].  As part of our continuing mission to provide you with exceptional heart care, we have created designated Provider Care Teams.  These Care Teams include your primary Cardiologist (physician) and Advanced Practice Providers (APPs -  Physician Assistants and Nurse Practitioners) who all work together to provide you with the care you need, when you need it.  We recommend signing up for the patient portal called "MyChart".  Sign up information is provided on this After Visit Summary.  MyChart is used to connect with patients for Virtual Visits (Telemedicine).  Patients are able to view lab/test results, encounter notes, upcoming appointments, etc.  Non-urgent messages can be sent to your provider as well.   To learn more about what you can do with MyChart, go to [email or web address]    Your next appointment:   3 month(s)  Provider:   PMertie Moores MD     Signed, PMertie Moores MD  05/17/2022 9:12 AM    [place]

## 2022-05-16 ENCOUNTER — Ambulatory Visit: Payer: 59 | Attending: Cardiovascular Disease | Admitting: Cardiovascular Disease

## 2022-05-16 ENCOUNTER — Encounter: Payer: Self-pay | Admitting: Cardiovascular Disease

## 2022-05-16 VITALS — BP 130/74 | HR 82 | Ht 69.0 in | Wt 245.6 lb

## 2022-05-16 DIAGNOSIS — I2510 Atherosclerotic heart disease of native coronary artery without angina pectoris: Secondary | ICD-10-CM | POA: Diagnosis not present

## 2022-05-16 DIAGNOSIS — E782 Mixed hyperlipidemia: Secondary | ICD-10-CM | POA: Diagnosis not present

## 2022-05-16 DIAGNOSIS — I2584 Coronary atherosclerosis due to calcified coronary lesion: Secondary | ICD-10-CM | POA: Diagnosis not present

## 2022-05-16 MED ORDER — ROSUVASTATIN CALCIUM 20 MG PO TABS: 20.0000 mg | ORAL_TABLET | Freq: Every day | ORAL | 3 refills | Status: DC

## 2022-05-16 NOTE — Patient Instructions (Signed)
Medication Instructions:  STOP Atorvastatin  START Rosuvastatin 33m daily *If you need a refill on your cardiac medications before your next appointment, please call your pharmacy*   Lab Work: Lipoprotein (a) today Lipids, ALT, BMET in 3 months (one week prior to appt) If you have labs (blood work) drawn today and your tests are completely normal, you will receive your results only by: [place](if you have MyChart) OR A paper copy in the mail If you have any lab test that is abnormal or we need to change your treatment, we will call you to review the results.   Testing/Procedures: NONE   Follow-Up: [facility] and [facility] our [facility].  As part of our continuing mission to provide you with exceptional heart care, we have created designated Provider Care Teams.  These Care Teams include your primary Cardiologist (physician) and Advanced Practice Providers (APPs -  Physician Assistants and Nurse Practitioners) who all work together to provide you with the care you need, when you need it.  We recommend signing up for the patient portal called "MyChart".  Sign up information is provided on this After Visit Summary.  MyChart is used to connect with patients for Virtual Visits (Telemedicine).  Patients are able to view lab/test results, encounter notes, upcoming appointments, etc.  Non-urgent messages can be sent to your provider as well.   To learn more about what you can do with MyChart, go to [email or web address]    Your next appointment:   3 month(s)  Provider:   PMertie Moores MD

## 2022-05-17 ENCOUNTER — Other Ambulatory Visit: Payer: Self-pay

## 2022-05-17 LAB — LIPOPROTEIN A (LPA): Lipoprotein (a): 14.8 nmol/L (ref <75.0)

## 2022-05-22 ENCOUNTER — Inpatient Hospital Stay: Payer: 59 | Attending: Oncology

## 2022-05-22 ENCOUNTER — Inpatient Hospital Stay (HOSPITAL_BASED_OUTPATIENT_CLINIC_OR_DEPARTMENT_OTHER): Payer: 59 | Admitting: Internal Medicine

## 2022-05-22 ENCOUNTER — Inpatient Hospital Stay: Payer: 59

## 2022-05-22 VITALS — BP 122/81 | HR 81 | Temp 97.7°F | Resp 18 | Ht 69.0 in | Wt 243.4 lb

## 2022-05-22 DIAGNOSIS — C642 Malignant neoplasm of left kidney, except renal pelvis: Secondary | ICD-10-CM | POA: Insufficient documentation

## 2022-05-22 DIAGNOSIS — C7931 Secondary malignant neoplasm of brain: Secondary | ICD-10-CM | POA: Diagnosis not present

## 2022-05-22 DIAGNOSIS — Z79620 Long term (current) use of immunosuppressive biologic: Secondary | ICD-10-CM | POA: Diagnosis not present

## 2022-05-22 DIAGNOSIS — Z5112 Encounter for antineoplastic immunotherapy: Secondary | ICD-10-CM | POA: Diagnosis present

## 2022-05-22 LAB — CMP (CANCER CENTER ONLY)
ALT: 40 U/L (ref 0–44)
AST: 35 U/L (ref 15–41)
Albumin: 4.3 g/dL (ref 3.5–5.0)
Alkaline Phosphatase: 80 U/L (ref 38–126)
Anion gap: 8 (ref 5–15)
BUN: 19 mg/dL (ref 8–23)
CO2: 25 mmol/L (ref 22–32)
Calcium: 8.7 mg/dL — ABNORMAL LOW (ref 8.9–10.3)
Chloride: 106 mmol/L (ref 98–111)
Creatinine: 1.18 mg/dL (ref 0.61–1.24)
GFR, Estimated: >60 mL/min (ref >60)
Glucose, Bld: 105 mg/dL — ABNORMAL HIGH (ref 70–99)
Potassium: 4 mmol/L (ref 3.5–5.1)
Sodium: 139 mmol/L (ref 135–145)
Total Bilirubin: 0.3 mg/dL (ref 0.3–1.2)
Total Protein: 7.1 g/dL (ref 6.5–8.1)

## 2022-05-22 LAB — CBC WITH DIFFERENTIAL (CANCER CENTER ONLY)
Abs Immature Granulocytes: 0.02 10*3/uL (ref 0.00–0.07)
Basophils Absolute: 0.1 10*3/uL (ref 0.0–0.1)
Basophils Relative: 1 %
Eosinophils Absolute: 0.4 10*3/uL (ref 0.0–0.5)
Eosinophils Relative: 5 %
HCT: 43.1 % (ref 39.0–52.0)
Hemoglobin: 14.6 g/dL (ref 13.0–17.0)
Immature Granulocytes: 0 %
Lymphocytes Relative: 39 %
Lymphs Abs: 2.9 10*3/uL (ref 0.7–4.0)
MCH: 29.9 pg (ref 26.0–34.0)
MCHC: 33.9 g/dL (ref 30.0–36.0)
MCV: 88.3 fL (ref 80.0–100.0)
Monocytes Absolute: 0.8 10*3/uL (ref 0.1–1.0)
Monocytes Relative: 10 %
Neutro Abs: 3.2 10*3/uL (ref 1.7–7.7)
Neutrophils Relative %: 45 %
Platelet Count: 249 10*3/uL (ref 150–400)
RBC: 4.88 MIL/uL (ref 4.22–5.81)
RDW: 14.9 % (ref 11.5–15.5)
WBC Count: 7.3 10*3/uL (ref 4.0–10.5)
nRBC: 0 % (ref 0.0–0.2)

## 2022-05-22 LAB — TSH: TSH: <0.01 u[IU]/mL — ABNORMAL LOW (ref 0.350–4.500)

## 2022-05-22 MED ORDER — SODIUM CHLORIDE 0.9 % IV SOLN: Freq: Once | INTRAVENOUS | Status: AC

## 2022-05-22 MED ORDER — SODIUM CHLORIDE 0.9 % IV SOLN
200.0000 mg | Freq: Once | INTRAVENOUS | Status: AC
  Administered 2022-05-22: 200 mg via INTRAVENOUS
  Filled 2022-05-22: qty 8

## 2022-05-22 NOTE — Patient Instructions (Signed)
[facility]  Discharge Instructions: Thank you for choosing [facility] to provide your oncology and hematology care.   If you have a lab appointment with the [facility], please go directly to the [facility] and check in at the registration area.   Wear comfortable clothing and clothing appropriate for easy access to any Portacath or PICC line.   We strive to give you quality time with your provider. You may need to reschedule your appointment if you arrive late (15 or more minutes).  Arriving late affects you and other patients whose appointments are after yours.  Also, if you miss three or more appointments without notifying the office, you may be dismissed from the clinic at the provider's discretion.      For prescription refill requests, have your pharmacy contact our office and allow 72 hours for refills to be completed.    Today you received the following chemotherapy and/or immunotherapy agents: Keytruda      To help prevent nausea and vomiting after your treatment, we encourage you to take your nausea medication as directed.  BELOW ARE SYMPTOMS THAT SHOULD BE REPORTED IMMEDIATELY: *FEVER GREATER THAN 100.4 F (38 C) OR HIGHER *CHILLS OR SWEATING *NAUSEA AND VOMITING THAT IS NOT CONTROLLED WITH YOUR NAUSEA MEDICATION *UNUSUAL SHORTNESS OF BREATH *UNUSUAL BRUISING OR BLEEDING *URINARY PROBLEMS (pain or burning when urinating, or frequent urination) *BOWEL PROBLEMS (unusual diarrhea, constipation, pain near the anus) TENDERNESS IN MOUTH AND THROAT WITH OR WITHOUT PRESENCE OF ULCERS (sore throat, sores in mouth, or a toothache) UNUSUAL RASH, SWELLING OR PAIN  UNUSUAL VAGINAL DISCHARGE OR ITCHING   Items with * indicate a potential emergency and should be followed up as soon as possible or go to the Emergency Department if any problems should occur.  Please show the CHEMOTHERAPY ALERT CARD or IMMUNOTHERAPY ALERT CARD at  check-in to the Emergency Department and triage nurse.  Should you have questions after your visit or need to cancel or reschedule your appointment, please contact [facility]  Dept: [phone]  and follow the prompts.  Office hours are 8:00 a.m. to 4:30 p.m. Monday - Friday. Please note that voicemails left after 4:00 p.m. may not be returned until the following business day.  We are closed weekends and major holidays. You have access to a nurse at all times for urgent questions. Please call the main number to the clinic Dept: [phone] and follow the prompts.   For any non-urgent questions, you may also contact your provider using MyChart. We now offer e-Visits for anyone 14 and older to request care online for non-urgent symptoms. For details visit [email or web address].   Also download the MyChart app! Go to the app store, search "MyChart", open the app, select [facility], and log in with your MyChart username and password.

## 2022-05-22 NOTE — Progress Notes (Signed)
Patient seen by MD today  Vitals are within treatment parameters.  Labs reviewed: and are within treatment parameters.  Per physician team, patient is ready for treatment and there are NO modifications to the treatment plan.

## 2022-05-22 NOTE — Progress Notes (Signed)
[place] [phone]   Fax:([phone]  OFFICE PROGRESS NOTE  [name], [name], [place] 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    PRIOR THERAPY: 1) status post robotic assisted laparoscopic left radical nephrectomy by Dr. Lovena Neighbours on January 05, 2020.  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  The final pathological staging was T3aN0 grade 2 tumor.     2) Status post radiation therapy to the pulmonary nodules completed March 2023.  He received 50 Gray in 5 fractions.   3) Status post cryoablation to 2 lesions in the right kidney completed on Aug 23, 2021.   4) status post endoscopic transsphenoidal pituitary resection completed by Dr. Wilburn Cornelia on November 08, 2021.  The final pathology showed clear-cell renal cell carcinoma.   5) SRS treatment to pituitary bed completed in October 2023.  He received 5 fractions for a total of 25 Gray.  CURRENT THERAPY: Keytruda 200 Mg IV every 3 weeks started Aug 04, 2021 status post 8 cycles with axitinib 5 mg p.o. daily started February 09, 2022.  INTERVAL HISTORY: Marc [name] 67 y.o. male returns to the clinic today for follow-up visit.  The patient is feeling fine today with no concerning complaints.  He has been tolerating his treatment with Keytruda and axitinib fairly well.  He denied having any current chest pain, shortness of breath, cough or hemoptysis.  He has no nausea, vomiting, diarrhea or constipation.  He has no headache or visual changes.  He has no fever or chills.  He intentionally lost few pounds recently.  He is here today for evaluation before starting cycle #9 of his treatment with Keytruda.  MEDICAL HISTORY: Past Medical History:  Diagnosis  Date   Arthritis    Cancer ([place])    Kidney, Lung, Pituitary   Hyperlipidemia    Hypothyroidism     ALLERGIES:  has No Known Allergies.  MEDICATIONS:  Current Outpatient Medications  Medication Sig Dispense Refill   Ascorbic Acid (VITAMIN C PO) Take 1 tablet by mouth every evening.     aspirin EC 81 MG tablet Take 81 mg by mouth every evening. Swallow whole.     axitinib (INLYTA) 5 MG tablet Take 1 tablet (5 mg total) by mouth daily. 30 tablet 3   cholecalciferol (VITAMIN D3) 25 MCG (1000 UNIT) tablet Take 1,000 Units by mouth every evening.     desmopressin (DDAVP) 0.1 MG tablet Take 0.5 tablets (0.05 mg total) by mouth 2 (two) times daily. (Patient not taking: Reported on 05/16/2022) 60 tablet 2   Ferrous Sulfate (IRON SLOW RELEASE PO) Take 1 tablet by mouth every evening.     HYDROcodone-acetaminophen (NORCO) 5-325 MG tablet Take 1-2 tablets by mouth every 6 (six) hours as needed for moderate pain. (Patient not taking: Reported on 12/22/2021) 20 tablet 0   HYDROcodone-acetaminophen (NORCO/VICODIN) 5-325 MG tablet Take 1 tablet by mouth every 4 (four) hours as needed for moderate pain. (Patient not taking: Reported on 12/22/2021) 30 tablet 0   hydrocortisone (CORTEF) 10 MG tablet TAKE 1.5 TABLETS BY MOUTH EVERY MORNING AND 1 TABLET AT 4PM FOR PAIN 75 tablet 2   ibuprofen (ADVIL) 200 MG tablet Take 400-600 mg by mouth every 6 (six) hours as needed  for moderate pain.     levothyroxine (SYNTHROID) 88 MCG tablet Take 1 tablet (88 mcg total) by mouth daily. 90 tablet 3   Multiple Vitamin (MULTI VITAMIN) TABS 1 tablet Orally Once a day for 30 day(s)     Multiple Vitamins-Minerals (MULTIVITAMIN WITH MINERALS) tablet Take 1 tablet by mouth every evening. (Patient not taking: Reported on 05/16/2022)     Omega-3 Fatty Acids (FISH OIL PO) Take 1 capsule by mouth every evening.     prochlorperazine (COMPAZINE) 10 MG tablet Take 1 tablet (10 mg total) by mouth every 6 (six) hours as needed for nausea or  vomiting. 30 tablet 0   rosuvastatin (CRESTOR) 20 MG tablet Take 1 tablet (20 mg total) by mouth daily. 90 tablet 3   Testosterone (ANDROGEL) 20.25 MG/1.25GM (1.62%) GEL Place 1 Pump onto the skin as directed. 1 pump to each shoulder daily 75 g 5   vitamin E 180 MG (400 UNITS) capsule Take 400 Units by mouth every evening.     No current facility-administered medications for this visit.    SURGICAL HISTORY:  Past Surgical History:  Procedure Laterality Date   CRANIOTOMY N/A 11/08/2021   Procedure: ENDOSCOPIC ENDONASAL RESECTION OF SELLAR MASS;  Surgeon: Judith Part, MD;  Location: [facility];  Service: Neurosurgery;  Laterality: N/A;  RM 20   IR RADIOLOGIST EVAL & MGMT  07/14/2021   IR RADIOLOGIST EVAL & MGMT  07/27/2021   IR RADIOLOGIST EVAL & MGMT  01/30/2022   RADIOFREQUENCY ABLATION N/A 08/23/2021   Procedure: RENAL CRYO ABLATION;  Surgeon: Greggory Keen, MD;  Location: [facility];  Service: Anesthesiology;  Laterality: N/A;   right shoulder rotator cuff repair Right    ROBOT ASSISTED LAPAROSCOPIC NEPHRECTOMY Left 01/05/2020   Procedure: XI ROBOTIC ASSISTED LAPAROSCOPIC RADICAL NEPHRECTOMY;  Surgeon: Ceasar Mons, MD;  Location: [facility];  Service: Urology;  Laterality: Left;   TOTAL HIP ARTHROPLASTY Left 03/31/2020   Procedure: LEFT TOTAL HIP ARTHROPLASTY ANTERIOR APPROACH;  Surgeon: Mcarthur Rossetti, MD;  Location: [facility];  Service: Orthopedics;  Laterality: Left;   TRANSPHENOIDAL APPROACH EXPOSURE N/A 11/08/2021   Procedure: TRANSPHENOIDAL APPROACH EXPOSURE;  Surgeon: Jerrell Belfast, MD;  Location: [facility];  Service: ENT;  Laterality: N/A;    REVIEW OF SYSTEMS:  A comprehensive review of systems was negative.   PHYSICAL EXAMINATION: General appearance: alert, cooperative, and no distress Head: Normocephalic, without obvious abnormality, atraumatic Neck: no adenopathy, no JVD, supple, symmetrical, trachea midline, and thyroid not enlarged, symmetric, no  tenderness/mass/nodules Lymph nodes: Cervical, supraclavicular, and axillary nodes normal. Resp: clear to auscultation bilaterally Back: symmetric, no curvature. ROM normal. No CVA tenderness. Cardio: regular rate and rhythm, S1, S2 normal, no murmur, click, rub or gallop GI: soft, non-tender; bowel sounds normal; no masses,  no organomegaly Extremities: extremities normal, atraumatic, no cyanosis or edema  ECOG PERFORMANCE STATUS: 1 - Symptomatic but completely ambulatory  Blood pressure 122/81, pulse 81, temperature 97.7 F (36.5 C), resp. rate 18, height 5' 9"$  (1.753 m), weight 243 lb 6.4 oz (110.4 kg), SpO2 98 %.  LABORATORY DATA: Lab Results  Component Value Date   WBC 7.3 05/22/2022   HGB 14.6 05/22/2022   HCT 43.1 05/22/2022   MCV 88.3 05/22/2022   PLT 249 05/22/2022      Chemistry      Component Value Date/Time   NA 137 05/01/2022 0751   K 4.1 05/01/2022 0751   CL 105 05/01/2022 0751   CO2 24 05/01/2022 0751  BUN 20 05/01/2022 0751   CREATININE 1.19 05/01/2022 0751      Component Value Date/Time   CALCIUM 8.9 05/01/2022 0751   ALKPHOS 77 05/01/2022 0751   AST 29 05/01/2022 0751   ALT 33 05/01/2022 0751   BILITOT 0.5 05/01/2022 0751       RADIOGRAPHIC STUDIES: MR Brain W Wo Contrast  Result Date: 04/27/2022 CLINICAL DATA:  Brain metastases. Pituitary protocol. Follow-up post radiation 3 months ago. EXAM: MRI HEAD WITHOUT AND WITH CONTRAST TECHNIQUE: Multiplanar, multiecho pulse sequences of the brain and surrounding structures were obtained without and with intravenous contrast. CONTRAST:  10 mL [facility] COMPARISON:  Brain MRI 01/17/2022. FINDINGS: BRAIN New Lesions: None. Larger lesions: None. Stable or Smaller lesions: Stable postoperative changes from prior trans-sphenoidal debulking of the sellar mass. Interval decrease in size of the residual enhancing tissue along the right aspect of the sella and involving the pituitary infundibulum, now measuring up to 8.9  x 7.5 mm on sagittal image 6 series 23. Other Brain findings: No acute infarct or hemorrhage. No hydrocephalus or extra-axial collection. Vascular: Enhancing atherosclerotic plaque along the right ICA cavernous segment without hemodynamically significant stenosis. Skull and upper cervical spine: Normal marrow signal. Sinuses/Orbits: Postoperative changes of trans-sphenoidal approach to the sella. Paranasal sinuses, mastoid air cells, and middle ear cavities are well aerated. Orbits are unremarkable. Other: None. IMPRESSION: 1. Interval decrease in size of the residual enhancing tissue along the right aspect of the sella and involving the pituitary infundibulum, consistent with response to radiation. 2. No new lesions. Electronically Signed   By: Emmit Alexanders M.D.   On: 04/27/2022 09:33    ASSESSMENT AND PLAN: This is a very pleasant 68 years old white male diagnosed with a stage IV clear-cell renal cell carcinoma with pulmonary involvement as well as pituitary metastasis in 2023.  The patient was initially diagnosed as stage III and October 2021 status post robotic assisted laparoscopic left radical nephrectomy followed by radiation therapy to the pulmonary nodules in March 2023 followed by cryoablation to 2 lesions in the right kidney completed in May 2023.  He is also status post endoscopic transsphenoidal pituitary resection on November 08, 2021.  He is also status post SRS to the pituitary bed in October 2023. The patient is currently undergoing treatment with Keytruda 200 Mg IV every 3 weeks status post 8 cycles.  This was started on Aug 04, 2021.  He is also on axitinib 5 mg p.o. daily since November 2023. The patient has been tolerating this treatment well with no concerning adverse effects. I recommended for him to proceed with cycle #9 today as planned. I will see him back for follow-up visit in 3 weeks for evaluation before starting cycle #10. The patient was advised to call immediately if he has any  other concerning symptoms in the interval. The patient voices understanding of current disease status and treatment options and is in agreement with the current care plan.  All questions were answered. The patient knows to call the clinic with any problems, questions or concerns. We can certainly see the patient much sooner if necessary.  The total time spent in the appointment was 20 minutes.  Disclaimer: This note was dictated with voice recognition software. Similar sounding words can inadvertently be transcribed and may not be corrected upon review.

## 2022-05-23 LAB — T4: T4, Total: 6.3 ug/dL (ref 4.5–12.0)

## 2022-06-04 ENCOUNTER — Other Ambulatory Visit: Payer: Self-pay | Admitting: Internal Medicine

## 2022-06-04 ENCOUNTER — Telehealth: Payer: Self-pay | Admitting: Medical Oncology

## 2022-06-04 DIAGNOSIS — C642 Malignant neoplasm of left kidney, except renal pelvis: Secondary | ICD-10-CM

## 2022-06-04 MED ORDER — AXITINIB 5 MG PO TABS: 5.0000 mg | ORAL_TABLET | Freq: Every day | ORAL | 3 refills | Status: DC

## 2022-06-04 NOTE — Telephone Encounter (Signed)
Request to refill Axitinib # 30 tablets please with 3 refills .  Environmental health practitioner in  [place]

## 2022-06-12 ENCOUNTER — Inpatient Hospital Stay: Payer: 59

## 2022-06-12 ENCOUNTER — Other Ambulatory Visit: Payer: Self-pay

## 2022-06-12 ENCOUNTER — Inpatient Hospital Stay (HOSPITAL_BASED_OUTPATIENT_CLINIC_OR_DEPARTMENT_OTHER): Payer: 59 | Admitting: Internal Medicine

## 2022-06-12 ENCOUNTER — Inpatient Hospital Stay: Payer: 59 | Attending: Oncology

## 2022-06-12 VITALS — BP 113/73 | HR 64 | Temp 97.8°F | Resp 16

## 2022-06-12 DIAGNOSIS — R197 Diarrhea, unspecified: Secondary | ICD-10-CM | POA: Insufficient documentation

## 2022-06-12 DIAGNOSIS — Z5112 Encounter for antineoplastic immunotherapy: Secondary | ICD-10-CM | POA: Insufficient documentation

## 2022-06-12 DIAGNOSIS — C7800 Secondary malignant neoplasm of unspecified lung: Secondary | ICD-10-CM | POA: Insufficient documentation

## 2022-06-12 DIAGNOSIS — C642 Malignant neoplasm of left kidney, except renal pelvis: Secondary | ICD-10-CM | POA: Diagnosis not present

## 2022-06-12 DIAGNOSIS — Z79620 Long term (current) use of immunosuppressive biologic: Secondary | ICD-10-CM | POA: Insufficient documentation

## 2022-06-12 DIAGNOSIS — C7989 Secondary malignant neoplasm of other specified sites: Secondary | ICD-10-CM | POA: Insufficient documentation

## 2022-06-12 LAB — CBC WITH DIFFERENTIAL (CANCER CENTER ONLY)
Abs Immature Granulocytes: 0.01 10*3/uL (ref 0.00–0.07)
Basophils Absolute: 0.1 10*3/uL (ref 0.0–0.1)
Basophils Relative: 1 %
Eosinophils Absolute: 0.4 10*3/uL (ref 0.0–0.5)
Eosinophils Relative: 5 %
HCT: 42.5 % (ref 39.0–52.0)
Hemoglobin: 14.1 g/dL (ref 13.0–17.0)
Immature Granulocytes: 0 %
Lymphocytes Relative: 42 %
Lymphs Abs: 3.2 10*3/uL (ref 0.7–4.0)
MCH: 29.7 pg (ref 26.0–34.0)
MCHC: 33.2 g/dL (ref 30.0–36.0)
MCV: 89.5 fL (ref 80.0–100.0)
Monocytes Absolute: 0.8 10*3/uL (ref 0.1–1.0)
Monocytes Relative: 10 %
Neutro Abs: 3.1 10*3/uL (ref 1.7–7.7)
Neutrophils Relative %: 42 %
Platelet Count: 232 10*3/uL (ref 150–400)
RBC: 4.75 MIL/uL (ref 4.22–5.81)
RDW: 14.7 % (ref 11.5–15.5)
WBC Count: 7.5 10*3/uL (ref 4.0–10.5)
nRBC: 0 % (ref 0.0–0.2)

## 2022-06-12 LAB — CMP (CANCER CENTER ONLY)
ALT: 35 U/L (ref 0–44)
AST: 30 U/L (ref 15–41)
Albumin: 4.2 g/dL (ref 3.5–5.0)
Alkaline Phosphatase: 71 U/L (ref 38–126)
Anion gap: 7 (ref 5–15)
BUN: 17 mg/dL (ref 8–23)
CO2: 27 mmol/L (ref 22–32)
Calcium: 8.9 mg/dL (ref 8.9–10.3)
Chloride: 108 mmol/L (ref 98–111)
Creatinine: 1.22 mg/dL (ref 0.61–1.24)
GFR, Estimated: >60 mL/min (ref >60)
Glucose, Bld: 86 mg/dL (ref 70–99)
Potassium: 3.9 mmol/L (ref 3.5–5.1)
Sodium: 142 mmol/L (ref 135–145)
Total Bilirubin: 0.3 mg/dL (ref 0.3–1.2)
Total Protein: 7.1 g/dL (ref 6.5–8.1)

## 2022-06-12 LAB — TSH: TSH: <1.01 u[IU]/mL (ref 0.350–4.500)

## 2022-06-12 MED ORDER — SODIUM CHLORIDE 0.9 % IV SOLN
200.0000 mg | Freq: Once | INTRAVENOUS | Status: AC
  Administered 2022-06-12: 200 mg via INTRAVENOUS
  Filled 2022-06-12: qty 8

## 2022-06-12 MED ORDER — SODIUM CHLORIDE 0.9 % IV SOLN: Freq: Once | INTRAVENOUS | Status: AC

## 2022-06-12 NOTE — Patient Instructions (Signed)
[facility]  Discharge Instructions: Thank you for choosing [facility] to provide your oncology and hematology care.   If you have a lab appointment with the Cancer Center, please go directly to the Cancer Center and check in at the registration area.   Wear comfortable clothing and clothing appropriate for easy access to any Portacath or PICC line.   We strive to give you quality time with your provider. You may need to reschedule your appointment if you arrive late (15 or more minutes).  Arriving late affects you and other patients whose appointments are after yours.  Also, if you miss three or more appointments without notifying the office, you may be dismissed from the clinic at the provider's discretion.      For prescription refill requests, have your pharmacy contact our office and allow 72 hours for refills to be completed.    Today you received the following chemotherapy and/or immunotherapy agents: Keytruda      To help prevent nausea and vomiting after your treatment, we encourage you to take your nausea medication as directed.  BELOW ARE SYMPTOMS THAT SHOULD BE REPORTED IMMEDIATELY: *FEVER GREATER THAN 100.4 F (38 C) OR HIGHER *CHILLS OR SWEATING *NAUSEA AND VOMITING THAT IS NOT CONTROLLED WITH YOUR NAUSEA MEDICATION *UNUSUAL SHORTNESS OF BREATH *UNUSUAL BRUISING OR BLEEDING *URINARY PROBLEMS (pain or burning when urinating, or frequent urination) *BOWEL PROBLEMS (unusual diarrhea, constipation, pain near the anus) TENDERNESS IN MOUTH AND THROAT WITH OR WITHOUT PRESENCE OF ULCERS (sore throat, sores in mouth, or a toothache) UNUSUAL RASH, SWELLING OR PAIN  UNUSUAL VAGINAL DISCHARGE OR ITCHING   Items with * indicate a potential emergency and should be followed up as soon as possible or go to the Emergency Department if any problems should occur.  Please show the CHEMOTHERAPY ALERT CARD or IMMUNOTHERAPY ALERT CARD at  check-in to the Emergency Department and triage nurse.  Should you have questions after your visit or need to cancel or reschedule your appointment, please contact [facility]  Dept: [phone]  and follow the prompts.  Office hours are 8:00 a.m. to 4:30 p.m. Monday - Friday. Please note that voicemails left after 4:00 p.m. may not be returned until the following business day.  We are closed weekends and major holidays. You have access to a nurse at all times for urgent questions. Please call the main number to the clinic Dept: [phone] and follow the prompts.   For any non-urgent questions, you may also contact your provider using MyChart. We now offer e-Visits for anyone 18 and older to request care online for non-urgent symptoms. For details visit [email or web address].   Also download the MyChart app! Go to the app store, search "MyChart", open the app, select [facility], and log in with your MyChart username and password.   

## 2022-06-12 NOTE — Progress Notes (Signed)
[name] Telephone:([phone]   Fax:(336) [phone]  OFFICE PROGRESS NOTE  [name], [name], PA 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    PRIOR THERAPY: 1) status post robotic assisted laparoscopic left radical nephrectomy by Dr. Lovena Neighbours on January 05, 2020.  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  The final pathological staging was T3aN0 grade 2 tumor.     2) Status post radiation therapy to the pulmonary nodules completed March 2023.  He received 50 Gray in 5 fractions.   3) Status post cryoablation to 2 lesions in the right kidney completed on Aug 23, 2021.   4) status post endoscopic transsphenoidal pituitary resection completed by Dr. Wilburn Cornelia on November 08, 2021.  The final pathology showed clear-cell renal cell carcinoma.   5) SRS treatment to pituitary bed completed in October 2023.  He received 5 fractions for a total of 25 Gray.  CURRENT THERAPY: Keytruda 200 Mg IV every 3 weeks started Aug 04, 2021 status post 9 cycles with axitinib 5 mg p.o. daily started February 09, 2022.  INTERVAL HISTORY: Marc [name] 67 y.o. male returns to the clinic today for follow-up visit.  The patient is feeling fine today with no concerning complaints except for intermittent diarrhea and he takes some Imodium for it with some improvement.  He denied having any current chest pain, shortness of breath, cough or hemoptysis.  He has no nausea, vomiting, diarrhea or constipation.  He has no headache or visual changes.  He denied having any significant weight loss or night sweats.  He has no fever or chills.  He is here today for evaluation before starting cycle #10 of his treatment.  MEDICAL HISTORY: Past Medical  History:  Diagnosis Date   Arthritis    Cancer ([place])    Kidney, Lung, Pituitary   Hyperlipidemia    Hypothyroidism     ALLERGIES:  has No Known Allergies.  MEDICATIONS:  Current Outpatient Medications  Medication Sig Dispense Refill   Ascorbic Acid (VITAMIN C PO) Take 1 tablet by mouth every evening.     aspirin EC 81 MG tablet Take 81 mg by mouth every evening. Swallow whole.     axitinib (INLYTA) 5 MG tablet Take 1 tablet (5 mg total) by mouth daily. 30 tablet 3   cholecalciferol (VITAMIN D3) 25 MCG (1000 UNIT) tablet Take 1,000 Units by mouth every evening.     desmopressin (DDAVP) 0.1 MG tablet Take 0.5 tablets (0.05 mg total) by mouth 2 (two) times daily. (Patient not taking: Reported on 05/16/2022) 60 tablet 2   Ferrous Sulfate (IRON SLOW RELEASE PO) Take 1 tablet by mouth every evening.     HYDROcodone-acetaminophen (NORCO) 5-325 MG tablet Take 1-2 tablets by mouth every 6 (six) hours as needed for moderate pain. (Patient not taking: Reported on 12/22/2021) 20 tablet 0   HYDROcodone-acetaminophen (NORCO/VICODIN) 5-325 MG tablet Take 1 tablet by mouth every 4 (four) hours as needed for moderate pain. (Patient not taking: Reported on 12/22/2021) 30 tablet 0   hydrocortisone (CORTEF) 10 MG tablet TAKE 1.5 TABLETS BY MOUTH EVERY MORNING AND 1 TABLET AT 4PM FOR PAIN 75 tablet 2   ibuprofen (ADVIL) 200 MG tablet Take 400-600 mg by mouth every 6 (six)  hours as needed for moderate pain.     levothyroxine (SYNTHROID) 88 MCG tablet Take 1 tablet (88 mcg total) by mouth daily. 90 tablet 3   Multiple Vitamin (MULTI VITAMIN) TABS 1 tablet Orally Once a day for 30 day(s)     Multiple Vitamins-Minerals (MULTIVITAMIN WITH MINERALS) tablet Take 1 tablet by mouth every evening. (Patient not taking: Reported on 05/16/2022)     Omega-3 Fatty Acids (FISH OIL PO) Take 1 capsule by mouth every evening.     prochlorperazine (COMPAZINE) 10 MG tablet Take 1 tablet (10 mg total) by mouth every 6 (six) hours as  needed for nausea or vomiting. 30 tablet 0   rosuvastatin (CRESTOR) 20 MG tablet Take 1 tablet (20 mg total) by mouth daily. 90 tablet 3   Testosterone (ANDROGEL) 20.25 MG/1.25GM (1.62%) GEL Place 1 Pump onto the skin as directed. 1 pump to each shoulder daily 75 g 5   vitamin E 180 MG (400 UNITS) capsule Take 400 Units by mouth every evening.     No current facility-administered medications for this visit.    SURGICAL HISTORY:  Past Surgical History:  Procedure Laterality Date   CRANIOTOMY N/A 11/08/2021   Procedure: ENDOSCOPIC ENDONASAL RESECTION OF SELLAR MASS;  Surgeon: Judith Part, MD;  Location: [facility];  Service: Neurosurgery;  Laterality: N/A;  RM 20   IR RADIOLOGIST EVAL & MGMT  07/14/2021   IR RADIOLOGIST EVAL & MGMT  07/27/2021   IR RADIOLOGIST EVAL & MGMT  01/30/2022   RADIOFREQUENCY ABLATION N/A 08/23/2021   Procedure: RENAL CRYO ABLATION;  Surgeon: Greggory Keen, MD;  [facility];  Service: Anesthesiology;  Laterality: N/A;   right shoulder rotator cuff repair Right    ROBOT ASSISTED LAPAROSCOPIC NEPHRECTOMY Left 01/05/2020   Procedure: XI ROBOTIC ASSISTED LAPAROSCOPIC RADICAL NEPHRECTOMY;  Surgeon: Ceasar Mons, MD;  Location[facility];  Service: Urology;  Laterality: Left;   TOTAL HIP ARTHROPLASTY Left 03/31/2020   Procedure: LEFT TOTAL HIP ARTHROPLASTY ANTERIOR APPROACH;  Surgeon: Mcarthur Rossetti, MD;  Location: [facility];  Service: Orthopedics;  Laterality: Left;   TRANSPHENOIDAL APPROACH EXPOSURE N/A 11/08/2021   Procedure: TRANSPHENOIDAL APPROACH EXPOSURE;  Surgeon: Jerrell Belfast, MD;  Location: [facility];  Service: ENT;  Laterality: N/A;    REVIEW OF SYSTEMS:  A comprehensive review of systems was negative except for: Gastrointestinal: positive for diarrhea   PHYSICAL EXAMINATION: General appearance: alert, cooperative, and no distress Head: Normocephalic, without obvious abnormality, atraumatic Neck: no adenopathy, no JVD, supple, symmetrical,  trachea midline, and thyroid not enlarged, symmetric, no tenderness/mass/nodules Lymph nodes: Cervical, supraclavicular, and axillary nodes normal. Resp: clear to auscultation bilaterally Back: symmetric, no curvature. ROM normal. No CVA tenderness. Cardio: regular rate and rhythm, S1, S2 normal, no murmur, click, rub or gallop GI: soft, non-tender; bowel sounds normal; no masses,  no organomegaly Extremities: extremities normal, atraumatic, no cyanosis or edema  ECOG PERFORMANCE STATUS: 1 - Symptomatic but completely ambulatory  Blood pressure 123/76, pulse 84, temperature 97.8 F (36.6 C), temperature source Temporal, resp. rate 17, height '5\' 9"'$  (1.753 m), weight 242 lb 3.2 oz (109.9 kg), SpO2 98 %.  LABORATORY DATA: Lab Results  Component Value Date   WBC 7.5 06/12/2022   HGB 14.1 06/12/2022   HCT 42.5 06/12/2022   MCV 89.5 06/12/2022   PLT 232 06/12/2022      Chemistry      Component Value Date/Time   NA 139 05/22/2022 0756   K 4.0 05/22/2022 0756  CL 106 05/22/2022 0756   CO2 25 05/22/2022 0756   BUN 19 05/22/2022 0756   CREATININE 1.18 05/22/2022 0756      Component Value Date/Time   CALCIUM 8.7 (L) 05/22/2022 0756   ALKPHOS 80 05/22/2022 0756   AST 35 05/22/2022 0756   ALT 40 05/22/2022 0756   BILITOT 0.3 05/22/2022 0756       RADIOGRAPHIC STUDIES: No results found.  ASSESSMENT AND PLAN: This is a very pleasant 67 years old white male diagnosed with a stage IV clear-cell renal cell carcinoma with pulmonary involvement as well as pituitary metastasis in 2023.  The patient was initially diagnosed as stage III and October 2021 status post robotic assisted laparoscopic left radical nephrectomy followed by radiation therapy to the pulmonary nodules in March 2023 followed by cryoablation to 2 lesions in the right kidney completed in May 2023.  He is also status post endoscopic transsphenoidal pituitary resection on November 08, 2021.  He is also status post SRS to the  pituitary bed in October 2023. The patient is currently undergoing treatment with Keytruda 200 Mg IV every 3 weeks status post 9 cycles.  This was started on Aug 04, 2021.  He is also on axitinib 5 mg p.o. daily since November 2023. The patient has been tolerating this treatment fairly well except for occasional diarrhea. I recommended for him to proceed with cycle #10 today as planned. I will see him back for follow-up visit in 3 weeks for evaluation before starting cycle #11. The patient was advised to call immediately if he has any other concerning symptoms in the interval. The patient voices understanding of current disease status and treatment options and is in agreement with the current care plan.  All questions were answered. The patient knows to call the clinic with any problems, questions or concerns. We can certainly see the patient much sooner if necessary.  The total time spent in the appointment was 20 minutes.  Disclaimer: This note was dictated with voice recognition software. Similar sounding words can inadvertently be transcribed and may not be corrected upon review.

## 2022-06-12 NOTE — Progress Notes (Addendum)
Patient seen by MD today  Vitals are within treatment parameters.  Labs reviewed: and are within treatment parameters.  Per physician team, patient is ready for treatment and there are NO modifications to the treatment plan.  

## 2022-06-14 LAB — T4: T4, Total: 6.1 ug/dL (ref 4.5–12.0)

## 2022-06-15 ENCOUNTER — Telehealth: Payer: Self-pay | Admitting: *Deleted

## 2022-06-15 NOTE — Telephone Encounter (Signed)
   Pre-operative Risk Assessment    Patient Name: Marc [name]  DOB: February 16, 1956 MRN: [id number]      Request for Surgical Clearance    Procedure:   RIGHT TOTAL KNEE REPLACEMENT  Date of Surgery:  Clearance TBD                                 Surgeon:  DR. Marchia Bond  Surgeon's Group or Practice Name:  [facility] Phone number:  [phone] EXT [id number] ATTN: Derek Jack Fax number:  [phone]   Type of Clearance Requested:   - Medical ; ASA   Type of Anesthesia:   CHOICE   Additional requests/questions:    Jiles Prows   06/15/2022, 12:14 PM

## 2022-06-15 NOTE — Telephone Encounter (Signed)
   Name: Marc [name]  DOB: 03/17/1956  MRN: [id number]   Primary Cardiologist: Mertie Moores, MD  Chart reviewed as part of pre-operative protocol coverage. Patient was contacted 06/15/2022 in reference to pre-operative risk assessment for pending surgery as outlined below.  Marc [name] was last seen on 05/16/2022 by Dr. Acie Fredrickson.  Since that day, Marc [name] has done well from a cardiac standpoint.  He denies any new symptoms or concerns.  He is able to complete greater than 4 METS without difficulty.  Therefore, based on ACC/AHA guidelines, the patient would be at acceptable risk for the planned procedure without further cardiovascular testing.   Per office protocol, he may hold Aspirin for 5-7 days prior to procedure. Please resume Aspirin as soon as possible postprocedure, at the discretion of the surgeon.   The patient was advised that if he develops new symptoms prior to surgery to contact our office to arrange for a follow-up visit, and he verbalized understanding.  I will route this recommendation to the requesting party via Epic fax function and remove from pre-op pool. Please call with questions.  Lenna Sciara, NP 06/15/2022, 12:28 PM

## 2022-06-22 ENCOUNTER — Ambulatory Visit (INDEPENDENT_AMBULATORY_CARE_PROVIDER_SITE_OTHER): Payer: 59 | Admitting: Internal Medicine

## 2022-06-22 ENCOUNTER — Encounter: Payer: Self-pay | Admitting: Internal Medicine

## 2022-06-22 VITALS — BP 120/80 | HR 67 | Ht 69.0 in | Wt 241.0 lb

## 2022-06-22 DIAGNOSIS — R7989 Other specified abnormal findings of blood chemistry: Secondary | ICD-10-CM | POA: Diagnosis not present

## 2022-06-22 DIAGNOSIS — E038 Other specified hypothyroidism: Secondary | ICD-10-CM

## 2022-06-22 DIAGNOSIS — E893 Postprocedural hypopituitarism: Secondary | ICD-10-CM

## 2022-06-22 DIAGNOSIS — E2749 Other adrenocortical insufficiency: Secondary | ICD-10-CM

## 2022-06-22 MED ORDER — PREDNISONE 5 MG PO TABS: 7.5000 mg | ORAL_TABLET | Freq: Every day | ORAL | 3 refills | Status: DC

## 2022-06-22 MED ORDER — LEVOTHYROXINE SODIUM 88 MCG PO TABS: 88.0000 ug | ORAL_TABLET | Freq: Every day | ORAL | 3 refills | Status: DC

## 2022-06-22 NOTE — Patient Instructions (Addendum)
Switch Hydrocortisone to Prednisone 5 mg, 1.5 tablets with breakfast daily         ADRENAL INSUFFICIENCY SICK DAY RULES:  Should you face an extreme emotional or physical stress such as trauma, surgery or acute illness, this will require extra steroid coverage so that the body can meet that stress.   Without increasing the steroid dose you may experience severe weakness, headache, dizziness, nausea and vomiting and possibly a more serious deterioration in health.  Typically the dose of steroids will only need to be increased for a couple of days if you have an illness that is transient and managed in the community.   If you are unable to take/absorb an increased dose of steroids orally because of vomiting or diarrhea, you will urgently require steroid injections and should present to an Emergency Department.  The general advice for any serious illness is as follows: Double the normal daily steroid dose for up to 3 days if you have a temperature of more than 37.50C (99.29F) with signs of sickness, or severe emotional or physical distress Contact your primary care doctor and Endocrinologist if the illness worsens or it lasts for more than 3 days.  In cases of severe illness, urgent medical assistance should be promptly sought. If you experience vomiting/diarrhea or are unable to take steroids by mouth, please administer the Hydrocortisone injection kit and seek urgent medical help.

## 2022-06-22 NOTE — Progress Notes (Signed)
Name: Marc [name]  MRN/ DOB: [id number], January 15, 1956    Age/ Sex: 67 y.o., male    PCP: [name], PA   Reason for Endocrinology Evaluation: Low testosterone      Date of Initial Endocrinology Evaluation: 06/19/2021    HPI: Mr. [name] is a 67 y.o. 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 The patient presented for initial endocrinology clinic visit on 06/19/2021 for consultative assistance with his Low testosterone .   [name] was referred her for further evaluation of low testosterone and LH this was during work up for low libido. He denies erectile dysfunction but has noted decrease in spontaneous     Of note, he is S/P left nephrectomy due to [place] with Mets to the lungs  As/P cryoablation of 2 lesions in the right kidney completed 07/2021 Completed radiation therapy to pulmonary nodules 05/2021    He has never been on testosterone prior to his presentation to our clinic Had [place] in 01/2021 followed by long COVID, he felt terrible until he went on prednisone  which has helped   1/5th he saw ortho for leg weakness, PT worsened his condition . Steroid improved  his condition after taking them for  5 days , after that he noted decrease appetite, lost 40 lbs and feeling poorly again. Saw PCP 2/20th started on Prednisone 40 mg for 5 days.  He received another refill 3/3rd and has been taking 10 mg daily    He has no prostate cancer   No CAD  No FH of prostate cancer  He is an ex-smoke    He was started on HC with undetectable ACTH < 5 pg/mL and low serum cortisol 0.2ug/dL 06/20/2021  He was started on LT-4 replacement with a FT4 at 0.36 ng/dL , TSH inappropriately normal 06/20/2021   His testosterone was low at 5 ng/dL( [phone]) He was started on [place] nasal spray, this was cleared by his oncologist as the benefit outweighed the risk    He is s/p uncomplicated endoscopic endonasal resection of the  sellar tumor by Dr. Zada Finders on 11/08/2021, pathology report consistent with clear cell carcinoma  SUBJECTIVE:   Today (06/22/22): [name] is here for follow-up on hypopituitarism.   He was last seen by oncology 06/12/2022 He is s/p radiation therapy to the pulmonary nodules 05/2021  He is s/p endoscopic endonasal resection of a sellar tumor on 11/08/2021 as well as SRS treatment to the pituitary bed 12/2021, he received 5 fractions of a total of 25 Pearline Cables  He was started on Keytruda on 12/05/2021 Q 3 months  He is also on axitinib oral daily  Recent imaging of the chest/abdomen showed a mild increase in size of pleural-based mass, suspicious for progressive metastatic disease as well as a new small subpleural nodule along the posterior aspect of the right hemidiaphragm is nonspecific.    Pt has been noted with weight loss - intentional  Denies headaches  Visual changes have improved since pituitary sx  Has occasional diarrhea - on PRN imodium  Denies dizziness or nausea    HOME ENDOCRINE MEDICATIONS: Hydrocortisone 10 mg, 1.5 tabs every morning and 1 tab in the afternoon Levothyroxine 88 mcg daily AndroGel 1 pump to each shoulder      HISTORY:  Past Medical History:  Past Medical History:  Diagnosis Date   Arthritis    Cancer ([place])    Kidney, Lung, Pituitary  Hyperlipidemia    Hypothyroidism    Past Surgical History:  Past Surgical History:  Procedure Laterality Date   CRANIOTOMY N/A 11/08/2021   Procedure: ENDOSCOPIC ENDONASAL RESECTION OF SELLAR MASS;  Surgeon: Judith Part, MD;  Location: [place] [facility];  Service: Neurosurgery;  Laterality: N/A;  RM 20   IR RADIOLOGIST EVAL & MGMT  07/14/2021   IR RADIOLOGIST EVAL & MGMT  07/27/2021   IR RADIOLOGIST EVAL & MGMT  01/30/2022   RADIOFREQUENCY ABLATION N/A 08/23/2021   Procedure: RENAL CRYO ABLATION;  Surgeon: Greggory Keen, MD;  Location: [facility];  Service: Anesthesiology;  Laterality: N/A;   right shoulder rotator cuff  repair Right    ROBOT ASSISTED LAPAROSCOPIC NEPHRECTOMY Left 01/05/2020   Procedure: XI ROBOTIC ASSISTED LAPAROSCOPIC RADICAL NEPHRECTOMY;  Surgeon: Ceasar Mons, MD;  Location: [facility];  Service: Urology;  Laterality: Left;   TOTAL HIP ARTHROPLASTY Left 03/31/2020   Procedure: LEFT TOTAL HIP ARTHROPLASTY ANTERIOR APPROACH;  Surgeon: Mcarthur Rossetti, MD;  Location: [facility];  Service: Orthopedics;  Laterality: Left;   TRANSPHENOIDAL APPROACH EXPOSURE N/A 11/08/2021   Procedure: TRANSPHENOIDAL APPROACH EXPOSURE;  Surgeon: Jerrell Belfast, MD;  Location: [facility];  Service: ENT;  Laterality: N/A;    Social History:  reports that he quit smoking about 7 years ago. His smoking use included cigarettes. He has never used smokeless tobacco. He reports current alcohol use. He reports that he does not use drugs. Family History: family history is not on file.   HOME MEDICATIONS: Allergies as of 06/22/2022   No Known Allergies      Medication List        Accurate as of June 22, 2022 11:21 AM. If you have any questions, ask your nurse or doctor.          STOP taking these medications    desmopressin 0.1 MG tablet Commonly known as: DDAVP Stopped by: Dorita Sciara, MD   HYDROcodone-acetaminophen 5-325 MG tablet Commonly known as: NORCO/VICODIN Stopped by: Dorita Sciara, MD       TAKE these medications    aspirin EC 81 MG tablet Take 81 mg by mouth every evening. Swallow whole.   axitinib 5 MG tablet Commonly known as: INLYTA Take 1 tablet (5 mg total) by mouth daily.   cholecalciferol 25 MCG (1000 UNIT) tablet Commonly known as: VITAMIN D3 Take 1,000 Units by mouth every evening.   FISH OIL PO Take 1 capsule by mouth every evening.   hydrocortisone 10 MG tablet Commonly known as: CORTEF TAKE 1.5 TABLETS BY MOUTH EVERY MORNING AND 1 TABLET AT 4PM FOR PAIN   ibuprofen 200 MG tablet Commonly known as: ADVIL Take 400-600 mg by mouth every 6  (six) hours as needed for moderate pain.   IRON SLOW RELEASE PO Take 1 tablet by mouth every evening.   levothyroxine 88 MCG tablet Commonly known as: SYNTHROID Take 1 tablet (88 mcg total) by mouth daily.   Multi Vitamin Tabs 1 tablet Orally Once a day for 30 day(s)   multivitamin with minerals tablet Take 1 tablet by mouth every evening.   prochlorperazine 10 MG tablet Commonly known as: COMPAZINE Take 1 tablet (10 mg total) by mouth every 6 (six) hours as needed for nausea or vomiting.   rosuvastatin 20 MG tablet Commonly known as: CRESTOR Take 1 tablet (20 mg total) by mouth daily.   Testosterone 20.25 MG/1.25GM (1.62%) Gel Commonly known as: AndroGel Place 1 Pump onto the skin as directed. 1 pump  to each shoulder daily   VITAMIN C PO Take 1 tablet by mouth every evening.   vitamin E 180 MG (400 UNITS) capsule Take 400 Units by mouth every evening.          REVIEW OF SYSTEMS: A comprehensive ROS was conducted with the patient and is negative except as per HPI    OBJECTIVE:  VS: BP 120/80 (BP Location: Left Arm, Patient Position: Sitting, Cuff Size: Large)   Pulse 67   Ht 5\' 9"  (1.753 m)   Wt 241 lb (109.3 kg)   SpO2 97%   BMI 35.59 kg/m    Wt Readings from Last 3 Encounters:  06/22/22 241 lb (109.3 kg)  06/12/22 242 lb 3.2 oz (109.9 kg)  05/22/22 243 lb 6.4 oz (110.4 kg)   Body surface area is 2.31 meters squared.   EXAM: General: Pt appears well and is in NAD  Neck: General: Supple without adenopathy. Thyroid: Thyroid size normal.  No goiter or nodules appreciated.   Lungs: Clear with good BS bilat with no rales, rhonchi, or wheezes  Heart: Auscultation: RRR.  Abdomen: Normoactive bowel sounds, soft, nontender, without masses or organomegaly palpable  Extremities:  BL LE: Tace  pretibial edema  Mental Status: Judgment, insight: Intact Orientation: Oriented to time, place, and person Mood and affect: No depression, anxiety, or agitation      DATA REVIEWED:   Latest Reference Range & Units 06/12/22 08:55  Sodium 135 - 145 mmol/L 142  Potassium 3.5 - 5.1 mmol/L 3.9  Chloride 98 - 111 mmol/L 108  CO2 22 - 32 mmol/L 27  Glucose 70 - 99 mg/dL 86  BUN 8 - 23 mg/dL 17  Creatinine 0.61 - 1.24 mg/dL 1.22  Calcium 8.9 - 10.3 mg/dL 8.9  Anion gap 5 - 15  7  Alkaline Phosphatase 38 - 126 U/L 71  Albumin 3.5 - 5.0 g/dL 4.2  AST 15 - 41 U/L 30  ALT 0 - 44 U/L 35  Total Protein 6.5 - 8.1 g/dL 7.1  Total Bilirubin 0.3 - 1.2 mg/dL 0.3  GFR, Est Non African American >60 mL/min >60  WBC 4.0 - 10.5 K/uL 7.5  RBC 4.22 - 5.81 MIL/uL 4.75  Hemoglobin 13.0 - 17.0 g/dL 14.1  HCT 39.0 - 52.0 % 42.5  MCV 80.0 - 100.0 fL 89.5  MCH 26.0 - 34.0 pg 29.7  MCHC 30.0 - 36.0 g/dL 33.2  RDW 11.5 - 15.5 % 14.7  Platelets 150 - 400 K/uL 232  nRBC 0.0 - 0.2 % 0.0  Neutrophils % 42  Lymphocytes % 42  Monocytes Relative % 10  Eosinophil % 5  Basophil % 1  Immature Granulocytes % 0  NEUT# 1.7 - 7.7 K/uL 3.1  Lymphocyte # 0.7 - 4.0 K/uL 3.2  Monocyte # 0.1 - 1.0 K/uL 0.8  Eosinophils Absolute 0.0 - 0.5 K/uL 0.4  Basophils Absolute 0.0 - 0.1 K/uL 0.1  Abs Immature Granulocytes 0.00 - 0.07 K/uL 0.01  TSH 0.350 - 4.500 uIU/mL <1.010  Thyroxine (T4) 4.5 - 12.0 ug/dL 6.1      Brain MRI 04/26/2022 Stable or Smaller lesions: Stable postoperative changes from prior trans-sphenoidal debulking of the sellar mass. Interval decrease in size of the residual enhancing tissue along the right aspect of the sella and involving the pituitary infundibulum, now measuring up to 8.9 x 7.5 mm on sagittal image 6 series 23.   Other Brain findings: No acute infarct or hemorrhage. No hydrocephalus or extra-axial collection.   Vascular:  Enhancing atherosclerotic plaque along the right ICA cavernous segment without hemodynamically significant stenosis.   Skull and upper cervical spine: Normal marrow signal.   Sinuses/Orbits: Postoperative changes of  trans-sphenoidal approach to the sella. Paranasal sinuses, mastoid air cells, and middle ear cavities are well aerated. Orbits are unremarkable.   Other: None.   IMPRESSION: 1. Interval decrease in size of the residual enhancing tissue along the right aspect of the sella and involving the pituitary infundibulum, consistent with response to radiation. 2. No new lesions.  ASSESSMENT/PLAN/RECOMMENDATIONS:   Hx of Pituitary macroadenoma, S/P transphenoidal hypophysectomy 10/2021   -This was found on brain MRI 05/2021 - Pathology report consistent with renal cell carcinoma - Started on Keytruda through Oncology 9/5, 2023 - Continues to follow with Dr. Zada Finders  -He is s/p transsphenoidal hypophysectomy 10/2021, s/p SRS with repeat MRI 04/2022 showed decrease in size of the residual enhancing tissue along the right aspect of the sella  2.  Secondary adrenal insufficiency:  -Will switch hydrocortisone to prednisone for ease of taking -Sick day rules discussed with the patient   Medication Stop hydrocortisone Start prednisone 5 mg, 1.5 tablets daily   3. Secondary hypogonadism   -Testosterone has been improving  - The decision to start him on testosterone therapy  due to undetectable  testosterone and the benefit outweighed the risk -We again discussed the risk of thromboembolic events as well as as cardiovascular risk and my recommendation to keep his testosterone level at that lower limit of normal -Testosterone levels pending today  Continue androgel 1 pump to each shoulder    4.  Secondary hypothyroidism:   -Patient is clinically euthyroid -He recently had a normal total T4 through his oncologist office -No changes   Medication  Continue levothyroxine 88 mcg daily    Follow-up in 6 months      Signed electronically by: Mack Guise, MD  [facility]  [facility] [address] [place], [place] [address] Phone:  [phone] FAX: [phone]   CC: [name], [place] [address] [place] [address] Phone: [phone] Fax: [phone]   Return to Endocrinology clinic as below: Future Appointments  Date Time Provider [place]  06/22/2022 11:30 AM Adelynn Gipe, Melanie Crazier, MD [facility] None  07/03/2022  9:00 AM [facility] LAB [facility] None  07/03/2022  9:30 AM Curt Bears, MD [facility] None  07/03/2022 10:30 AM [facility] INFUSION [facility] None  07/24/2022 10:45 AM [facility] LAB [facility] None  07/24/2022 11:15 AM Curt Bears, MD [facility] None  07/24/2022 12:15 PM [facility] INFUSION [facility] None  07/26/2022  2:00 PM [facility]-315 MR 3 GI-315MRI GI[facility] [address]  07/30/2022  7:00 AM [facility] CONFERENCE [facility] None  08/01/2022  9:30 AM Bruning, Ashlyn, PA-C [facility] None  08/09/2022  8:15 AM [facility]  08/14/2022  9:30 AM [facility] LAB [facility] None  08/14/2022 10:00 AM Heilingoetter, Cassandra L, PA-C [facility] None  08/14/2022 11:15 AM [facility] INFUSION [facility] None  08/16/2022 11:00 AM Nahser, Wonda Cheng, MD [facility]

## 2022-06-26 ENCOUNTER — Other Ambulatory Visit: Payer: Self-pay | Admitting: Internal Medicine

## 2022-06-26 LAB — TESTOSTERONE, TOTAL, LC/MS/MS: Testosterone, Total, LC-MS-MS: 273 ng/dL (ref 250–1100)

## 2022-06-26 MED ORDER — TESTOSTERONE 20.25 MG/1.25GM (1.62%) TD GEL: 2.0000 | TRANSDERMAL | 5 refills | Status: DC

## 2022-07-03 ENCOUNTER — Encounter: Payer: Self-pay | Admitting: Medical Oncology

## 2022-07-03 ENCOUNTER — Inpatient Hospital Stay (HOSPITAL_BASED_OUTPATIENT_CLINIC_OR_DEPARTMENT_OTHER): Payer: 59 | Admitting: Internal Medicine

## 2022-07-03 ENCOUNTER — Inpatient Hospital Stay: Payer: 59 | Attending: Oncology

## 2022-07-03 ENCOUNTER — Other Ambulatory Visit: Payer: Self-pay

## 2022-07-03 ENCOUNTER — Inpatient Hospital Stay: Payer: 59

## 2022-07-03 VITALS — BP 114/71 | HR 63 | Resp 18

## 2022-07-03 DIAGNOSIS — Z79620 Long term (current) use of immunosuppressive biologic: Secondary | ICD-10-CM | POA: Insufficient documentation

## 2022-07-03 DIAGNOSIS — C7800 Secondary malignant neoplasm of unspecified lung: Secondary | ICD-10-CM | POA: Insufficient documentation

## 2022-07-03 DIAGNOSIS — Z5112 Encounter for antineoplastic immunotherapy: Secondary | ICD-10-CM | POA: Insufficient documentation

## 2022-07-03 DIAGNOSIS — C642 Malignant neoplasm of left kidney, except renal pelvis: Secondary | ICD-10-CM | POA: Insufficient documentation

## 2022-07-03 LAB — CMP (CANCER CENTER ONLY)
ALT: 33 U/L (ref 0–44)
AST: 27 U/L (ref 15–41)
Albumin: 4.3 g/dL (ref 3.5–5.0)
Alkaline Phosphatase: 72 U/L (ref 38–126)
Anion gap: 8 (ref 5–15)
BUN: 17 mg/dL (ref 8–23)
CO2: 25 mmol/L (ref 22–32)
Calcium: 9.1 mg/dL (ref 8.9–10.3)
Chloride: 108 mmol/L (ref 98–111)
Creatinine: 1.26 mg/dL — ABNORMAL HIGH (ref 0.61–1.24)
GFR, Estimated: >60 mL/min (ref >60)
Glucose, Bld: 95 mg/dL (ref 70–99)
Potassium: 4.1 mmol/L (ref 3.5–5.1)
Sodium: 141 mmol/L (ref 135–145)
Total Bilirubin: 0.4 mg/dL (ref 0.3–1.2)
Total Protein: 7.2 g/dL (ref 6.5–8.1)

## 2022-07-03 LAB — CBC WITH DIFFERENTIAL (CANCER CENTER ONLY)
Abs Immature Granulocytes: 0.01 10*3/uL (ref 0.00–0.07)
Basophils Absolute: 0 10*3/uL (ref 0.0–0.1)
Basophils Relative: 1 %
Eosinophils Absolute: 0.3 10*3/uL (ref 0.0–0.5)
Eosinophils Relative: 4 %
HCT: 44.5 % (ref 39.0–52.0)
Hemoglobin: 14.6 g/dL (ref 13.0–17.0)
Immature Granulocytes: 0 %
Lymphocytes Relative: 38 %
Lymphs Abs: 3.2 10*3/uL (ref 0.7–4.0)
MCH: 29.4 pg (ref 26.0–34.0)
MCHC: 32.8 g/dL (ref 30.0–36.0)
MCV: 89.7 fL (ref 80.0–100.0)
Monocytes Absolute: 0.9 10*3/uL (ref 0.1–1.0)
Monocytes Relative: 10 %
Neutro Abs: 3.8 10*3/uL (ref 1.7–7.7)
Neutrophils Relative %: 47 %
Platelet Count: 242 10*3/uL (ref 150–400)
RBC: 4.96 MIL/uL (ref 4.22–5.81)
RDW: 14.4 % (ref 11.5–15.5)
WBC Count: 8.2 10*3/uL (ref 4.0–10.5)
nRBC: 0 % (ref 0.0–0.2)

## 2022-07-03 LAB — TSH: TSH: <0.01 u[IU]/mL — ABNORMAL LOW (ref 0.350–4.500)

## 2022-07-03 MED ORDER — SODIUM CHLORIDE 0.9 % IV SOLN
200.0000 mg | Freq: Once | INTRAVENOUS | Status: AC
  Administered 2022-07-03: 200 mg via INTRAVENOUS
  Filled 2022-07-03: qty 8

## 2022-07-03 MED ORDER — SODIUM CHLORIDE 0.9 % IV SOLN: Freq: Once | INTRAVENOUS | Status: AC

## 2022-07-03 NOTE — Progress Notes (Signed)
[facility] [phone]   [phone]  OFFICE PROGRESS NOTE  [name], [name], PA 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    PRIOR THERAPY: 1) status post robotic assisted laparoscopic left radical nephrectomy by Dr. Lovena Neighbours on January 05, 2020.  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  The final pathological staging was T3aN0 grade 2 tumor.     2) Status post radiation therapy to the pulmonary nodules completed March 2023.  He received 50 Gray in 5 fractions.   3) Status post cryoablation to 2 lesions in the right kidney completed on Aug 23, 2021.   4) status post endoscopic transsphenoidal pituitary resection completed by Dr. Wilburn Cornelia on November 08, 2021.  The final pathology showed clear-cell renal cell carcinoma.   5) SRS treatment to pituitary bed completed in October 2023.  He received 5 fractions for a total of 25 Gray.  CURRENT THERAPY: Keytruda 200 Mg IV every 3 weeks started Aug 04, 2021 status post 10 cycles with axitinib 5 mg p.o. daily started February 09, 2022.  INTERVAL HISTORY: Marc [name] 67 y.o. male turns to the clinic today for follow-up visit.  The patient is feeling fine today with no concerning complaints.  He denied having any chest pain, shortness of breath, cough or hemoptysis.  He has no nausea, vomiting, diarrhea or constipation.  He intentionally lost around 30 pounds since his last visit.  He has no headache or visual changes.  He is here today for evaluation before starting cycle #11 of his treatment.   MEDICAL HISTORY: Past Medical History:  Diagnosis Date   Arthritis    Cancer ([place])    Kidney, Lung, Pituitary   Hyperlipidemia    Hypothyroidism      ALLERGIES:  has No Known Allergies.  MEDICATIONS:  Current Outpatient Medications  Medication Sig Dispense Refill   Ascorbic Acid (VITAMIN C PO) Take 1 tablet by mouth every evening.     aspirin EC 81 MG tablet Take 81 mg by mouth every evening. Swallow whole.     axitinib (INLYTA) 5 MG tablet Take 1 tablet (5 mg total) by mouth daily. 30 tablet 3   cholecalciferol (VITAMIN D3) 25 MCG (1000 UNIT) tablet Take 1,000 Units by mouth every evening.     Ferrous Sulfate (IRON SLOW RELEASE PO) Take 1 tablet by mouth every evening.     ibuprofen (ADVIL) 200 MG tablet Take 400-600 mg by mouth every 6 (six) hours as needed for moderate pain.     levothyroxine (SYNTHROID) 88 MCG tablet Take 1 tablet (88 mcg total) by mouth daily. 90 tablet 3   Multiple Vitamin (MULTI VITAMIN) TABS 1 tablet Orally Once a day for 30 day(s)     Multiple Vitamins-Minerals (MULTIVITAMIN WITH MINERALS) tablet Take 1 tablet by mouth every evening.     Omega-3 Fatty Acids (FISH OIL PO) Take 1 capsule by mouth every evening.     predniSONE (DELTASONE) 5 MG tablet Take 1.5 tablets (7.5 mg total) by mouth daily with breakfast. Pt may double up during sick days 150 tablet 3   prochlorperazine (COMPAZINE) 10 MG tablet Take 1 tablet (10 mg total) by mouth every 6 (six) hours as needed for nausea or vomiting. [place]  tablet 0   rosuvastatin (CRESTOR) 20 MG tablet Take 1 tablet (20 mg total) by mouth daily. 90 tablet 3   Testosterone (ANDROGEL) 20.25 MG/1.25GM (1.62%) GEL Place 2 Pump onto the skin as directed. 150 g 5   vitamin E 180 MG (400 UNITS) capsule Take 400 Units by mouth every evening.     No current facility-administered medications for this visit.    SURGICAL HISTORY:  Past Surgical History:  Procedure Laterality Date   CRANIOTOMY N/A 11/08/2021   Procedure: ENDOSCOPIC ENDONASAL RESECTION OF SELLAR MASS;  Surgeon: Judith Part, MD;  Location: [facility];  Service: Neurosurgery;  Laterality: N/A;  RM 20   IR RADIOLOGIST  EVAL & MGMT  07/14/2021   IR RADIOLOGIST EVAL & MGMT  07/27/2021   IR RADIOLOGIST EVAL & MGMT  01/30/2022   RADIOFREQUENCY ABLATION N/A 08/23/2021   Procedure: RENAL CRYO ABLATION;  Surgeon: Greggory Keen, MD;  [facility];  Service: Anesthesiology;  Laterality: N/A;   right shoulder rotator cuff repair Right    ROBOT ASSISTED LAPAROSCOPIC NEPHRECTOMY Left 01/05/2020   Procedure: XI ROBOTIC ASSISTED LAPAROSCOPIC RADICAL NEPHRECTOMY;  Surgeon: Ceasar Mons, MD;  [facility];  Service: Urology;  Laterality: Left;   TOTAL HIP ARTHROPLASTY Left 03/31/2020   Procedure: LEFT TOTAL HIP ARTHROPLASTY ANTERIOR APPROACH;  Surgeon: Mcarthur Rossetti, MD;  Location: [facility];  Service: Orthopedics;  Laterality: Left;   TRANSPHENOIDAL APPROACH EXPOSURE N/A 11/08/2021   Procedure: TRANSPHENOIDAL APPROACH EXPOSURE;  Surgeon: Jerrell Belfast, MD;  Location: [place] [facility];  Service: ENT;  Laterality: N/A;    REVIEW OF SYSTEMS:  A comprehensive review of systems was negative.   PHYSICAL EXAMINATION: General appearance: alert, cooperative, and no distress Head: Normocephalic, without obvious abnormality, atraumatic Neck: no adenopathy, no JVD, supple, symmetrical, trachea midline, and thyroid not enlarged, symmetric, no tenderness/mass/nodules Lymph nodes: Cervical, supraclavicular, and axillary nodes normal. Resp: clear to auscultation bilaterally Back: symmetric, no curvature. ROM normal. No CVA tenderness. Cardio: regular rate and rhythm, S1, S2 normal, no murmur, click, rub or gallop GI: soft, non-tender; bowel sounds normal; no masses,  no organomegaly Extremities: extremities normal, atraumatic, no cyanosis or edema  ECOG PERFORMANCE STATUS: 1 - Symptomatic but completely ambulatory  Blood pressure 122/81, pulse 82, temperature 97.7 F (36.5 C), temperature source Temporal, resp. rate 18, height 5\' 9"  (1.753 m), weight 238 lb 11.2 oz (108.3 kg), SpO2 100 %.  LABORATORY DATA: Lab  Results  Component Value Date   WBC 8.2 07/03/2022   HGB 14.6 07/03/2022   HCT 44.5 07/03/2022   MCV 89.7 07/03/2022   PLT 242 07/03/2022      Chemistry      Component Value Date/Time   NA 142 06/12/2022 0855   K 3.9 06/12/2022 0855   CL 108 06/12/2022 0855   CO2 27 06/12/2022 0855   BUN 17 06/12/2022 0855   CREATININE 1.22 06/12/2022 0855      Component Value Date/Time   CALCIUM 8.9 06/12/2022 0855   ALKPHOS 71 06/12/2022 0855   AST 30 06/12/2022 0855   ALT 35 06/12/2022 0855   BILITOT 0.3 06/12/2022 0855       RADIOGRAPHIC STUDIES: No results found.  ASSESSMENT AND PLAN: This is a very pleasant 67 years old white male diagnosed with stage IV clear-cell renal cell carcinoma with pulmonary involvement as well as pituitary metastasis in 2023.  The patient was initially diagnosed as stage III and October 2021 status post robotic assisted laparoscopic left radical nephrectomy  followed by radiation therapy to the pulmonary nodules in March 2023 followed by cryoablation to 2 lesions in the right kidney completed in May 2023.  He is also status post endoscopic transsphenoidal pituitary resection on November 08, 2021.  He is also status post SRS to the pituitary bed in October 2023. The patient is currently undergoing treatment with Keytruda 200 Mg IV every 3 weeks status post 10 cycles.  This was started on Aug 04, 2021.  He is also on axitinib 5 mg p.o. daily since November 2023. The patient has been tolerating this treatment well with no concerning adverse effects. I recommended for him to proceed with cycle #11 today as planned. I will see him back for follow-up visit in 3 weeks with repeat CT scan of the chest, abdomen and pelvis for restaging of his disease. He was advised to call immediately if he has any other concerning symptoms in the interval. The patient voices understanding of current disease status and treatment options and is in agreement with the current care plan.  All  questions were answered. The patient knows to call the clinic with any problems, questions or concerns. We can certainly see the patient much sooner if necessary.  The total time spent in the appointment was 20 minutes.  Disclaimer: This note was dictated with voice recognition software. Similar sounding words can inadvertently be transcribed and may not be corrected upon review.

## 2022-07-03 NOTE — Progress Notes (Signed)
Patient seen by Dr. Mohamed  Vitals are within treatment parameters.  Labs reviewed: and are within treatment parameters.  Per physician team, patient is ready for treatment and there are NO modifications to the treatment plan.  

## 2022-07-03 NOTE — Patient Instructions (Signed)
[facility]  Discharge Instructions: Thank you for choosing [facility] to provide your oncology and hematology care.   If you have a lab appointment with the Cancer Center, please go directly to the Cancer Center and check in at the registration area.   Wear comfortable clothing and clothing appropriate for easy access to any Portacath or PICC line.   We strive to give you quality time with your provider. You may need to reschedule your appointment if you arrive late (15 or more minutes).  Arriving late affects you and other patients whose appointments are after yours.  Also, if you miss three or more appointments without notifying the office, you may be dismissed from the clinic at the provider's discretion.      For prescription refill requests, have your pharmacy contact our office and allow 72 hours for refills to be completed.    Today you received the following chemotherapy and/or immunotherapy agents: Keytruda      To help prevent nausea and vomiting after your treatment, we encourage you to take your nausea medication as directed.  BELOW ARE SYMPTOMS THAT SHOULD BE REPORTED IMMEDIATELY: *FEVER GREATER THAN 100.4 F (38 C) OR HIGHER *CHILLS OR SWEATING *NAUSEA AND VOMITING THAT IS NOT CONTROLLED WITH YOUR NAUSEA MEDICATION *UNUSUAL SHORTNESS OF BREATH *UNUSUAL BRUISING OR BLEEDING *URINARY PROBLEMS (pain or burning when urinating, or frequent urination) *BOWEL PROBLEMS (unusual diarrhea, constipation, pain near the anus) TENDERNESS IN MOUTH AND THROAT WITH OR WITHOUT PRESENCE OF ULCERS (sore throat, sores in mouth, or a toothache) UNUSUAL RASH, SWELLING OR PAIN  UNUSUAL VAGINAL DISCHARGE OR ITCHING   Items with * indicate a potential emergency and should be followed up as soon as possible or go to the Emergency Department if any problems should occur.  Please show the CHEMOTHERAPY ALERT CARD or IMMUNOTHERAPY ALERT CARD at  check-in to the Emergency Department and triage nurse.  Should you have questions after your visit or need to cancel or reschedule your appointment, please contact [facility]  Dept: [phone]  and follow the prompts.  Office hours are 8:00 a.m. to 4:30 p.m. Monday - Friday. Please note that voicemails left after 4:00 p.m. may not be returned until the following business day.  We are closed weekends and major holidays. You have access to a nurse at all times for urgent questions. Please call the main number to the clinic Dept: [phone] and follow the prompts.   For any non-urgent questions, you may also contact your provider using MyChart. We now offer e-Visits for anyone 18 and older to request care online for non-urgent symptoms. For details visit [email or web address].   Also download the MyChart app! Go to the app store, search "MyChart", open the app, select [facility], and log in with your MyChart username and password.   

## 2022-07-05 LAB — T4: T4, Total: 7.5 ug/dL (ref 4.5–12.0)

## 2022-07-06 ENCOUNTER — Other Ambulatory Visit: Payer: Self-pay | Admitting: Interventional Radiology

## 2022-07-06 DIAGNOSIS — N2889 Other specified disorders of kidney and ureter: Secondary | ICD-10-CM

## 2022-07-19 ENCOUNTER — Ambulatory Visit (HOSPITAL_BASED_OUTPATIENT_CLINIC_OR_DEPARTMENT_OTHER)
Admission: RE | Admit: 2022-07-19 | Discharge: 2022-07-19 | Disposition: A | Discharge status: Home / Self Care | Payer: 59 | Source: Ambulatory Visit | Attending: Internal Medicine | Admitting: Internal Medicine

## 2022-07-19 DIAGNOSIS — C642 Malignant neoplasm of left kidney, except renal pelvis: Secondary | ICD-10-CM | POA: Diagnosis not present

## 2022-07-24 ENCOUNTER — Encounter: Payer: Self-pay | Admitting: Internal Medicine

## 2022-07-24 ENCOUNTER — Inpatient Hospital Stay (HOSPITAL_BASED_OUTPATIENT_CLINIC_OR_DEPARTMENT_OTHER): Payer: 59 | Admitting: Internal Medicine

## 2022-07-24 ENCOUNTER — Encounter: Payer: Self-pay | Admitting: Medical Oncology

## 2022-07-24 ENCOUNTER — Inpatient Hospital Stay: Payer: 59

## 2022-07-24 VITALS — BP 127/85 | HR 58

## 2022-07-24 DIAGNOSIS — C642 Malignant neoplasm of left kidney, except renal pelvis: Secondary | ICD-10-CM | POA: Diagnosis not present

## 2022-07-24 DIAGNOSIS — Z5112 Encounter for antineoplastic immunotherapy: Secondary | ICD-10-CM | POA: Diagnosis not present

## 2022-07-24 LAB — CMP (CANCER CENTER ONLY)
ALT: 40 U/L (ref 0–44)
AST: 27 U/L (ref 15–41)
Albumin: 4.2 g/dL (ref 3.5–5.0)
Alkaline Phosphatase: 64 U/L (ref 38–126)
Anion gap: 6 (ref 5–15)
BUN: 16 mg/dL (ref 8–23)
CO2: 29 mmol/L (ref 22–32)
Calcium: 9.1 mg/dL (ref 8.9–10.3)
Chloride: 107 mmol/L (ref 98–111)
Creatinine: 1.29 mg/dL — ABNORMAL HIGH (ref 0.61–1.24)
GFR, Estimated: >60 mL/min (ref >60)
Glucose, Bld: 95 mg/dL (ref 70–99)
Potassium: 4.2 mmol/L (ref 3.5–5.1)
Sodium: 142 mmol/L (ref 135–145)
Total Bilirubin: 0.4 mg/dL (ref 0.3–1.2)
Total Protein: 6.9 g/dL (ref 6.5–8.1)

## 2022-07-24 LAB — CBC WITH DIFFERENTIAL (CANCER CENTER ONLY)
Abs Immature Granulocytes: 0.03 10*3/uL (ref 0.00–0.07)
Basophils Absolute: 0.1 10*3/uL (ref 0.0–0.1)
Basophils Relative: 1 %
Eosinophils Absolute: 0.2 10*3/uL (ref 0.0–0.5)
Eosinophils Relative: 2 %
HCT: 43.7 % (ref 39.0–52.0)
Hemoglobin: 14.4 g/dL (ref 13.0–17.0)
Immature Granulocytes: 0 %
Lymphocytes Relative: 37 %
Lymphs Abs: 3.2 10*3/uL (ref 0.7–4.0)
MCH: 29.8 pg (ref 26.0–34.0)
MCHC: 33 g/dL (ref 30.0–36.0)
MCV: 90.5 fL (ref 80.0–100.0)
Monocytes Absolute: 0.8 10*3/uL (ref 0.1–1.0)
Monocytes Relative: 9 %
Neutro Abs: 4.3 10*3/uL (ref 1.7–7.7)
Neutrophils Relative %: 51 %
Platelet Count: 230 10*3/uL (ref 150–400)
RBC: 4.83 MIL/uL (ref 4.22–5.81)
RDW: 15 % (ref 11.5–15.5)
WBC Count: 8.6 10*3/uL (ref 4.0–10.5)
nRBC: 0 % (ref 0.0–0.2)

## 2022-07-24 LAB — TSH: TSH: <0.01 u[IU]/mL — ABNORMAL LOW (ref 0.350–4.500)

## 2022-07-24 MED ORDER — SODIUM CHLORIDE 0.9 % IV SOLN: Freq: Once | INTRAVENOUS | Status: AC

## 2022-07-24 MED ORDER — SODIUM CHLORIDE 0.9 % IV SOLN
200.0000 mg | Freq: Once | INTRAVENOUS | Status: AC
  Administered 2022-07-24: 200 mg via INTRAVENOUS
  Filled 2022-07-24: qty 200

## 2022-07-24 NOTE — Patient Instructions (Signed)
[facility]  Discharge Instructions: Thank you for choosing [facility] to provide your oncology and hematology care.   If you have a lab appointment with the Cancer Center, please go directly to the Cancer Center and check in at the registration area.   Wear comfortable clothing and clothing appropriate for easy access to any Portacath or PICC line.   We strive to give you quality time with your provider. You may need to reschedule your appointment if you arrive late (15 or more minutes).  Arriving late affects you and other patients whose appointments are after yours.  Also, if you miss three or more appointments without notifying the office, you may be dismissed from the clinic at the provider's discretion.      For prescription refill requests, have your pharmacy contact our office and allow 72 hours for refills to be completed.    Today you received the following chemotherapy and/or immunotherapy agents: Keytruda      To help prevent nausea and vomiting after your treatment, we encourage you to take your nausea medication as directed.  BELOW ARE SYMPTOMS THAT SHOULD BE REPORTED IMMEDIATELY: *FEVER GREATER THAN 100.4 F (38 C) OR HIGHER *CHILLS OR SWEATING *NAUSEA AND VOMITING THAT IS NOT CONTROLLED WITH YOUR NAUSEA MEDICATION *UNUSUAL SHORTNESS OF BREATH *UNUSUAL BRUISING OR BLEEDING *URINARY PROBLEMS (pain or burning when urinating, or frequent urination) *BOWEL PROBLEMS (unusual diarrhea, constipation, pain near the anus) TENDERNESS IN MOUTH AND THROAT WITH OR WITHOUT PRESENCE OF ULCERS (sore throat, sores in mouth, or a toothache) UNUSUAL RASH, SWELLING OR PAIN  UNUSUAL VAGINAL DISCHARGE OR ITCHING   Items with * indicate a potential emergency and should be followed up as soon as possible or go to the Emergency Department if any problems should occur.  Please show the CHEMOTHERAPY ALERT CARD or IMMUNOTHERAPY ALERT CARD at  check-in to the Emergency Department and triage nurse.  Should you have questions after your visit or need to cancel or reschedule your appointment, please contact [facility]  Dept: [phone]  and follow the prompts.  Office hours are 8:00 a.m. to 4:30 p.m. Monday - Friday. Please note that voicemails left after 4:00 p.m. may not be returned until the following business day.  We are closed weekends and major holidays. You have access to a nurse at all times for urgent questions. Please call the main number to the clinic Dept: [phone] and follow the prompts.   For any non-urgent questions, you may also contact your provider using MyChart. We now offer e-Visits for anyone 18 and older to request care online for non-urgent symptoms. For details visit [email or web address].   Also download the MyChart app! Go to the app store, search "MyChart", open the app, select [facility], and log in with your MyChart username and password.   

## 2022-07-24 NOTE — Progress Notes (Signed)
[facility] [phone]   [phone]  OFFICE PROGRESS NOTE  [name], [name], [place] 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    PRIOR THERAPY: 1) status post robotic assisted laparoscopic left radical nephrectomy by Dr. Liliane Shi on January 05, 2020.  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  The final pathological staging was T3aN0 grade 2 tumor.     2) Status post radiation therapy to the pulmonary nodules completed March 2023.  He received 50 Gray in 5 fractions.   3) Status post cryoablation to 2 lesions in the right kidney completed on Aug 23, 2021.   4) status post endoscopic transsphenoidal pituitary resection completed by Dr. Annalee Genta on November 08, 2021.  The final pathology showed clear-cell renal cell carcinoma.   5) SRS treatment to pituitary bed completed in October 2023.  He received 5 fractions for a total of 25 Gray.  CURRENT THERAPY: Keytruda 200 Mg IV every 3 weeks started Aug 04, 2021 status post 11 cycles with axitinib 5 mg p.o. daily started February 09, 2022.  INTERVAL HISTORY: EVREN SHANKLAND 67 y.o. male is to the clinic today for follow-up visit.  The patient is feeling fine today with no concerning complaints.  He denied having any current chest pain, shortness of breath, cough or hemoptysis.  He has no nausea, vomiting, but occasional diarrhea and no constipation.  He has no recent weight loss or night sweats.  He has no headache or visual changes.  He has been tolerating his treatment with Keytruda and axitinib fairly well.  The patient had repeat CT scan of the chest, abdomen and pelvis performed recently and is here for evaluation and discussion of his scan  results.   MEDICAL HISTORY: Past Medical History:  Diagnosis Date   Arthritis    Cancer    Kidney, Lung, Pituitary   Hyperlipidemia    Hypothyroidism     ALLERGIES:  has No Known Allergies.  MEDICATIONS:  Current Outpatient Medications  Medication Sig Dispense Refill   Ascorbic Acid (VITAMIN C PO) Take 1 tablet by mouth every evening.     aspirin EC 81 MG tablet Take 81 mg by mouth every evening. Swallow whole.     axitinib (INLYTA) 5 MG tablet Take 1 tablet (5 mg total) by mouth daily. 30 tablet 3   cholecalciferol (VITAMIN D3) 25 MCG (1000 UNIT) tablet Take 1,000 Units by mouth every evening.     Ferrous Sulfate (IRON SLOW RELEASE PO) Take 1 tablet by mouth every evening.     ibuprofen (ADVIL) 200 MG tablet Take 400-600 mg by mouth every 6 (six) hours as needed for moderate pain.     levothyroxine (SYNTHROID) 88 MCG tablet Take 1 tablet (88 mcg total) by mouth daily. 90 tablet 3   Multiple Vitamin (MULTI VITAMIN) TABS 1 tablet Orally Once a day for 30 day(s)     Multiple Vitamins-Minerals (MULTIVITAMIN WITH MINERALS) tablet Take 1 tablet by mouth every evening.     Omega-3 Fatty Acids (FISH OIL PO) Take 1 capsule by mouth every evening.     predniSONE (DELTASONE) 5 MG tablet Take 1.5 tablets (7.5 mg total) by mouth daily with breakfast. Pt may double up during sick days 150 tablet  3   prochlorperazine (COMPAZINE) 10 MG tablet Take 1 tablet (10 mg total) by mouth every 6 (six) hours as needed for nausea or vomiting. 30 tablet 0   rosuvastatin (CRESTOR) 20 MG tablet Take 1 tablet (20 mg total) by mouth daily. 90 tablet 3   Testosterone (ANDROGEL) 20.25 MG/1.25GM (1.62%) GEL Place 2 Pump onto the skin as directed. 150 g 5   vitamin E 180 MG (400 UNITS) capsule Take 400 Units by mouth every evening.     No current facility-administered medications for this visit.    SURGICAL HISTORY:  Past Surgical History:  Procedure Laterality Date   CRANIOTOMY N/A 11/08/2021   Procedure:  ENDOSCOPIC ENDONASAL RESECTION OF SELLAR MASS;  Surgeon: Jadene Pierini, MD;  Location: [facility] OR;  Service: Neurosurgery;  Laterality: N/A;  RM 20   IR RADIOLOGIST EVAL & MGMT  07/14/2021   IR RADIOLOGIST EVAL & MGMT  07/27/2021   IR RADIOLOGIST EVAL & MGMT  01/30/2022   RADIOFREQUENCY ABLATION N/A 08/23/2021   Procedure: RENAL CRYO ABLATION;  Surgeon: Berdine Dance, MD;  Location: [facility] ORS;  Service: Anesthesiology;  Laterality: N/A;   right shoulder rotator cuff repair Right    ROBOT ASSISTED LAPAROSCOPIC NEPHRECTOMY Left 01/05/2020   Procedure: XI ROBOTIC ASSISTED LAPAROSCOPIC RADICAL NEPHRECTOMY;  Surgeon: Rene Paci, MD;  Location: [facility] ORS;  Service: Urology;  Laterality: Left;   TOTAL HIP ARTHROPLASTY Left 03/31/2020   Procedure: LEFT TOTAL HIP ARTHROPLASTY ANTERIOR APPROACH;  Surgeon: Kathryne Hitch, MD;  Location: [facility] OR;  Service: Orthopedics;  Laterality: Left;   TRANSPHENOIDAL APPROACH EXPOSURE N/A 11/08/2021   Procedure: TRANSPHENOIDAL APPROACH EXPOSURE;  Surgeon: Osborn Coho, MD;  Location: [facility] OR;  Service: ENT;  Laterality: N/A;    REVIEW OF SYSTEMS:  Constitutional: positive for fatigue Eyes: negative Ears, nose, mouth, throat, and face: negative Respiratory: negative Cardiovascular: negative Gastrointestinal: positive for diarrhea Genitourinary:negative Integument/breast: negative Hematologic/lymphatic: negative Musculoskeletal:negative Neurological: negative Behavioral/Psych: negative Endocrine: negative Allergic/Immunologic: negative   PHYSICAL EXAMINATION: General appearance: alert, cooperative, and no distress Head: Normocephalic, without obvious abnormality, atraumatic Neck: no adenopathy, no JVD, supple, symmetrical, trachea midline, and thyroid not enlarged, symmetric, no tenderness/mass/nodules Lymph nodes: Cervical, supraclavicular, and axillary nodes normal. Resp: clear to auscultation bilaterally Back: symmetric, no curvature. ROM  normal. No CVA tenderness. Cardio: regular rate and rhythm, S1, S2 normal, no murmur, click, rub or gallop GI: soft, non-tender; bowel sounds normal; no masses,  no organomegaly Extremities: extremities normal, atraumatic, no cyanosis or edema Neurologic: Alert and oriented X 3, normal strength and tone. Normal symmetric reflexes. Normal coordination and gait  ECOG PERFORMANCE STATUS: 1 - Symptomatic but completely ambulatory  Blood pressure 123/80, pulse 65, temperature 97.7 F (36.5 C), temperature source Oral, resp. rate 14, weight 236 lb 9.6 oz (107.3 kg), SpO2 98 %.  LABORATORY DATA: Lab Results  Component Value Date   WBC 8.6 07/24/2022   HGB 14.4 07/24/2022   HCT 43.7 07/24/2022   MCV 90.5 07/24/2022   PLT 230 07/24/2022      Chemistry      Component Value Date/Time   NA 142 07/24/2022 1040   K 4.2 07/24/2022 1040   CL 107 07/24/2022 1040   CO2 29 07/24/2022 1040   BUN 16 07/24/2022 1040   CREATININE 1.29 (H) 07/24/2022 1040      Component Value Date/Time   CALCIUM 9.1 07/24/2022 1040   ALKPHOS 64 07/24/2022 1040   AST 27 07/24/2022 1040   ALT 40 07/24/2022 1040  BILITOT 0.4 07/24/2022 1040       RADIOGRAPHIC STUDIES: CT CHEST ABDOMEN PELVIS WO CONTRAST  Result Date: 07/22/2022 CLINICAL DATA:  Stage IV clear cell renal cell carcinoma with pulmonary metastasis and pituitary metastasis diagnosed last year. History of left renal cell carcinoma status post nephrectomy and adrenalectomy. Right renal cell carcinoma status post cryoablation and immunotherapy. * Tracking Code: BO * EXAM: CT CHEST, ABDOMEN AND PELVIS WITHOUT CONTRAST TECHNIQUE: Multidetector CT imaging of the chest, abdomen and pelvis was performed following the standard protocol without IV contrast. RADIATION DOSE REDUCTION: This exam was performed according to the departmental dose-optimization program which includes automated exposure control, adjustment of the mA and/or kV according to patient size  and/or use of iterative reconstruction technique. COMPARISON:  04/03/2022 FINDINGS: CT CHEST FINDINGS Cardiovascular: Aortic atherosclerosis. Normal heart size, without pericardial effusion. Three vessel coronary artery calcification. Mediastinum/Nodes: No mediastinal or hilar adenopathy, given limitations of unenhanced CT. Lungs/Pleura: No pleural fluid. The pleural-based inferior right hemithorax mass measures 3.4 x 2.3 cm on 48/2 versus 3.9 x 2.2 cm on the prior exam. Maximal dimension on coronal image 47 of 3.5 cm today versus 4.1 cm on the prior Posttreatment volume loss and amorphous soft tissue within the right minor fissure and anterior right lower lobe are similar, including on 77/4 and 106/4. Tiny calcified bilateral pulmonary nodules. The tiny, 5 mm nodule on the right juxta pleural lower lobe on the prior exam is not identified today. Musculoskeletal: No acute osseous abnormality. CT ABDOMEN PELVIS FINDINGS Hepatobiliary: Normal noncontrast appearance of the liver. Small dependent gallstones. No biliary duct dilatation. Pancreas: Normal, without mass or ductal dilatation. Spleen: Normal in size, without focal abnormality. Adrenals/Urinary Tract: Left nephrectomy and adrenalectomy without local recurrence. Normal right adrenal gland. Ablation site in the posteromedial upper pole right kidney is suboptimally evaluated on this noncontrast exam. There is also an interpolar right renal subtle hypoattenuating 1.2 cm lesion on 66/2 which is incompletely characterized. No right renal calculi or hydronephrosis. No bladder calculi. Stomach/Bowel: Normal stomach, without wall thickening. Scattered colonic diverticula. Normal terminal ileum and appendix. Normal small bowel. Vascular/Lymphatic: Aortic atherosclerosis. Degraded evaluation of the pelvis, secondary to left hip arthroplasty. No abdominal and no gross pelvic adenopathy. Reproductive: Prostate poorly evaluated. Other: No significant free fluid.  No free  intraperitoneal air. Musculoskeletal: Left hip arthroplasty.  Lumbosacral spondylosis. IMPRESSION: 1. Mild response to therapy of dominant pleural-based right inferior hemithorax mass. 2. No new or progressive disease. 3. Decreased sensitivity exam secondary to lack of IV contrast. 4. The pancreatic neck cystic lesion detailed on the prior exam is not apparent noncontrast CT. 5. Cholelithiasis 6. Coronary artery atherosclerosis. Aortic Atherosclerosis (ICD10-I70.0). Electronically Signed   By: Jeronimo Greaves M.D.   On: 07/22/2022 11:44    ASSESSMENT AND PLAN: This is a very pleasant 67 years old white male diagnosed with stage IV clear-cell renal cell carcinoma with pulmonary involvement as well as pituitary metastasis in 2023.  The patient was initially diagnosed as stage III and October 2021 status post robotic assisted laparoscopic left radical nephrectomy followed by radiation therapy to the pulmonary nodules in March 2023 followed by cryoablation to 2 lesions in the right kidney completed in May 2023.  He is also status post endoscopic transsphenoidal pituitary resection on November 08, 2021.  He is also status post SRS to the pituitary bed in October 2023. The patient is currently undergoing treatment with Keytruda 200 Mg IV every 3 weeks status post 11  cycles.  This was  started on Aug 04, 2021.  He is also on axitinib 5 mg p.o. daily since November 2023. The patient has been tolerating this treatment well with no concerning adverse effect except for mild fatigue and occasional diarrhea. He had repeat CT scan of the chest, abdomen and pelvis performed recently.  I personally and independently reviewed the scan and discussed the result with the patient today.  His scan showed mild improvement of the dominant pleural-based right inferior hemithorax mass with no new or progressive disease. I recommended for the patient to continue his current treatment and he will proceed with cycle #12 today.  We had a  discussion about the duration of treatment and I explained to the patient that and renal cell carcinoma the treatment is continuous until disease progression or unacceptable toxicity but there was a consensus among many of the oncologist to stop treatment in around 2 years if the patient has no concerning findings or significant disease burden. The patient is in agreement with the current plan and he has no issues with continuing the treatment. I will see him back for follow-up visit in 3 weeks for evaluation before the next cycle of his treatment. He was advised to call immediately if he has any concerning symptoms in the interval. The patient voices understanding of current disease status and treatment options and is in agreement with the current care plan.  All questions were answered. The patient knows to call the clinic with any problems, questions or concerns. We can certainly see the patient much sooner if necessary.  The total time spent in the appointment was 30 minutes.  Disclaimer: This note was dictated with voice recognition software. Similar sounding words can inadvertently be transcribed and may not be corrected upon review.

## 2022-07-24 NOTE — Progress Notes (Signed)
Patient seen by Dr. Mohamed  Vitals are within treatment parameters.  Labs reviewed: and are within treatment parameters.  Per physician team, patient is ready for treatment and there are NO modifications to the treatment plan.  

## 2022-07-25 LAB — T4: T4, Total: 5.5 ug/dL (ref 4.5–12.0)

## 2022-07-26 ENCOUNTER — Ambulatory Visit
Admission: RE | Admit: 2022-07-26 | Discharge: 2022-07-26 | Disposition: A | Discharge status: Home / Self Care | Payer: 59 | Source: Ambulatory Visit | Attending: Radiation Oncology | Admitting: Radiation Oncology

## 2022-07-26 DIAGNOSIS — C7931 Secondary malignant neoplasm of brain: Secondary | ICD-10-CM

## 2022-07-26 MED ORDER — GADOPICLENOL 0.5 MMOL/ML IV SOLN
10.0000 mL | Freq: Once | INTRAVENOUS | Status: AC | PRN
  Administered 2022-07-26: 10 mL via INTRAVENOUS

## 2022-07-27 ENCOUNTER — Telehealth: Payer: Self-pay | Admitting: Internal Medicine

## 2022-07-27 NOTE — Telephone Encounter (Signed)
Called patient regarding upcoming May/June appointments, patient is notified.  

## 2022-07-30 ENCOUNTER — Inpatient Hospital Stay: Payer: 59

## 2022-07-31 ENCOUNTER — Ambulatory Visit
Admission: RE | Admit: 2022-07-31 | Discharge: 2022-07-31 | Disposition: A | Discharge status: Home / Self Care | Payer: 59 | Source: Ambulatory Visit | Attending: Interventional Radiology | Admitting: Interventional Radiology

## 2022-07-31 DIAGNOSIS — N2889 Other specified disorders of kidney and ureter: Secondary | ICD-10-CM

## 2022-07-31 NOTE — Progress Notes (Signed)
Patient ID: Marc [name], male   DOB: [birth year], 67 y.o.   MRN: [id number]       Chief Complaint:   Metastatic renal cell carcinoma  Referring Physician(s): Dr. Shirline Frees  History of Present Illness: Marc [name] is a 67 y.o. male With stage IV metastatic renal cell carcinoma.  In review, he is status post left nephrectomy and adrenalectomy for a large left renal cell carcinoma in October 2021.  Surveillance imaging confirmed metastatic disease with 2 enlarging pulmonary nodule status post SBRT.  He also underwent surgery and radiation for pituitary metastatic lesion.  Surveillance imaging confirmed 2 suspicious enlarging right renal cortical masses.  These were successfully treated with image guided cryoablation almost 1 year ago on 07/24/2021.  He continues to undergo chemotherapy and immunotherapy with Dr. Arbutus Ped following Dr. Alver Fisher departure from [facility].  Overall he is feeling well.  No flank or abdominal pain.  No recent illness or fevers.  Stable weight.  Interval surveillance CT is without contrast.  Within the limits of noncontrast imaging.  Overall stable ablation changes in the right kidney upper pole.  No signs of progressive disease.  Stable metastatic disease as detailed in the CT report.  Past Medical History:  Diagnosis Date   Arthritis    Cancer (HCC)    Kidney, Lung, Pituitary   Hyperlipidemia    Hypothyroidism     Past Surgical History:  Procedure Laterality Date   CRANIOTOMY N/A 11/08/2021   Procedure: ENDOSCOPIC ENDONASAL RESECTION OF SELLAR MASS;  Surgeon: Jadene Pierini, MD;  Location: [facility] OR;  Service: Neurosurgery;  Laterality: N/A;  RM 20   IR RADIOLOGIST EVAL & MGMT  07/14/2021   IR RADIOLOGIST EVAL & MGMT  07/27/2021   IR RADIOLOGIST EVAL & MGMT  01/30/2022   RADIOFREQUENCY ABLATION N/A 08/23/2021   Procedure: RENAL CRYO ABLATION;  Surgeon: Berdine Dance, MD;  Location: [facility] ORS;  Service: Anesthesiology;  Laterality: N/A;   right shoulder  rotator cuff repair Right    ROBOT ASSISTED LAPAROSCOPIC NEPHRECTOMY Left 01/05/2020   Procedure: XI ROBOTIC ASSISTED LAPAROSCOPIC RADICAL NEPHRECTOMY;  Surgeon: Rene Paci, MD;  Location: [facility] ORS;  Service: Urology;  Laterality: Left;   TOTAL HIP ARTHROPLASTY Left 03/31/2020   Procedure: LEFT TOTAL HIP ARTHROPLASTY ANTERIOR APPROACH;  Surgeon: Kathryne Hitch, MD;  Location: [facility] OR;  Service: Orthopedics;  Laterality: Left;   TRANSPHENOIDAL APPROACH EXPOSURE N/A 11/08/2021   Procedure: TRANSPHENOIDAL APPROACH EXPOSURE;  Surgeon: Osborn Coho, MD;  Location: [facility] OR;  Service: ENT;  Laterality: N/A;    Allergies: Patient has no known allergies.  Medications: Prior to Admission medications   Medication Sig Start Date End Date Taking? Authorizing Provider  Ascorbic Acid (VITAMIN C PO) Take 1 tablet by mouth every evening.    [provider]  aspirin EC 81 MG tablet Take 81 mg by mouth every evening. Swallow whole.    [provider]  axitinib (INLYTA) 5 MG tablet Take 1 tablet (5 mg total) by mouth daily. 06/04/22   Si Gaul, MD  cholecalciferol (VITAMIN D3) 25 MCG (1000 UNIT) tablet Take 1,000 Units by mouth every evening.    [provider]  Ferrous Sulfate (IRON SLOW RELEASE PO) Take 1 tablet by mouth every evening.    [provider]  ibuprofen (ADVIL) 200 MG tablet Take 400-600 mg by mouth every 6 (six) hours as needed for moderate pain.    [provider]  levothyroxine (SYNTHROID) 88 MCG tablet Take 1 tablet (88  mcg total) by mouth daily. 06/22/22   Shamleffer, Konrad Dolores, MD  Multiple Vitamin (MULTI VITAMIN) TABS 1 tablet Orally Once a day for 30 day(s)    [provider]  Multiple Vitamins-Minerals (MULTIVITAMIN WITH MINERALS) tablet Take 1 tablet by mouth every evening.    [provider]  Omega-3 Fatty Acids (FISH OIL PO) Take 1 capsule by mouth every evening.    [provider]   predniSONE (DELTASONE) 5 MG tablet Take 1.5 tablets (7.5 mg total) by mouth daily with breakfast. Pt may double up during sick days 06/22/22   Shamleffer, Konrad Dolores, MD  prochlorperazine (COMPAZINE) 10 MG tablet Take 1 tablet (10 mg total) by mouth every 6 (six) hours as needed for nausea or vomiting. 08/31/21   Benjiman Core, MD  rosuvastatin (CRESTOR) 20 MG tablet Take 1 tablet (20 mg total) by mouth daily. 05/16/22   Nahser, Deloris Ping, MD  Testosterone (ANDROGEL) 20.25 MG/1.25GM (1.62%) GEL Place 2 Pump onto the skin as directed. 06/26/22   Shamleffer, Konrad Dolores, MD  vitamin E 180 MG (400 UNITS) capsule Take 400 Units by mouth every evening.    [provider]     No family history on file.  Social History   Socioeconomic History   Marital status: Single    Spouse name: Not on file   Number of children: 2   Years of education: Not on file   Highest education level: Not on file  Occupational History   Not on file  Tobacco Use   Smoking status: Former    Years: 30    Types: Cigarettes    Quit date: 04/03/2015    Years since quitting: 7.3   Smokeless tobacco: Never  Vaping Use   Vaping Use: Former   Quit date: 04/16/2021   Substances: Nicotine, Flavoring  Substance and Sexual Activity   Alcohol use: Yes    Comment: occas    Drug use: No   Sexual activity: Not on file  Other Topics Concern   Not on file  Social History Narrative   Not on file   Social Determinants of Health   Financial Resource Strain: Not on file  Food Insecurity: No Food Insecurity (05/01/2022)   Hunger Vital Sign    Worried About Running Out of Food in the Last Year: Never true    Ran Out of Food in the Last Year: Never true  Transportation Needs: No Transportation Needs (05/01/2022)   PRAPARE - Administrator, Civil Service (Medical): No    Lack of Transportation (Non-Medical): No  Physical Activity: Not on file  Stress: Not on file  Social Connections: Not on file      Review of Systems  Review of Systems: A 12 point ROS discussed and pertinent positives are indicated in the HPI above.  All other systems are negative.    Physical Exam No direct physical exam was performed Telephone health visit only today to review interval imaging.  Vital Signs: There were no vitals taken for this visit.  Imaging: CT CHEST ABDOMEN PELVIS WO CONTRAST  Result Date: 07/22/2022 CLINICAL DATA:  Stage IV clear cell renal cell carcinoma with pulmonary metastasis and pituitary metastasis diagnosed last year. History of left renal cell carcinoma status post nephrectomy and adrenalectomy. Right renal cell carcinoma status post cryoablation and immunotherapy. * Tracking Code: BO * EXAM: CT CHEST, ABDOMEN AND PELVIS WITHOUT CONTRAST TECHNIQUE: Multidetector CT imaging of the chest, abdomen and pelvis was performed following the  standard protocol without IV contrast. RADIATION DOSE REDUCTION: This exam was performed according to the departmental dose-optimization program which includes automated exposure control, adjustment of the mA and/or kV according to patient size and/or use of iterative reconstruction technique. COMPARISON:  04/03/2022 FINDINGS: CT CHEST FINDINGS Cardiovascular: Aortic atherosclerosis. Normal heart size, without pericardial effusion. Three vessel coronary artery calcification. Mediastinum/Nodes: No mediastinal or hilar adenopathy, given limitations of unenhanced CT. Lungs/Pleura: No pleural fluid. The pleural-based inferior right hemithorax mass measures 3.4 x 2.3 cm on 48/2 versus 3.9 x 2.2 cm on the prior exam. Maximal dimension on coronal image 47 of 3.5 cm today versus 4.1 cm on the prior Posttreatment volume loss and amorphous soft tissue within the right minor fissure and anterior right lower lobe are similar, including on 77/4 and 106/4. Tiny calcified bilateral pulmonary nodules. The tiny, 5 mm nodule on the right juxta pleural lower lobe on the prior exam  is not identified today. Musculoskeletal: No acute osseous abnormality. CT ABDOMEN PELVIS FINDINGS Hepatobiliary: Normal noncontrast appearance of the liver. Small dependent gallstones. No biliary duct dilatation. Pancreas: Normal, without mass or ductal dilatation. Spleen: Normal in size, without focal abnormality. Adrenals/Urinary Tract: Left nephrectomy and adrenalectomy without local recurrence. Normal right adrenal gland. Ablation site in the posteromedial upper pole right kidney is suboptimally evaluated on this noncontrast exam. There is also an interpolar right renal subtle hypoattenuating 1.2 cm lesion on 66/2 which is incompletely characterized. No right renal calculi or hydronephrosis. No bladder calculi. Stomach/Bowel: Normal stomach, without wall thickening. Scattered colonic diverticula. Normal terminal ileum and appendix. Normal small bowel. Vascular/Lymphatic: Aortic atherosclerosis. Degraded evaluation of the pelvis, secondary to left hip arthroplasty. No abdominal and no gross pelvic adenopathy. Reproductive: Prostate poorly evaluated. Other: No significant free fluid.  No free intraperitoneal air. Musculoskeletal: Left hip arthroplasty.  Lumbosacral spondylosis. IMPRESSION: 1. Mild response to therapy of dominant pleural-based right inferior hemithorax mass. 2. No new or progressive disease. 3. Decreased sensitivity exam secondary to lack of IV contrast. 4. The pancreatic neck cystic lesion detailed on the prior exam is not apparent noncontrast CT. 5. Cholelithiasis 6. Coronary artery atherosclerosis. Aortic Atherosclerosis (ICD10-I70.0). Electronically Signed   By: Jeronimo Greaves M.D.   On: 07/22/2022 11:44    Labs:  CBC: Recent Labs    05/22/22 0756 06/12/22 0855 07/03/22 0854 07/24/22 1040  WBC 7.3 7.5 8.2 8.6  HGB 14.6 14.1 14.6 14.4  HCT 43.1 42.5 44.5 43.7  PLT 249 232 242 230    COAGS: Recent Labs    08/23/21 0849  INR 1.1    BMP: Recent Labs    05/22/22 0756  06/12/22 0855 07/03/22 0854 07/24/22 1040  NA 139 142 141 142  K 4.0 3.9 4.1 4.2  CL 106 108 108 107  CO2 25 27 25 29   GLUCOSE 105* 86 95 95  BUN 19 17 17 16   CALCIUM 8.7* 8.9 9.1 9.1  CREATININE 1.18 1.22 1.26* 1.29*  GFRNONAA >60 >60 >60 >60    LIVER FUNCTION TESTS: Recent Labs    05/22/22 0756 06/12/22 0855 07/03/22 0854 07/24/22 1040  BILITOT 0.3 0.3 0.4 0.4  AST 35 30 27 27   ALT 40 35 33 40  ALKPHOS 80 71 72 64  PROT 7.1 7.1 7.2 6.9  ALBUMIN 4.3 4.2 4.3 4.2      Assessment and Plan:  Nearly 1 year status post CT-guided cryoablation of 2 right renal upper pole neoplasm suspicious for additional renal cell carcinomas versus metastatic disease given the  history of metastatic renal cell carcinoma.  Interval surveillance noncontrast imaging confirms a stable ablation defect in the right kidney upper pole within the limits of noncontrast CT.  Stable thoracic metastatic disease.  No interval progression by CT.  Clinically he continues to do very well.  Plan: Repeat surveillance CT imaging in 3 to 6 months with contrast as long as his creatinine will allow this.  Imaging will be scheduled with Dr. Asa Lente office.    Electronically Signed: Berdine Dance 07/31/2022, 10:59 AM   I spent a total of    40 Minutes in remote  clinical consultation, greater than 50% of which was counseling/coordinating care for This patient with metastatic rectal carcinoma status post cryoablation.    Visit type: Audio only (telephone). Audio (no video) only due to patient's lack of internet/smartphone capability. Alternative for in-person consultation at [facility], [address], [place], [place]. This format is felt to be most appropriate for this patient at this time.  All issues noted in this document were discussed and addressed.

## 2022-08-01 ENCOUNTER — Ambulatory Visit
Admission: RE | Admit: 2022-08-01 | Discharge: 2022-08-01 | Disposition: A | Discharge status: Home / Self Care | Payer: 59 | Source: Ambulatory Visit | Attending: Urology | Admitting: Urology

## 2022-08-01 ENCOUNTER — Encounter: Payer: Self-pay | Admitting: Urology

## 2022-08-01 DIAGNOSIS — C649 Malignant neoplasm of unspecified kidney, except renal pelvis: Secondary | ICD-10-CM

## 2022-08-01 DIAGNOSIS — C7931 Secondary malignant neoplasm of brain: Secondary | ICD-10-CM

## 2022-08-01 NOTE — Progress Notes (Signed)
Radiation Oncology         [phone] ________________________________  Name: Marc [name] MRN: [id number]  Date: 08/01/2022  DOB: [birth year]  Post Treatment Note  CC: Milus Height, PA  Benjiman Core, MD  Diagnosis:    67 yo male with metastatic renal cell carcinoma to the brain/pituitary    Interval Since Last Radiation:  6 months  01/19/22 - 01/29/22: fractionated post-op SRS   06/29/21 - 07/05/21:  The targets in the right upper and lower lobe lung were treated to 50 Gy in 10 fractions of 5 Gy   Narrative:  I spoke with the patient to conduct his routine scheduled 3 month follow up visit to review results from his recent post-treatment MRI brain scan via telephone to spare the patient unnecessary potential exposure in the healthcare setting during the current COVID-19 pandemic.  The patient was notified in advance and gave permission to proceed with this visit format.  He tolerated radiation treatment relatively well without any ill side effects.  He had a recent posttreatment MRI brain scan on 07/26/2022 and we reviewed those results by telephone today.                              On review of systems, the patient states that he is doing well in general and remains without complaints.  He specifically denies headaches, nausea, vomiting, changes in visual or auditory acuity, dizziness, tremor or seizure activity.  He has a good energy level and has remained active.  He reports a healthy appetite and is maintaining his weight.  Overall, he is pleased with his progress to date.  He had recent restaging CT C/A/P on 07/19/2022 which appears stable and without any evidence of new or progressive disease.  He had a recent follow-up visit with Dr. Shirline Frees on 07/24/2022 and the current plan is to continue systemic therapy with [facility] every 3 weeks in addition to daily oral axitinib.  ALLERGIES:  has No Known Allergies.  Meds: Current Outpatient Medications  Medication Sig Dispense Refill    Ascorbic Acid (VITAMIN C PO) Take 1 tablet by mouth every evening.     aspirin EC 81 MG tablet Take 81 mg by mouth every evening. Swallow whole.     axitinib (INLYTA) 5 MG tablet Take 1 tablet (5 mg total) by mouth daily. 30 tablet 3   cholecalciferol (VITAMIN D3) 25 MCG (1000 UNIT) tablet Take 1,000 Units by mouth every evening.     Ferrous Sulfate (IRON SLOW RELEASE PO) Take 1 tablet by mouth every evening.     ibuprofen (ADVIL) 200 MG tablet Take 400-600 mg by mouth every 6 (six) hours as needed for moderate pain.     levothyroxine (SYNTHROID) 88 MCG tablet Take 1 tablet (88 mcg total) by mouth daily. 90 tablet 3   Multiple Vitamin (MULTI VITAMIN) TABS 1 tablet Orally Once a day for 30 day(s)     Multiple Vitamins-Minerals (MULTIVITAMIN WITH MINERALS) tablet Take 1 tablet by mouth every evening.     Omega-3 Fatty Acids (FISH OIL PO) Take 1 capsule by mouth every evening.     predniSONE (DELTASONE) 5 MG tablet Take 1.5 tablets (7.5 mg total) by mouth daily with breakfast. Pt may double up during sick days 150 tablet 3   prochlorperazine (COMPAZINE) 10 MG tablet Take 1 tablet (10 mg total) by mouth every 6 (six) hours as needed for nausea or vomiting. 30 tablet  0   rosuvastatin (CRESTOR) 20 MG tablet Take 1 tablet (20 mg total) by mouth daily. 90 tablet 3   Testosterone (ANDROGEL) 20.25 MG/1.25GM (1.62%) GEL Place 2 Pump onto the skin as directed. 150 g 5   vitamin E 180 MG (400 UNITS) capsule Take 400 Units by mouth every evening.     No current facility-administered medications for this encounter.    Physical Findings:  vitals were not taken for this visit.  Pain Assessment Pain Score: 0-No pain/10 Unable to assess due to telephone follow up visit format.  Lab Findings: Lab Results  Component Value Date   WBC 8.6 07/24/2022   HGB 14.4 07/24/2022   HCT 43.7 07/24/2022   MCV 90.5 07/24/2022   PLT 230 07/24/2022     Radiographic Findings: MR Brain W Wo Contrast  Result Date:  07/31/2022 CLINICAL DATA:  Follow-up sellar mass treated with radiation. EXAM: MRI HEAD WITHOUT AND WITH CONTRAST TECHNIQUE: Multiplanar, multiecho pulse sequences of the brain and surrounding structures were obtained without and with intravenous contrast. CONTRAST:  10 cc Vueway COMPARISON:  04/26/2022.  01/17/2022. FINDINGS: Brain: Diffusion imaging does not show any acute or subacute infarction or other cause of restricted diffusion. No focal abnormality affects the brainstem or cerebellum. Cerebral hemispheres show mild chronic small-vessel ischemic change of the white matter. No large vessel territory insult. No mass lesion, hemorrhage, hydrocephalus or extra-axial collection. After contrast administration, no abnormal enhancement occurs. Further reduction in enhancing tissue in the right side of the sella. No evidence of increasing mass or new mass. Slight reduction in enhancement of the infundibulum. The amount of infundibular enhancement presently is within normal limits. Vascular: Major vessels at the base of the brain show flow. Skull and upper cervical spine: No skull or skull base finding of significance. Sinuses/Orbits: Clear/normal Other: None IMPRESSION: 1. Further reduction in enhancing tissue in the right side of the sella. No evidence of increasing mass or new mass. Slight reduction in enhancement of the infundibulum. The amount of infundibular enhancement presently is within normal limits. 2. Mild chronic small-vessel ischemic change of the cerebral hemispheric white matter. Electronically Signed   By: Paulina Fusi M.D.   On: 07/31/2022 11:15   CT CHEST ABDOMEN PELVIS WO CONTRAST  Result Date: 07/22/2022 CLINICAL DATA:  Stage IV clear cell renal cell carcinoma with pulmonary metastasis and pituitary metastasis diagnosed last year. History of left renal cell carcinoma status post nephrectomy and adrenalectomy. Right renal cell carcinoma status post cryoablation and immunotherapy. * Tracking  Code: BO * EXAM: CT CHEST, ABDOMEN AND PELVIS WITHOUT CONTRAST TECHNIQUE: Multidetector CT imaging of the chest, abdomen and pelvis was performed following the standard protocol without IV contrast. RADIATION DOSE REDUCTION: This exam was performed according to the departmental dose-optimization program which includes automated exposure control, adjustment of the mA and/or kV according to patient size and/or use of iterative reconstruction technique. COMPARISON:  04/03/2022 FINDINGS: CT CHEST FINDINGS Cardiovascular: Aortic atherosclerosis. Normal heart size, without pericardial effusion. Three vessel coronary artery calcification. Mediastinum/Nodes: No mediastinal or hilar adenopathy, given limitations of unenhanced CT. Lungs/Pleura: No pleural fluid. The pleural-based inferior right hemithorax mass measures 3.4 x 2.3 cm on 48/2 versus 3.9 x 2.2 cm on the prior exam. Maximal dimension on coronal image 47 of 3.5 cm today versus 4.1 cm on the prior Posttreatment volume loss and amorphous soft tissue within the right minor fissure and anterior right lower lobe are similar, including on 77/4 and 106/4. Tiny calcified bilateral pulmonary nodules. The  tiny, 5 mm nodule on the right juxta pleural lower lobe on the prior exam is not identified today. Musculoskeletal: No acute osseous abnormality. CT ABDOMEN PELVIS FINDINGS Hepatobiliary: Normal noncontrast appearance of the liver. Small dependent gallstones. No biliary duct dilatation. Pancreas: Normal, without mass or ductal dilatation. Spleen: Normal in size, without focal abnormality. Adrenals/Urinary Tract: Left nephrectomy and adrenalectomy without local recurrence. Normal right adrenal gland. Ablation site in the posteromedial upper pole right kidney is suboptimally evaluated on this noncontrast exam. There is also an interpolar right renal subtle hypoattenuating 1.2 cm lesion on 66/2 which is incompletely characterized. No right renal calculi or hydronephrosis. No  bladder calculi. Stomach/Bowel: Normal stomach, without wall thickening. Scattered colonic diverticula. Normal terminal ileum and appendix. Normal small bowel. Vascular/Lymphatic: Aortic atherosclerosis. Degraded evaluation of the pelvis, secondary to left hip arthroplasty. No abdominal and no gross pelvic adenopathy. Reproductive: Prostate poorly evaluated. Other: No significant free fluid.  No free intraperitoneal air. Musculoskeletal: Left hip arthroplasty.  Lumbosacral spondylosis. IMPRESSION: 1. Mild response to therapy of dominant pleural-based right inferior hemithorax mass. 2. No new or progressive disease. 3. Decreased sensitivity exam secondary to lack of IV contrast. 4. The pancreatic neck cystic lesion detailed on the prior exam is not apparent noncontrast CT. 5. Cholelithiasis 6. Coronary artery atherosclerosis. Aortic Atherosclerosis (ICD10-I70.0). Electronically Signed   By: Jeronimo Greaves M.D.   On: 07/22/2022 11:44    Impression/Plan: 1.  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 He has recovered well from the effects of his recent fractionated postoperative SRS treatment and remains without complaints.  His recent posttreatment MRI brain scan from 07/26/2022 shows an excellent treatment response with further decreased size of the residual tissue and no new lesions.  There was a slight reduction in enhancement of the infundibulum, presently within normal limits. Therefore, we discussed the plan to continue with serial MRI brain scans every 3 months to continue to monitor for any evidence of disease recurrence or progression.  I will plan to follow-up with him by telephone following each scan to review results and any recommendations from the multidisciplinary brain conference.  He will also continue in routine follow-up with Dr. Shirline Frees for continued management of his systemic disease.  He knows that he is welcome to call at anytime in the interim with any questions  or concerns regarding his previous radiation treatments.  I personally spent 20 minutes in this encounter including chart review, reviewing radiological studies, telephone conversation with the patient, entering orders and completing documentation.    Marguarite Arbour, PA-C

## 2022-08-01 NOTE — Progress Notes (Signed)
Telephone nursing appointment for patient to review most recent scan results from 07/26/2022, in reference to patient's Metastasis to Pituitary [facility]). I verified patient's identity and began nursing interview. Patient reports doing well. No issues conveyed at this time.   Meaningful use complete.   Patient aware of their 9:30am-08/01/2022 telephone appointment w/ Ashlyn Bruning PA-C. I left my extension [phone] in case patient needs anything. Patient verbalized understanding. This concludes the nursing interview.   Patient contact [phone]     Ruel Favors, LPN

## 2022-08-07 ENCOUNTER — Other Ambulatory Visit: Payer: 59

## 2022-08-09 ENCOUNTER — Encounter: Payer: Self-pay | Admitting: Internal Medicine

## 2022-08-09 ENCOUNTER — Ambulatory Visit: Payer: BLUE CROSS/BLUE SHIELD | Attending: Cardiovascular Disease

## 2022-08-09 DIAGNOSIS — I2510 Atherosclerotic heart disease of native coronary artery without angina pectoris: Secondary | ICD-10-CM

## 2022-08-09 DIAGNOSIS — E782 Mixed hyperlipidemia: Secondary | ICD-10-CM

## 2022-08-09 LAB — BASIC METABOLIC PANEL
BUN/Creatinine Ratio: 13 (ref 10–24)
BUN: 18 mg/dL (ref 8–27)
CO2: 25 mmol/L (ref 20–29)
Calcium: 8.6 mg/dL (ref 8.6–10.2)
Chloride: 101 mmol/L (ref 96–106)
Creatinine, Ser: 1.37 mg/dL — ABNORMAL HIGH (ref 0.76–1.27)
Glucose: 98 mg/dL (ref 70–99)
Potassium: 4.4 mmol/L (ref 3.5–5.2)
Sodium: 140 mmol/L (ref 134–144)
eGFR: 57 mL/min/{1.73_m2} — ABNORMAL LOW (ref >59)

## 2022-08-09 LAB — LIPID PANEL
Chol/HDL Ratio: 2.9 ratio (ref 0.0–5.0)
Cholesterol, Total: 155 mg/dL (ref 100–199)
HDL: 54 mg/dL (ref >39)
LDL Chol Calc (NIH): 73 mg/dL (ref 0–99)
Triglycerides: 167 mg/dL — ABNORMAL HIGH (ref 0–149)
VLDL Cholesterol Cal: 28 mg/dL (ref 5–40)

## 2022-08-09 LAB — ALT: ALT: 47 IU/L — ABNORMAL HIGH (ref 0–44)

## 2022-08-09 NOTE — Progress Notes (Signed)
[facility] OFFICE PROGRESS NOTE  [name], [place], [name] [address] [place] [address]  DIAGNOSIS: 67 year old man with kidney cancer diagnosed in 2021.  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   PRIOR THERAPY:  1) He underwent robotic assisted laparoscopic left radical nephrectomy by Dr. Liliane Shi on January 05, 2020.  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  The final pathological staging was T3aN0 grade 2 tumor.     2) He is status post radiation therapy to the pulmonary nodules completed March 2023.  He received 50 Gray in 5 fractions.   3) He is status post cryoablation to 2 lesions in the right kidney completed on Aug 23, 2021.   4) He is s/p endoscopic transsphenoidal pituitary resection completed by Dr. Annalee Genta on November 08, 2021.  The final pathology showed clear-cell renal cell carcinoma.   5) SRS treatment to pituitary bed completed in October 2023.  He received 5 fractions for a total of 25 Gray.  CURRENT THERAPY: Pembrolizumab 200 mg every 3 weeks started on Aug 04, 2021. Status post 12 cycles of treatment and Axitinib 5 mg daily started on February 09, 2022.   INTERVAL HISTORY: MERCY PANGAN 67 y.o. male returns to the clinic today for a follow-up visit.  The patient is feeling fairly well today without any concerning complaints.  He is currently undergoing treatment with IV immunotherapy with Keytruda every 3 weeks and Axitinib p.o. daily.  The patient is tolerating treatment well without any concerning adverse side effects except he gets mild diarrhea which he is using imodium once der day. Imodium is effective. He estimates he has about 3-4 loose stools per day. He was not aware he can take more imodium. Denies abdominal pain or blood in  the stool. He also mentions he had changed his dietary practices recently and is wondering if that may also have changed his bowel habits. He is trying to eat healthier per his cardiologist recommendations. The patient denies any recent fever, chills, night sweats, unexplained weight loss. He denies any appetite changes. He denies any rashes or skin changes except he mentions the skin on his hands feels a little abnormal/rough.  Denies any chest pain, shortness of breath, cough, or hemoptysis.  Denies any nausea, vomiting, or constipation. Denies any dysuria, malodorous urine, urinary frequency or urgency.  Denies any headache or visual changes.  He recently had a brain MRI performed by Dr. Kathrynn Running which showed no new findings.  He is here today for evaluation and repeat blood work before undergoing his next cycle of treatment with cycle #13.   MEDICAL HISTORY: Past Medical History:  Diagnosis Date   Arthritis    Cancer (HCC)    Kidney, Lung, Pituitary   Hyperlipidemia    Hypothyroidism     ALLERGIES:  has No Known Allergies.  MEDICATIONS:  Current Outpatient Medications  Medication Sig Dispense Refill   Ascorbic Acid (VITAMIN C PO) Take 1 tablet by mouth every evening.     aspirin EC 81 MG tablet Take 81 mg by mouth every evening. Swallow whole.     axitinib (INLYTA) 5 MG tablet Take 1 tablet (5 mg total) by mouth daily. 30 tablet 3   cholecalciferol (VITAMIN D3) 25 MCG (1000 UNIT) tablet Take 1,000 Units by mouth every  evening.     Ferrous Sulfate (IRON SLOW RELEASE PO) Take 1 tablet by mouth every evening.     ibuprofen (ADVIL) 200 MG tablet Take 400-600 mg by mouth every 6 (six) hours as needed for moderate pain.     levothyroxine (SYNTHROID) 88 MCG tablet Take 1 tablet (88 mcg total) by mouth daily. 90 tablet 3   Multiple Vitamin (MULTI VITAMIN) TABS 1 tablet Orally Once a day for 30 day(s)     Multiple Vitamins-Minerals (MULTIVITAMIN WITH MINERALS) tablet Take 1 tablet by mouth every  evening.     Omega-3 Fatty Acids (FISH OIL PO) Take 1 capsule by mouth every evening.     predniSONE (DELTASONE) 5 MG tablet Take 1.5 tablets (7.5 mg total) by mouth daily with breakfast. Pt may double up during sick days 150 tablet 3   prochlorperazine (COMPAZINE) 10 MG tablet Take 1 tablet (10 mg total) by mouth every 6 (six) hours as needed for nausea or vomiting. 30 tablet 0   rosuvastatin (CRESTOR) 20 MG tablet Take 1 tablet (20 mg total) by mouth daily. 90 tablet 3   Testosterone (ANDROGEL) 20.25 MG/1.25GM (1.62%) GEL Place 2 Pump onto the skin as directed. 150 g 5   vitamin E 180 MG (400 UNITS) capsule Take 400 Units by mouth every evening.     No current facility-administered medications for this visit.    SURGICAL HISTORY:  Past Surgical History:  Procedure Laterality Date   CRANIOTOMY N/A 11/08/2021   Procedure: ENDOSCOPIC ENDONASAL RESECTION OF SELLAR MASS;  Surgeon: Jadene Pierini, MD;  Location: [facility] OR;  Service: Neurosurgery;  Laterality: N/A;  RM 20   IR RADIOLOGIST EVAL & MGMT  07/14/2021   IR RADIOLOGIST EVAL & MGMT  07/27/2021   IR RADIOLOGIST EVAL & MGMT  01/30/2022   RADIOFREQUENCY ABLATION N/A 08/23/2021   Procedure: RENAL CRYO ABLATION;  Surgeon: Berdine Dance, MD;  Location: [facility] ORS;  Service: Anesthesiology;  Laterality: N/A;   right shoulder rotator cuff repair Right    ROBOT ASSISTED LAPAROSCOPIC NEPHRECTOMY Left 01/05/2020   Procedure: XI ROBOTIC ASSISTED LAPAROSCOPIC RADICAL NEPHRECTOMY;  Surgeon: Rene Paci, MD;  Location: [facility] ORS;  Service: Urology;  Laterality: Left;   TOTAL HIP ARTHROPLASTY Left 03/31/2020   Procedure: LEFT TOTAL HIP ARTHROPLASTY ANTERIOR APPROACH;  Surgeon: Kathryne Hitch, MD;  Location: [facility] OR;  Service: Orthopedics;  Laterality: Left;   TRANSPHENOIDAL APPROACH EXPOSURE N/A 11/08/2021   Procedure: TRANSPHENOIDAL APPROACH EXPOSURE;  Surgeon: Osborn Coho, MD;  Location: [facility] OR;  Service: ENT;  Laterality: N/A;     REVIEW OF SYSTEMS:   Review of Systems  Constitutional: Negative for appetite change, chills, fatigue, fever and unexpected weight change.  HENT:   Negative for mouth sores, nosebleeds, sore throat and trouble swallowing.   Eyes: Negative for eye problems and icterus.  Respiratory: Negative for cough, hemoptysis, shortness of breath and wheezing.   Cardiovascular: Negative for chest pain and leg swelling.  Gastrointestinal: Positive for diarrhea. Negative for abdominal pain, constipation, nausea and vomiting.  Genitourinary: Negative for bladder incontinence, difficulty urinating, dysuria, frequency and hematuria.   Musculoskeletal: Negative for back pain, gait problem, neck pain and neck stiffness.  Skin: Negative for itching and rash.  Neurological: Negative for dizziness, extremity weakness, gait problem, headaches, light-headedness and seizures.  Hematological: Negative for adenopathy. Does not bruise/bleed easily.  Psychiatric/Behavioral: Negative for confusion, depression and sleep disturbance. The patient is not nervous/anxious.     PHYSICAL EXAMINATION:  Blood pressure 135/85, pulse  63, temperature 97.7 F (36.5 C), temperature source Oral, resp. rate 19, weight 233 lb 1.6 oz (105.7 kg), SpO2 98 %.  ECOG PERFORMANCE STATUS: 1  Physical Exam  Constitutional: Oriented to person, place, and time and well-developed, well-nourished, and in no distress.  HENT:  Head: Normocephalic and atraumatic.  Mouth/Throat: Oropharynx is clear and moist. No oropharyngeal exudate.  Eyes: Conjunctivae are normal. Right eye exhibits no discharge. Left eye exhibits no discharge. No scleral icterus.  Neck: Normal range of motion. Neck supple.  Cardiovascular: Normal rate, regular rhythm, normal heart sounds and intact distal pulses.   Pulmonary/Chest: Effort normal and breath sounds normal. No respiratory distress. No wheezes. No rales.  Abdominal: Soft. Bowel sounds are normal. Exhibits no  distension and no mass. There is no tenderness.  Musculoskeletal: Normal range of motion. Exhibits no edema.  Lymphadenopathy:    No cervical adenopathy.  Neurological: Alert and oriented to person, place, and time. Exhibits normal muscle tone. Gait normal. Coordination normal.  Skin: Skin is warm and dry. No rash noted. Not diaphoretic. No erythema. No pallor.  Psychiatric: Mood, memory and judgment normal.  Vitals reviewed.  LABORATORY DATA: Lab Results  Component Value Date   WBC 7.2 08/14/2022   HGB 14.6 08/14/2022   HCT 44.6 08/14/2022   MCV 90.3 08/14/2022   PLT 226 08/14/2022      Chemistry      Component Value Date/Time   NA 139 08/14/2022 0918   NA 140 08/09/2022 1014   K 4.2 08/14/2022 0918   CL 108 08/14/2022 0918   CO2 25 08/14/2022 0918   BUN 18 08/14/2022 0918   BUN 18 08/09/2022 1014   CREATININE 1.23 08/14/2022 0918      Component Value Date/Time   CALCIUM 8.8 (L) 08/14/2022 0918   ALKPHOS 63 08/14/2022 0918   AST 31 08/14/2022 0918   ALT 41 08/14/2022 0918   BILITOT 0.5 08/14/2022 0918       RADIOGRAPHIC STUDIES:  MR Brain W Wo Contrast  Result Date: 07/31/2022 CLINICAL DATA:  Follow-up sellar mass treated with radiation. EXAM: MRI HEAD WITHOUT AND WITH CONTRAST TECHNIQUE: Multiplanar, multiecho pulse sequences of the brain and surrounding structures were obtained without and with intravenous contrast. CONTRAST:  10 cc Vueway COMPARISON:  04/26/2022.  01/17/2022. FINDINGS: Brain: Diffusion imaging does not show any acute or subacute infarction or other cause of restricted diffusion. No focal abnormality affects the brainstem or cerebellum. Cerebral hemispheres show mild chronic small-vessel ischemic change of the white matter. No large vessel territory insult. No mass lesion, hemorrhage, hydrocephalus or extra-axial collection. After contrast administration, no abnormal enhancement occurs. Further reduction in enhancing tissue in the right side of the  sella. No evidence of increasing mass or new mass. Slight reduction in enhancement of the infundibulum. The amount of infundibular enhancement presently is within normal limits. Vascular: Major vessels at the base of the brain show flow. Skull and upper cervical spine: No skull or skull base finding of significance. Sinuses/Orbits: Clear/normal Other: None IMPRESSION: 1. Further reduction in enhancing tissue in the right side of the sella. No evidence of increasing mass or new mass. Slight reduction in enhancement of the infundibulum. The amount of infundibular enhancement presently is within normal limits. 2. Mild chronic small-vessel ischemic change of the cerebral hemispheric white matter. Electronically Signed   By: Paulina Fusi M.D.   On: 07/31/2022 11:15   CT CHEST ABDOMEN PELVIS WO CONTRAST  Result Date: 07/22/2022 CLINICAL DATA:  Stage IV clear  cell renal cell carcinoma with pulmonary metastasis and pituitary metastasis diagnosed last year. History of left renal cell carcinoma status post nephrectomy and adrenalectomy. Right renal cell carcinoma status post cryoablation and immunotherapy. * Tracking Code: BO * EXAM: CT CHEST, ABDOMEN AND PELVIS WITHOUT CONTRAST TECHNIQUE: Multidetector CT imaging of the chest, abdomen and pelvis was performed following the standard protocol without IV contrast. RADIATION DOSE REDUCTION: This exam was performed according to the departmental dose-optimization program which includes automated exposure control, adjustment of the mA and/or kV according to patient size and/or use of iterative reconstruction technique. COMPARISON:  04/03/2022 FINDINGS: CT CHEST FINDINGS Cardiovascular: Aortic atherosclerosis. Normal heart size, without pericardial effusion. Three vessel coronary artery calcification. Mediastinum/Nodes: No mediastinal or hilar adenopathy, given limitations of unenhanced CT. Lungs/Pleura: No pleural fluid. The pleural-based inferior right hemithorax mass measures  3.4 x 2.3 cm on 48/2 versus 3.9 x 2.2 cm on the prior exam. Maximal dimension on coronal image 47 of 3.5 cm today versus 4.1 cm on the prior Posttreatment volume loss and amorphous soft tissue within the right minor fissure and anterior right lower lobe are similar, including on 77/4 and 106/4. Tiny calcified bilateral pulmonary nodules. The tiny, 5 mm nodule on the right juxta pleural lower lobe on the prior exam is not identified today. Musculoskeletal: No acute osseous abnormality. CT ABDOMEN PELVIS FINDINGS Hepatobiliary: Normal noncontrast appearance of the liver. Small dependent gallstones. No biliary duct dilatation. Pancreas: Normal, without mass or ductal dilatation. Spleen: Normal in size, without focal abnormality. Adrenals/Urinary Tract: Left nephrectomy and adrenalectomy without local recurrence. Normal right adrenal gland. Ablation site in the posteromedial upper pole right kidney is suboptimally evaluated on this noncontrast exam. There is also an interpolar right renal subtle hypoattenuating 1.2 cm lesion on 66/2 which is incompletely characterized. No right renal calculi or hydronephrosis. No bladder calculi. Stomach/Bowel: Normal stomach, without wall thickening. Scattered colonic diverticula. Normal terminal ileum and appendix. Normal small bowel. Vascular/Lymphatic: Aortic atherosclerosis. Degraded evaluation of the pelvis, secondary to left hip arthroplasty. No abdominal and no gross pelvic adenopathy. Reproductive: Prostate poorly evaluated. Other: No significant free fluid.  No free intraperitoneal air. Musculoskeletal: Left hip arthroplasty.  Lumbosacral spondylosis. IMPRESSION: 1. Mild response to therapy of dominant pleural-based right inferior hemithorax mass. 2. No new or progressive disease. 3. Decreased sensitivity exam secondary to lack of IV contrast. 4. The pancreatic neck cystic lesion detailed on the prior exam is not apparent noncontrast CT. 5. Cholelithiasis 6. Coronary artery  atherosclerosis. Aortic Atherosclerosis (ICD10-I70.0). Electronically Signed   By: Jeronimo Greaves M.D.   On: 07/22/2022 11:44     ASSESSMENT/PLAN:  This is a very pleasant 67 year old male diagnosed with renal cell carcinoma.  He was initially diagnosed and 2021.  He was found to have metastatic disease with pulmonary involvement and pituitary metastasis and 2023   The patient underwent robot-assisted laparoscopic left radical nephrectomy by Dr. Liliane Shi on 01/05/2020 that showed a 10.7 cm lesion with extension into the perinephric tissue 0 of 4 lymph nodes involved.  The final pathology was T3a, N0, grade 2 tumor.   He then underwent radiation therapy to the pulmonary nodules which was completed in March 2023.   He then underwent cryoablation to 2 lesions in the right kidney which was performed on 08/23/2021   He then underwent SRS treatment to the pituitary bed which was completed in October 2023.   The patient is currently undergoing pembrolizumab 200 mg IV every 3 weeks which is started on 08/04/2021. He is  status post 12 cycles.  He is also on Axitnib 5 mg daily which was started in February 09, 2022.    Labs were reviewed.  Recommend that he proceed with the next cycle of treatment today as scheduled.   Will see him back for follow-up visit in 3 weeks for evaluation repeat blood work before undergoing cycle #14.  We reviewed the dosing for imodium. If needed, he can take an additional 2 mg of imodium after each loose stool up until the max dose of 16 mg per day. He reports the diarrhea is manageable and mild. His CT scan from last month did not show any inflammatory changes in the bowel. He knows to call us with any new or worsening symptoms such as >6 loose stools per day.    The patient was advised to call immediately if she has any concerning symptoms in the interval. The patient voices understanding of current disease status and treatment options and is in agreement with the current care  plan. All questions were answered. The patient knows to call the clinic with any problems, questions or concerns. We can certainly see the patient much sooner if necessary     No orders of the defined types were placed in this encounter.    The total time spent in the appointment was 20-29 minutes.  Isadore Bokhari L Drey Shaff, PA-C 08/14/22

## 2022-08-14 ENCOUNTER — Inpatient Hospital Stay: Payer: Medicare Other | Attending: Oncology

## 2022-08-14 ENCOUNTER — Inpatient Hospital Stay: Payer: Medicare Other

## 2022-08-14 ENCOUNTER — Other Ambulatory Visit: Payer: Self-pay | Admitting: Radiation Therapy

## 2022-08-14 ENCOUNTER — Inpatient Hospital Stay (HOSPITAL_BASED_OUTPATIENT_CLINIC_OR_DEPARTMENT_OTHER): Payer: Medicare Other | Admitting: Physician Assistant

## 2022-08-14 VITALS — BP 135/85 | HR 63 | Temp 97.7°F | Resp 19 | Wt 233.1 lb

## 2022-08-14 DIAGNOSIS — C642 Malignant neoplasm of left kidney, except renal pelvis: Secondary | ICD-10-CM

## 2022-08-14 DIAGNOSIS — C649 Malignant neoplasm of unspecified kidney, except renal pelvis: Secondary | ICD-10-CM

## 2022-08-14 DIAGNOSIS — Z79620 Long term (current) use of immunosuppressive biologic: Secondary | ICD-10-CM | POA: Insufficient documentation

## 2022-08-14 DIAGNOSIS — C7801 Secondary malignant neoplasm of right lung: Secondary | ICD-10-CM

## 2022-08-14 DIAGNOSIS — C7931 Secondary malignant neoplasm of brain: Secondary | ICD-10-CM

## 2022-08-14 DIAGNOSIS — Z5112 Encounter for antineoplastic immunotherapy: Secondary | ICD-10-CM | POA: Insufficient documentation

## 2022-08-14 LAB — CBC WITH DIFFERENTIAL (CANCER CENTER ONLY)
Abs Immature Granulocytes: 0.02 10*3/uL (ref 0.00–0.07)
Basophils Absolute: 0 10*3/uL (ref 0.0–0.1)
Basophils Relative: 1 %
Eosinophils Absolute: 0.2 10*3/uL (ref 0.0–0.5)
Eosinophils Relative: 3 %
HCT: 44.6 % (ref 39.0–52.0)
Hemoglobin: 14.6 g/dL (ref 13.0–17.0)
Immature Granulocytes: 0 %
Lymphocytes Relative: 41 %
Lymphs Abs: 2.9 10*3/uL (ref 0.7–4.0)
MCH: 29.6 pg (ref 26.0–34.0)
MCHC: 32.7 g/dL (ref 30.0–36.0)
MCV: 90.3 fL (ref 80.0–100.0)
Monocytes Absolute: 0.7 10*3/uL (ref 0.1–1.0)
Monocytes Relative: 10 %
Neutro Abs: 3.4 10*3/uL (ref 1.7–7.7)
Neutrophils Relative %: 45 %
Platelet Count: 226 10*3/uL (ref 150–400)
RBC: 4.94 MIL/uL (ref 4.22–5.81)
RDW: 15.2 % (ref 11.5–15.5)
WBC Count: 7.2 10*3/uL (ref 4.0–10.5)
nRBC: 0 % (ref 0.0–0.2)

## 2022-08-14 LAB — CMP (CANCER CENTER ONLY)
ALT: 41 U/L (ref 0–44)
AST: 31 U/L (ref 15–41)
Albumin: 4.3 g/dL (ref 3.5–5.0)
Alkaline Phosphatase: 63 U/L (ref 38–126)
Anion gap: 6 (ref 5–15)
BUN: 18 mg/dL (ref 8–23)
CO2: 25 mmol/L (ref 22–32)
Calcium: 8.8 mg/dL — ABNORMAL LOW (ref 8.9–10.3)
Chloride: 108 mmol/L (ref 98–111)
Creatinine: 1.23 mg/dL (ref 0.61–1.24)
GFR, Estimated: >60 mL/min (ref >60)
Glucose, Bld: 91 mg/dL (ref 70–99)
Potassium: 4.2 mmol/L (ref 3.5–5.1)
Sodium: 139 mmol/L (ref 135–145)
Total Bilirubin: 0.5 mg/dL (ref 0.3–1.2)
Total Protein: 7.1 g/dL (ref 6.5–8.1)

## 2022-08-14 LAB — TSH: TSH: 0.011 u[IU]/mL — ABNORMAL LOW (ref 0.350–4.500)

## 2022-08-14 MED ORDER — SODIUM CHLORIDE 0.9 % IV SOLN
200.0000 mg | Freq: Once | INTRAVENOUS | Status: AC
  Administered 2022-08-14: 200 mg via INTRAVENOUS
  Filled 2022-08-14: qty 200

## 2022-08-14 MED ORDER — SODIUM CHLORIDE 0.9 % IV SOLN: Freq: Once | INTRAVENOUS | Status: AC

## 2022-08-14 NOTE — Patient Instructions (Signed)
[facility]  Discharge Instructions: Thank you for choosing [facility] to provide your oncology and hematology care.   If you have a lab appointment with the Cancer Center, please go directly to the Cancer Center and check in at the registration area.   Wear comfortable clothing and clothing appropriate for easy access to any Portacath or PICC line.   We strive to give you quality time with your provider. You may need to reschedule your appointment if you arrive late (15 or more minutes).  Arriving late affects you and other patients whose appointments are after yours.  Also, if you miss three or more appointments without notifying the office, you may be dismissed from the clinic at the provider's discretion.      For prescription refill requests, have your pharmacy contact our office and allow 72 hours for refills to be completed.    Today you received the following chemotherapy and/or immunotherapy agents: pembrolizumab      To help prevent nausea and vomiting after your treatment, we encourage you to take your nausea medication as directed.  BELOW ARE SYMPTOMS THAT SHOULD BE REPORTED IMMEDIATELY: *FEVER GREATER THAN 100.4 F (38 C) OR HIGHER *CHILLS OR SWEATING *NAUSEA AND VOMITING THAT IS NOT CONTROLLED WITH YOUR NAUSEA MEDICATION *UNUSUAL SHORTNESS OF BREATH *UNUSUAL BRUISING OR BLEEDING *URINARY PROBLEMS (pain or burning when urinating, or frequent urination) *BOWEL PROBLEMS (unusual diarrhea, constipation, pain near the anus) TENDERNESS IN MOUTH AND THROAT WITH OR WITHOUT PRESENCE OF ULCERS (sore throat, sores in mouth, or a toothache) UNUSUAL RASH, SWELLING OR PAIN  UNUSUAL VAGINAL DISCHARGE OR ITCHING   Items with * indicate a potential emergency and should be followed up as soon as possible or go to the Emergency Department if any problems should occur.  Please show the CHEMOTHERAPY ALERT CARD or IMMUNOTHERAPY ALERT CARD at  check-in to the Emergency Department and triage nurse.  Should you have questions after your visit or need to cancel or reschedule your appointment, please contact [facility]  Dept: [phone]  and follow the prompts.  Office hours are 8:00 a.m. to 4:30 p.m. Monday - Friday. Please note that voicemails left after 4:00 p.m. may not be returned until the following business day.  We are closed weekends and major holidays. You have access to a nurse at all times for urgent questions. Please call the main number to the clinic Dept: [phone] and follow the prompts.   For any non-urgent questions, you may also contact your provider using MyChart. We now offer e-Visits for anyone 18 and older to request care online for non-urgent symptoms. For details visit [email or web address].   Also download the MyChart app! Go to the app store, search "MyChart", open the app, select [facility], and log in with your MyChart username and password.   

## 2022-08-15 ENCOUNTER — Encounter: Payer: Self-pay | Admitting: Cardiovascular Disease

## 2022-08-15 NOTE — Progress Notes (Signed)
Cardiology Office Note:    Date:  08/17/2022   ID:  Marc [name], DOB [birth year], MRN [id number]  PCP:  Marc Height, PA   [facility] Providers Cardiologist:  Marc [name]     Referring MD: Marc [name]   Chief Complaint  Patient presents with   Coronary Artery Disease    History of Present Illness: Feb. 14, 2024   Marc [name] is a 67 y.o. male with a hx of HTN, HLD , renal cell cancer  - is 1/2 way through with his immunotheraphy  He was recently found to have aortic atherosclerosis   10 years ago, his younger brother died of an MI   He went to a Dr. Jacinto Halim , he was found to have coronary artery calcifications but no obstructive coronary artery disease.  I suspect that he may have had a Myoview study.  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)   Former smoker ,  quit 1 years ago   Had a Art gallery manager tumor , lost 50 lbs, Then was diagnosed with tumor ,  starte Cortef,  regained the 50 lbs    Aug 16, 2022 Marc [name] is seen for follow up of his CAD, HTN, HLD, renal cell cancer No cardiac issues  Is exercising  Using the elliptical   Is having some diarrhea,  thinks it may be due to his immunotherapy  Has a year of treatments left   Past Medical History:  Diagnosis Date   Arthritis    Cancer (HCC)    Kidney, Lung, Pituitary   Hyperlipidemia    Hypothyroidism     Past Surgical History:  Procedure Laterality Date   CRANIOTOMY N/A 11/08/2021   Procedure: ENDOSCOPIC ENDONASAL RESECTION OF SELLAR MASS;  Surgeon: Jadene Pierini, MD;  [facility]: [facility];  Service: Neurosurgery;  Laterality: N/A;  RM 20   IR RADIOLOGIST EVAL & MGMT  07/14/2021   IR RADIOLOGIST EVAL & MGMT  07/27/2021   IR RADIOLOGIST EVAL & MGMT  01/30/2022   RADIOFREQUENCY ABLATION N/A 08/23/2021   Procedure: RENAL CRYO ABLATION;  Surgeon: Berdine Dance, MD;  Location:  [facility] ORS;  Service: Anesthesiology;  Laterality: N/A;   right shoulder rotator cuff repair Right    ROBOT ASSISTED LAPAROSCOPIC NEPHRECTOMY Left 01/05/2020   Procedure: XI ROBOTIC ASSISTED LAPAROSCOPIC RADICAL NEPHRECTOMY;  Surgeon: Rene Paci, MD;  Location: [facility];  Service: Urology;  Laterality: Left;   TOTAL HIP ARTHROPLASTY Left 03/31/2020   Procedure: LEFT TOTAL HIP ARTHROPLASTY ANTERIOR APPROACH;  Surgeon: Kathryne Hitch, MD;  [facility]: [facility];  Service: Orthopedics;  Laterality: Left;   TRANSPHENOIDAL APPROACH EXPOSURE N/A 11/08/2021   Procedure: TRANSPHENOIDAL APPROACH EXPOSURE;  Surgeon: Osborn Coho, MD;  Location: [facility];  Service: ENT;  Laterality: N/A;    Current Medications: Current Meds  Medication Sig   Ascorbic Acid (VITAMIN C PO) Take 1 tablet by mouth every evening.   aspirin EC 81 MG tablet Take 81 mg by mouth every evening. Swallow whole.   axitinib (INLYTA) 5 MG tablet Take 1 tablet (5 mg total) by mouth daily.   cholecalciferol (VITAMIN D3) 25 MCG (1000 UNIT) tablet Take 1,000 Units by mouth every evening.   Ferrous Sulfate (IRON SLOW RELEASE PO) Take 1 tablet by mouth every evening.   ibuprofen (ADVIL) 200 MG tablet Take 400-600 mg by mouth  every 6 (six) hours as needed for moderate pain.   levothyroxine (SYNTHROID) 88 MCG tablet Take 1 tablet (88 mcg total) by mouth daily.   Multiple Vitamin (MULTI VITAMIN) TABS 1 tablet Orally Once a day for 30 day(s)   Multiple Vitamins-Minerals (MULTIVITAMIN WITH MINERALS) tablet Take 1 tablet by mouth every evening.   Omega-3 Fatty Acids (FISH OIL PO) Take 1 capsule by mouth every evening.   predniSONE (DELTASONE) 5 MG tablet Take 1.5 tablets (7.5 mg total) by mouth daily with breakfast. Pt may double up during sick days   prochlorperazine (COMPAZINE) 10 MG tablet Take 1 tablet (10 mg total) by mouth every 6 (six) hours as needed for nausea or vomiting.   rosuvastatin (CRESTOR) 20 MG tablet Take 1 tablet (20  mg total) by mouth daily.   Testosterone (ANDROGEL) 20.25 MG/1.25GM (1.62%) GEL Place 2 Pump onto the skin as directed.   vitamin E 180 MG (400 UNITS) capsule Take 400 Units by mouth every evening.     Allergies:   Patient has no known allergies.   Social History   Socioeconomic History   Marital status: Single    Spouse name: Not on file   Number of children: 2   Years of education: Not on file   Highest education level: Not on file  Occupational History   Not on file  Tobacco Use   Smoking status: Former    Years: 30    Types: Cigarettes    Quit date: 04/03/2015    Years since quitting: 7.3   Smokeless tobacco: Never  Vaping Use   Vaping Use: Former   Quit date: 04/16/2021   Substances: Nicotine, Flavoring  Substance and Sexual Activity   Alcohol use: Yes    Comment: occas    Drug use: No   Sexual activity: Not on file  Other Topics Concern   Not on file  Social History Narrative   Not on file   Social Determinants of Health   Financial Resource Strain: Not on file  Food Insecurity: No Food Insecurity (05/01/2022)   Hunger Vital Sign    Worried About Running Out of Food in the Last Year: Never true    Ran Out of Food in the Last Year: Never true  Transportation Needs: No Transportation Needs (05/01/2022)   PRAPARE - Administrator, Civil Service (Medical): No    Lack of Transportation (Non-Medical): No  Physical Activity: Not on file  Stress: Not on file  Social Connections: Not on file     Family History: The patient's family history is not on file.  ROS:   Please see the history of present illness.     All other systems reviewed and are negative.  EKGs/Labs/Other Studies Reviewed:    The following studies were reviewed today:   EKG:    Recent Labs: 08/14/2022: ALT 41; BUN 18; Creatinine 1.23; Hemoglobin 14.6; Platelet Count 226; Potassium 4.2; Sodium 139; TSH 0.011  Recent Lipid Panel    Component Value Date/Time   CHOL 155 08/09/2022  1014   TRIG 167 (H) 08/09/2022 1014   HDL 54 08/09/2022 1014   CHOLHDL 2.9 08/09/2022 1014   LDLCALC 73 08/09/2022 1014     Risk Assessment/Calculations:                Physical Exam:    Physical Exam: Blood pressure 118/80, pulse 68, [name] 5\' 9"  (1.753 m), weight 231 lb (104.8 kg), SpO2 98 %.  GEN:  Well nourished, well developed in no acute distress HEENT: Normal NECK: No JVD; No carotid bruits LYMPHATICS: No lymphadenopathy CARDIAC: RRR , no murmurs, rubs, gallops RESPIRATORY:  Clear to auscultation without rales, wheezing or rhonchi  ABDOMEN: Soft, non-tender, non-distended MUSCULOSKELETAL:  No edema; No deformity  SKIN: Warm and dry NEUROLOGIC:  Alert and oriented x 3   ASSESSMENT:    1. Mixed hyperlipidemia   2. Coronary artery calcification of native artery     PLAN:      Coronary artery calcifications: Marc [name] is seen today for follow-up of coronary artery calcifications.   No symptoms of angina  2.  HLD :  lipids are improving .  I suggested a lower fat, low carb diet.  More exercise   Will see him in a year           Medication Adjustments/Labs and Tests Ordered: Current medicines are reviewed at length with the patient today.  Concerns regarding medicines are outlined above.  No orders of the defined types were placed in this encounter.  No orders of the defined types were placed in this encounter.   Patient Instructions  Medication Instructions:  Your physician recommends that you continue on your current medications as directed. Please refer to the Current Medication list given to you today.  *If you need a refill on your cardiac medications before your next appointment, please call your pharmacy*   Lab Work: NONE If you have labs (blood work) drawn today and your tests are completely normal, you will receive your results only by: MyChart Message (if you have MyChart) OR A paper copy in the mail If you have any lab test  that is abnormal or we need to change your treatment, we will call you to review the results.   Testing/Procedures: NONE   Follow-Up: At [facility], you and your health needs are our priority.  As part of our continuing mission to provide you with exceptional heart care, we have created designated Provider Care Teams.  These Care Teams include your primary Cardiologist (physician) and Advanced Practice Providers (APPs -  Physician Assistants and Nurse Practitioners) who all work together to provide you with the care you need, when you need it.  We recommend signing up for the patient portal called "MyChart".  Sign up information is provided on this After Visit Summary.  MyChart is used to connect with patients for Virtual Visits (Telemedicine).  Patients are able to view lab/test results, encounter notes, upcoming appointments, etc.  Non-urgent messages can be sent to your provider as well.   To learn more about what you can do with MyChart, go to [email or web address].    Your next appointment:   1 year(s)  Provider:   Kristeen Miss, MD        Signed, Kristeen Miss, MD  08/17/2022 7:48 AM    [facility]

## 2022-08-16 ENCOUNTER — Ambulatory Visit: Payer: Medicare Other | Attending: Cardiovascular Disease | Admitting: Cardiovascular Disease

## 2022-08-16 ENCOUNTER — Encounter: Payer: Self-pay | Admitting: Cardiovascular Disease

## 2022-08-16 VITALS — BP 118/80 | HR 68 | Ht 69.0 in | Wt 231.0 lb

## 2022-08-16 DIAGNOSIS — I2510 Atherosclerotic heart disease of native coronary artery without angina pectoris: Secondary | ICD-10-CM

## 2022-08-16 DIAGNOSIS — I2584 Coronary atherosclerosis due to calcified coronary lesion: Secondary | ICD-10-CM | POA: Insufficient documentation

## 2022-08-16 DIAGNOSIS — E782 Mixed hyperlipidemia: Secondary | ICD-10-CM | POA: Diagnosis not present

## 2022-08-16 LAB — T4: T4, Total: 6.2 ug/dL (ref 4.5–12.0)

## 2022-08-16 NOTE — Patient Instructions (Signed)
Medication Instructions:  Your physician recommends that you continue on your current medications as directed. Please refer to the Current Medication list given to you today.  *If you need a refill on your cardiac medications before your next appointment, please call your pharmacy*   Lab Work: NONE If you have labs (blood work) drawn today and your tests are completely normal, you will receive your results only by: MyChart Message (if you have MyChart) OR A paper copy in the mail If you have any lab test that is abnormal or we need to change your treatment, we will call you to review the results.   Testing/Procedures: NONE   Follow-Up: At [facility], you and your health needs are our priority.  As part of our continuing mission to provide you with exceptional heart care, we have created designated Provider Care Teams.  These Care Teams include your primary Cardiologist (physician) and Advanced Practice Providers (APPs -  Physician Assistants and Nurse Practitioners) who all work together to provide you with the care you need, when you need it.  We recommend signing up for the patient portal called "MyChart".  Sign up information is provided on this After Visit Summary.  MyChart is used to connect with patients for Virtual Visits (Telemedicine).  Patients are able to view lab/test results, encounter notes, upcoming appointments, etc.  Non-urgent messages can be sent to your provider as well.   To learn more about what you can do with MyChart, go to [email or web address].    Your next appointment:   1 year(s)  Provider:   Philip Nahser, MD      

## 2022-09-03 ENCOUNTER — Other Ambulatory Visit: Payer: Self-pay | Admitting: Medical Oncology

## 2022-09-03 DIAGNOSIS — C642 Malignant neoplasm of left kidney, except renal pelvis: Secondary | ICD-10-CM

## 2022-09-04 ENCOUNTER — Inpatient Hospital Stay: Payer: Medicare Other

## 2022-09-04 ENCOUNTER — Inpatient Hospital Stay (HOSPITAL_BASED_OUTPATIENT_CLINIC_OR_DEPARTMENT_OTHER): Payer: Medicare Other | Admitting: Internal Medicine

## 2022-09-04 ENCOUNTER — Other Ambulatory Visit: Payer: Self-pay

## 2022-09-04 ENCOUNTER — Inpatient Hospital Stay: Payer: Medicare Other | Attending: Oncology

## 2022-09-04 VITALS — BP 112/76 | HR 68 | Temp 97.5°F | Resp 18 | Ht 69.0 in | Wt 230.2 lb

## 2022-09-04 DIAGNOSIS — C7989 Secondary malignant neoplasm of other specified sites: Secondary | ICD-10-CM | POA: Insufficient documentation

## 2022-09-04 DIAGNOSIS — C642 Malignant neoplasm of left kidney, except renal pelvis: Secondary | ICD-10-CM | POA: Diagnosis present

## 2022-09-04 DIAGNOSIS — C7800 Secondary malignant neoplasm of unspecified lung: Secondary | ICD-10-CM | POA: Insufficient documentation

## 2022-09-04 DIAGNOSIS — R197 Diarrhea, unspecified: Secondary | ICD-10-CM | POA: Diagnosis not present

## 2022-09-04 DIAGNOSIS — Z79620 Long term (current) use of immunosuppressive biologic: Secondary | ICD-10-CM | POA: Insufficient documentation

## 2022-09-04 DIAGNOSIS — Z5112 Encounter for antineoplastic immunotherapy: Secondary | ICD-10-CM | POA: Insufficient documentation

## 2022-09-04 LAB — CBC WITH DIFFERENTIAL (CANCER CENTER ONLY)
Abs Immature Granulocytes: 0.04 10*3/uL (ref 0.00–0.07)
Basophils Absolute: 0.1 10*3/uL (ref 0.0–0.1)
Basophils Relative: 1 %
Eosinophils Absolute: 0.1 10*3/uL (ref 0.0–0.5)
Eosinophils Relative: 1 %
HCT: 43.9 % (ref 39.0–52.0)
Hemoglobin: 15 g/dL (ref 13.0–17.0)
Immature Granulocytes: 0 %
Lymphocytes Relative: 18 %
Lymphs Abs: 1.9 10*3/uL (ref 0.7–4.0)
MCH: 30.4 pg (ref 26.0–34.0)
MCHC: 34.2 g/dL (ref 30.0–36.0)
MCV: 89 fL (ref 80.0–100.0)
Monocytes Absolute: 0.7 10*3/uL (ref 0.1–1.0)
Monocytes Relative: 7 %
Neutro Abs: 7.4 10*3/uL (ref 1.7–7.7)
Neutrophils Relative %: 73 %
Platelet Count: 219 10*3/uL (ref 150–400)
RBC: 4.93 MIL/uL (ref 4.22–5.81)
RDW: 15.4 % (ref 11.5–15.5)
WBC Count: 10.3 10*3/uL (ref 4.0–10.5)
nRBC: 0 % (ref 0.0–0.2)

## 2022-09-04 LAB — CMP (CANCER CENTER ONLY)
ALT: 54 U/L — ABNORMAL HIGH (ref 0–44)
AST: 36 U/L (ref 15–41)
Albumin: 4.5 g/dL (ref 3.5–5.0)
Alkaline Phosphatase: 59 U/L (ref 38–126)
Anion gap: 8 (ref 5–15)
BUN: 21 mg/dL (ref 8–23)
CO2: 25 mmol/L (ref 22–32)
Calcium: 9 mg/dL (ref 8.9–10.3)
Chloride: 107 mmol/L (ref 98–111)
Creatinine: 1.31 mg/dL — ABNORMAL HIGH (ref 0.61–1.24)
GFR, Estimated: >60 mL/min (ref >60)
Glucose, Bld: 109 mg/dL — ABNORMAL HIGH (ref 70–99)
Potassium: 4.1 mmol/L (ref 3.5–5.1)
Sodium: 140 mmol/L (ref 135–145)
Total Bilirubin: 0.5 mg/dL (ref 0.3–1.2)
Total Protein: 7.3 g/dL (ref 6.5–8.1)

## 2022-09-04 LAB — TSH: TSH: <0.01 u[IU]/mL — ABNORMAL LOW (ref 0.350–4.500)

## 2022-09-04 MED ORDER — SODIUM CHLORIDE 0.9 % IV SOLN: Freq: Once | INTRAVENOUS | Status: AC

## 2022-09-04 MED ORDER — SODIUM CHLORIDE 0.9 % IV SOLN
200.0000 mg | Freq: Once | INTRAVENOUS | Status: AC
  Administered 2022-09-04: 200 mg via INTRAVENOUS
  Filled 2022-09-04: qty 200

## 2022-09-04 NOTE — Patient Instructions (Signed)
[facility]  Discharge Instructions: Thank you for choosing [facility] to provide your oncology and hematology care.   If you have a lab appointment with the Cancer Center, please go directly to the Cancer Center and check in at the registration area.   Wear comfortable clothing and clothing appropriate for easy access to any Portacath or PICC line.   We strive to give you quality time with your provider. You may need to reschedule your appointment if you arrive late (15 or more minutes).  Arriving late affects you and other patients whose appointments are after yours.  Also, if you miss three or more appointments without notifying the office, you may be dismissed from the clinic at the provider's discretion.      For prescription refill requests, have your pharmacy contact our office and allow 72 hours for refills to be completed.    Today you received the following chemotherapy and/or immunotherapy agents: Keytruda      To help prevent nausea and vomiting after your treatment, we encourage you to take your nausea medication as directed.  BELOW ARE SYMPTOMS THAT SHOULD BE REPORTED IMMEDIATELY: *FEVER GREATER THAN 100.4 F (38 C) OR HIGHER *CHILLS OR SWEATING *NAUSEA AND VOMITING THAT IS NOT CONTROLLED WITH YOUR NAUSEA MEDICATION *UNUSUAL SHORTNESS OF BREATH *UNUSUAL BRUISING OR BLEEDING *URINARY PROBLEMS (pain or burning when urinating, or frequent urination) *BOWEL PROBLEMS (unusual diarrhea, constipation, pain near the anus) TENDERNESS IN MOUTH AND THROAT WITH OR WITHOUT PRESENCE OF ULCERS (sore throat, sores in mouth, or a toothache) UNUSUAL RASH, SWELLING OR PAIN  UNUSUAL VAGINAL DISCHARGE OR ITCHING   Items with * indicate a potential emergency and should be followed up as soon as possible or go to the Emergency Department if any problems should occur.  Please show the CHEMOTHERAPY ALERT CARD or IMMUNOTHERAPY ALERT CARD at  check-in to the Emergency Department and triage nurse.  Should you have questions after your visit or need to cancel or reschedule your appointment, please contact [facility]  Dept: [phone]  and follow the prompts.  Office hours are 8:00 a.m. to 4:30 p.m. Monday - Friday. Please note that voicemails left after 4:00 p.m. may not be returned until the following business day.  We are closed weekends and major holidays. You have access to a nurse at all times for urgent questions. Please call the main number to the clinic Dept: [phone] and follow the prompts.   For any non-urgent questions, you may also contact your provider using MyChart. We now offer e-Visits for anyone 18 and older to request care online for non-urgent symptoms. For details visit [email or web address].   Also download the MyChart app! Go to the app store, search "MyChart", open the app, select [facility], and log in with your MyChart username and password.   

## 2022-09-04 NOTE — Progress Notes (Signed)
[facility] [phone]   [phone]  OFFICE PROGRESS NOTE  [name], [name], PA 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    PRIOR THERAPY: 1) status post robotic assisted laparoscopic left radical nephrectomy by Dr. Liliane Shi on January 05, 2020.  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  The final pathological staging was T3aN0 grade 2 tumor.     2) Status post radiation therapy to the pulmonary nodules completed March 2023.  He received 50 Gray in 5 fractions.   3) Status post cryoablation to 2 lesions in the right kidney completed on Aug 23, 2021.   4) status post endoscopic transsphenoidal pituitary resection completed by Dr. Annalee Genta on November 08, 2021.  The final pathology showed clear-cell renal cell carcinoma.   5) SRS treatment to pituitary bed completed in October 2023.  He received 5 fractions for a total of 25 Gray.  CURRENT THERAPY: Keytruda 200 Mg IV every 3 weeks started Aug 04, 2021 status post 13 cycles with axitinib 5 mg p.o. daily started February 09, 2022.  INTERVAL HISTORY: Marc [name] 67 y.o. male returns to the clinic today for follow-up visit.  The patient is feeling fine with no concerning complaints.  He denied having any current chest pain, shortness of breath, cough or hemoptysis.  He has no nausea, vomiting, but has few episodes of diarrhea with no constipation.  He has no headache or visual changes.  He has no recent weight loss or night sweats.  He continues to tolerate his treatment with Keytruda and axitinib fairly well.  He is here today for evaluation before starting cycle #14.   MEDICAL HISTORY: Past Medical History:  Diagnosis Date   Arthritis    Cancer  (HCC)    Kidney, Lung, Pituitary   Hyperlipidemia    Hypothyroidism     ALLERGIES:  has No Known Allergies.  MEDICATIONS:  Current Outpatient Medications  Medication Sig Dispense Refill   Ascorbic Acid (VITAMIN C PO) Take 1 tablet by mouth every evening.     aspirin EC 81 MG tablet Take 81 mg by mouth every evening. Swallow whole.     axitinib (INLYTA) 5 MG tablet Take 1 tablet (5 mg total) by mouth daily. 30 tablet 3   cholecalciferol (VITAMIN D3) 25 MCG (1000 UNIT) tablet Take 1,000 Units by mouth every evening.     Ferrous Sulfate (IRON SLOW RELEASE PO) Take 1 tablet by mouth every evening.     ibuprofen (ADVIL) 200 MG tablet Take 400-600 mg by mouth every 6 (six) hours as needed for moderate pain.     levothyroxine (SYNTHROID) 88 MCG tablet Take 1 tablet (88 mcg total) by mouth daily. 90 tablet 3   Multiple Vitamin (MULTI VITAMIN) TABS 1 tablet Orally Once a day for 30 day(s)     Multiple Vitamins-Minerals (MULTIVITAMIN WITH MINERALS) tablet Take 1 tablet by mouth every evening.     Omega-3 Fatty Acids (FISH OIL PO) Take 1 capsule by mouth every evening.     predniSONE (DELTASONE) 5 MG tablet Take 1.5 tablets (7.5 mg total) by mouth daily with breakfast. Pt may double up during sick days 150 tablet 3   prochlorperazine (COMPAZINE) 10 MG tablet Take 1 tablet (10  mg total) by mouth every 6 (six) hours as needed for nausea or vomiting. 30 tablet 0   rosuvastatin (CRESTOR) 20 MG tablet Take 1 tablet (20 mg total) by mouth daily. 90 tablet 3   Testosterone (ANDROGEL) 20.25 MG/1.25GM (1.62%) GEL Place 2 Pump onto the skin as directed. 150 g 5   vitamin E 180 MG (400 UNITS) capsule Take 400 Units by mouth every evening.     No current facility-administered medications for this visit.    SURGICAL HISTORY:  Past Surgical History:  Procedure Laterality Date   CRANIOTOMY N/A 11/08/2021   Procedure: ENDOSCOPIC ENDONASAL RESECTION OF SELLAR MASS;  Surgeon: Jadene Pierini, MD;  Location:  [facility] OR;  Service: Neurosurgery;  Laterality: N/A;  RM 20   IR RADIOLOGIST EVAL & MGMT  07/14/2021   IR RADIOLOGIST EVAL & MGMT  07/27/2021   IR RADIOLOGIST EVAL & MGMT  01/30/2022   RADIOFREQUENCY ABLATION N/A 08/23/2021   Procedure: RENAL CRYO ABLATION;  Surgeon: Berdine Dance, MD;  Location: [facility];  Service: Anesthesiology;  Laterality: N/A;   right shoulder rotator cuff repair Right    ROBOT ASSISTED LAPAROSCOPIC NEPHRECTOMY Left 01/05/2020   Procedure: XI ROBOTIC ASSISTED LAPAROSCOPIC RADICAL NEPHRECTOMY;  Surgeon: Rene Paci, MD;  Location: [facility];  Service: Urology;  Laterality: Left;   TOTAL HIP ARTHROPLASTY Left 03/31/2020   Procedure: LEFT TOTAL HIP ARTHROPLASTY ANTERIOR APPROACH;  Surgeon: Kathryne Hitch, MD;  Location: [facility] OR;  Service: Orthopedics;  Laterality: Left;   TRANSPHENOIDAL APPROACH EXPOSURE N/A 11/08/2021   Procedure: TRANSPHENOIDAL APPROACH EXPOSURE;  Surgeon: Osborn Coho, MD;  Location: [facility] OR;  Service: ENT;  Laterality: N/A;    REVIEW OF SYSTEMS:  A comprehensive review of systems was negative except for: Gastrointestinal: positive for diarrhea   PHYSICAL EXAMINATION: General appearance: alert, cooperative, and no distress Head: Normocephalic, without obvious abnormality, atraumatic Neck: no adenopathy, no JVD, supple, symmetrical, trachea midline, and thyroid not enlarged, symmetric, no tenderness/mass/nodules Lymph nodes: Cervical, supraclavicular, and axillary nodes normal. Resp: clear to auscultation bilaterally Back: symmetric, no curvature. ROM normal. No CVA tenderness. Cardio: regular rate and rhythm, S1, S2 normal, no murmur, click, rub or gallop GI: soft, non-tender; bowel sounds normal; no masses,  no organomegaly Extremities: extremities normal, atraumatic, no cyanosis or edema  ECOG PERFORMANCE STATUS: 1 - Symptomatic but completely ambulatory  Blood pressure 112/76, pulse 68, temperature (!) 97.5 F (36.4 C), temperature  source Oral, resp. rate 18, height 5\' 9"  (1.753 m), weight 230 lb 3.2 oz (104.4 kg), SpO2 95 %.  LABORATORY DATA: Lab Results  Component Value Date   WBC 10.3 09/04/2022   HGB 15.0 09/04/2022   HCT 43.9 09/04/2022   MCV 89.0 09/04/2022   PLT 219 09/04/2022      Chemistry      Component Value Date/Time   NA 139 08/14/2022 0918   NA 140 08/09/2022 1014   K 4.2 08/14/2022 0918   CL 108 08/14/2022 0918   CO2 25 08/14/2022 0918   BUN 18 08/14/2022 0918   BUN 18 08/09/2022 1014   CREATININE 1.23 08/14/2022 0918      Component Value Date/Time   CALCIUM 8.8 (L) 08/14/2022 0918   ALKPHOS 63 08/14/2022 0918   AST 31 08/14/2022 0918   ALT 41 08/14/2022 0918   BILITOT 0.5 08/14/2022 0918       RADIOGRAPHIC STUDIES: No results found.  ASSESSMENT AND PLAN: This is a very pleasant 67 years old white male diagnosed with stage  IV clear-cell renal cell carcinoma with pulmonary involvement as well as pituitary metastasis in 2023.  The patient was initially diagnosed as stage III and October 2021 status post robotic assisted laparoscopic left radical nephrectomy followed by radiation therapy to the pulmonary nodules in March 2023 followed by cryoablation to 2 lesions in the right kidney completed in May 2023.  He is also status post endoscopic transsphenoidal pituitary resection on November 08, 2021.  He is also status post SRS to the pituitary bed in October 2023. The patient is currently undergoing treatment with Keytruda 200 Mg IV every 3 weeks status post 13 cycles.  This was started on Aug 04, 2021.  He is also on axitinib 5 mg p.o. daily since November 2023. The patient has been tolerating this treatment well except for the diarrhea improved with Imodium. I recommended for him to proceed with cycle #14 today as planned. I will see him back for follow-up visit in 3 weeks for evaluation before the next cycle of his treatment. The patient was advised to call immediately if he has any concerning  symptoms in the interval. The patient voices understanding of current disease status and treatment options and is in agreement with the current care plan.  All questions were answered. The patient knows to call the clinic with any problems, questions or concerns. We can certainly see the patient much sooner if necessary.  The total time spent in the appointment was 20 minutes.  Disclaimer: This note was dictated with voice recognition software. Similar sounding words can inadvertently be transcribed and may not be corrected upon review.

## 2022-09-06 LAB — T4: T4, Total: 6.9 ug/dL (ref 4.5–12.0)

## 2022-09-15 IMAGING — MR MR ABDOMEN WO/W CM
18 series · 47 of 48 positions shown · IV contrast (gadavist)
Comparison: MRI 12/26/2019

CLINICAL DATA: Status post left nephrectomy for renal cell
carcinoma. Enlarging right renal mass.

EXAM:
MRI ABDOMEN WITHOUT AND WITH CONTRAST
TECHNIQUE: Multiplanar multisequence MR imaging of the abdomen was performed
both before and after the administration of intravenous contrast.
CONTRAST:  9mL GADAVIST GADOBUTROL 1 MMOL/ML IV SOLN

[Series 3: T2 · coronal · 6.0mm · 1.56mm/px · 2 of 30 slices shown (1 of 2)]
[im 1/30]
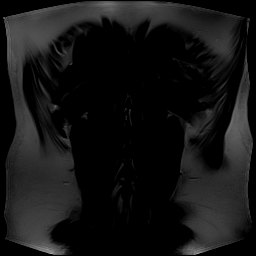
[im 30/30]
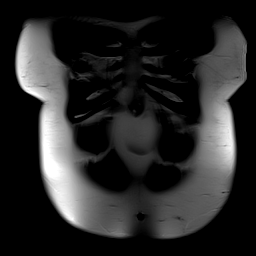

[Series 4: T2 fat-sat · axial · 6.0mm · 1.25mm/px · z∈[-115,+180]mm · 3 of 42 slices shown]
[im 1/42]
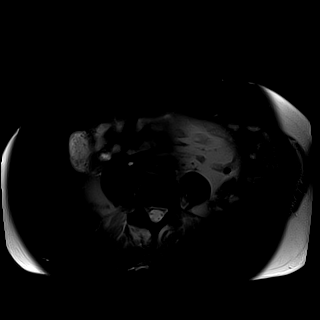
[im 21/42]
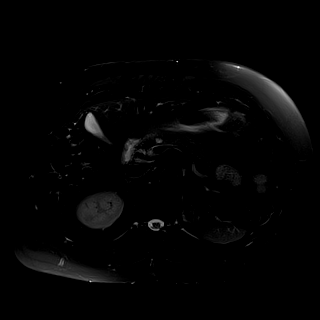
[im 42/42]
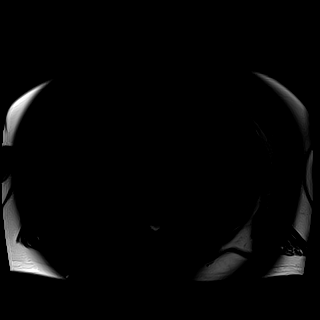

[Series 6: T1 · axial · 3.0mm · 1.25mm/px · z∈[-138,+123]mm · 3 of 88 slices shown (1 of 2)]
[im 1/88]
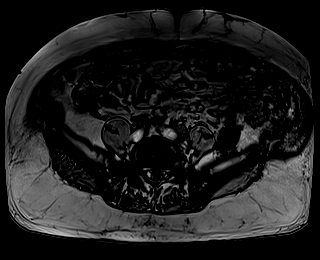
[im 44/88]
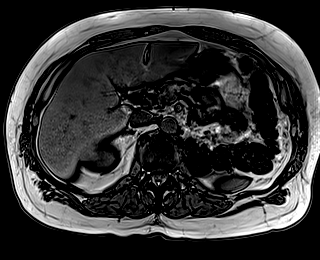
[im 88/88]
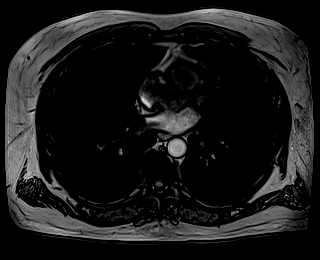

[Series 7: T1 · axial · 3.0mm · 1.25mm/px · z∈[-138,+123]mm · 3 of 88 slices shown (2 of 2)]
[im 1/88]
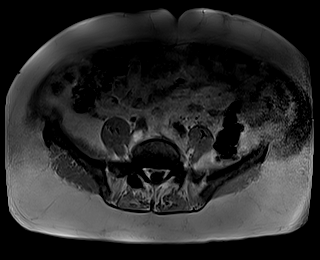
[im 44/88]
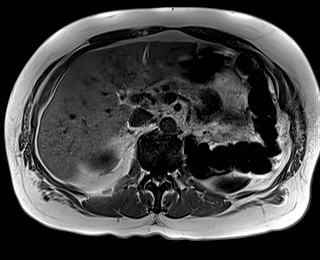
[im 88/88]
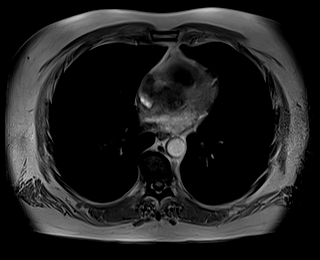

[Series 8: DWI · axial · 6.0mm · 1.49mm/px · z∈[-91,+161]mm · 3 of 72 slices shown (1 of 2)]
[im 1/72]
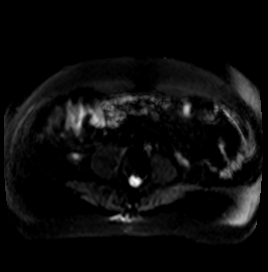
[im 36/72]
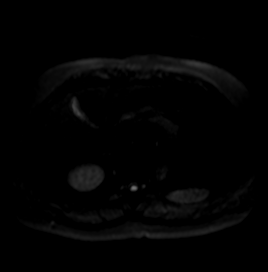
[im 72/72]
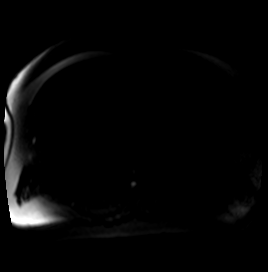

[Series 9: DWI · axial · 6.0mm · 1.49mm/px · 1 of 36 slices shown (2 of 2)]
[im 1/36]
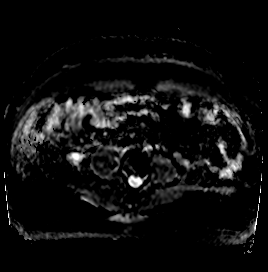

[Series 10: bSSFP · axial · 5.0mm · 0.84mm/px · z∈[-143,+126]mm · 2 of 50 slices shown]
[im 1/50]
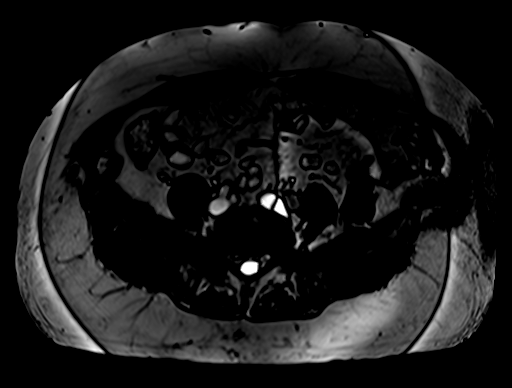
[im 50/50]
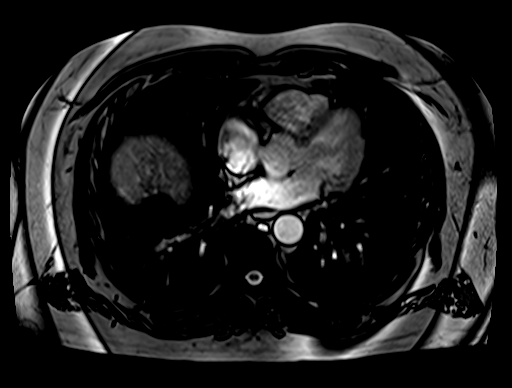

[Series 12: T1 dynamic · axial · 3.0mm · 1.25mm/px · z∈[-139,+122]mm · 3 of 88 slices shown (1 of 6)]
[im 1/88]
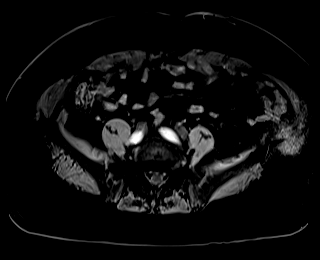
[im 44/88]
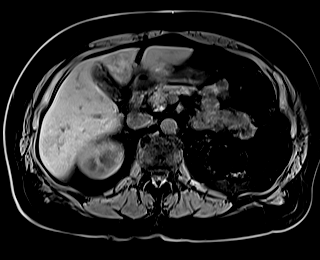
[im 88/88]
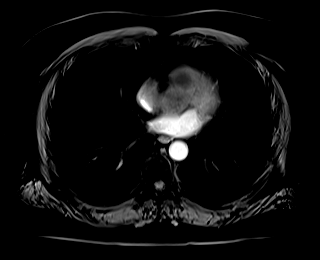

[Series 15: T1 dynamic · axial · 3.0mm · 1.25mm/px · z∈[-139,+122]mm · 3 of 88 slices shown (2 of 6)]
[im 1/88]
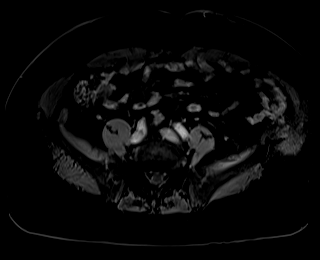
[im 44/88]
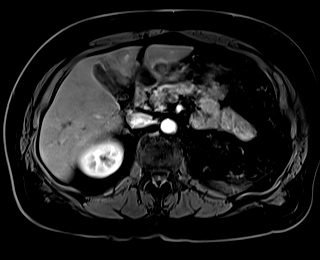
[im 88/88]
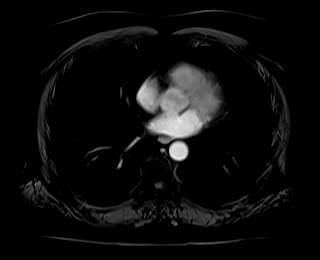

[Series 17: T1 dynamic · axial · 3.0mm · 1.25mm/px · z∈[-139,+122]mm · 3 of 88 slices shown (3 of 6)]
[im 1/88]
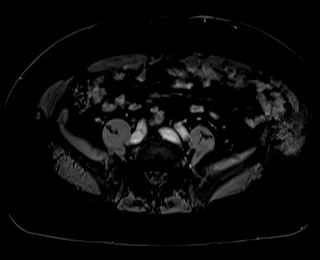
[im 44/88]
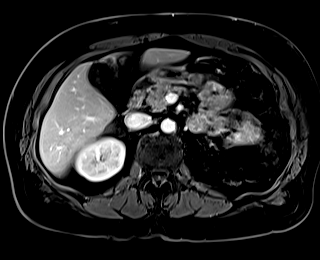
[im 88/88]
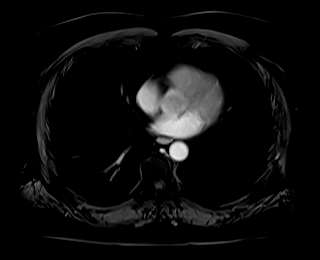

[Series 19: T1 dynamic · axial · 3.0mm · 1.25mm/px · z∈[-139,+122]mm · 3 of 88 slices shown (4 of 6)]
[im 1/88]
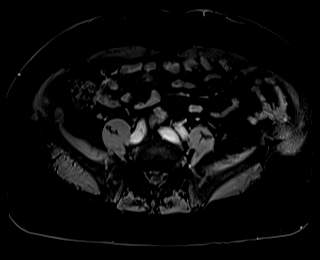
[im 44/88]
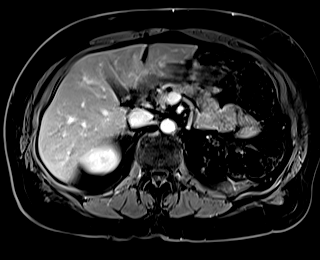
[im 88/88]
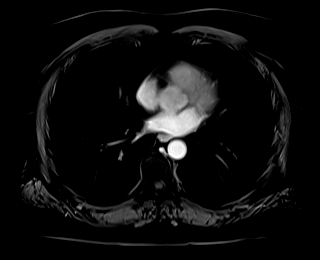

[Series 21: T1 dynamic · coronal · 3.0mm · 1.41mm/px · 3 of 72 slices shown (5 of 6)]
[im 1/72]
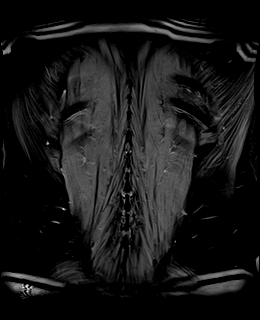
[im 36/72]
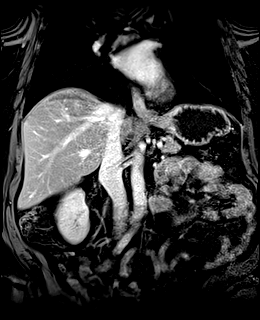
[im 72/72]
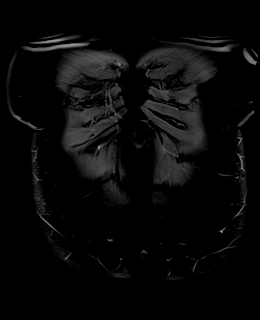

[Series 22: T2 · axial · 6.0mm · 1.56mm/px · 1 of 40 slices shown (2 of 2)]
[im 1/40]
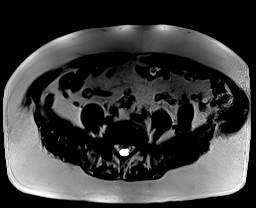

[Series 24: T1 dynamic · axial · 3.0mm · 1.25mm/px · z∈[-139,+122]mm · 3 of 88 slices shown (6 of 6)]
[im 1/88]
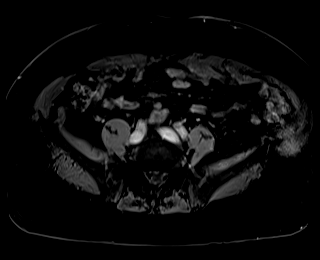
[im 44/88]
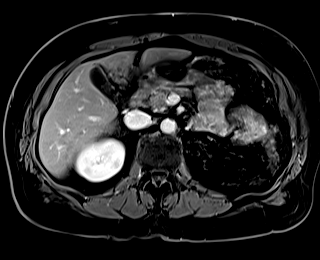
[im 88/88]
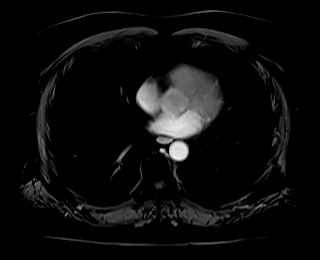

[Series 100: 30 sec sub · axial · 3.0mm · 1.25mm/px · z∈[-139,+122]mm · 3 of 88 slices shown]
[im 1/88]
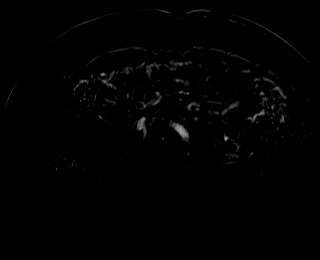
[im 44/88]
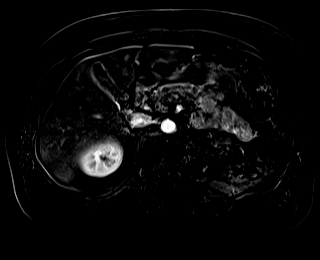
[im 88/88]
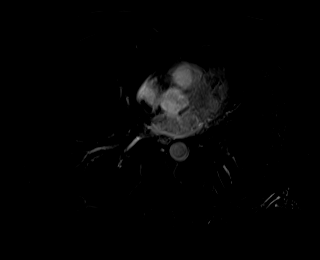

[Series 101: 60 sec sub · axial · 3.0mm · 1.25mm/px · z∈[-139,+122]mm · 3 of 88 slices shown]
[im 1/88]
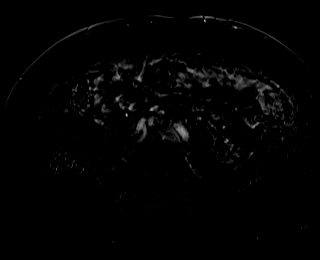
[im 44/88]
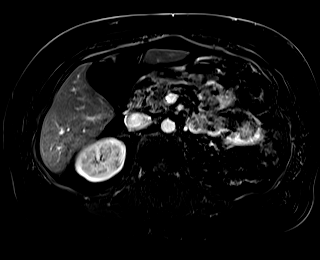
[im 88/88]
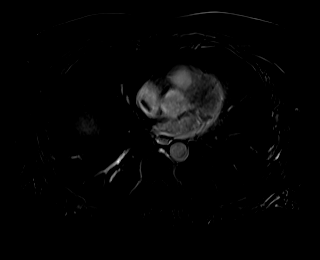

[Series 102: 90 sec sub · axial · 3.0mm · 1.25mm/px · z∈[-139,+122]mm · 3 of 88 slices shown]
[im 1/88]
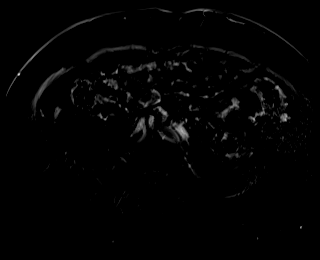
[im 44/88]
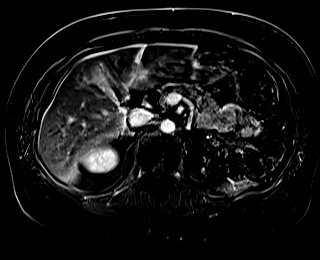
[im 88/88]
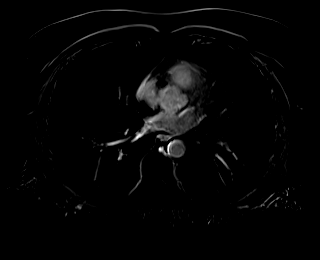

[Series 103: 3 min sub · axial · 3.0mm · 1.25mm/px · z∈[-139,-10]mm · 2 of 88 slices shown]
[im 1/88]
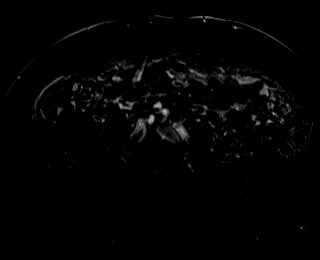
[im 44/88]
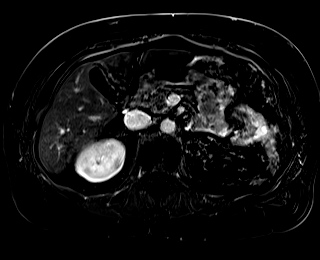

[47 of 48 positions shown; findings below may reference images not displayed]

FINDINGS: Lower chest: Right lung base nodule is identified measuring 1.2 cm.
This is new when compared with 12/26/2019 but appears unchanged from
05/25/2021. No pleural effusion.

Hepatobiliary: Mild hepatic steatosis. No focal enhancing liver
lesions. The gallbladder appears normal. No biliary dilatation.

Pancreas: Cystic lesion within the neck of pancreas measures 9 mm,
image [DATE]. This is compared with 6 mm on 12/26/2019. No pancreatic
inflammation or main duct dilatation.

Spleen:  Within normal limits in size and appearance.

Adrenals/Urinary Tract: Status post left nephrectomy and left
adrenalectomy.

- solid enhancing right kidney lesion within the medial cortex of
the upper pole measures 1.7 x 1.6 cm, image 49/2. This is increased
from 0.8 x 0.9 cm on 12/26/2019.

-Within the posterior cortex of the upper pole of right kidney there
is a new lesion measuring 1.0 x 0.9 cm, image 48/2. This is slightly
T2 hypointense, and T1 isointense. This also appears to enhance on
the postcontrast images and is also concerning for small renal cell
carcinoma.

-A third lesion is identified within the posteromedial cortex of the
interpolar right kidney measuring 1.3 x 0.9 cm, image 53/24. This
contains a focal internal area of enhancing septation which measures
3 mm in thickness, image 54/101.

-No additional kidney lesions.  No right-sided hydronephrosis.

Stomach/Bowel: Visualized portions within the abdomen are
unremarkable.

Vascular/Lymphatic: No pathologically enlarged lymph nodes
identified. No abdominal aortic aneurysm demonstrated.

Other:  None.

Musculoskeletal: No suspicious bone lesions identified.
IMPRESSION: 1. Mild interval increase in size of previously characterized solid
enhancing lesion within the medial cortex of the upper pole of the
right kidney. Suspicious for renal cell carcinoma.
2. There is a new lesion within the posterior cortex of the upper
pole of right kidney measuring 1.0 x 0.9 cm. This is also concerning
for small renal cell carcinoma.
3. There is a third lesion within the posteromedial cortex of the
interpolar right kidney measuring 1.3 x 0.9 cm. This contains a
focal area of enhancing septation which measures 3 mm in thickness.
This is consistent with a Bosniak class 2F lesion. Follow-up imaging
in 6 months is recommended.
4. Status post left nephrectomy and left adrenalectomy.
5. No evidence for metastatic disease within the abdomen.
6. Mild hepatic steatosis.
7. Cystic lesion within the neck of pancreas is slightly increased
in size from 6 mm on 12/26/2019. According to consensus criteria
follow-up imaging with pancreas protocol MRI in 12 months is
advised. This recommendation follows ACR consensus guidelines:
Management of Incidental Pancreatic Cysts: A White Paper of the ACR
Incidental Findings Committee. [HOSPITAL] 3576;[DATE].
8. Right lung base nodule measuring 1.2 cm is new when compared with
12/26/2019 but appears unchanged from 05/25/2021.

## 2022-09-18 ENCOUNTER — Encounter: Payer: Self-pay | Admitting: Internal Medicine

## 2022-09-22 ENCOUNTER — Other Ambulatory Visit: Payer: Self-pay | Admitting: Internal Medicine

## 2022-09-22 DIAGNOSIS — C642 Malignant neoplasm of left kidney, except renal pelvis: Secondary | ICD-10-CM

## 2022-09-25 ENCOUNTER — Inpatient Hospital Stay: Payer: Medicare Other

## 2022-09-25 ENCOUNTER — Other Ambulatory Visit: Payer: Self-pay

## 2022-09-25 ENCOUNTER — Inpatient Hospital Stay (HOSPITAL_BASED_OUTPATIENT_CLINIC_OR_DEPARTMENT_OTHER): Payer: Medicare Other | Admitting: Internal Medicine

## 2022-09-25 VITALS — BP 126/81 | HR 60 | Temp 97.3°F | Resp 16 | Ht 69.0 in | Wt 230.1 lb

## 2022-09-25 DIAGNOSIS — C642 Malignant neoplasm of left kidney, except renal pelvis: Secondary | ICD-10-CM

## 2022-09-25 DIAGNOSIS — Z5112 Encounter for antineoplastic immunotherapy: Secondary | ICD-10-CM | POA: Diagnosis not present

## 2022-09-25 LAB — CBC WITH DIFFERENTIAL (CANCER CENTER ONLY)
Abs Immature Granulocytes: 0.04 10*3/uL (ref 0.00–0.07)
Basophils Absolute: 0.1 10*3/uL (ref 0.0–0.1)
Basophils Relative: 1 %
Eosinophils Absolute: 0.2 10*3/uL (ref 0.0–0.5)
Eosinophils Relative: 2 %
HCT: 43 % (ref 39.0–52.0)
Hemoglobin: 14.6 g/dL (ref 13.0–17.0)
Immature Granulocytes: 0 %
Lymphocytes Relative: 24 %
Lymphs Abs: 2.9 10*3/uL (ref 0.7–4.0)
MCH: 30.7 pg (ref 26.0–34.0)
MCHC: 34 g/dL (ref 30.0–36.0)
MCV: 90.5 fL (ref 80.0–100.0)
Monocytes Absolute: 1 10*3/uL (ref 0.1–1.0)
Monocytes Relative: 8 %
Neutro Abs: 7.9 10*3/uL — ABNORMAL HIGH (ref 1.7–7.7)
Neutrophils Relative %: 65 %
Platelet Count: 195 10*3/uL (ref 150–400)
RBC: 4.75 MIL/uL (ref 4.22–5.81)
RDW: 15.7 % — ABNORMAL HIGH (ref 11.5–15.5)
WBC Count: 12.2 10*3/uL — ABNORMAL HIGH (ref 4.0–10.5)
nRBC: 0 % (ref 0.0–0.2)

## 2022-09-25 LAB — CMP (CANCER CENTER ONLY)
ALT: 45 U/L — ABNORMAL HIGH (ref 0–44)
AST: 35 U/L (ref 15–41)
Albumin: 4.1 g/dL (ref 3.5–5.0)
Alkaline Phosphatase: 58 U/L (ref 38–126)
Anion gap: 6 (ref 5–15)
BUN: 19 mg/dL (ref 8–23)
CO2: 26 mmol/L (ref 22–32)
Calcium: 9.2 mg/dL (ref 8.9–10.3)
Chloride: 108 mmol/L (ref 98–111)
Creatinine: 1.43 mg/dL — ABNORMAL HIGH (ref 0.61–1.24)
GFR, Estimated: 54 mL/min — ABNORMAL LOW (ref >60)
Glucose, Bld: 88 mg/dL (ref 70–99)
Potassium: 3.8 mmol/L (ref 3.5–5.1)
Sodium: 140 mmol/L (ref 135–145)
Total Bilirubin: 0.4 mg/dL (ref 0.3–1.2)
Total Protein: 6.9 g/dL (ref 6.5–8.1)

## 2022-09-25 MED ORDER — SODIUM CHLORIDE 0.9 % IV SOLN: Freq: Once | INTRAVENOUS | Status: AC

## 2022-09-25 MED ORDER — SODIUM CHLORIDE 0.9 % IV SOLN
200.0000 mg | Freq: Once | INTRAVENOUS | Status: AC
  Administered 2022-09-25: 200 mg via INTRAVENOUS
  Filled 2022-09-25: qty 200

## 2022-09-25 MED ORDER — HEPARIN SOD (PORK) LOCK FLUSH 100 UNIT/ML IV SOLN: 500.0000 [IU] | Freq: Once | INTRAVENOUS | Status: DC | PRN

## 2022-09-25 MED ORDER — SODIUM CHLORIDE 0.9% FLUSH: 10.0000 mL | INTRAVENOUS | Status: DC | PRN

## 2022-09-25 NOTE — Progress Notes (Signed)
[facility] Telephone:(336) [phone]   Fax:([phone]  OFFICE PROGRESS NOTE  [name], [name], [place] 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    PRIOR THERAPY: 1) status post robotic assisted laparoscopic left radical nephrectomy by Dr. Liliane Shi on January 05, 2020.  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  The final pathological staging was T3aN0 grade 2 tumor.     2) Status post radiation therapy to the pulmonary nodules completed March 2023.  He received 50 Gray in 5 fractions.   3) Status post cryoablation to 2 lesions in the right kidney completed on Aug 23, 2021.   4) status post endoscopic transsphenoidal pituitary resection completed by Dr. Annalee Genta on November 08, 2021.  The final pathology showed clear-cell renal cell carcinoma.   5) SRS treatment to pituitary bed completed in October 2023.  He received 5 fractions for a total of 25 Gray.  CURRENT THERAPY: Keytruda 200 Mg IV every 3 weeks started Aug 04, 2021 status post 14 cycles with axitinib 5 mg p.o. daily started February 09, 2022.  INTERVAL HISTORY: Marc [name] 67 y.o. male returns to the clinic today for follow-up visit.  The patient is feeling fine today with no concerning complaints except for the intermittent diarrhea improved with Imodium.  He denied having any significant chest pain, shortness of breath, cough or hemoptysis.  He has no nausea, vomiting, abdominal pain or constipation.  He has no itching or skin rash.  He has no recent weight loss or night sweats.  He is here today for evaluation before starting cycle #15 of his treatment.  MEDICAL HISTORY: Past Medical History:  Diagnosis Date   Arthritis    Cancer (HCC)    Kidney, Lung,  Pituitary   Hyperlipidemia    Hypothyroidism     ALLERGIES:  has No Known Allergies.  MEDICATIONS:  Current Outpatient Medications  Medication Sig Dispense Refill   Ascorbic Acid (VITAMIN C PO) Take 1 tablet by mouth every evening.     aspirin EC 81 MG tablet Take 81 mg by mouth every evening. Swallow whole.     cholecalciferol (VITAMIN D3) 25 MCG (1000 UNIT) tablet Take 1,000 Units by mouth every evening.     Ferrous Sulfate (IRON SLOW RELEASE PO) Take 1 tablet by mouth every evening.     ibuprofen (ADVIL) 200 MG tablet Take 400-600 mg by mouth every 6 (six) hours as needed for moderate pain.     INLYTA 5 MG tablet TAKE 1 TABLET BY MOUTH DAILY 30 tablet 3   levothyroxine (SYNTHROID) 88 MCG tablet Take 1 tablet (88 mcg total) by mouth daily. 90 tablet 3   Multiple Vitamin (MULTI VITAMIN) TABS 1 tablet Orally Once a day for 30 day(s)     Multiple Vitamins-Minerals (MULTIVITAMIN WITH MINERALS) tablet Take 1 tablet by mouth every evening.     Omega-3 Fatty Acids (FISH OIL PO) Take 1 capsule by mouth every evening.     predniSONE (DELTASONE) 5 MG tablet Take 1.5 tablets (7.5 mg total) by mouth daily with breakfast. Pt may double up during sick days 150 tablet 3   prochlorperazine (COMPAZINE) 10 MG tablet Take 1 tablet (10 mg total) by mouth every 6 (six) hours as needed for  nausea or vomiting. 30 tablet 0   rosuvastatin (CRESTOR) 20 MG tablet Take 1 tablet (20 mg total) by mouth daily. 90 tablet 3   Testosterone (ANDROGEL) 20.25 MG/1.25GM (1.62%) GEL Place 2 Pump onto the skin as directed. 150 g 5   vitamin E 180 MG (400 UNITS) capsule Take 400 Units by mouth every evening.     No current facility-administered medications for this visit.    SURGICAL HISTORY:  Past Surgical History:  Procedure Laterality Date   CRANIOTOMY N/A 11/08/2021   Procedure: ENDOSCOPIC ENDONASAL RESECTION OF SELLAR MASS;  Surgeon: Jadene Pierini, MD;  Location: [facility];  Service: Neurosurgery;  Laterality: N/A;   RM 20   IR RADIOLOGIST EVAL & MGMT  07/14/2021   IR RADIOLOGIST EVAL & MGMT  07/27/2021   IR RADIOLOGIST EVAL & MGMT  01/30/2022   RADIOFREQUENCY ABLATION N/A 08/23/2021   Procedure: RENAL CRYO ABLATION;  Surgeon: Berdine Dance, MD;  Location: [facility];  Service: Anesthesiology;  Laterality: N/A;   right shoulder rotator cuff repair Right    ROBOT ASSISTED LAPAROSCOPIC NEPHRECTOMY Left 01/05/2020   Procedure: XI ROBOTIC ASSISTED LAPAROSCOPIC RADICAL NEPHRECTOMY;  Surgeon: Rene Paci, MD;  Location: [facility];  Service: Urology;  Laterality: Left;   TOTAL HIP ARTHROPLASTY Left 03/31/2020   Procedure: LEFT TOTAL HIP ARTHROPLASTY ANTERIOR APPROACH;  Surgeon: Kathryne Hitch, MD;  Location: [facility] OR;  Service: Orthopedics;  Laterality: Left;   TRANSPHENOIDAL APPROACH EXPOSURE N/A 11/08/2021   Procedure: TRANSPHENOIDAL APPROACH EXPOSURE;  Surgeon: Osborn Coho, MD;  Location: [facility] OR;  Service: ENT;  Laterality: N/A;    REVIEW OF SYSTEMS:  A comprehensive review of systems was negative except for: Gastrointestinal: positive for diarrhea   PHYSICAL EXAMINATION: General appearance: alert, cooperative, and no distress Head: Normocephalic, without obvious abnormality, atraumatic Neck: no adenopathy, no JVD, supple, symmetrical, trachea midline, and thyroid not enlarged, symmetric, no tenderness/mass/nodules Lymph nodes: Cervical, supraclavicular, and axillary nodes normal. Resp: clear to auscultation bilaterally Back: symmetric, no curvature. ROM normal. No CVA tenderness. Cardio: regular rate and rhythm, S1, S2 normal, no murmur, click, rub or gallop GI: soft, non-tender; bowel sounds normal; no masses,  no organomegaly Extremities: extremities normal, atraumatic, no cyanosis or edema  ECOG PERFORMANCE STATUS: 1 - Symptomatic but completely ambulatory  Blood pressure 126/81, pulse 60, temperature (!) 97.3 F (36.3 C), temperature source Temporal, resp. rate 16, height 5\' 9"  (1.753  m), weight 230 lb 1.6 oz (104.4 kg), SpO2 98 %.  LABORATORY DATA: Lab Results  Component Value Date   WBC 12.2 (H) 09/25/2022   HGB 14.6 09/25/2022   HCT 43.0 09/25/2022   MCV 90.5 09/25/2022   PLT 195 09/25/2022      Chemistry      Component Value Date/Time   NA 140 09/04/2022 1309   NA 140 08/09/2022 1014   K 4.1 09/04/2022 1309   CL 107 09/04/2022 1309   CO2 25 09/04/2022 1309   BUN 21 09/04/2022 1309   BUN 18 08/09/2022 1014   CREATININE 1.31 (H) 09/04/2022 1309      Component Value Date/Time   CALCIUM 9.0 09/04/2022 1309   ALKPHOS 59 09/04/2022 1309   AST 36 09/04/2022 1309   ALT 54 (H) 09/04/2022 1309   BILITOT 0.5 09/04/2022 1309       RADIOGRAPHIC STUDIES: No results found.  ASSESSMENT AND PLAN: This is a very pleasant 67 years old white male diagnosed with stage IV clear-cell renal cell carcinoma with pulmonary involvement as  well as pituitary metastasis in 2023.  The patient was initially diagnosed as stage III and October 2021 status post robotic assisted laparoscopic left radical nephrectomy followed by radiation therapy to the pulmonary nodules in March 2023 followed by cryoablation to 2 lesions in the right kidney completed in May 2023.  He is also status post endoscopic transsphenoidal pituitary resection on November 08, 2021.  He is also status post SRS to the pituitary bed in October 2023. The patient is currently undergoing treatment with Keytruda 200 Mg IV every 3 weeks status post 14 cycles.  This was started on Aug 04, 2021.  He is also on axitinib 5 mg p.o. daily since November 2023. The patient has been tolerating this treatment well with no concerning adverse effect except for the intermittent diarrhea improved with Imodium. I recommended for him to proceed with cycle #15 today as planned. He will come back for follow-up visit in 3 weeks for evaluation before starting cycle #16. The patient was advised to call immediately if he has any other concerning  symptoms in the interval. The patient voices understanding of current disease status and treatment options and is in agreement with the current care plan.  All questions were answered. The patient knows to call the clinic with any problems, questions or concerns. We can certainly see the patient much sooner if necessary.  The total time spent in the appointment was 20 minutes.  Disclaimer: This note was dictated with voice recognition software. Similar sounding words can inadvertently be transcribed and may not be corrected upon review.

## 2022-09-25 NOTE — Progress Notes (Signed)
This RN received CMP results via fax today from [facility] lab d/t system downtime. Results for tx today as follows:  ALT 45 AST 35 Creatinine 1.43  Total Bili 0.4

## 2022-09-26 ENCOUNTER — Encounter: Payer: Self-pay | Admitting: Internal Medicine

## 2022-09-26 LAB — TSH: TSH: 0.022 u[IU]/mL — ABNORMAL LOW (ref 0.350–4.500)

## 2022-09-27 LAB — T4: T4, Total: 5.4 ug/dL (ref 4.5–12.0)

## 2022-10-01 ENCOUNTER — Encounter: Payer: Self-pay | Admitting: Internal Medicine

## 2022-10-02 ENCOUNTER — Telehealth: Payer: Self-pay | Admitting: Internal Medicine

## 2022-10-02 NOTE — Telephone Encounter (Signed)
Called patient regarding upcoming July/August appointments, patient is notified. 

## 2022-10-16 ENCOUNTER — Inpatient Hospital Stay: Payer: Medicare Other | Attending: Oncology

## 2022-10-16 ENCOUNTER — Inpatient Hospital Stay: Payer: Medicare Other

## 2022-10-16 ENCOUNTER — Other Ambulatory Visit: Payer: Self-pay

## 2022-10-16 ENCOUNTER — Inpatient Hospital Stay (HOSPITAL_BASED_OUTPATIENT_CLINIC_OR_DEPARTMENT_OTHER): Payer: Medicare Other | Admitting: Internal Medicine

## 2022-10-16 VITALS — BP 125/86 | HR 57

## 2022-10-16 DIAGNOSIS — Z79620 Long term (current) use of immunosuppressive biologic: Secondary | ICD-10-CM | POA: Insufficient documentation

## 2022-10-16 DIAGNOSIS — R197 Diarrhea, unspecified: Secondary | ICD-10-CM | POA: Diagnosis not present

## 2022-10-16 DIAGNOSIS — C642 Malignant neoplasm of left kidney, except renal pelvis: Secondary | ICD-10-CM | POA: Insufficient documentation

## 2022-10-16 DIAGNOSIS — Z5112 Encounter for antineoplastic immunotherapy: Secondary | ICD-10-CM | POA: Insufficient documentation

## 2022-10-16 LAB — CBC WITH DIFFERENTIAL (CANCER CENTER ONLY)
Abs Immature Granulocytes: 0.02 10*3/uL (ref 0.00–0.07)
Basophils Absolute: 0.1 10*3/uL (ref 0.0–0.1)
Basophils Relative: 1 %
Eosinophils Absolute: 0.2 10*3/uL (ref 0.0–0.5)
Eosinophils Relative: 2 %
HCT: 44.1 % (ref 39.0–52.0)
Hemoglobin: 14.5 g/dL (ref 13.0–17.0)
Immature Granulocytes: 0 %
Lymphocytes Relative: 31 %
Lymphs Abs: 2.6 10*3/uL (ref 0.7–4.0)
MCH: 29.6 pg (ref 26.0–34.0)
MCHC: 32.9 g/dL (ref 30.0–36.0)
MCV: 90 fL (ref 80.0–100.0)
Monocytes Absolute: 0.7 10*3/uL (ref 0.1–1.0)
Monocytes Relative: 8 %
Neutro Abs: 4.9 10*3/uL (ref 1.7–7.7)
Neutrophils Relative %: 58 %
Platelet Count: 197 10*3/uL (ref 150–400)
RBC: 4.9 MIL/uL (ref 4.22–5.81)
RDW: 15.5 % (ref 11.5–15.5)
WBC Count: 8.4 10*3/uL (ref 4.0–10.5)
nRBC: 0 % (ref 0.0–0.2)

## 2022-10-16 LAB — CMP (CANCER CENTER ONLY)
ALT: 53 U/L — ABNORMAL HIGH (ref 0–44)
AST: 44 U/L — ABNORMAL HIGH (ref 15–41)
Albumin: 4.2 g/dL (ref 3.5–5.0)
Alkaline Phosphatase: 53 U/L (ref 38–126)
Anion gap: 7 (ref 5–15)
BUN: 19 mg/dL (ref 8–23)
CO2: 25 mmol/L (ref 22–32)
Calcium: 8.7 mg/dL — ABNORMAL LOW (ref 8.9–10.3)
Chloride: 108 mmol/L (ref 98–111)
Creatinine: 1.22 mg/dL (ref 0.61–1.24)
GFR, Estimated: >60 mL/min (ref >60)
Glucose, Bld: 90 mg/dL (ref 70–99)
Potassium: 3.7 mmol/L (ref 3.5–5.1)
Sodium: 140 mmol/L (ref 135–145)
Total Bilirubin: 0.4 mg/dL (ref 0.3–1.2)
Total Protein: 6.7 g/dL (ref 6.5–8.1)

## 2022-10-16 LAB — TSH: TSH: 0.013 u[IU]/mL — ABNORMAL LOW (ref 0.350–4.500)

## 2022-10-16 MED ORDER — SODIUM CHLORIDE 0.9 % IV SOLN
200.0000 mg | Freq: Once | INTRAVENOUS | Status: AC
  Administered 2022-10-16: 200 mg via INTRAVENOUS
  Filled 2022-10-16: qty 200

## 2022-10-16 MED ORDER — SODIUM CHLORIDE 0.9 % IV SOLN: Freq: Once | INTRAVENOUS | Status: AC

## 2022-10-16 NOTE — Patient Instructions (Signed)

## 2022-10-16 NOTE — Progress Notes (Signed)
[facility] [phone]   Fax:(336) [phone]  OFFICE PROGRESS NOTE  [name], [name], [place] 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    PRIOR THERAPY: 1) status post robotic assisted laparoscopic left radical nephrectomy by Dr. Liliane Shi on January 05, 2020.  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  The final pathological staging was T3aN0 grade 2 tumor.     2) Status post radiation therapy to the pulmonary nodules completed March 2023.  He received 50 Gray in 5 fractions.   3) Status post cryoablation to 2 lesions in the right kidney completed on Aug 23, 2021.   4) status post endoscopic transsphenoidal pituitary resection completed by Dr. Annalee Genta on November 08, 2021.  The final pathology showed clear-cell renal cell carcinoma.   5) SRS treatment to pituitary bed completed in October 2023.  He received 5 fractions for a total of 25 Gray.  CURRENT THERAPY: Keytruda 200 Mg IV every 3 weeks started Aug 04, 2021 status post 15 cycles with axitinib 5 mg p.o. daily started February 09, 2022.  INTERVAL HISTORY: Marc [name] 67 y.o. male returns to the clinic today for follow-up visit.  The patient is feeling fine today with no concerning complaints.  He denied having any current chest pain, shortness of breath, cough or hemoptysis.  He has no nausea, vomiting but has few episodes of diarrhea with no constipation or abdominal pain.  He denied having any recent weight loss or night sweats.  He is here today for evaluation before starting cycle #16 of his treatment.  MEDICAL HISTORY: Past Medical History:  Diagnosis Date   Arthritis    Cancer (HCC)    Kidney, Lung, Pituitary   Hyperlipidemia    Hypothyroidism      ALLERGIES:  has No Known Allergies.  MEDICATIONS:  Current Outpatient Medications  Medication Sig Dispense Refill   Ascorbic Acid (VITAMIN C PO) Take 1 tablet by mouth every evening.     aspirin EC 81 MG tablet Take 81 mg by mouth every evening. Swallow whole.     cholecalciferol (VITAMIN D3) 25 MCG (1000 UNIT) tablet Take 1,000 Units by mouth every evening.     Ferrous Sulfate (IRON SLOW RELEASE PO) Take 1 tablet by mouth every evening.     ibuprofen (ADVIL) 200 MG tablet Take 400-600 mg by mouth every 6 (six) hours as needed for moderate pain.     INLYTA 5 MG tablet TAKE 1 TABLET BY MOUTH DAILY 30 tablet 3   levothyroxine (SYNTHROID) 88 MCG tablet Take 1 tablet (88 mcg total) by mouth daily. 90 tablet 3   Multiple Vitamin (MULTI VITAMIN) TABS 1 tablet Orally Once a day for 30 day(s)     Multiple Vitamins-Minerals (MULTIVITAMIN WITH MINERALS) tablet Take 1 tablet by mouth every evening.     Omega-3 Fatty Acids (FISH OIL PO) Take 1 capsule by mouth every evening.     predniSONE (DELTASONE) 5 MG tablet Take 1.5 tablets (7.5 mg total) by mouth daily with breakfast. Pt may double up during sick days 150 tablet 3   prochlorperazine (COMPAZINE) 10 MG tablet Take 1 tablet (10 mg total) by mouth every 6 (six) hours as needed for nausea or vomiting. 30 tablet 0  rosuvastatin (CRESTOR) 20 MG tablet Take 1 tablet (20 mg total) by mouth daily. 90 tablet 3   Testosterone (ANDROGEL) 20.25 MG/1.25GM (1.62%) GEL Place 2 Pump onto the skin as directed. 150 g 5   vitamin E 180 MG (400 UNITS) capsule Take 400 Units by mouth every evening.     No current facility-administered medications for this visit.    SURGICAL HISTORY:  Past Surgical History:  Procedure Laterality Date   CRANIOTOMY N/A 11/08/2021   Procedure: ENDOSCOPIC ENDONASAL RESECTION OF SELLAR MASS;  Surgeon: Jadene Pierini, MD;  Location: [facility] OR;  Service: Neurosurgery;  Laterality: N/A;  RM 20   IR RADIOLOGIST EVAL & MGMT  07/14/2021    IR RADIOLOGIST EVAL & MGMT  07/27/2021   IR RADIOLOGIST EVAL & MGMT  01/30/2022   RADIOFREQUENCY ABLATION N/A 08/23/2021   Procedure: RENAL CRYO ABLATION;  Surgeon: Berdine Dance, MD;  Location: [facility];  Service: Anesthesiology;  Laterality: N/A;   right shoulder rotator cuff repair Right    ROBOT ASSISTED LAPAROSCOPIC NEPHRECTOMY Left 01/05/2020   Procedure: XI ROBOTIC ASSISTED LAPAROSCOPIC RADICAL NEPHRECTOMY;  Surgeon: Rene Paci, MD;  Location: [facility];  Service: Urology;  Laterality: Left;   TOTAL HIP ARTHROPLASTY Left 03/31/2020   Procedure: LEFT TOTAL HIP ARTHROPLASTY ANTERIOR APPROACH;  Surgeon: Kathryne Hitch, MD;  Location: [facility] OR;  Service: Orthopedics;  Laterality: Left;   TRANSPHENOIDAL APPROACH EXPOSURE N/A 11/08/2021   Procedure: TRANSPHENOIDAL APPROACH EXPOSURE;  Surgeon: Osborn Coho, MD;  Location: [facility] OR;  Service: ENT;  Laterality: N/A;    REVIEW OF SYSTEMS:  A comprehensive review of systems was negative except for: Gastrointestinal: positive for diarrhea   PHYSICAL EXAMINATION: General appearance: alert, cooperative, and no distress Head: Normocephalic, without obvious abnormality, atraumatic Neck: no adenopathy, no JVD, supple, symmetrical, trachea midline, and thyroid not enlarged, symmetric, no tenderness/mass/nodules Lymph nodes: Cervical, supraclavicular, and axillary nodes normal. Resp: clear to auscultation bilaterally Back: symmetric, no curvature. ROM normal. No CVA tenderness. Cardio: regular rate and rhythm, S1, S2 normal, no murmur, click, rub or gallop GI: soft, non-tender; bowel sounds normal; no masses,  no organomegaly Extremities: extremities normal, atraumatic, no cyanosis or edema  ECOG PERFORMANCE STATUS: 1 - Symptomatic but completely ambulatory  Blood pressure 124/84, pulse (!) 58, temperature 97.9 F (36.6 C), temperature source Oral, resp. rate 16, height 5\' 9"  (1.753 m), weight 226 lb 12.8 oz (102.9 kg), SpO2  96%.  LABORATORY DATA: Lab Results  Component Value Date   WBC 12.2 (H) 09/25/2022   HGB 14.6 09/25/2022   HCT 43.0 09/25/2022   MCV 90.5 09/25/2022   PLT 195 09/25/2022      Chemistry      Component Value Date/Time   NA 140 09/25/2022 1024   NA 140 08/09/2022 1014   K 3.8 09/25/2022 1024   CL 108 09/25/2022 1024   CO2 26 09/25/2022 1024   BUN 19 09/25/2022 1024   BUN 18 08/09/2022 1014   CREATININE 1.43 (H) 09/25/2022 1024      Component Value Date/Time   CALCIUM 9.2 09/25/2022 1024   ALKPHOS 58 09/25/2022 1024   AST 35 09/25/2022 1024   ALT 45 (H) 09/25/2022 1024   BILITOT 0.4 09/25/2022 1024       RADIOGRAPHIC STUDIES: No results found.  ASSESSMENT AND PLAN: This is a very pleasant 67 years old white male diagnosed with stage IV clear-cell renal cell carcinoma with pulmonary involvement as well as pituitary metastasis in 2023.  The patient  was initially diagnosed as stage III and October 2021 status post robotic assisted laparoscopic left radical nephrectomy followed by radiation therapy to the pulmonary nodules in March 2023 followed by cryoablation to 2 lesions in the right kidney completed in May 2023.  He is also status post endoscopic transsphenoidal pituitary resection on November 08, 2021.  He is also status post SRS to the pituitary bed in October 2023. The patient is currently undergoing treatment with Keytruda 200 Mg IV every 3 weeks status post 15 cycles.  This was started on Aug 04, 2021.  He is also on axitinib 5 mg p.o. daily since November 2023. The patient has been tolerating this treatment well except for the few episodes of diarrhea.  He takes Imodium on as-needed basis. I recommended for him to proceed with cycle #16 today as planned. I will see him back for follow-up visit in 3 weeks for evaluation with repeat CT scan of the chest, abdomen and pelvis for restaging of his disease. He was advised to call immediately if he has any other concerning symptoms in  the interval. The patient voices understanding of current disease status and treatment options and is in agreement with the current care plan.  All questions were answered. The patient knows to call the clinic with any problems, questions or concerns. We can certainly see the patient much sooner if necessary.  The total time spent in the appointment was 20 minutes.  Disclaimer: This note was dictated with voice recognition software. Similar sounding words can inadvertently be transcribed and may not be corrected upon review.

## 2022-10-18 LAB — T4: T4, Total: 6.4 ug/dL (ref 4.5–12.0)

## 2022-10-29 ENCOUNTER — Ambulatory Visit
Admission: RE | Admit: 2022-10-29 | Discharge: 2022-10-29 | Disposition: A | Discharge status: Home / Self Care | Payer: Medicare Other | Source: Ambulatory Visit | Attending: Radiation Oncology | Admitting: Radiation Oncology

## 2022-10-29 DIAGNOSIS — C7931 Secondary malignant neoplasm of brain: Secondary | ICD-10-CM

## 2022-10-29 MED ORDER — GADOPICLENOL 0.5 MMOL/ML IV SOLN
10.0000 mL | Freq: Once | INTRAVENOUS | Status: AC | PRN
  Administered 2022-10-29: 10 mL via INTRAVENOUS

## 2022-10-30 ENCOUNTER — Encounter: Payer: Self-pay | Admitting: Internal Medicine

## 2022-10-31 NOTE — Progress Notes (Signed)
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  The patient was notified in advance and gave permission to proceed with this visit format.  He tolerated radiation treatment relatively well without any ill side effects.  He had a recent posttreatment MRI brain scan on 10/29/2022 and we reviewed those results by telephone today.                              On review of systems, the patient states that he is doing well in general and remains without complaints.  He specifically denies headaches, nausea, vomiting, changes in visual or auditory acuity, dizziness, tremor or seizure activity.  He has a good energy level and has remained active.  He reports a healthy appetite and is maintaining his weight.  Overall, he is pleased with his progress to date.  He is scheduled for restaging CT C/A/P on 8/22024 and a follow up visit with Dr. Arbutus Ped 86/24 to review those results.  The current plan is to continue systemic therapy with Keytruda every 3 weeks in addition to daily oral axitinib unless there is evidence of disease progression.  ALLERGIES:  has No Known Allergies.  Meds: Current Outpatient Medications  Medication Sig Dispense Refill   Ascorbic Acid  (VITAMIN C PO) Take 1 tablet by mouth every evening.     aspirin EC 81 MG tablet Take 81 mg by mouth every evening. Swallow whole.     cholecalciferol (VITAMIN D3) 25 MCG (1000 UNIT) tablet Take 1,000 Units by mouth every evening.     Ferrous Sulfate (IRON SLOW RELEASE PO) Take 1 tablet by mouth every evening.     ibuprofen (ADVIL) 200 MG tablet Take 400-600 mg by mouth every 6 (six) hours as needed for moderate pain.     INLYTA 5 MG tablet TAKE 1 TABLET BY MOUTH DAILY 30 tablet 3   levothyroxine (SYNTHROID) 88 MCG tablet Take 1 tablet (88 mcg total) by mouth daily. 90 tablet 3   Multiple Vitamin (MULTI VITAMIN) TABS 1 tablet Orally Once a day for 30 day(s)     Multiple Vitamins-Minerals (MULTIVITAMIN WITH MINERALS) tablet Take 1 tablet by mouth every evening.     Omega-3 Fatty Acids (FISH OIL PO) Take 1 capsule by mouth every evening.     predniSONE (DELTASONE) 5 MG tablet Take 1.5 tablets (7.5 mg total) by mouth daily with breakfast. Pt may double up during sick days 150 tablet 3   prochlorperazine (COMPAZINE) 10 MG tablet Take 1 tablet (10 mg total) by mouth every 6 (six) hours as needed for nausea or vomiting. 30 tablet 0   rosuvastatin (CRESTOR) 20 MG  tablet Take 1 tablet (20 mg total) by mouth daily. 90 tablet 3   Testosterone (ANDROGEL) 20.25 MG/1.25GM (1.62%) GEL Place 2 Pump onto the skin as directed. 150 g 5   vitamin E 180 MG (400 UNITS) capsule Take 400 Units by mouth every evening.     No current facility-administered medications for this visit.    Physical Findings:  vitals were not taken for this visit.   /10 Unable to assess due to telephone follow up visit format.  Lab Findings: Lab Results  Component Value Date   WBC 8.4 10/16/2022   HGB 14.5 10/16/2022   HCT 44.1 10/16/2022   MCV 90.0 10/16/2022   PLT 197 10/16/2022     Radiographic Findings: MR Brain W Wo Contrast  Result Date: 10/31/2022 CLINICAL DATA:  Brain metastases, assess treatment response 3T SRS  Pituitary Protocol EXAM: MRI HEAD WITHOUT AND WITH CONTRAST TECHNIQUE: Multiplanar, multiecho pulse sequences of the brain and surrounding structures were obtained without and with intravenous contrast. CONTRAST:  10 mL of Vueway IV. COMPARISON:  MRI head July 26, 2022. FINDINGS: Brain: No substantial change in soft tissue in the right aspect of the sella and thickening of the infundibulum which is deviated to the right. No progressive enhancing mass. No evidence of acute infarct, acute hemorrhage, midline shift or hydrocephalus. No abnormal enhancement outside of the sella. Vascular: Major arterial flow voids are maintained at the skull base. Skull and upper cervical spine: Normal marrow signal. Sinuses/Orbits: Mild paranasal sinus mucosal thickening. No acute orbital findings. Other: No sizable mastoid effusions. IMPRESSION: No substantial change in enhancing tissue in the right aspect of the sella. Electronically Signed   By: Feliberto Harts M.D.   On: 10/31/2022 09:10    Impression/Plan: 1.  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 He has recovered well from the effects of his recent fractionated postoperative SRS treatment and remains without complaints.  His recent posttreatment MRI brain scan from 10/29/2022 shows an excellent treatment response with a stable appearance of the residual tissue and no new lesions.  Therefore, we discussed the plan to continue with serial MRI brain scans every 3 months to continue to monitor for any evidence of disease recurrence or progression.  I will continue to follow-up with him by telephone following each scan to review results and any recommendations from the multidisciplinary brain conference.  He will also continue in routine follow-up with Dr. Shirline Frees for continued management of his systemic disease.  He knows that he is welcome to call at anytime in the interim with any questions or concerns regarding his previous radiation  treatments.  I personally spent 20 minutes in this encounter including chart review, reviewing radiological studies, telephone conversation with the patient, entering orders and completing documentation.    Marguarite Arbour, PA-C

## 2022-11-01 ENCOUNTER — Ambulatory Visit
Admission: RE | Admit: 2022-11-01 | Discharge: 2022-11-01 | Disposition: A | Discharge status: Home / Self Care | Payer: Medicare Other | Source: Ambulatory Visit | Attending: Urology | Admitting: Urology

## 2022-11-01 ENCOUNTER — Encounter: Payer: Self-pay | Admitting: Urology

## 2022-11-01 VITALS — Ht 69.0 in | Wt 226.0 lb

## 2022-11-01 DIAGNOSIS — C7931 Secondary malignant neoplasm of brain: Secondary | ICD-10-CM

## 2022-11-01 NOTE — Progress Notes (Signed)
Telephone nursing appointment for patient to review most recent scan results from . I verified patient's identity x2 and began nursing interview. Patient reports doing well. No issues conveyed at this time.   Meaningful use complete.   Patient aware of their 10:00am-11/01/2022 telephone appointment w/ Ashlyn Bruning PA-C. I left my extension [phone] in case patient needs anything. Patient verbalized understanding. This concludes the nursing interview.   Patient contact [phone]     Ruel Favors, LPN

## 2022-11-02 ENCOUNTER — Other Ambulatory Visit: Payer: Self-pay | Admitting: Radiation Therapy

## 2022-11-02 ENCOUNTER — Ambulatory Visit (HOSPITAL_COMMUNITY)
Admission: RE | Admit: 2022-11-02 | Discharge: 2022-11-02 | Disposition: A | Discharge status: Home / Self Care | Payer: Medicare Other | Source: Ambulatory Visit | Attending: Internal Medicine | Admitting: Internal Medicine

## 2022-11-02 DIAGNOSIS — C642 Malignant neoplasm of left kidney, except renal pelvis: Secondary | ICD-10-CM | POA: Diagnosis not present

## 2022-11-02 DIAGNOSIS — C7931 Secondary malignant neoplasm of brain: Secondary | ICD-10-CM

## 2022-11-03 ENCOUNTER — Other Ambulatory Visit: Payer: Self-pay

## 2022-11-05 ENCOUNTER — Inpatient Hospital Stay: Payer: Medicare Other

## 2022-11-06 ENCOUNTER — Inpatient Hospital Stay: Payer: Medicare Other

## 2022-11-06 ENCOUNTER — Inpatient Hospital Stay: Payer: Medicare Other | Attending: Oncology

## 2022-11-06 ENCOUNTER — Encounter: Payer: Self-pay | Admitting: Internal Medicine

## 2022-11-06 ENCOUNTER — Inpatient Hospital Stay (HOSPITAL_BASED_OUTPATIENT_CLINIC_OR_DEPARTMENT_OTHER): Payer: Medicare Other | Admitting: Internal Medicine

## 2022-11-06 ENCOUNTER — Other Ambulatory Visit: Payer: Self-pay

## 2022-11-06 VITALS — BP 115/74 | HR 58 | Temp 97.9°F | Resp 20

## 2022-11-06 DIAGNOSIS — C7931 Secondary malignant neoplasm of brain: Secondary | ICD-10-CM | POA: Diagnosis not present

## 2022-11-06 DIAGNOSIS — Z5112 Encounter for antineoplastic immunotherapy: Secondary | ICD-10-CM | POA: Diagnosis present

## 2022-11-06 DIAGNOSIS — Z79620 Long term (current) use of immunosuppressive biologic: Secondary | ICD-10-CM | POA: Insufficient documentation

## 2022-11-06 DIAGNOSIS — Z7952 Long term (current) use of systemic steroids: Secondary | ICD-10-CM | POA: Diagnosis not present

## 2022-11-06 DIAGNOSIS — C642 Malignant neoplasm of left kidney, except renal pelvis: Secondary | ICD-10-CM | POA: Diagnosis present

## 2022-11-06 DIAGNOSIS — Z905 Acquired absence of kidney: Secondary | ICD-10-CM | POA: Diagnosis not present

## 2022-11-06 LAB — CBC WITH DIFFERENTIAL (CANCER CENTER ONLY)
Abs Immature Granulocytes: 0.04 10*3/uL (ref 0.00–0.07)
Basophils Absolute: 0.1 10*3/uL (ref 0.0–0.1)
Basophils Relative: 1 %
Eosinophils Absolute: 0.2 10*3/uL (ref 0.0–0.5)
Eosinophils Relative: 2 %
HCT: 42.7 % (ref 39.0–52.0)
Hemoglobin: 14.6 g/dL (ref 13.0–17.0)
Immature Granulocytes: 0 %
Lymphocytes Relative: 27 %
Lymphs Abs: 2.5 10*3/uL (ref 0.7–4.0)
MCH: 30.7 pg (ref 26.0–34.0)
MCHC: 34.2 g/dL (ref 30.0–36.0)
MCV: 89.7 fL (ref 80.0–100.0)
Monocytes Absolute: 0.8 10*3/uL (ref 0.1–1.0)
Monocytes Relative: 9 %
Neutro Abs: 5.5 10*3/uL (ref 1.7–7.7)
Neutrophils Relative %: 61 %
Platelet Count: 215 10*3/uL (ref 150–400)
RBC: 4.76 MIL/uL (ref 4.22–5.81)
RDW: 15.4 % (ref 11.5–15.5)
WBC Count: 9.1 10*3/uL (ref 4.0–10.5)
nRBC: 0 % (ref 0.0–0.2)

## 2022-11-06 LAB — CMP (CANCER CENTER ONLY)
ALT: 40 U/L (ref 0–44)
AST: 33 U/L (ref 15–41)
Albumin: 4.2 g/dL (ref 3.5–5.0)
Alkaline Phosphatase: 57 U/L (ref 38–126)
Anion gap: 7 (ref 5–15)
BUN: 20 mg/dL (ref 8–23)
CO2: 25 mmol/L (ref 22–32)
Calcium: 8.7 mg/dL — ABNORMAL LOW (ref 8.9–10.3)
Chloride: 107 mmol/L (ref 98–111)
Creatinine: 1.26 mg/dL — ABNORMAL HIGH (ref 0.61–1.24)
GFR, Estimated: >60 mL/min (ref >60)
Glucose, Bld: 94 mg/dL (ref 70–99)
Potassium: 3.7 mmol/L (ref 3.5–5.1)
Sodium: 139 mmol/L (ref 135–145)
Total Bilirubin: 0.4 mg/dL (ref 0.3–1.2)
Total Protein: 6.8 g/dL (ref 6.5–8.1)

## 2022-11-06 LAB — TSH: TSH: <0.01 u[IU]/mL — ABNORMAL LOW (ref 0.350–4.500)

## 2022-11-06 MED ORDER — SODIUM CHLORIDE 0.9 % IV SOLN
200.0000 mg | Freq: Once | INTRAVENOUS | Status: AC
  Administered 2022-11-06: 200 mg via INTRAVENOUS
  Filled 2022-11-06: qty 200

## 2022-11-06 MED ORDER — SODIUM CHLORIDE 0.9 % IV SOLN: Freq: Once | INTRAVENOUS | Status: AC

## 2022-11-06 NOTE — Progress Notes (Signed)
[facility] [phone]   [phone]  OFFICE PROGRESS NOTE  [name], [name], [place] 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    PRIOR THERAPY: 1) status post robotic assisted laparoscopic left radical nephrectomy by Dr. Liliane Shi on January 05, 2020.  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  The final pathological staging was T3aN0 grade 2 tumor.     2) Status post radiation therapy to the pulmonary nodules completed March 2023.  He received 50 Gray in 5 fractions.   3) Status post cryoablation to 2 lesions in the right kidney completed on Aug 23, 2021.   4) status post endoscopic transsphenoidal pituitary resection completed by Dr. Annalee Genta on November 08, 2021.  The final pathology showed clear-cell renal cell carcinoma.   5) SRS treatment to pituitary bed completed in October 2023.  He received 5 fractions for a total of 25 Gray.  CURRENT THERAPY: Keytruda 200 Mg IV every 3 weeks started Aug 04, 2021 status post 16 cycles with axitinib 5 mg p.o. daily started February 09, 2022.  INTERVAL HISTORY: Marc [name] 67 y.o. male returns to the clinic today for follow-up visit.  The patient is feeling fine today with no concerning complaints except for the few episodes of diarrhea.  He is using Imodium and currently on prednisone 7.5 mg p.o. daily.  He denied having any current chest pain, shortness of breath, cough or hemoptysis.  He has no nausea, vomiting, diarrhea or constipation.  He has no headache or visual changes.  He denied having any significant weight loss except for few pounds recently or night sweats.  He continues to tolerate his treatment with Keytruda and axitinib fairly well.  He had repeat  CT scan of the chest, abdomen and pelvis performed recently and he is here for evaluation and discussion of his scan results   MEDICAL HISTORY: Past Medical History:  Diagnosis Date   Arthritis    Cancer (HCC)    Kidney, Lung, Pituitary   Hyperlipidemia    Hypothyroidism     ALLERGIES:  has No Known Allergies.  MEDICATIONS:  Current Outpatient Medications  Medication Sig Dispense Refill   Ascorbic Acid (VITAMIN C PO) Take 1 tablet by mouth every evening.     aspirin EC 81 MG tablet Take 81 mg by mouth every evening. Swallow whole.     cholecalciferol (VITAMIN D3) 25 MCG (1000 UNIT) tablet Take 1,000 Units by mouth every evening.     Ferrous Sulfate (IRON SLOW RELEASE PO) Take 1 tablet by mouth every evening.     ibuprofen (ADVIL) 200 MG tablet Take 400-600 mg by mouth every 6 (six) hours as needed for moderate pain.     INLYTA 5 MG tablet TAKE 1 TABLET BY MOUTH DAILY 30 tablet 3   levothyroxine (SYNTHROID) 88 MCG tablet Take 1 tablet (88 mcg total) by mouth daily. 90 tablet 3   Multiple Vitamin (MULTI VITAMIN) TABS 1 tablet Orally Once a day for 30 day(s)     Multiple Vitamins-Minerals (MULTIVITAMIN WITH MINERALS) tablet Take 1 tablet by mouth every evening.     Omega-3 Fatty Acids (FISH OIL PO) Take 1 capsule by mouth every evening.     predniSONE (DELTASONE) 5 MG tablet  Take 1.5 tablets (7.5 mg total) by mouth daily with breakfast. Pt may double up during sick days 150 tablet 3   prochlorperazine (COMPAZINE) 10 MG tablet Take 1 tablet (10 mg total) by mouth every 6 (six) hours as needed for nausea or vomiting. 30 tablet 0   rosuvastatin (CRESTOR) 20 MG tablet Take 1 tablet (20 mg total) by mouth daily. 90 tablet 3   Testosterone (ANDROGEL) 20.25 MG/1.25GM (1.62%) GEL Place 2 Pump onto the skin as directed. 150 g 5   vitamin E 180 MG (400 UNITS) capsule Take 400 Units by mouth every evening.     No current facility-administered medications for this visit.    SURGICAL HISTORY:   Past Surgical History:  Procedure Laterality Date   CRANIOTOMY N/A 11/08/2021   Procedure: ENDOSCOPIC ENDONASAL RESECTION OF SELLAR MASS;  Surgeon: Jadene Pierini, MD;  Location: [facility] OR;  Service: Neurosurgery;  Laterality: N/A;  RM 20   IR RADIOLOGIST EVAL & MGMT  07/14/2021   IR RADIOLOGIST EVAL & MGMT  07/27/2021   IR RADIOLOGIST EVAL & MGMT  01/30/2022   RADIOFREQUENCY ABLATION N/A 08/23/2021   Procedure: RENAL CRYO ABLATION;  Surgeon: Berdine Dance, MD;  Location: [facility] ORS;  Service: Anesthesiology;  Laterality: N/A;   right shoulder rotator cuff repair Right    ROBOT ASSISTED LAPAROSCOPIC NEPHRECTOMY Left 01/05/2020   Procedure: XI ROBOTIC ASSISTED LAPAROSCOPIC RADICAL NEPHRECTOMY;  Surgeon: Rene Paci, MD;  Location: [facility] ORS;  Service: Urology;  Laterality: Left;   TOTAL HIP ARTHROPLASTY Left 03/31/2020   Procedure: LEFT TOTAL HIP ARTHROPLASTY ANTERIOR APPROACH;  Surgeon: Kathryne Hitch, MD;  Location: [facility] OR;  Service: Orthopedics;  Laterality: Left;   TRANSPHENOIDAL APPROACH EXPOSURE N/A 11/08/2021   Procedure: TRANSPHENOIDAL APPROACH EXPOSURE;  Surgeon: Osborn Coho, MD;  Location: [facility] OR;  Service: ENT;  Laterality: N/A;    REVIEW OF SYSTEMS:  Constitutional: positive for fatigue and weight loss Eyes: negative Ears, nose, mouth, throat, and face: negative Respiratory: negative Cardiovascular: negative Gastrointestinal: positive for diarrhea Genitourinary:negative Integument/breast: negative Hematologic/lymphatic: negative Musculoskeletal:negative Neurological: negative Behavioral/Psych: negative Endocrine: negative Allergic/Immunologic: negative   PHYSICAL EXAMINATION: General appearance: alert, cooperative, and no distress Head: Normocephalic, without obvious abnormality, atraumatic Neck: no adenopathy, no JVD, supple, symmetrical, trachea midline, and thyroid not enlarged, symmetric, no tenderness/mass/nodules Lymph nodes: Cervical,  supraclavicular, and axillary nodes normal. Resp: clear to auscultation bilaterally Back: symmetric, no curvature. ROM normal. No CVA tenderness. Cardio: regular rate and rhythm, S1, S2 normal, no murmur, click, rub or gallop GI: soft, non-tender; bowel sounds normal; no masses,  no organomegaly Extremities: extremities normal, atraumatic, no cyanosis or edema Neurologic: Alert and oriented X 3, normal strength and tone. Normal symmetric reflexes. Normal coordination and gait  ECOG PERFORMANCE STATUS: 1 - Symptomatic but completely ambulatory  Blood pressure 116/78, pulse 64, temperature 97.6 F (36.4 C), temperature source Oral, resp. rate 18, weight 224 lb 1.6 oz (101.7 kg), SpO2 98%.  LABORATORY DATA: Lab Results  Component Value Date   WBC 9.1 11/06/2022   HGB 14.6 11/06/2022   HCT 42.7 11/06/2022   MCV 89.7 11/06/2022   PLT 215 11/06/2022      Chemistry      Component Value Date/Time   NA 140 10/16/2022 0954   NA 140 08/09/2022 1014   K 3.7 10/16/2022 0954   CL 108 10/16/2022 0954   CO2 25 10/16/2022 0954   BUN 19 10/16/2022 0954   BUN 18 08/09/2022 1014   CREATININE 1.22 10/16/2022 0954  Component Value Date/Time   CALCIUM 8.7 (L) 10/16/2022 0954   ALKPHOS 53 10/16/2022 0954   AST 44 (H) 10/16/2022 0954   ALT 53 (H) 10/16/2022 0954   BILITOT 0.4 10/16/2022 0954       RADIOGRAPHIC STUDIES: CT Chest Wo Contrast  Result Date: 11/05/2022 CLINICAL DATA:  Metastatic renal cell carcinoma restaging * Tracking Code: BO * EXAM: CT CHEST, ABDOMEN AND PELVIS WITHOUT CONTRAST TECHNIQUE: Multidetector CT imaging of the chest, abdomen and pelvis was performed following the standard protocol without IV contrast. RADIATION DOSE REDUCTION: This exam was performed according to the departmental dose-optimization program which includes automated exposure control, adjustment of the mA and/or kV according to patient size and/or use of iterative reconstruction technique. COMPARISON:   07/19/2022 FINDINGS: CT CHEST FINDINGS Cardiovascular: Aortic atherosclerosis. Normal heart size. Three-vessel coronary artery calcifications. No pericardial effusion. Mediastinum/Nodes: No enlarged mediastinal, hilar, or axillary lymph nodes. Thyroid gland, trachea, and esophagus demonstrate no significant findings. Lungs/Pleura: Diminished size of a pleural soft tissue nodule about the medial right costophrenic recess measuring 2.8 x 1.5 cm, previously 3.4 x 2.3 cm (series 2, image 45). Unchanged, somewhat nodular scarring and volume loss of the right midlung (series 4, image 69) and right lower lobe abutting the diaphragm (series 4, image 89). No pleural effusion or pneumothorax. Musculoskeletal: No chest wall abnormality. No acute osseous findings. CT ABDOMEN PELVIS FINDINGS Hepatobiliary: No solid liver abnormality is seen. Small gallstones. No gallbladder wall thickening, or biliary dilatation. Pancreas: Unremarkable. No pancreatic ductal dilatation or surrounding inflammatory changes. Spleen: Normal in size without significant abnormality. Adrenals/Urinary Tract: Adrenal glands are unremarkable. Status post left nephrectomy. No suspicious soft tissue or other noncontrast evidence of locally recurrent malignancy. Unchanged noncontrast appearance of an ablation site of the posterior medial superior pole of the right kidney (series 2, image 59). Unchanged adjacent fluid attenuation lesion lesion of the medial midportion of the right kidney, most likely a renal cortical cyst although incompletely characterized (series 2, image 62). Bladder is unremarkable. Stomach/Bowel: Stomach is within normal limits. Appendix appears normal. No evidence of bowel wall thickening, distention, or inflammatory changes. Descending and sigmoid diverticulosis. Vascular/Lymphatic: Aortic atherosclerosis. No enlarged abdominal or pelvic lymph nodes. Reproductive: Evaluation of the prostate significantly limited by dense metallic streak  artifact from adjacent left hip arthroplasty. Within this limitation, no obvious abnormality. Other: No abdominal wall hernia or abnormality. No ascites. Musculoskeletal: No acute osseous findings. Status post left hip total arthroplasty. IMPRESSION: 1. Diminished size of a pleural soft tissue nodule about the medial right costophrenic recess consistent with treatment response. 2. Unchanged, somewhat nodular scarring and volume loss of the right midlung and right lung base. 3. Status post left nephrectomy. No suspicious soft tissue or other noncontrast evidence of locally recurrent malignancy. 4. Unchanged appearance of an ablation site of the posterior medial superior pole of the right kidney without noncontrast evidence of local recurrence. 5. No noncontrast evidence of lymphadenopathy or metastatic disease in the abdomen or pelvis. 6. Cholelithiasis. 7. Coronary artery disease. Aortic Atherosclerosis (ICD10-I70.0). Electronically Signed   By: Jearld Lesch M.D.   On: 11/05/2022 12:02   CT ABDOMEN PELVIS WO CONTRAST  Result Date: 11/05/2022 CLINICAL DATA:  Metastatic renal cell carcinoma restaging * Tracking Code: BO * EXAM: CT CHEST, ABDOMEN AND PELVIS WITHOUT CONTRAST TECHNIQUE: Multidetector CT imaging of the chest, abdomen and pelvis was performed following the standard protocol without IV contrast. RADIATION DOSE REDUCTION: This exam was performed according to the departmental dose-optimization program which includes  automated exposure control, adjustment of the mA and/or kV according to patient size and/or use of iterative reconstruction technique. COMPARISON:  07/19/2022 FINDINGS: CT CHEST FINDINGS Cardiovascular: Aortic atherosclerosis. Normal heart size. Three-vessel coronary artery calcifications. No pericardial effusion. Mediastinum/Nodes: No enlarged mediastinal, hilar, or axillary lymph nodes. Thyroid gland, trachea, and esophagus demonstrate no significant findings. Lungs/Pleura: Diminished size  of a pleural soft tissue nodule about the medial right costophrenic recess measuring 2.8 x 1.5 cm, previously 3.4 x 2.3 cm (series 2, image 45). Unchanged, somewhat nodular scarring and volume loss of the right midlung (series 4, image 69) and right lower lobe abutting the diaphragm (series 4, image 89). No pleural effusion or pneumothorax. Musculoskeletal: No chest wall abnormality. No acute osseous findings. CT ABDOMEN PELVIS FINDINGS Hepatobiliary: No solid liver abnormality is seen. Small gallstones. No gallbladder wall thickening, or biliary dilatation. Pancreas: Unremarkable. No pancreatic ductal dilatation or surrounding inflammatory changes. Spleen: Normal in size without significant abnormality. Adrenals/Urinary Tract: Adrenal glands are unremarkable. Status post left nephrectomy. No suspicious soft tissue or other noncontrast evidence of locally recurrent malignancy. Unchanged noncontrast appearance of an ablation site of the posterior medial superior pole of the right kidney (series 2, image 59). Unchanged adjacent fluid attenuation lesion lesion of the medial midportion of the right kidney, most likely a renal cortical cyst although incompletely characterized (series 2, image 62). Bladder is unremarkable. Stomach/Bowel: Stomach is within normal limits. Appendix appears normal. No evidence of bowel wall thickening, distention, or inflammatory changes. Descending and sigmoid diverticulosis. Vascular/Lymphatic: Aortic atherosclerosis. No enlarged abdominal or pelvic lymph nodes. Reproductive: Evaluation of the prostate significantly limited by dense metallic streak artifact from adjacent left hip arthroplasty. Within this limitation, no obvious abnormality. Other: No abdominal wall hernia or abnormality. No ascites. Musculoskeletal: No acute osseous findings. Status post left hip total arthroplasty. IMPRESSION: 1. Diminished size of a pleural soft tissue nodule about the medial right costophrenic recess  consistent with treatment response. 2. Unchanged, somewhat nodular scarring and volume loss of the right midlung and right lung base. 3. Status post left nephrectomy. No suspicious soft tissue or other noncontrast evidence of locally recurrent malignancy. 4. Unchanged appearance of an ablation site of the posterior medial superior pole of the right kidney without noncontrast evidence of local recurrence. 5. No noncontrast evidence of lymphadenopathy or metastatic disease in the abdomen or pelvis. 6. Cholelithiasis. 7. Coronary artery disease. Aortic Atherosclerosis (ICD10-I70.0). Electronically Signed   By: Jearld Lesch M.D.   On: 11/05/2022 12:02   MR Brain W Wo Contrast  Result Date: 10/31/2022 CLINICAL DATA:  Brain metastases, assess treatment response 3T SRS Pituitary Protocol EXAM: MRI HEAD WITHOUT AND WITH CONTRAST TECHNIQUE: Multiplanar, multiecho pulse sequences of the brain and surrounding structures were obtained without and with intravenous contrast. CONTRAST:  10 mL of [place]ueway IV. COMPARISON:  MRI head July 26, 2022. FINDINGS: Brain: No substantial change in soft tissue in the right aspect of the sella and thickening of the infundibulum which is deviated to the right. No progressive enhancing mass. No evidence of acute infarct, acute hemorrhage, midline shift or hydrocephalus. No abnormal enhancement outside of the sella. Vascular: Major arterial flow voids are maintained at the skull base. Skull and upper cervical spine: Normal marrow signal. Sinuses/Orbits: Mild paranasal sinus mucosal thickening. No acute orbital findings. Other: No sizable mastoid effusions. IMPRESSION: No substantial change in enhancing tissue in the right aspect of the sella. Electronically Signed   By: Feliberto Harts M.D.   On: 10/31/2022 09:10  ASSESSMENT AND PLAN: This is a very pleasant 67 years old white male diagnosed with stage IV clear-cell renal cell carcinoma with pulmonary involvement as well as pituitary  metastasis in 2023.  The patient was initially diagnosed as stage III and October 2021 status post robotic assisted laparoscopic left radical nephrectomy followed by radiation therapy to the pulmonary nodules in March 2023 followed by cryoablation to 2 lesions in the right kidney completed in May 2023.  He is also status post endoscopic transsphenoidal pituitary resection on November 08, 2021.  He is also status post SRS to the pituitary bed in October 2023. The patient is currently undergoing treatment with Keytruda 200 Mg IV every 3 weeks status post 16 cycles.  This was started on Aug 04, 2021.  He is also on axitinib 5 mg p.o. daily since November 2023. The patient is feeling fine today with no concerning complaints except for the few episodes of diarrhea.  He continues to tolerate his treatment fairly well. He had repeat CT scan of the chest, abdomen and pelvis as well as MRI of the brain performed recently.  I personally and independently reviewed the imaging studies and discussed the result with the patient today. His scan shows no concerning findings for disease progression. I recommended for him to continue his current treatment with Keytruda and axitinib as planned. I will see him back for follow-up visit in 3 weeks for evaluation before the next cycle of his treatment. The patient was advised to call immediately if he has any other concerning symptoms in the interval. The patient voices understanding of current disease status and treatment options and is in agreement with the current care plan.  All questions were answered. The patient knows to call the clinic with any problems, questions or concerns. We can certainly see the patient much sooner if necessary.  The total time spent in the appointment was 20 minutes.  Disclaimer: This note was dictated with voice recognition software. Similar sounding words can inadvertently be transcribed and may not be corrected upon review.

## 2022-11-06 NOTE — Patient Instructions (Signed)
[name]  Discharge Instructions: Thank you for choosing [name] to provide your oncology and hematology care.   If you have a lab appointment with the [name], please go directly to the [name] and check in at the registration area.   Wear comfortable clothing and clothing appropriate for easy access to any Portacath or PICC line.   We strive to give you quality time with your provider. You may need to reschedule your appointment if you arrive late (15 or more minutes).  Arriving late affects you and other patients whose appointments are after yours.  Also, if you miss three or more appointments without notifying the office, you may be dismissed from the clinic at the provider's discretion.      For prescription refill requests, have your pharmacy contact our office and allow 72 hours for refills to be completed.    Today you received the following chemotherapy and/or immunotherapy agents: Pembrolizumab Rande Lawman)   To help prevent nausea and vomiting after your treatment, we encourage you to take your nausea medication as directed.  BELOW ARE SYMPTOMS THAT SHOULD BE REPORTED IMMEDIATELY: *FEVER GREATER THAN 100.4 F (38 C) OR HIGHER *CHILLS OR SWEATING *NAUSEA AND VOMITING THAT IS NOT CONTROLLED WITH YOUR NAUSEA MEDICATION *UNUSUAL SHORTNESS OF BREATH *UNUSUAL BRUISING OR BLEEDING *URINARY PROBLEMS (pain or burning when urinating, or frequent urination) *BOWEL PROBLEMS (unusual diarrhea, constipation, pain near the anus) TENDERNESS IN MOUTH AND THROAT WITH OR WITHOUT PRESENCE OF ULCERS (sore throat, sores in mouth, or a toothache) UNUSUAL RASH, SWELLING OR PAIN  UNUSUAL VAGINAL DISCHARGE OR ITCHING   Items with * indicate a potential emergency and should be followed up as soon as possible or go to the Emergency Department if any problems should occur.  Please show the CHEMOTHERAPY ALERT CARD or IMMUNOTHERAPY ALERT  CARD at check-in to the Emergency Department and triage nurse.  Should you have questions after your visit or need to cancel or reschedule your appointment, please contact [name]  Dept: [phone]  and follow the prompts.  Office hours are 8:00 a.m. to 4:30 p.m. Monday - Friday. Please note that voicemails left after 4:00 p.m. may not be returned until the following business day.  We are closed weekends and major holidays. You have access to a nurse at all times for urgent questions. Please call the main number to the clinic Dept: [phone] and follow the prompts.   For any non-urgent questions, you may also contact your provider using MyChart. We now offer e-Visits for anyone 37 and older to request care online for non-urgent symptoms. For details visit [email or web address].   Also download the MyChart app! Go to the app store, search "MyChart", open the app, select [place], and log in with your MyChart username and password. Pembrolizumab Injection What is this medication? PEMBROLIZUMAB (PEM broe LIZ ue mab) treats some types of cancer. It works by helping your immune system slow or stop the spread of cancer cells. It is a monoclonal antibody. This medicine may be used for other purposes; ask your health care provider or pharmacist if you have questions. COMMON BRAND NAME(S): Keytruda What should I tell my care team before I take this medication? They need to know if you have any of these conditions: Allogeneic stem cell transplant (uses someone else's stem cells) Autoimmune diseases, such as Crohn disease, ulcerative colitis, lupus History of chest radiation Nervous system problems, such  as Guillain-Barre syndrome, myasthenia gravis Organ transplant An unusual or allergic reaction to pembrolizumab, other medications, foods, dyes, or preservatives Pregnant or trying to get pregnant Breast-feeding How should I use this medication? This  medication is injected into a vein. It is given by your care team in a hospital or clinic setting. A special MedGuide will be given to you before each treatment. Be sure to read this information carefully each time. Talk to your care team about the use of this medication in children. While it may be prescribed for children as young as 6 months for selected conditions, precautions do apply. Overdosage: If you think you have taken too much of this medicine contact a poison control [name] or emergency room at once. NOTE: This medicine is only for you. Do not share this medicine with others. What if I miss a dose? Keep appointments for follow-up doses. It is important not to miss your dose. Call your care team if you are unable to keep an appointment. What may interact with this medication? Interactions have not been studied. This list may not describe all possible interactions. Give your health care provider a list of all the medicines, herbs, non-prescription drugs, or dietary supplements you use. Also tell them if you smoke, drink alcohol, or use illegal drugs. Some items may interact with your medicine. What should I watch for while using this medication? Your condition will be monitored carefully while you are receiving this medication. You may need blood work while taking this medication. This medication may cause serious skin reactions. They can happen weeks to months after starting the medication. Contact your care team right away if you notice fevers or flu-like symptoms with a rash. The rash may be red or purple and then turn into blisters or peeling of the skin. You may also notice a red rash with swelling of the face, lips, or lymph nodes in your neck or under your arms. Tell your care team right away if you have any change in your eyesight. Talk to your care team if you may be pregnant. Serious birth defects can occur if you take this medication during pregnancy and for 4 months after the last  dose. You will need a negative pregnancy test before starting this medication. Contraception is recommended while taking this medication and for 4 months after the last dose. Your care team can help you find the option that works for you. Do not breastfeed while taking this medication and for 4 months after the last dose. What side effects may I notice from receiving this medication? Side effects that you should report to your care team as soon as possible: Allergic reactions--skin rash, itching, hives, swelling of the face, lips, tongue, or throat Dry cough, shortness of breath or trouble breathing Eye pain, redness, irritation, or discharge with blurry or decreased vision Heart muscle inflammation--unusual weakness or fatigue, shortness of breath, chest pain, fast or irregular heartbeat, dizziness, swelling of the ankles, feet, or hands Hormone gland problems--headache, sensitivity to light, unusual weakness or fatigue, dizziness, fast or irregular heartbeat, increased sensitivity to cold or heat, excessive sweating, constipation, hair loss, increased thirst or amount of urine, tremors or shaking, irritability Infusion reactions--chest pain, shortness of breath or trouble breathing, feeling faint or lightheaded Kidney injury (glomerulonephritis)--decrease in the amount of urine, red or dark brown urine, foamy or bubbly urine, swelling of the ankles, hands, or feet Liver injury--right upper belly pain, loss of appetite, nausea, light-colored stool, dark yellow or brown urine, yellowing  skin or eyes, unusual weakness or fatigue Pain, tingling, or numbness in the hands or feet, muscle weakness, change in vision, confusion or trouble speaking, loss of balance or coordination, trouble walking, seizures Rash, fever, and swollen lymph nodes Redness, blistering, peeling, or loosening of the skin, including inside the mouth Sudden or severe stomach pain, bloody diarrhea, fever, nausea, vomiting Side effects  that usually do not require medical attention (report to your care team if they continue or are bothersome): Bone, joint, or muscle pain Diarrhea Fatigue Loss of appetite Nausea Skin rash This list may not describe all possible side effects. Call your doctor for medical advice about side effects. You may report side effects to FDA at 1-800-FDA-1088. Where should I keep my medication? This medication is given in a hospital or clinic. It will not be stored at home. NOTE: This sheet is a summary. It may not cover all possible information. If you have questions about this medicine, talk to your doctor, pharmacist, or health care provider.  2024 Elsevier/Gold Standard (2021-08-01 00:00:00)

## 2022-11-09 ENCOUNTER — Telehealth: Payer: Self-pay | Admitting: Radiation Therapy

## 2022-11-09 NOTE — Telephone Encounter (Signed)
Opened in error

## 2022-11-27 ENCOUNTER — Inpatient Hospital Stay: Payer: Medicare Other

## 2022-11-27 ENCOUNTER — Inpatient Hospital Stay (HOSPITAL_BASED_OUTPATIENT_CLINIC_OR_DEPARTMENT_OTHER): Payer: Medicare Other | Admitting: Internal Medicine

## 2022-11-27 VITALS — BP 127/77 | HR 55

## 2022-11-27 DIAGNOSIS — C642 Malignant neoplasm of left kidney, except renal pelvis: Secondary | ICD-10-CM

## 2022-11-27 DIAGNOSIS — Z5112 Encounter for antineoplastic immunotherapy: Secondary | ICD-10-CM | POA: Diagnosis not present

## 2022-11-27 LAB — CBC WITH DIFFERENTIAL (CANCER CENTER ONLY)
Abs Immature Granulocytes: 0.03 10*3/uL (ref 0.00–0.07)
Basophils Absolute: 0.1 10*3/uL (ref 0.0–0.1)
Basophils Relative: 1 %
Eosinophils Absolute: 0.3 10*3/uL (ref 0.0–0.5)
Eosinophils Relative: 4 %
HCT: 43.4 % (ref 39.0–52.0)
Hemoglobin: 14.6 g/dL (ref 13.0–17.0)
Immature Granulocytes: 0 %
Lymphocytes Relative: 38 %
Lymphs Abs: 2.8 10*3/uL (ref 0.7–4.0)
MCH: 30.6 pg (ref 26.0–34.0)
MCHC: 33.6 g/dL (ref 30.0–36.0)
MCV: 91 fL (ref 80.0–100.0)
Monocytes Absolute: 0.6 10*3/uL (ref 0.1–1.0)
Monocytes Relative: 9 %
Neutro Abs: 3.7 10*3/uL (ref 1.7–7.7)
Neutrophils Relative %: 48 %
Platelet Count: 225 10*3/uL (ref 150–400)
RBC: 4.77 MIL/uL (ref 4.22–5.81)
RDW: 14.7 % (ref 11.5–15.5)
WBC Count: 7.5 10*3/uL (ref 4.0–10.5)
nRBC: 0 % (ref 0.0–0.2)

## 2022-11-27 LAB — CMP (CANCER CENTER ONLY)
ALT: 46 U/L — ABNORMAL HIGH (ref 0–44)
AST: 33 U/L (ref 15–41)
Albumin: 4.2 g/dL (ref 3.5–5.0)
Alkaline Phosphatase: 57 U/L (ref 38–126)
Anion gap: 7 (ref 5–15)
BUN: 15 mg/dL (ref 8–23)
CO2: 30 mmol/L (ref 22–32)
Calcium: 9.1 mg/dL (ref 8.9–10.3)
Chloride: 105 mmol/L (ref 98–111)
Creatinine: 1.18 mg/dL (ref 0.61–1.24)
GFR, Estimated: >60 mL/min (ref >60)
Glucose, Bld: 84 mg/dL (ref 70–99)
Potassium: 3.9 mmol/L (ref 3.5–5.1)
Sodium: 142 mmol/L (ref 135–145)
Total Bilirubin: 0.6 mg/dL (ref 0.3–1.2)
Total Protein: 6.8 g/dL (ref 6.5–8.1)

## 2022-11-27 LAB — TSH: TSH: <0.01 u[IU]/mL — ABNORMAL LOW (ref 0.350–4.500)

## 2022-11-27 MED ORDER — SODIUM CHLORIDE 0.9 % IV SOLN: Freq: Once | INTRAVENOUS | Status: AC

## 2022-11-27 MED ORDER — SODIUM CHLORIDE 0.9 % IV SOLN
200.0000 mg | Freq: Once | INTRAVENOUS | Status: AC
  Administered 2022-11-27: 200 mg via INTRAVENOUS
  Filled 2022-11-27: qty 200

## 2022-11-27 NOTE — Patient Instructions (Signed)
[name]  Discharge Instructions: Thank you for choosing [facility] to provide your oncology and hematology care.   If you have a lab appointment with the [facility], please go directly to the [facility] and check in at the registration area.   Wear comfortable clothing and clothing appropriate for easy access to any Portacath or PICC line.   We strive to give you quality time with your provider. You may need to reschedule your appointment if you arrive late (15 or more minutes).  Arriving late affects you and other patients whose appointments are after yours.  Also, if you miss three or more appointments without notifying the office, you may be dismissed from the clinic at the provider's discretion.      For prescription refill requests, have your pharmacy contact our office and allow 72 hours for refills to be completed.    Today you received the following chemotherapy and/or immunotherapy agents: Keytruda      To help prevent nausea and vomiting after your treatment, we encourage you to take your nausea medication as directed.  BELOW ARE SYMPTOMS THAT SHOULD BE REPORTED IMMEDIATELY: *FEVER GREATER THAN 100.4 F (38 C) OR HIGHER *CHILLS OR SWEATING *NAUSEA AND VOMITING THAT IS NOT CONTROLLED WITH YOUR NAUSEA MEDICATION *UNUSUAL SHORTNESS OF BREATH *UNUSUAL BRUISING OR BLEEDING *URINARY PROBLEMS (pain or burning when urinating, or frequent urination) *BOWEL PROBLEMS (unusual diarrhea, constipation, pain near the anus) TENDERNESS IN MOUTH AND THROAT WITH OR WITHOUT PRESENCE OF ULCERS (sore throat, sores in mouth, or a toothache) UNUSUAL RASH, SWELLING OR PAIN  UNUSUAL VAGINAL DISCHARGE OR ITCHING   Items with * indicate a potential emergency and should be followed up as soon as possible or go to the Emergency Department if any problems should occur.  Please show the CHEMOTHERAPY ALERT CARD or IMMUNOTHERAPY ALERT CARD at  check-in to the Emergency Department and triage nurse.  Should you have questions after your visit or need to cancel or reschedule your appointment, please contact [facility]  Dept: [phone]  and follow the prompts.  Office hours are 8:00 a.m. to 4:30 p.m. Monday - Friday. Please note that voicemails left after 4:00 p.m. may not be returned until the following business day.  We are closed weekends and major holidays. You have access to a nurse at all times for urgent questions. Please call the main number to the clinic Dept: [phone] and follow the prompts.   For any non-urgent questions, you may also contact your provider using MyChart. We now offer e-Visits for anyone 14 and older to request care online for non-urgent symptoms. For details visit [email or web address].   Also download the MyChart app! Go to the app store, search "MyChart", open the app, select [facility] [place], and log in with your MyChart username and password.

## 2022-11-27 NOTE — Progress Notes (Signed)
[facility] [phone]   [phone]  OFFICE PROGRESS NOTE  [name], [name], [place] 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    PRIOR THERAPY: 1) status post robotic assisted laparoscopic left radical nephrectomy by Dr. Liliane Shi on January 05, 2020.  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  The final pathological staging was T3aN0 grade 2 tumor.     2) Status post radiation therapy to the pulmonary nodules completed March 2023.  He received 50 Gray in 5 fractions.   3) Status post cryoablation to 2 lesions in the right kidney completed on Aug 23, 2021.   4) status post endoscopic transsphenoidal pituitary resection completed by Dr. Annalee Genta on November 08, 2021.  The final pathology showed clear-cell renal cell carcinoma.   5) SRS treatment to pituitary bed completed in October 2023.  He received 5 fractions for a total of 25 Gray.  CURRENT THERAPY: Keytruda 200 Mg IV every 3 weeks started Aug 04, 2021 status post 17 cycles with axitinib 5 mg p.o. daily started February 09, 2022.  INTERVAL HISTORY: Marc [name] 67 y.o. male returns to the clinic today for follow-up visit.  The patient has no complaints today except for the persistent diarrhea and he takes Imodium at regular basis.  He denied having any current chest pain, shortness of breath, cough or hemoptysis.  He has no nausea, vomiting, abdominal pain or constipation.  He has no headache or visual changes.  He lost around 6 pounds intentionally.  He denied having any fever or chills.  He is here today for evaluation before starting cycle #18 of his treatment.   MEDICAL HISTORY: Past Medical History:  Diagnosis Date   Arthritis    Cancer  (HCC)    Kidney, Lung, Pituitary   Hyperlipidemia    Hypothyroidism     ALLERGIES:  has No Known Allergies.  MEDICATIONS:  Current Outpatient Medications  Medication Sig Dispense Refill   Ascorbic Acid (VITAMIN C PO) Take 1 tablet by mouth every evening.     aspirin EC 81 MG tablet Take 81 mg by mouth every evening. Swallow whole.     cholecalciferol (VITAMIN D3) 25 MCG (1000 UNIT) tablet Take 1,000 Units by mouth every evening.     Ferrous Sulfate (IRON SLOW RELEASE PO) Take 1 tablet by mouth every evening.     ibuprofen (ADVIL) 200 MG tablet Take 400-600 mg by mouth every 6 (six) hours as needed for moderate pain.     INLYTA 5 MG tablet TAKE 1 TABLET BY MOUTH DAILY 30 tablet 3   levothyroxine (SYNTHROID) 88 MCG tablet Take 1 tablet (88 mcg total) by mouth daily. 90 tablet 3   Multiple Vitamin (MULTI VITAMIN) TABS 1 tablet Orally Once a day for 30 day(s)     Multiple Vitamins-Minerals (MULTIVITAMIN WITH MINERALS) tablet Take 1 tablet by mouth every evening.     Omega-3 Fatty Acids (FISH OIL PO) Take 1 capsule by mouth every evening.     predniSONE (DELTASONE) 5 MG tablet Take 1.5 tablets (7.5 mg total) by mouth daily with breakfast. Pt may double up during sick days 150 tablet 3   prochlorperazine (COMPAZINE) 10 MG tablet Take 1 tablet (10 mg total) by mouth every  6 (six) hours as needed for nausea or vomiting. 30 tablet 0   rosuvastatin (CRESTOR) 20 MG tablet Take 1 tablet (20 mg total) by mouth daily. 90 tablet 3   Testosterone (ANDROGEL) 20.25 MG/1.25GM (1.62%) GEL Place 2 Pump onto the skin as directed. 150 g 5   vitamin E 180 MG (400 UNITS) capsule Take 400 Units by mouth every evening.     No current facility-administered medications for this visit.    SURGICAL HISTORY:  Past Surgical History:  Procedure Laterality Date   CRANIOTOMY N/A 11/08/2021   Procedure: ENDOSCOPIC ENDONASAL RESECTION OF SELLAR MASS;  Surgeon: Jadene Pierini, MD;  Location: [facility] OR;  Service:  Neurosurgery;  Laterality: N/A;  RM 20   IR RADIOLOGIST EVAL & MGMT  07/14/2021   IR RADIOLOGIST EVAL & MGMT  07/27/2021   IR RADIOLOGIST EVAL & MGMT  01/30/2022   RADIOFREQUENCY ABLATION N/A 08/23/2021   Procedure: RENAL CRYO ABLATION;  Surgeon: Berdine Dance, MD;  Location: [facility] ORS;  Service: Anesthesiology;  Laterality: N/A;   right shoulder rotator cuff repair Right    ROBOT ASSISTED LAPAROSCOPIC NEPHRECTOMY Left 01/05/2020   Procedure: XI ROBOTIC ASSISTED LAPAROSCOPIC RADICAL NEPHRECTOMY;  Surgeon: Rene Paci, MD;  Location: [facility] ORS;  Service: Urology;  Laterality: Left;   TOTAL HIP ARTHROPLASTY Left 03/31/2020   Procedure: LEFT TOTAL HIP ARTHROPLASTY ANTERIOR APPROACH;  Surgeon: Kathryne Hitch, MD;  Location: [facility] OR;  Service: Orthopedics;  Laterality: Left;   TRANSPHENOIDAL APPROACH EXPOSURE N/A 11/08/2021   Procedure: TRANSPHENOIDAL APPROACH EXPOSURE;  Surgeon: Osborn Coho, MD;  Location: [facility];  Service: ENT;  Laterality: N/A;    REVIEW OF SYSTEMS:  A comprehensive review of systems was negative except for: Gastrointestinal: positive for diarrhea   PHYSICAL EXAMINATION: General appearance: alert, cooperative, and no distress Head: Normocephalic, without obvious abnormality, atraumatic Neck: no adenopathy, no JVD, supple, symmetrical, trachea midline, and thyroid not enlarged, symmetric, no tenderness/mass/nodules Lymph nodes: Cervical, supraclavicular, and axillary nodes normal. Resp: clear to auscultation bilaterally Back: symmetric, no curvature. ROM normal. No CVA tenderness. Cardio: regular rate and rhythm, S1, S2 normal, no murmur, click, rub or gallop GI: soft, non-tender; bowel sounds normal; no masses,  no organomegaly Extremities: extremities normal, atraumatic, no cyanosis or edema  ECOG PERFORMANCE STATUS: 1 - Symptomatic but completely ambulatory  Blood pressure 122/81, pulse 62, temperature 97.7 F (36.5 C), temperature source Oral, resp. rate  17, height 5\' 9"  (1.753 m), weight 218 lb (98.9 kg), SpO2 100%.  LABORATORY DATA: Lab Results  Component Value Date   WBC 7.5 11/27/2022   HGB 14.6 11/27/2022   HCT 43.4 11/27/2022   MCV 91.0 11/27/2022   PLT 225 11/27/2022      Chemistry      Component Value Date/Time   NA 139 11/06/2022 1053   NA 140 08/09/2022 1014   K 3.7 11/06/2022 1053   CL 107 11/06/2022 1053   CO2 25 11/06/2022 1053   BUN 20 11/06/2022 1053   BUN 18 08/09/2022 1014   CREATININE 1.26 (H) 11/06/2022 1053      Component Value Date/Time   CALCIUM 8.7 (L) 11/06/2022 1053   ALKPHOS 57 11/06/2022 1053   AST 33 11/06/2022 1053   ALT 40 11/06/2022 1053   BILITOT 0.4 11/06/2022 1053       RADIOGRAPHIC STUDIES: CT Chest Wo Contrast  Result Date: 11/05/2022 CLINICAL DATA:  Metastatic renal cell carcinoma restaging * Tracking Code: BO * EXAM: CT CHEST, ABDOMEN AND PELVIS WITHOUT  CONTRAST TECHNIQUE: Multidetector CT imaging of the chest, abdomen and pelvis was performed following the standard protocol without IV contrast. RADIATION DOSE REDUCTION: This exam was performed according to the departmental dose-optimization program which includes automated exposure control, adjustment of the mA and/or kV according to patient size and/or use of iterative reconstruction technique. COMPARISON:  07/19/2022 FINDINGS: CT CHEST FINDINGS Cardiovascular: Aortic atherosclerosis. Normal heart size. Three-vessel coronary artery calcifications. No pericardial effusion. Mediastinum/Nodes: No enlarged mediastinal, hilar, or axillary lymph nodes. Thyroid gland, trachea, and esophagus demonstrate no significant findings. Lungs/Pleura: Diminished size of a pleural soft tissue nodule about the medial right costophrenic recess measuring 2.8 x 1.5 cm, previously 3.4 x 2.3 cm (series 2, image 45). Unchanged, somewhat nodular scarring and volume loss of the right midlung (series 4, image 69) and right lower lobe abutting the diaphragm (series 4,  image 89). No pleural effusion or pneumothorax. Musculoskeletal: No chest wall abnormality. No acute osseous findings. CT ABDOMEN PELVIS FINDINGS Hepatobiliary: No solid liver abnormality is seen. Small gallstones. No gallbladder wall thickening, or biliary dilatation. Pancreas: Unremarkable. No pancreatic ductal dilatation or surrounding inflammatory changes. Spleen: Normal in size without significant abnormality. Adrenals/Urinary Tract: Adrenal glands are unremarkable. Status post left nephrectomy. No suspicious soft tissue or other noncontrast evidence of locally recurrent malignancy. Unchanged noncontrast appearance of an ablation site of the posterior medial superior pole of the right kidney (series 2, image 59). Unchanged adjacent fluid attenuation lesion lesion of the medial midportion of the right kidney, most likely a renal cortical cyst although incompletely characterized (series 2, image 62). Bladder is unremarkable. Stomach/Bowel: Stomach is within normal limits. Appendix appears normal. No evidence of bowel wall thickening, distention, or inflammatory changes. Descending and sigmoid diverticulosis. Vascular/Lymphatic: Aortic atherosclerosis. No enlarged abdominal or pelvic lymph nodes. Reproductive: Evaluation of the prostate significantly limited by dense metallic streak artifact from adjacent left hip arthroplasty. Within this limitation, no obvious abnormality. Other: No abdominal wall hernia or abnormality. No ascites. Musculoskeletal: No acute osseous findings. Status post left hip total arthroplasty. IMPRESSION: 1. Diminished size of a pleural soft tissue nodule about the medial right costophrenic recess consistent with treatment response. 2. Unchanged, somewhat nodular scarring and volume loss of the right midlung and right lung base. 3. Status post left nephrectomy. No suspicious soft tissue or other noncontrast evidence of locally recurrent malignancy. 4. Unchanged appearance of an ablation  site of the posterior medial superior pole of the right kidney without noncontrast evidence of local recurrence. 5. No noncontrast evidence of lymphadenopathy or metastatic disease in the abdomen or pelvis. 6. Cholelithiasis. 7. Coronary artery disease. Aortic Atherosclerosis (ICD10-I70.0). Electronically Signed   By: Jearld Lesch M.D.   On: 11/05/2022 12:02   CT ABDOMEN PELVIS WO CONTRAST  Result Date: 11/05/2022 CLINICAL DATA:  Metastatic renal cell carcinoma restaging * Tracking Code: BO * EXAM: CT CHEST, ABDOMEN AND PELVIS WITHOUT CONTRAST TECHNIQUE: Multidetector CT imaging of the chest, abdomen and pelvis was performed following the standard protocol without IV contrast. RADIATION DOSE REDUCTION: This exam was performed according to the departmental dose-optimization program which includes automated exposure control, adjustment of the mA and/or kV according to patient size and/or use of iterative reconstruction technique. COMPARISON:  07/19/2022 FINDINGS: CT CHEST FINDINGS Cardiovascular: Aortic atherosclerosis. Normal heart size. Three-vessel coronary artery calcifications. No pericardial effusion. Mediastinum/Nodes: No enlarged mediastinal, hilar, or axillary lymph nodes. Thyroid gland, trachea, and esophagus demonstrate no significant findings. Lungs/Pleura: Diminished size of a pleural soft tissue nodule about the medial right costophrenic recess  measuring 2.8 x 1.5 cm, previously 3.4 x 2.3 cm (series 2, image 45). Unchanged, somewhat nodular scarring and volume loss of the right midlung (series 4, image 69) and right lower lobe abutting the diaphragm (series 4, image 89). No pleural effusion or pneumothorax. Musculoskeletal: No chest wall abnormality. No acute osseous findings. CT ABDOMEN PELVIS FINDINGS Hepatobiliary: No solid liver abnormality is seen. Small gallstones. No gallbladder wall thickening, or biliary dilatation. Pancreas: Unremarkable. No pancreatic ductal dilatation or surrounding  inflammatory changes. Spleen: Normal in size without significant abnormality. Adrenals/Urinary Tract: Adrenal glands are unremarkable. Status post left nephrectomy. No suspicious soft tissue or other noncontrast evidence of locally recurrent malignancy. Unchanged noncontrast appearance of an ablation site of the posterior medial superior pole of the right kidney (series 2, image 59). Unchanged adjacent fluid attenuation lesion lesion of the medial midportion of the right kidney, most likely a renal cortical cyst although incompletely characterized (series 2, image 62). Bladder is unremarkable. Stomach/Bowel: Stomach is within normal limits. Appendix appears normal. No evidence of bowel wall thickening, distention, or inflammatory changes. Descending and sigmoid diverticulosis. Vascular/Lymphatic: Aortic atherosclerosis. No enlarged abdominal or pelvic lymph nodes. Reproductive: Evaluation of the prostate significantly limited by dense metallic streak artifact from adjacent left hip arthroplasty. Within this limitation, no obvious abnormality. Other: No abdominal wall hernia or abnormality. No ascites. Musculoskeletal: No acute osseous findings. Status post left hip total arthroplasty. IMPRESSION: 1. Diminished size of a pleural soft tissue nodule about the medial right costophrenic recess consistent with treatment response. 2. Unchanged, somewhat nodular scarring and volume loss of the right midlung and right lung base. 3. Status post left nephrectomy. No suspicious soft tissue or other noncontrast evidence of locally recurrent malignancy. 4. Unchanged appearance of an ablation site of the posterior medial superior pole of the right kidney without noncontrast evidence of local recurrence. 5. No noncontrast evidence of lymphadenopathy or metastatic disease in the abdomen or pelvis. 6. Cholelithiasis. 7. Coronary artery disease. Aortic Atherosclerosis (ICD10-I70.0). Electronically Signed   By: Jearld Lesch M.D.   On:  11/05/2022 12:02   MR Brain W Wo Contrast  Result Date: 10/31/2022 CLINICAL DATA:  Brain metastases, assess treatment response 3T SRS Pituitary Protocol EXAM: MRI HEAD WITHOUT AND WITH CONTRAST TECHNIQUE: Multiplanar, multiecho pulse sequences of the brain and surrounding structures were obtained without and with intravenous contrast. CONTRAST:  10 mL of Vueway IV. COMPARISON:  MRI head July 26, 2022. FINDINGS: Brain: No substantial change in soft tissue in the right aspect of the sella and thickening of the infundibulum which is deviated to the right. No progressive enhancing mass. No evidence of acute infarct, acute hemorrhage, midline shift or hydrocephalus. No abnormal enhancement outside of the sella. Vascular: Major arterial flow voids are maintained at the skull base. Skull and upper cervical spine: Normal marrow signal. Sinuses/Orbits: Mild paranasal sinus mucosal thickening. No acute orbital findings. Other: No sizable mastoid effusions. IMPRESSION: No substantial change in enhancing tissue in the right aspect of the sella. Electronically Signed   By: Feliberto Harts M.D.   On: 10/31/2022 09:10    ASSESSMENT AND PLAN: This is a very pleasant 67 years old white male diagnosed with stage IV clear-cell renal cell carcinoma with pulmonary involvement as well as pituitary metastasis in 2023.  The patient was initially diagnosed as stage III and October 2021 status post robotic assisted laparoscopic left radical nephrectomy followed by radiation therapy to the pulmonary nodules in March 2023 followed by cryoablation to 2 lesions in the  right kidney completed in May 2023.  He is also status post endoscopic transsphenoidal pituitary resection on November 08, 2021.  He is also status post SRS to the pituitary bed in October 2023. The patient is currently undergoing treatment with Keytruda 200 Mg IV every 3 weeks status post 17 cycles.  This was started on Aug 04, 2021.  He is also on axitinib 5 mg p.o. daily  since November 2023. The patient continues to tolerate this treatment well except for the diarrhea. He is using Imodium on as-needed basis.  I recommended for him to proceed with cycle #18 today as planned. I will see him back for follow-up visit in 3 weeks for evaluation before the next cycle of his treatment. He was advised to call immediately if he has any other concerning symptoms in the interval. The patient voices understanding of current disease status and treatment options and is in agreement with the current care plan.  All questions were answered. The patient knows to call the clinic with any problems, questions or concerns. We can certainly see the patient much sooner if necessary.  The total time spent in the appointment was 20 minutes.  Disclaimer: This note was dictated with voice recognition software. Similar sounding words can inadvertently be transcribed and may not be corrected upon review.

## 2022-11-28 ENCOUNTER — Ambulatory Visit: Payer: Medicare Other | Admitting: Internal Medicine

## 2022-11-28 ENCOUNTER — Ambulatory Visit: Payer: Medicare Other

## 2022-11-28 ENCOUNTER — Other Ambulatory Visit: Payer: Medicare Other

## 2022-11-28 LAB — T4: T4, Total: 7.7 ug/dL (ref 4.5–12.0)

## 2022-12-06 ENCOUNTER — Other Ambulatory Visit: Payer: Self-pay

## 2022-12-12 ENCOUNTER — Other Ambulatory Visit: Payer: Self-pay | Admitting: Interventional Radiology

## 2022-12-12 DIAGNOSIS — N2889 Other specified disorders of kidney and ureter: Secondary | ICD-10-CM

## 2022-12-17 ENCOUNTER — Ambulatory Visit
Admission: RE | Admit: 2022-12-17 | Discharge: 2022-12-17 | Disposition: A | Discharge status: Home / Self Care | Payer: Medicare Other | Source: Ambulatory Visit | Attending: Interventional Radiology | Admitting: Interventional Radiology

## 2022-12-17 DIAGNOSIS — N2889 Other specified disorders of kidney and ureter: Secondary | ICD-10-CM

## 2022-12-17 NOTE — Progress Notes (Signed)
Patient ID: Marc [name], male   DOB: [birth year], 67 y.o.   MRN: [id number]       Chief Complaint: Patient was consulted remotely today (TeleHealth) for f/u renal ablation  at the request of Shick,Michael.    Referring Physician(s): Si Gaul  History of Present Illness: Marc [name] is a 67 y.o. male h/o metastatic renal cell carcinoma s/p LEFT  nephrectomy, and cryoablation of 2 lesions in the RIGHT kidney by Dr. Miles Costain on 08/23/21.  He is followed by Dr. Arbutus Ped. I spoke with him today in Dr. Chester Holstein absence. He is doing well. No new symptoms. CT on 11/02/22 without IV contrast shows stable ablation zone in RUP kidney. Diminished size of a pleural soft tissue nodule about the medial right costophrenic recess consistent with treatment response. Unchanged, somewhat nodular scarring and volume loss of the right midlung and right lung base. No new lesions.  Past Medical History:  Diagnosis Date   Arthritis    Cancer (HCC)    Kidney, Lung, Pituitary   Hyperlipidemia    Hypothyroidism     Past Surgical History:  Procedure Laterality Date   CRANIOTOMY N/A 11/08/2021   Procedure: ENDOSCOPIC ENDONASAL RESECTION OF SELLAR MASS;  Surgeon: Jadene Pierini, MD;  Location: [facility] OR;  Service: Neurosurgery;  Laterality: N/A;  RM 20   IR RADIOLOGIST EVAL & MGMT  07/14/2021   IR RADIOLOGIST EVAL & MGMT  07/27/2021   IR RADIOLOGIST EVAL & MGMT  01/30/2022   RADIOFREQUENCY ABLATION N/A 08/23/2021   Procedure: RENAL CRYO ABLATION;  Surgeon: Berdine Dance, MD;  Location: [facility];  Service: Anesthesiology;  Laterality: N/A;   right shoulder rotator cuff repair Right    ROBOT ASSISTED LAPAROSCOPIC NEPHRECTOMY Left 01/05/2020   Procedure: XI ROBOTIC ASSISTED LAPAROSCOPIC RADICAL NEPHRECTOMY;  Surgeon: Rene Paci, MD;  Location: [facility];  Service: Urology;  Laterality: Left;   TOTAL HIP ARTHROPLASTY Left 03/31/2020   Procedure: LEFT TOTAL HIP ARTHROPLASTY ANTERIOR APPROACH;  Surgeon:  Kathryne Hitch, MD;  Location: [facility] OR;  Service: Orthopedics;  Laterality: Left;   TRANSPHENOIDAL APPROACH EXPOSURE N/A 11/08/2021   Procedure: TRANSPHENOIDAL APPROACH EXPOSURE;  Surgeon: Osborn Coho, MD;  Location: [facility] OR;  Service: ENT;  Laterality: N/A;    Allergies: Patient has no known allergies.  Medications: Prior to Admission medications   Medication Sig Start Date End Date Taking? Authorizing Provider  Ascorbic Acid (VITAMIN C PO) Take 1 tablet by mouth every evening.    [provider]  aspirin EC 81 MG tablet Take 81 mg by mouth every evening. Swallow whole.    [provider]  cholecalciferol (VITAMIN D3) 25 MCG (1000 UNIT) tablet Take 1,000 Units by mouth every evening.    [provider]  Ferrous Sulfate (IRON SLOW RELEASE PO) Take 1 tablet by mouth every evening.    [provider]  ibuprofen (ADVIL) 200 MG tablet Take 400-600 mg by mouth every 6 (six) hours as needed for moderate pain.    [provider]  INLYTA 5 MG tablet TAKE 1 TABLET BY MOUTH DAILY 09/22/22   Si Gaul, MD  levothyroxine (SYNTHROID) 88 MCG tablet Take 1 tablet (88 mcg total) by mouth daily. 06/22/22   Shamleffer, Konrad Dolores, MD  Multiple Vitamin (MULTI VITAMIN) TABS 1 tablet Orally Once a day for 30 day(s)    [provider]  Multiple Vitamins-Minerals (MULTIVITAMIN WITH MINERALS) tablet Take 1 tablet by mouth every evening.    [provider]  Omega-3  Fatty Acids (FISH OIL PO) Take 1 capsule by mouth every evening.    [provider]  predniSONE (DELTASONE) 5 MG tablet Take 1.5 tablets (7.5 mg total) by mouth daily with breakfast. Pt may double up during sick days 06/22/22   Shamleffer, Konrad Dolores, MD  prochlorperazine (COMPAZINE) 10 MG tablet Take 1 tablet (10 mg total) by mouth every 6 (six) hours as needed for nausea or vomiting. 08/31/21   Benjiman Core, MD  rosuvastatin (CRESTOR) 20 MG tablet Take 1 tablet  (20 mg total) by mouth daily. 05/16/22   Nahser, Deloris Ping, MD  Testosterone (ANDROGEL) 20.25 MG/1.25GM (1.62%) GEL Place 2 Pump onto the skin as directed. 06/26/22   Shamleffer, Konrad Dolores, MD  vitamin E 180 MG (400 UNITS) capsule Take 400 Units by mouth every evening.    [provider]     No family history on file.  Social History   Socioeconomic History   Marital status: Single    Spouse name: Not on file   Number of children: 2   Years of education: Not on file   Highest education level: Not on file  Occupational History   Not on file  Tobacco Use   Smoking status: Former    Current packs/day: 0.00    Types: Cigarettes    Start date: 04/02/1985    Quit date: 04/03/2015    Years since quitting: 7.7   Smokeless tobacco: Never  Vaping Use   Vaping status: Former   Quit date: 04/16/2021   Substances: Nicotine, Flavoring  Substance and Sexual Activity   Alcohol use: Yes    Comment: occas    Drug use: No   Sexual activity: Not on file  Other Topics Concern   Not on file  Social History Narrative   Not on file   Social Determinants of Health   Financial Resource Strain: Not on file  Food Insecurity: No Food Insecurity (11/01/2022)   Hunger Vital Sign    Worried About Running Out of Food in the Last Year: Never true    Ran Out of Food in the Last Year: Never true  Transportation Needs: No Transportation Needs (11/01/2022)   PRAPARE - Administrator, Civil Service (Medical): No    Lack of Transportation (Non-Medical): No  Physical Activity: Not on file  Stress: Not on file  Social Connections: Not on file    ECOG Status: 1 - Symptomatic but completely ambulatory  Review of Systems  Review of Systems: A 12 point ROS discussed and pertinent positives are indicated in the HPI above.  All other systems are negative.   Physical Exam No direct physical exam was performed (except for noted visual exam findings with Video Visits).    Vital  Signs: There were no vitals taken for this visit.  Imaging:  CT CHEST, ABDOMEN AND PELVIS WITHOUT CONTRAST  TECHNIQUE: Multidetector CT imaging of the chest, abdomen and pelvis was performed following the standard protocol without IV contrast.  RADIATION DOSE REDUCTION: This exam was performed according to the departmental dose-optimization program which includes automated exposure control, adjustment of the mA and/or kV according to patient size and/or use of iterative reconstruction technique.  COMPARISON: 07/19/2022  FINDINGS: CT CHEST FINDINGS  Cardiovascular: Aortic atherosclerosis. Normal heart size. Three-vessel coronary artery calcifications. No pericardial effusion.  Mediastinum/Nodes: No enlarged mediastinal, hilar, or axillary lymph nodes. Thyroid gland, trachea, and esophagus demonstrate no significant findings.  Lungs/Pleura: Diminished size of a pleural soft tissue nodule about  the medial right costophrenic recess measuring 2.8 x 1.5 cm, previously 3.4 x 2.3 cm (series 2, image 45). Unchanged, somewhat nodular scarring and volume loss of the right midlung (series 4, image 69) and right lower lobe abutting the diaphragm (series 4, image 89). No pleural effusion or pneumothorax.  Musculoskeletal: No chest wall abnormality. No acute osseous findings.  CT ABDOMEN PELVIS FINDINGS  Hepatobiliary: No solid liver abnormality is seen. Small gallstones. No gallbladder wall thickening, or biliary dilatation.  Pancreas: Unremarkable. No pancreatic ductal dilatation or surrounding inflammatory changes.  Spleen: Normal in size without significant abnormality.  Adrenals/Urinary Tract: Adrenal glands are unremarkable. Status post left nephrectomy. No suspicious soft tissue or other noncontrast evidence of locally recurrent malignancy. Unchanged noncontrast appearance of an ablation site of the posterior medial superior pole of the right kidney (series 2, image 59).  Unchanged adjacent fluid attenuation lesion lesion of the medial midportion of the right kidney, most likely a renal cortical cyst although incompletely characterized (series 2, image 62). Bladder is unremarkable.  Stomach/Bowel: Stomach is within normal limits. Appendix appears normal. No evidence of bowel wall thickening, distention, or inflammatory changes. Descending and sigmoid diverticulosis.  Vascular/Lymphatic: Aortic atherosclerosis. No enlarged abdominal or pelvic lymph nodes.  Reproductive: Evaluation of the prostate significantly limited by dense metallic streak artifact from adjacent left hip arthroplasty. Within this limitation, no obvious abnormality.  Other: No abdominal wall hernia or abnormality. No ascites.  Musculoskeletal: No acute osseous findings. Status post left hip total arthroplasty.  IMPRESSION: 1. Diminished size of a pleural soft tissue nodule about the medial right costophrenic recess consistent with treatment response. 2. Unchanged, somewhat nodular scarring and volume loss of the right midlung and right lung base. 3. Status post left nephrectomy. No suspicious soft tissue or other noncontrast evidence of locally recurrent malignancy. 4. Unchanged appearance of an ablation site of the posterior medial superior pole of the right kidney without noncontrast evidence of local recurrence. 5. No noncontrast evidence of lymphadenopathy or metastatic disease in the abdomen or pelvis. 6. Cholelithiasis. 7. Coronary artery disease.  Aortic Atherosclerosis (ICD10-I70.0).   Electronically Signed By: Jearld Lesch M.D. On: 11/05/2022 12:02   Labs:  CBC: Recent Labs    09/25/22 1024 10/16/22 0954 11/06/22 1053 11/27/22 0826  WBC 12.2* 8.4 9.1 7.5  HGB 14.6 14.5 14.6 14.6  HCT 43.0 44.1 42.7 43.4  PLT 195 197 215 225    COAGS: No results for input(s): "INR", "APTT" in the last 8760 hours.  BMP: Recent Labs    09/25/22 1024  10/16/22 0954 11/06/22 1053 11/27/22 0826  NA 140 140 139 142  K 3.8 3.7 3.7 3.9  CL 108 108 107 105  CO2 26 25 25 30   GLUCOSE 88 90 94 84  BUN 19 19 20 15   CALCIUM 9.2 8.7* 8.7* 9.1  CREATININE 1.43* 1.22 1.26* 1.18  GFRNONAA 54* >60 >60 >60    LIVER FUNCTION TESTS: Recent Labs    09/25/22 1024 10/16/22 0954 11/06/22 1053 11/27/22 0826  BILITOT 0.4 0.4 0.4 0.6  AST 35 44* 33 33  ALT 45* 53* 40 46*  ALKPHOS 58 53 57 57  PROT 6.9 6.7 6.8 6.8  ALBUMIN 4.1 4.2 4.2 4.2    TUMOR MARKERS: No results for input(s): "AFPTM", "CEA", "CA199", "CHROMGRNA" in the last 8760 hours.  Assessment and Plan:  My impression is that the Right renal ablation zone shows no evident   residual or recurrent disease. Noncontrast CT is less sensitive than MR  renal with contrast for evaluation.  His disease elsewhere remains well controlled. We reviewed the pros and cons of CT and MR f/u regiments. He will f/u with Dr Arbutus Ped. He is getting routine surveillance every 4-66mo at his direction.  Plan to f/u with [facility] after his next surveillance imaging.   Thank you for this interesting consult.  I greatly enjoyed meeting Marc [name] and look forward to participating in their care.  A copy of this report was sent to the requesting provider on this date.  Electronically Signed: Durwin Glaze 12/17/2022, 11:25 AM   I spent a total of    25 Minutes in remote  clinical consultation, greater than 50% of which was counseling/coordinating care for right renal cell carcinoma post ablation.    Visit type: Audio only (telephone). Audio (no video) only due to patient's lack of internet/smartphone capability. Alternative for in-person consultation at [facility], [address] [place], [place], [place].  This format is felt to be most appropriate for this patient at this time.  All issues noted in this document were discussed and addressed.

## 2022-12-18 ENCOUNTER — Inpatient Hospital Stay (HOSPITAL_BASED_OUTPATIENT_CLINIC_OR_DEPARTMENT_OTHER): Payer: Medicare Other | Admitting: Internal Medicine

## 2022-12-18 ENCOUNTER — Inpatient Hospital Stay: Payer: Medicare Other | Attending: Internal Medicine

## 2022-12-18 ENCOUNTER — Inpatient Hospital Stay: Payer: Medicare Other

## 2022-12-18 VITALS — BP 112/79 | HR 64 | Temp 98.1°F | Resp 18

## 2022-12-18 DIAGNOSIS — C642 Malignant neoplasm of left kidney, except renal pelvis: Secondary | ICD-10-CM | POA: Insufficient documentation

## 2022-12-18 DIAGNOSIS — C7989 Secondary malignant neoplasm of other specified sites: Secondary | ICD-10-CM | POA: Insufficient documentation

## 2022-12-18 DIAGNOSIS — Z5112 Encounter for antineoplastic immunotherapy: Secondary | ICD-10-CM | POA: Diagnosis present

## 2022-12-18 DIAGNOSIS — C7800 Secondary malignant neoplasm of unspecified lung: Secondary | ICD-10-CM | POA: Insufficient documentation

## 2022-12-18 DIAGNOSIS — Z7982 Long term (current) use of aspirin: Secondary | ICD-10-CM | POA: Diagnosis not present

## 2022-12-18 DIAGNOSIS — Z79899 Other long term (current) drug therapy: Secondary | ICD-10-CM | POA: Insufficient documentation

## 2022-12-18 DIAGNOSIS — Z923 Personal history of irradiation: Secondary | ICD-10-CM | POA: Diagnosis not present

## 2022-12-18 LAB — CMP (CANCER CENTER ONLY)
ALT: 48 U/L — ABNORMAL HIGH (ref 0–44)
AST: 34 U/L (ref 15–41)
Albumin: 4.2 g/dL (ref 3.5–5.0)
Alkaline Phosphatase: 63 U/L (ref 38–126)
Anion gap: 6 (ref 5–15)
BUN: 16 mg/dL (ref 8–23)
CO2: 29 mmol/L (ref 22–32)
Calcium: 9 mg/dL (ref 8.9–10.3)
Chloride: 106 mmol/L (ref 98–111)
Creatinine: 1.16 mg/dL (ref 0.61–1.24)
GFR, Estimated: >60 mL/min (ref >60)
Glucose, Bld: 87 mg/dL (ref 70–99)
Potassium: 4.1 mmol/L (ref 3.5–5.1)
Sodium: 141 mmol/L (ref 135–145)
Total Bilirubin: 0.6 mg/dL (ref 0.3–1.2)
Total Protein: 6.9 g/dL (ref 6.5–8.1)

## 2022-12-18 LAB — CBC WITH DIFFERENTIAL (CANCER CENTER ONLY)
Abs Immature Granulocytes: 0.02 10*3/uL (ref 0.00–0.07)
Basophils Absolute: 0.1 10*3/uL (ref 0.0–0.1)
Basophils Relative: 1 %
Eosinophils Absolute: 0.3 10*3/uL (ref 0.0–0.5)
Eosinophils Relative: 4 %
HCT: 45.6 % (ref 39.0–52.0)
Hemoglobin: 14.8 g/dL (ref 13.0–17.0)
Immature Granulocytes: 0 %
Lymphocytes Relative: 30 %
Lymphs Abs: 2.3 10*3/uL (ref 0.7–4.0)
MCH: 29.9 pg (ref 26.0–34.0)
MCHC: 32.5 g/dL (ref 30.0–36.0)
MCV: 92.1 fL (ref 80.0–100.0)
Monocytes Absolute: 0.6 10*3/uL (ref 0.1–1.0)
Monocytes Relative: 7 %
Neutro Abs: 4.3 10*3/uL (ref 1.7–7.7)
Neutrophils Relative %: 58 %
Platelet Count: 208 10*3/uL (ref 150–400)
RBC: 4.95 MIL/uL (ref 4.22–5.81)
RDW: 15 % (ref 11.5–15.5)
WBC Count: 7.5 10*3/uL (ref 4.0–10.5)
nRBC: 0 % (ref 0.0–0.2)

## 2022-12-18 LAB — TSH: TSH: 0.01 u[IU]/mL — ABNORMAL LOW (ref 0.350–4.500)

## 2022-12-18 MED ORDER — SODIUM CHLORIDE 0.9 % IV SOLN
200.0000 mg | Freq: Once | INTRAVENOUS | Status: AC
  Administered 2022-12-18: 200 mg via INTRAVENOUS
  Filled 2022-12-18: qty 8

## 2022-12-18 MED ORDER — SODIUM CHLORIDE 0.9 % IV SOLN: Freq: Once | INTRAVENOUS | Status: AC

## 2022-12-18 NOTE — Patient Instructions (Signed)
[facility]  Discharge Instructions: Thank you for choosing [facility] to provide your oncology and hematology care.   If you have a lab appointment with the Cancer Center, please go directly to the Cancer Center and check in at the registration area.   Wear comfortable clothing and clothing appropriate for easy access to any Portacath or PICC line.   We strive to give you quality time with your provider. You may need to reschedule your appointment if you arrive late (15 or more minutes).  Arriving late affects you and other patients whose appointments are after yours.  Also, if you miss three or more appointments without notifying the office, you may be dismissed from the clinic at the provider's discretion.      For prescription refill requests, have your pharmacy contact our office and allow 72 hours for refills to be completed.    Today you received the following chemotherapy and/or immunotherapy agents: Keytruda      To help prevent nausea and vomiting after your treatment, we encourage you to take your nausea medication as directed.  BELOW ARE SYMPTOMS THAT SHOULD BE REPORTED IMMEDIATELY: *FEVER GREATER THAN 100.4 F (38 C) OR HIGHER *CHILLS OR SWEATING *NAUSEA AND VOMITING THAT IS NOT CONTROLLED WITH YOUR NAUSEA MEDICATION *UNUSUAL SHORTNESS OF BREATH *UNUSUAL BRUISING OR BLEEDING *URINARY PROBLEMS (pain or burning when urinating, or frequent urination) *BOWEL PROBLEMS (unusual diarrhea, constipation, pain near the anus) TENDERNESS IN MOUTH AND THROAT WITH OR WITHOUT PRESENCE OF ULCERS (sore throat, sores in mouth, or a toothache) UNUSUAL RASH, SWELLING OR PAIN  UNUSUAL VAGINAL DISCHARGE OR ITCHING   Items with * indicate a potential emergency and should be followed up as soon as possible or go to the Emergency Department if any problems should occur.  Please show the CHEMOTHERAPY ALERT CARD or IMMUNOTHERAPY ALERT CARD at  check-in to the Emergency Department and triage nurse.  Should you have questions after your visit or need to cancel or reschedule your appointment, please contact [facility]  Dept: [phone]  and follow the prompts.  Office hours are 8:00 a.m. to 4:30 p.m. Monday - Friday. Please note that voicemails left after 4:00 p.m. may not be returned until the following business day.  We are closed weekends and major holidays. You have access to a nurse at all times for urgent questions. Please call the main number to the clinic Dept: [phone] and follow the prompts.   For any non-urgent questions, you may also contact your provider using MyChart. We now offer e-Visits for anyone 13 and older to request care online for non-urgent symptoms. For details visit [email or web address].   Also download the MyChart app! Go to the app store, search "MyChart", open the app, select [place], and log in with your MyChart username and password.

## 2022-12-18 NOTE — Progress Notes (Signed)
[facility] [phone]   Fax:([phone]  OFFICE PROGRESS NOTE  [name], [name], [place] 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    PRIOR THERAPY: 1) status post robotic assisted laparoscopic left radical nephrectomy by Dr. Liliane Shi on January 05, 2020.  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  The final pathological staging was T3aN0 grade 2 tumor.     2) Status post radiation therapy to the pulmonary nodules completed March 2023.  He received 50 Gray in 5 fractions.   3) Status post cryoablation to 2 lesions in the right kidney completed on Aug 23, 2021.   4) status post endoscopic transsphenoidal pituitary resection completed by Dr. Annalee Genta on November 08, 2021.  The final pathology showed clear-cell renal cell carcinoma.   5) SRS treatment to pituitary bed completed in October 2023.  He received 5 fractions for a total of 25 Gray.  CURRENT THERAPY: Keytruda 200 Mg IV every 3 weeks started Aug 04, 2021 status post 18 cycles with axitinib 5 mg p.o. daily started February 09, 2022.  INTERVAL HISTORY: Marc [name] 67 y.o. male Discussed the use of AI scribe software for clinical note transcription with the patient, who gave verbal consent to proceed.  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 He denies experiencing chest pain, breathing issues, cough, or hematemesis. However, he continues to experience diarrhea, with a frequency of two to three times daily without medication. To manage this, he is taking three tablets of  Imodium daily, which has helped control the urgency and unpredictability of the bowel movements.  He denies any recent weight loss, instead reporting a gain of approximately one and a half pounds since the last visit. He also denies experiencing fatigue, headaches, changes in vision, rashes, or leg swelling.  The patient also mentions a recent CT scan performed on the second of August. He reports a follow-up call from the office of the IR doctor who performed a cryoablation, suggesting that an MRI might provide more detailed imaging in the future.      MEDICAL HISTORY: Past Medical History:  Diagnosis Date   Arthritis    Cancer (HCC)    Kidney, Lung, Pituitary   Hyperlipidemia    Hypothyroidism     ALLERGIES:  has No Known Allergies.  MEDICATIONS:  Current Outpatient Medications  Medication Sig Dispense Refill   Ascorbic Acid (VITAMIN C PO) Take 1 tablet by mouth every evening.     aspirin EC 81 MG tablet Take 81 mg by mouth every evening. Swallow whole.     cholecalciferol (VITAMIN D3) 25 MCG (1000 UNIT) tablet Take 1,000 Units by mouth every evening.     Ferrous Sulfate (IRON SLOW RELEASE PO) Take 1 tablet by mouth every evening.     ibuprofen (ADVIL) 200 MG tablet Take 400-600 mg by mouth every 6 (six) hours as needed for moderate pain.     INLYTA 5 MG tablet TAKE 1 TABLET BY MOUTH DAILY 30 tablet 3   levothyroxine (SYNTHROID) 88 MCG tablet Take 1 tablet (88 mcg total) by  mouth daily. 90 tablet 3   Multiple Vitamin (MULTI VITAMIN) TABS 1 tablet Orally Once a day for 30 day(s)     Multiple Vitamins-Minerals (MULTIVITAMIN WITH MINERALS) tablet Take 1 tablet by mouth every evening.     Omega-3 Fatty Acids (FISH OIL PO) Take 1 capsule by mouth every evening.     predniSONE (DELTASONE) 5 MG tablet Take 1.5 tablets (7.5 mg total) by mouth daily with breakfast. Pt may double up during sick days 150 tablet 3   prochlorperazine (COMPAZINE) 10 MG tablet Take 1 tablet (10 mg total) by mouth  every 6 (six) hours as needed for nausea or vomiting. 30 tablet 0   rosuvastatin (CRESTOR) 20 MG tablet Take 1 tablet (20 mg total) by mouth daily. 90 tablet 3   Testosterone (ANDROGEL) 20.25 MG/1.25GM (1.62%) GEL Place 2 Pump onto the skin as directed. 150 g 5   vitamin E 180 MG (400 UNITS) capsule Take 400 Units by mouth every evening.     No current facility-administered medications for this visit.    SURGICAL HISTORY:  Past Surgical History:  Procedure Laterality Date   CRANIOTOMY N/A 11/08/2021   Procedure: ENDOSCOPIC ENDONASAL RESECTION OF SELLAR MASS;  Surgeon: Jadene Pierini, MD;  Location: [facility] OR;  Service: Neurosurgery;  Laterality: N/A;  RM 20   IR RADIOLOGIST EVAL & MGMT  07/14/2021   IR RADIOLOGIST EVAL & MGMT  07/27/2021   IR RADIOLOGIST EVAL & MGMT  01/30/2022   RADIOFREQUENCY ABLATION N/A 08/23/2021   Procedure: RENAL CRYO ABLATION;  Surgeon: Berdine Dance, MD;  Location: [facility];  Service: Anesthesiology;  Laterality: N/A;   right shoulder rotator cuff repair Right    ROBOT ASSISTED LAPAROSCOPIC NEPHRECTOMY Left 01/05/2020   Procedure: XI ROBOTIC ASSISTED LAPAROSCOPIC RADICAL NEPHRECTOMY;  Surgeon: Rene Paci, MD;  Location: [facility];  Service: Urology;  Laterality: Left;   TOTAL HIP ARTHROPLASTY Left 03/31/2020   Procedure: LEFT TOTAL HIP ARTHROPLASTY ANTERIOR APPROACH;  Surgeon: Kathryne Hitch, MD;  Location: [facility] OR;  Service: Orthopedics;  Laterality: Left;   TRANSPHENOIDAL APPROACH EXPOSURE N/A 11/08/2021   Procedure: TRANSPHENOIDAL APPROACH EXPOSURE;  Surgeon: Osborn Coho, MD;  Location: [facility] OR;  Service: ENT;  Laterality: N/A;    REVIEW OF SYSTEMS:  A comprehensive review of systems was negative except for: Gastrointestinal: positive for diarrhea   PHYSICAL EXAMINATION: General appearance: alert, cooperative, and no distress Head: Normocephalic, without obvious abnormality, atraumatic Neck: no adenopathy, no JVD, supple, symmetrical, trachea  midline, and thyroid not enlarged, symmetric, no tenderness/mass/nodules Lymph nodes: Cervical, supraclavicular, and axillary nodes normal. Resp: clear to auscultation bilaterally Back: symmetric, no curvature. ROM normal. No CVA tenderness. Cardio: regular rate and rhythm, S1, S2 normal, no murmur, click, rub or gallop GI: soft, non-tender; bowel sounds normal; no masses,  no organomegaly Extremities: extremities normal, atraumatic, no cyanosis or edema  ECOG PERFORMANCE STATUS: 1 - Symptomatic but completely ambulatory  Blood pressure 108/88, pulse 68, temperature (!) 97.5 F (36.4 C), temperature source Oral, resp. rate 16, height 5\' 9"  (1.753 m), weight 219 lb 11.2 oz (99.7 kg), SpO2 100%.  LABORATORY DATA: Lab Results  Component Value Date   WBC 7.5 12/18/2022   HGB 14.8 12/18/2022   HCT 45.6 12/18/2022   MCV 92.1 12/18/2022   PLT 208 12/18/2022      Chemistry      Component Value Date/Time   NA 141 12/18/2022 1047   NA 140 08/09/2022 1014   K 4.1 12/18/2022 1047  CL 106 12/18/2022 1047   CO2 29 12/18/2022 1047   BUN 16 12/18/2022 1047   BUN 18 08/09/2022 1014   CREATININE 1.16 12/18/2022 1047      Component Value Date/Time   CALCIUM 9.0 12/18/2022 1047   ALKPHOS 63 12/18/2022 1047   AST 34 12/18/2022 1047   ALT 48 (H) 12/18/2022 1047   BILITOT 0.6 12/18/2022 1047       RADIOGRAPHIC STUDIES: No results found.  ASSESSMENT AND PLAN: This is a very pleasant 67 years old white male diagnosed with stage IV clear-cell renal cell carcinoma with pulmonary involvement as well as pituitary metastasis in 2023.  The patient was initially diagnosed as stage III and October 2021 status post robotic assisted laparoscopic left radical nephrectomy followed by radiation therapy to the pulmonary nodules in March 2023 followed by cryoablation to 2 lesions in the right kidney completed in May 2023.  He is also status post endoscopic transsphenoidal pituitary resection on November 08, 2021.  He is also status post SRS to the pituitary bed in October 2023. The patient is currently undergoing treatment with Keytruda 200 Mg IV every 3 weeks status post 18 cycles.  This was started on Aug 04, 2021.  He is also on axitinib 5 mg p.o. daily since November 2023.  The patient has been tolerating his treatment well with no concerning adverse effects. I recommended for him to proceed with cycle #19 of his treatment today as planned.   Diarrhea Controlled with Imodium 3 tablets daily. Without Imodium, patient experiences 2-3 episodes of diarrhea daily. With Imodium, bowel movements are predictable and not urgent. -Continue Imodium 3 tablets daily.  Oncology Follow-up No new symptoms or side effects reported. Recent weight gain of 1.5 pounds. No fatigue, headaches, vision changes, rash, or leg swelling. Last scan was on November 02, 2022. -Continue current treatment regimen. -Next treatment in 3 weeks. -Consider MRI in the future as per recommendation from Dr. Mariel Craft office. -Return for follow-up in 3 weeks before the next round of treatment.     The patient voices understanding of current disease status and treatment options and is in agreement with the current care plan.  All questions were answered. The patient knows to call the clinic with any problems, questions or concerns. We can certainly see the patient much sooner if necessary.  The total time spent in the appointment was 20 minutes.  Disclaimer: This note was dictated with voice recognition software. Similar sounding words can inadvertently be transcribed and may not be corrected upon review.

## 2022-12-20 ENCOUNTER — Telehealth: Payer: Self-pay | Admitting: Radiation Therapy

## 2022-12-20 NOTE — Telephone Encounter (Signed)
I spoke with [name] about his upcoming brain MRI and follow-up visit with Dr. Barbaraann Cao. He has this information from MyChart, but was thankful for the call and clarification.   Marc [name] R.T.(R)(T) Radiation Special Procedures Navigator

## 2022-12-26 ENCOUNTER — Ambulatory Visit (INDEPENDENT_AMBULATORY_CARE_PROVIDER_SITE_OTHER): Payer: Medicare Other | Admitting: Internal Medicine

## 2022-12-26 ENCOUNTER — Encounter: Payer: Self-pay | Admitting: Internal Medicine

## 2022-12-26 VITALS — BP 122/70 | HR 68 | Ht 69.0 in | Wt 219.4 lb

## 2022-12-26 DIAGNOSIS — E038 Other specified hypothyroidism: Secondary | ICD-10-CM

## 2022-12-26 DIAGNOSIS — E893 Postprocedural hypopituitarism: Secondary | ICD-10-CM | POA: Diagnosis not present

## 2022-12-26 DIAGNOSIS — E2749 Other adrenocortical insufficiency: Secondary | ICD-10-CM

## 2022-12-26 DIAGNOSIS — R7989 Other specified abnormal findings of blood chemistry: Secondary | ICD-10-CM

## 2022-12-26 NOTE — Progress Notes (Addendum)
Name: Marc [name]  MRN/ DOB: [id number], 04-04-1955    Age/ Sex: 67 y.o., male    PCP: Milus Height, PA   Reason for Endocrinology Evaluation: Low testosterone      Date of Initial Endocrinology Evaluation: 06/19/2021    HPI: Marc [name] is a 67 y.o. 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 The patient presented for initial endocrinology clinic visit on 06/19/2021 for consultative assistance with his Low testosterone .   [name] was referred her for further evaluation of low testosterone and LH this was during work up for low libido. He denies erectile dysfunction but has noted decrease in spontaneous     Of note, he is S/P left nephrectomy due to RCC with Mets to the lungs  As/P cryoablation of 2 lesions in the right kidney completed 07/2021 Completed radiation therapy to pulmonary nodules 05/2021    He has never been on testosterone prior to his presentation to our clinic Had COVID in 01/2021 followed by long COVID, he felt terrible until he went on prednisone  which has helped   1/5th he saw ortho for leg weakness, PT worsened his condition . Steroid improved  his condition after taking them for  5 days , after that he noted decrease appetite, lost 40 lbs and feeling poorly again. Saw PCP 2/20th started on Prednisone 40 mg for 5 days.  He received another refill 3/3rd and has been taking 10 mg daily    He has no prostate cancer   No CAD  No FH of prostate cancer  He is an ex-smoke    He was started on HC with undetectable ACTH < 5 pg/mL and low serum cortisol 0.2ug/dL 0/27/2536  He was started on LT-4 replacement with a FT4 at 0.36 ng/dL , TSH inappropriately normal 06/20/2021   His testosterone was low at 5 ng/dL( [id number]) He was started on Natesto nasal spray, this was cleared by his oncologist as the benefit outweighed the risk     He is s/p radiation therapy to the pulmonary nodules  05/2021 He is s/p cryoablation to 2 lesions in the right kidney 07/2021  He is s/p uncomplicated endoscopic endonasal resection of the sellar tumor by Dr. Maurice Small on 11/08/2021, pathology report consistent with clear cell carcinoma  as well as SRS treatment to the pituitary bed 12/2021, he received 5 fractions of a total of 25 Gray  SUBJECTIVE:   Today (12/26/22): Marc [name] is here for follow-up on hypopituitarism.   He was started on Keytruda on 12/05/2021, and continues to receive infusions He is also on axitinib oral daily  Recent imaging of the chest/abdomen showed a decrease  in size of pleural-based mass.  Weight continues to trend down Denies headaches  Vision is improving  Denies constipation but has diarrhea ,takes Imodium  Denies dizziness or nausea nor vomiting    HOME ENDOCRINE MEDICATIONS: Prednisone 5 mg, , 1.5 tabs every morning Levothyroxine 88 mcg daily AndroGel 2 pump to each shoulder      HISTORY:  Past Medical History:  Past Medical History:  Diagnosis Date   Arthritis    Cancer (HCC)    Kidney, Lung, Pituitary   Hyperlipidemia    Hypothyroidism    Past Surgical History:  Past Surgical History:  Procedure Laterality Date   CRANIOTOMY N/A 11/08/2021   Procedure: ENDOSCOPIC ENDONASAL RESECTION OF SELLAR MASS;  Surgeon: Jadene Pierini, MD;  Location: [facility];  Service: Neurosurgery;  Laterality: N/A;  RM 20   IR RADIOLOGIST EVAL & MGMT  07/14/2021   IR RADIOLOGIST EVAL & MGMT  07/27/2021   IR RADIOLOGIST EVAL & MGMT  01/30/2022   RADIOFREQUENCY ABLATION N/A 08/23/2021   Procedure: RENAL CRYO ABLATION;  Surgeon: Berdine Dance, MD;  Location: [facility];  Service: Anesthesiology;  Laterality: N/A;   right shoulder rotator cuff repair Right    ROBOT ASSISTED LAPAROSCOPIC NEPHRECTOMY Left 01/05/2020   Procedure: XI ROBOTIC ASSISTED LAPAROSCOPIC RADICAL NEPHRECTOMY;  Surgeon: Rene Paci, MD;  Location: [facility];  Service: Urology;  Laterality:  Left;   TOTAL HIP ARTHROPLASTY Left 03/31/2020   Procedure: LEFT TOTAL HIP ARTHROPLASTY ANTERIOR APPROACH;  Surgeon: Kathryne Hitch, MD;  Location: [facility];  Service: Orthopedics;  Laterality: Left;   TRANSPHENOIDAL APPROACH EXPOSURE N/A 11/08/2021   Procedure: TRANSPHENOIDAL APPROACH EXPOSURE;  Surgeon: Osborn Coho, MD;  Location[facility];  Service: ENT;  Laterality: N/A;    Social History:  reports that he quit smoking about 7 years ago. His smoking use included cigarettes. He started smoking about 37 years ago. He has never used smokeless tobacco. He reports current alcohol use. He reports that he does not use drugs. Family History: family history is not on file.   HOME MEDICATIONS: Allergies as of 12/26/2022   No Known Allergies      Medication List        Accurate as of December 26, 2022 11:21 AM. If you have any questions, ask your nurse or doctor.          aspirin EC 81 MG tablet Take 81 mg by mouth every evening. Swallow whole.   chlorhexidine 0.12 % solution Commonly known as: PERIDEX SMARTSIG:By Mouth   cholecalciferol 25 MCG (1000 UNIT) tablet Commonly known as: VITAMIN D3 Take 1,000 Units by mouth every evening.   FISH OIL PO Take 1 capsule by mouth every evening.   HYDROcodone-acetaminophen 10-325 MG tablet Commonly known as: NORCO Take by mouth.   ibuprofen 200 MG tablet Commonly known as: ADVIL Take 400-600 mg by mouth every 6 (six) hours as needed for moderate pain.   Inlyta 5 MG tablet Generic drug: axitinib TAKE 1 TABLET BY MOUTH DAILY   IRON SLOW RELEASE PO Take 1 tablet by mouth every evening.   levothyroxine 88 MCG tablet Commonly known as: SYNTHROID Take 1 tablet (88 mcg total) by mouth daily.   Multi Vitamin Tabs 1 tablet Orally Once a day for 30 day(s)   multivitamin with minerals tablet Take 1 tablet by mouth every evening.   ondansetron 8 MG disintegrating tablet Commonly known as: ZOFRAN-ODT Take 8 mg by mouth  daily.   predniSONE 5 MG tablet Commonly known as: DELTASONE Take 1.5 tablets (7.5 mg total) by mouth daily with breakfast. Pt may double up during sick days   prochlorperazine 10 MG tablet Commonly known as: COMPAZINE Take 1 tablet (10 mg total) by mouth every 6 (six) hours as needed for nausea or vomiting.   rosuvastatin 20 MG tablet Commonly known as: CRESTOR Take 1 tablet (20 mg total) by mouth daily.   Testosterone 20.25 MG/1.25GM (1.62%) Gel Commonly known as: AndroGel Place 2 Pump onto the skin as directed.   Testosterone 1.62 % Gel SMARTSIG:81 Milligram(s) Topical Every Morning   VITAMIN C PO Take 1 tablet by mouth every evening.   vitamin E 180 MG (400 UNITS) capsule Take 400 Units by mouth every evening.  REVIEW OF SYSTEMS: A comprehensive ROS was conducted with the patient and is negative except as per HPI    OBJECTIVE:  VS: BP 122/70 (BP Location: Left Arm, Patient Position: Sitting, Cuff Size: Large)   Pulse 68   Ht 5\' 9"  (1.753 m)   Wt 219 lb 6.4 oz (99.5 kg)   SpO2 98%   BMI 32.40 kg/m    Wt Readings from Last 3 Encounters:  12/26/22 219 lb 6.4 oz (99.5 kg)  12/18/22 219 lb 11.2 oz (99.7 kg)  11/27/22 218 lb (98.9 kg)   Body surface area is 2.2 meters squared.   EXAM: General: Pt appears well and is in NAD  Neck: General: Supple without adenopathy. Thyroid: Thyroid size normal.  No goiter or nodules appreciated.   Lungs: Clear with good BS bilat   Heart: Auscultation: RRR.  Abdomen:  soft, nontender  Extremities:  BL LE: Tace  pretibial edema  Mental Status: Judgment, insight: Intact Orientation: Oriented to time, place, and person Mood and affect: No depression, anxiety, or agitation     DATA REVIEWED:    Latest Reference Range & Units 12/26/22 11:38  Testosterone, Total, LC-MS-MS 250 - 1,100 ng/dL 478     Latest Reference Range & Units 12/18/22 10:47  Sodium 135 - 145 mmol/L 141  Potassium 3.5 - 5.1 mmol/L 4.1   Chloride 98 - 111 mmol/L 106  CO2 22 - 32 mmol/L 29  Glucose 70 - 99 mg/dL 87  BUN 8 - 23 mg/dL 16  Creatinine 2.95 - 6.21 mg/dL 3.08  Calcium 8.9 - 65.7 mg/dL 9.0  Anion gap 5 - 15  6  Alkaline Phosphatase 38 - 126 U/L 63  Albumin 3.5 - 5.0 g/dL 4.2  AST 15 - 41 U/L 34  ALT 0 - 44 U/L 48 (H)  Total Protein 6.5 - 8.1 g/dL 6.9  Total Bilirubin 0.3 - 1.2 mg/dL 0.6  GFR, Est Non African American >60 mL/min >60    Latest Reference Range & Units 12/18/22 10:47  TSH 0.350 - 4.500 uIU/mL 0.010 (L)  Thyroxine (T4) 4.5 - 12.0 ug/dL 6.8   MRI Brain 8/46/9629 FINDINGS: Brain: No substantial change in soft tissue in the right aspect of the sella and thickening of the infundibulum which is deviated to the right. No progressive enhancing mass. No evidence of acute infarct, acute hemorrhage, midline shift or hydrocephalus. No abnormal enhancement outside of the sella.   Vascular: Major arterial flow voids are maintained at the skull base.   Skull and upper cervical spine: Normal marrow signal.   Sinuses/Orbits: Mild paranasal sinus mucosal thickening. No acute orbital findings.   Other: No sizable mastoid effusions.   IMPRESSION: No substantial change in enhancing tissue in the right aspect of the sella.          ASSESSMENT/PLAN/RECOMMENDATIONS:   Hx of Pituitary macroadenoma, S/P transphenoidal hypophysectomy 10/2021   -This was found on brain MRI 05/2021 - S/P endoscopic endonasal pituitary resection 10/2021, pathology report consistent with renal cell carcinoma -S/p SRS of the pituitary bed - Started on Keytruda through Oncology 9/5, 2023 -Repeat MRI 10/2022 showed stable  size of the residual enhancing tissue along the right aspect of the sella  2.  Secondary adrenal insufficiency:  -We had switched to prednisone 06/2022 -Sick day rules discussed with the patient   Medication Prednisone 5 mg, 1.5 tablets daily   3. Secondary hypogonadism   -Testosterone is  normal , but  its at the upper limit of normal ,  will decrease as below  - The decision to start him on testosterone therapy  due to undetectable  testosterone and the benefit outweighed the risk   Change  androgel 2 pump to one shoulder and 1 pump to the other shoulder   4.  Secondary hypothyroidism:   -Patient is clinically euthyroid -He recently had a normal total T4 through his oncologist office -No changes   Medication  Continue levothyroxine 88 mcg daily    Follow-up in 6 months      Signed electronically by: Lyndle Herrlich, MD  [facility] Endocrinology  [facility] [address] [place] [place], [place] [address] Phone: [phone] FAX: [phone]   CC: Milus Height, PA [address] [place] [address] Phone: [phone] Fax: [phone]   Return to Endocrinology clinic as below: Future Appointments  Date Time Provider Department Center  12/26/2022 11:30 AM Marc [name], Konrad Dolores, MD [facility] None  01/08/2023 10:00 AM [facility] LAB [facility] None  01/08/2023 10:30 AM Heilingoetter, Cassandra L, PA-C [facility] None  01/08/2023 11:30 AM [facility] INFUSION [facility] None  01/29/2023  9:15 AM [facility] LAB [facility] None  01/29/2023  9:45 AM Si Gaul, MD [facility] None  01/29/2023 10:30 AM [facility] INFUSION [facility] None  01/30/2023 12:10 PM [facility]315 MR 3 GI-315MRI [facility] [address] [facility]  02/04/2023  7:00 AM [facility] CONFERENCE [facility] None  02/04/2023 11:00 AM Vaslow, Georgeanna Lea, MD [facility] None

## 2022-12-27 ENCOUNTER — Other Ambulatory Visit: Payer: Self-pay

## 2022-12-29 LAB — TESTOSTERONE, TOTAL, LC/MS/MS: Testosterone, Total, LC-MS-MS: 982 ng/dL (ref 250–1100)

## 2023-01-03 NOTE — Progress Notes (Signed)
[facility] OFFICE PROGRESS NOTE  Marc [name] 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    PRIOR THERAPY: 1) status post robotic assisted laparoscopic left radical nephrectomy by Dr. Liliane Shi on January 05, 2020.  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  The final pathological staging was T3aN0 grade 2 tumor.     2) Status post radiation therapy to the pulmonary nodules completed March 2023.  He received 50 Gray in 5 fractions.   3) Status post cryoablation to 2 lesions in the right kidney completed on Aug 23, 2021.   4) status post endoscopic transsphenoidal pituitary resection completed by Dr. Annalee Genta on November 08, 2021.  The final pathology showed clear-cell renal cell carcinoma.   5) SRS treatment to pituitary bed completed in October 2023.  He received 5 fractions for a total of 25 Gray.  CURRENT THERAPY: Keytruda 200 Mg IV every 3 weeks started Aug 04, 2021 status post 19 cycles with axitinib 5 mg p.o. daily started February 09, 2022.   INTERVAL HISTORY: Marc [name] 67 y.o. male returns to the clinic today for a follow-up visit.  He is feeling fairly well today without any concerning complaints. He mentions he ha a mild nasal congestion which he attributes to allergies as he is often outside.  He is currently on treatment with  Keytruda every 3 weeks and Axitinib p.o. daily.  The patient is tolerating treatment well without any concerning adverse side effects except for diarrhea but he states he has this under control with imodium which he takes daily. The patient denies any recent fever, chills, night sweats, unexplained weight loss. He is trying to lose weight with walking 4-5 miles per day and not eating  after 8 PM. He denies any appetite changes. He previously had mildly dry/rough skin which improved with udderly smooth cream. Denies any chest pain, shortness of breath, cough, or hemoptysis.  Denies any nausea, vomiting, or constipation. Denies any dysuria, malodorous urine, urinary frequency or urgency.  Denies any headache or visual changes.  Of note he is scheduled for repeat brain MRI on 01/30/2023. He also follows with Dr. Denny Levy from IR after he has surveillance imaging. They previously discussed pros/cons of MRI of the kidney. He is here today for evaluation and repeat blood work before undergoing cycle #20.    MEDICAL HISTORY: Past Medical History:  Diagnosis Date   Arthritis    Cancer (HCC)    Kidney, Lung, Pituitary   Hyperlipidemia    Hypothyroidism     ALLERGIES:  has No Known Allergies.  MEDICATIONS:  Current Outpatient Medications  Medication Sig Dispense Refill   Ascorbic Acid (VITAMIN C PO) Take 1 tablet by mouth every evening.     aspirin EC 81 MG tablet Take 81 mg by mouth every evening. Swallow whole.     chlorhexidine (PERIDEX) 0.12 % solution SMARTSIG:By Mouth     cholecalciferol (VITAMIN D3) 25 MCG (1000 UNIT) tablet Take 1,000 Units by mouth every evening.     Ferrous Sulfate (IRON SLOW RELEASE PO) Take 1 tablet by mouth every evening.     HYDROcodone-acetaminophen (NORCO) 10-325 MG tablet Take by mouth.     ibuprofen (ADVIL) 200 MG tablet Take 400-600 mg by mouth every 6 (six)  hours as needed for moderate pain.     INLYTA 5 MG tablet TAKE 1 TABLET BY MOUTH DAILY 30 tablet 3   levothyroxine (SYNTHROID) 88 MCG tablet Take 1 tablet (88 mcg total) by mouth daily. 90 tablet 3   Multiple Vitamin (MULTI VITAMIN) TABS 1 tablet Orally Once a day for 30 day(s)     Multiple Vitamins-Minerals (MULTIVITAMIN WITH MINERALS) tablet Take 1 tablet by mouth every evening.     Omega-3 Fatty Acids (FISH OIL PO) Take 1 capsule by mouth every evening.     ondansetron (ZOFRAN-ODT) 8  MG disintegrating tablet Take 8 mg by mouth daily.     predniSONE (DELTASONE) 5 MG tablet Take 1.5 tablets (7.5 mg total) by mouth daily with breakfast. Pt may double up during sick days 150 tablet 3   prochlorperazine (COMPAZINE) 10 MG tablet Take 1 tablet (10 mg total) by mouth every 6 (six) hours as needed for nausea or vomiting. 30 tablet 0   rosuvastatin (CRESTOR) 20 MG tablet Take 1 tablet (20 mg total) by mouth daily. 90 tablet 3   Testosterone (ANDROGEL) 20.25 MG/1.25GM (1.62%) GEL Place 2 Pump onto the skin as directed. 150 g 5   Testosterone 1.62 % GEL SMARTSIG:81 Milligram(s) Topical Every Morning     vitamin E 180 MG (400 UNITS) capsule Take 400 Units by mouth every evening.     No current facility-administered medications for this visit.    SURGICAL HISTORY:  Past Surgical History:  Procedure Laterality Date   CRANIOTOMY N/A 11/08/2021   Procedure: ENDOSCOPIC ENDONASAL RESECTION OF SELLAR MASS;  Surgeon: Jadene Pierini, MD;  Location: [facility] OR;  Service: Neurosurgery;  Laterality: N/A;  RM 20   IR RADIOLOGIST EVAL & MGMT  07/14/2021   IR RADIOLOGIST EVAL & MGMT  07/27/2021   IR RADIOLOGIST EVAL & MGMT  01/30/2022   RADIOFREQUENCY ABLATION N/A 08/23/2021   Procedure: RENAL CRYO ABLATION;  Surgeon: Berdine Dance, MD;  Location: [facility];  Service: Anesthesiology;  Laterality: N/A;   right shoulder rotator cuff repair Right    ROBOT ASSISTED LAPAROSCOPIC NEPHRECTOMY Left 01/05/2020   Procedure: XI ROBOTIC ASSISTED LAPAROSCOPIC RADICAL NEPHRECTOMY;  Surgeon: Rene Paci, MD;  Location: [facility];  Service: Urology;  Laterality: Left;   TOTAL HIP ARTHROPLASTY Left 03/31/2020   Procedure: LEFT TOTAL HIP ARTHROPLASTY ANTERIOR APPROACH;  Surgeon: Kathryne Hitch, MD;  Location: [facility] OR;  Service: Orthopedics;  Laterality: Left;   TRANSPHENOIDAL APPROACH EXPOSURE N/A 11/08/2021   Procedure: TRANSPHENOIDAL APPROACH EXPOSURE;  Surgeon: Osborn Coho, MD;  Location: [facility] OR;   Service: ENT;  Laterality: N/A;    REVIEW OF SYSTEMS:   Review of Systems  Constitutional: Negative for appetite change, chills, fatigue, fever and unexpected weight change.  HENT:   Negative for mouth sores, nosebleeds, sore throat and trouble swallowing.   Eyes: Negative for eye problems and icterus.  Respiratory: Negative for cough, hemoptysis, shortness of breath and wheezing.   Cardiovascular: Negative for chest pain and leg swelling.  Gastrointestinal: Positive for controlled diarrhea. Negative for abdominal pain, constipation, nausea and vomiting.  Genitourinary: Negative for bladder incontinence, difficulty urinating, dysuria, frequency and hematuria.   Musculoskeletal: Negative for back pain, gait problem, neck pain and neck stiffness.  Skin: Negative for itching and rash.  Neurological: Negative for dizziness, extremity weakness, gait problem, headaches, light-headedness and seizures.  Hematological: Negative for adenopathy. Does not bruise/bleed easily.  Psychiatric/Behavioral: Negative for confusion, depression and sleep disturbance. The patient is not nervous/anxious.  PHYSICAL EXAMINATION:  Blood pressure (!) 142/86, pulse 61, temperature 97.7 F (36.5 C), temperature source Oral, resp. rate 13, weight 218 lb 12.8 oz (99.2 kg), SpO2 99%.  ECOG PERFORMANCE STATUS: 1  Physical Exam  Constitutional: Oriented to person, place, and time and well-developed, well-nourished, and in no distress.  HENT:  Head: Normocephalic and atraumatic.  Mouth/Throat: Oropharynx is clear and moist. No oropharyngeal exudate.  Eyes: Conjunctivae are normal. Right eye exhibits no discharge. Left eye exhibits no discharge. No scleral icterus.  Neck: Normal range of motion. Neck supple.  Cardiovascular: Normal rate, regular rhythm, normal heart sounds and intact distal pulses.   Pulmonary/Chest: Effort normal and breath sounds normal. No respiratory distress. No wheezes. No rales.  Abdominal:  Soft. Bowel sounds are normal. Exhibits no distension and no mass. There is no tenderness.  Musculoskeletal: Normal range of motion. Exhibits no edema.  Lymphadenopathy:    No cervical adenopathy.  Neurological: Alert and oriented to person, place, and time. Exhibits normal muscle tone. Gait normal. Coordination normal.  Skin: Skin is warm and dry. No rash noted. Not diaphoretic. No erythema. No pallor.  Psychiatric: Mood, memory and judgment normal.  Vitals reviewed.  LABORATORY DATA: Lab Results  Component Value Date   WBC 11.6 (H) 01/08/2023   HGB 14.3 01/08/2023   HCT 44.4 01/08/2023   MCV 93.1 01/08/2023   PLT 218 01/08/2023      Chemistry      Component Value Date/Time   NA 141 12/18/2022 1047   NA 140 08/09/2022 1014   K 4.1 12/18/2022 1047   CL 106 12/18/2022 1047   CO2 29 12/18/2022 1047   BUN 16 12/18/2022 1047   BUN 18 08/09/2022 1014   CREATININE 1.16 12/18/2022 1047      Component Value Date/Time   CALCIUM 9.0 12/18/2022 1047   ALKPHOS 63 12/18/2022 1047   AST 34 12/18/2022 1047   ALT 48 (H) 12/18/2022 1047   BILITOT 0.6 12/18/2022 1047       RADIOGRAPHIC STUDIES:  No results found.   ASSESSMENT/PLAN:  This is a very pleasant 67 year old male diagnosed with renal cell carcinoma.  He was initially diagnosed and 2021.  He was found to have metastatic disease with pulmonary involvement and pituitary metastasis and 2023   The patient underwent robot-assisted laparoscopic left radical nephrectomy by Dr. Liliane Shi on 01/05/2020 that showed a 10.7 cm lesion with extension into the perinephric tissue 0 of 4 lymph nodes involved.  The final pathology was T3a, N0, grade 2 tumor.   He then underwent radiation therapy to the pulmonary nodules which was completed in March 2023.   He then underwent cryoablation to 2 lesions in the right kidney which was performed on 08/23/2021   He then underwent SRS treatment to the pituitary bed which was completed in October  2023.   The patient is currently undergoing pembrolizumab 200 mg IV every 3 weeks which is started on 08/04/2021. He is status post 19 cycles.  He is also on Axitnib 5 mg daily which was started in February 09, 2022.    Labs were reviewed. His CMP is pending. As long as this is in parameters, recommend that he proceed with the next cycle of treatment today as scheduled.   Will see him back for follow-up visit in 3 weeks for evaluation repeat blood work before undergoing cycle #21.   We will arrange for a restaging CT scan of the CAP prior to his next cycle of treatment. I have  ordered this without contrast since he only has one kidney.   He saw Dr. Deanne Coffer about MRI vs CT. He typically gets scheduled to see Dr. Miles Costain after next imaging.   He will continue to use imodium for diarrhea.   He can use claritin or zyrtec for nasal congestion if needed. Denies associated symptoms such as fevers or cough.   The patient was advised to call immediately if she has any concerning symptoms in the interval. The patient voices understanding of current disease status and treatment options and is in agreement with the current care plan. All questions were answered. The patient knows to call the clinic with any problems, questions or concerns. We can certainly see the patient much sooner if necessary   Orders Placed This Encounter  Procedures   CT CHEST ABDOMEN PELVIS WO CONTRAST    10/18-10/22    Standing Status:   Future    Standing Expiration Date:   01/08/2024    Order Specific Question:   Preferred imaging location?    Answer:   [facility]    Order Specific Question:   If indicated for the ordered procedure, I authorize the administration of oral contrast media per Radiology protocol    Answer:   Yes    Order Specific Question:   Does the patient have a contrast media/X-ray dye allergy?    Answer:   No    The total time spent in the appointment was 20-29 minutes  Maxtyn Nuzum L  Merlina Marchena, PA-C 01/08/23

## 2023-01-08 ENCOUNTER — Inpatient Hospital Stay (HOSPITAL_BASED_OUTPATIENT_CLINIC_OR_DEPARTMENT_OTHER): Payer: Medicare Other | Admitting: Physician Assistant

## 2023-01-08 ENCOUNTER — Inpatient Hospital Stay: Payer: Medicare Other

## 2023-01-08 ENCOUNTER — Inpatient Hospital Stay: Payer: Medicare Other | Attending: Oncology

## 2023-01-08 VITALS — BP 134/83 | HR 60 | Resp 16

## 2023-01-08 VITALS — BP 142/86 | HR 61 | Temp 97.7°F | Resp 13 | Wt 218.8 lb

## 2023-01-08 DIAGNOSIS — Z79620 Long term (current) use of immunosuppressive biologic: Secondary | ICD-10-CM | POA: Insufficient documentation

## 2023-01-08 DIAGNOSIS — Z923 Personal history of irradiation: Secondary | ICD-10-CM | POA: Diagnosis not present

## 2023-01-08 DIAGNOSIS — Z5112 Encounter for antineoplastic immunotherapy: Secondary | ICD-10-CM | POA: Insufficient documentation

## 2023-01-08 DIAGNOSIS — K8020 Calculus of gallbladder without cholecystitis without obstruction: Secondary | ICD-10-CM | POA: Diagnosis not present

## 2023-01-08 DIAGNOSIS — C642 Malignant neoplasm of left kidney, except renal pelvis: Secondary | ICD-10-CM

## 2023-01-08 DIAGNOSIS — C7800 Secondary malignant neoplasm of unspecified lung: Secondary | ICD-10-CM | POA: Diagnosis not present

## 2023-01-08 DIAGNOSIS — R197 Diarrhea, unspecified: Secondary | ICD-10-CM | POA: Diagnosis not present

## 2023-01-08 LAB — CMP (CANCER CENTER ONLY)
ALT: 36 U/L (ref 0–44)
AST: 31 U/L (ref 15–41)
Albumin: 4.3 g/dL (ref 3.5–5.0)
Alkaline Phosphatase: 61 U/L (ref 38–126)
Anion gap: 9 (ref 5–15)
BUN: 17 mg/dL (ref 8–23)
CO2: 27 mmol/L (ref 22–32)
Calcium: 9 mg/dL (ref 8.9–10.3)
Chloride: 107 mmol/L (ref 98–111)
Creatinine: 1.17 mg/dL (ref 0.61–1.24)
GFR, Estimated: >60 mL/min (ref >60)
Glucose, Bld: 86 mg/dL (ref 70–99)
Potassium: 3.8 mmol/L (ref 3.5–5.1)
Sodium: 143 mmol/L (ref 135–145)
Total Bilirubin: 0.5 mg/dL (ref 0.3–1.2)
Total Protein: 7 g/dL (ref 6.5–8.1)

## 2023-01-08 LAB — CBC WITH DIFFERENTIAL (CANCER CENTER ONLY)
Abs Immature Granulocytes: 0.03 10*3/uL (ref 0.00–0.07)
Basophils Absolute: 0.1 10*3/uL (ref 0.0–0.1)
Basophils Relative: 1 %
Eosinophils Absolute: 0.2 10*3/uL (ref 0.0–0.5)
Eosinophils Relative: 2 %
HCT: 44.4 % (ref 39.0–52.0)
Hemoglobin: 14.3 g/dL (ref 13.0–17.0)
Immature Granulocytes: 0 %
Lymphocytes Relative: 26 %
Lymphs Abs: 3 10*3/uL (ref 0.7–4.0)
MCH: 30 pg (ref 26.0–34.0)
MCHC: 32.2 g/dL (ref 30.0–36.0)
MCV: 93.1 fL (ref 80.0–100.0)
Monocytes Absolute: 1 10*3/uL (ref 0.1–1.0)
Monocytes Relative: 8 %
Neutro Abs: 7.4 10*3/uL (ref 1.7–7.7)
Neutrophils Relative %: 63 %
Platelet Count: 218 10*3/uL (ref 150–400)
RBC: 4.77 MIL/uL (ref 4.22–5.81)
RDW: 15 % (ref 11.5–15.5)
WBC Count: 11.6 10*3/uL — ABNORMAL HIGH (ref 4.0–10.5)
nRBC: 0 % (ref 0.0–0.2)

## 2023-01-08 LAB — TSH: TSH: 0.015 u[IU]/mL — ABNORMAL LOW (ref 0.350–4.500)

## 2023-01-08 MED ORDER — SODIUM CHLORIDE 0.9 % IV SOLN: Freq: Once | INTRAVENOUS | Status: AC

## 2023-01-08 MED ORDER — SODIUM CHLORIDE 0.9% FLUSH: 10.0000 mL | INTRAVENOUS | Status: DC | PRN

## 2023-01-08 MED ORDER — HEPARIN SOD (PORK) LOCK FLUSH 100 UNIT/ML IV SOLN: 500.0000 [IU] | Freq: Once | INTRAVENOUS | Status: DC | PRN

## 2023-01-08 MED ORDER — SODIUM CHLORIDE 0.9 % IV SOLN
200.0000 mg | Freq: Once | INTRAVENOUS | Status: AC
  Administered 2023-01-08: 200 mg via INTRAVENOUS
  Filled 2023-01-08: qty 200

## 2023-01-08 MED ORDER — SODIUM CHLORIDE 0.9 % IV SOLN: INTRAVENOUS | Status: DC

## 2023-01-08 NOTE — Patient Instructions (Signed)
[name]  Discharge Instructions: Thank you for choosing [facility] to provide your oncology and hematology care.   If you have a lab appointment with the [facility], please go directly to the [facility] and check in at the registration area.   Wear comfortable clothing and clothing appropriate for easy access to any Portacath or PICC line.   We strive to give you quality time with your provider. You may need to reschedule your appointment if you arrive late (15 or more minutes).  Arriving late affects you and other patients whose appointments are after yours.  Also, if you miss three or more appointments without notifying the office, you may be dismissed from the clinic at the provider's discretion.      For prescription refill requests, have your pharmacy contact our office and allow 72 hours for refills to be completed.    Today you received the following chemotherapy and/or immunotherapy agents: Keytruda      To help prevent nausea and vomiting after your treatment, we encourage you to take your nausea medication as directed.  BELOW ARE SYMPTOMS THAT SHOULD BE REPORTED IMMEDIATELY: *FEVER GREATER THAN 100.4 F (38 C) OR HIGHER *CHILLS OR SWEATING *NAUSEA AND VOMITING THAT IS NOT CONTROLLED WITH YOUR NAUSEA MEDICATION *UNUSUAL SHORTNESS OF BREATH *UNUSUAL BRUISING OR BLEEDING *URINARY PROBLEMS (pain or burning when urinating, or frequent urination) *BOWEL PROBLEMS (unusual diarrhea, constipation, pain near the anus) TENDERNESS IN MOUTH AND THROAT WITH OR WITHOUT PRESENCE OF ULCERS (sore throat, sores in mouth, or a toothache) UNUSUAL RASH, SWELLING OR PAIN  UNUSUAL VAGINAL DISCHARGE OR ITCHING   Items with * indicate a potential emergency and should be followed up as soon as possible or go to the Emergency Department if any problems should occur.  Please show the CHEMOTHERAPY ALERT CARD or IMMUNOTHERAPY ALERT CARD at  check-in to the Emergency Department and triage nurse.  Should you have questions after your visit or need to cancel or reschedule your appointment, please contact [facility]  Dept: [phone]  and follow the prompts.  Office hours are 8:00 a.m. to 4:30 p.m. Monday - Friday. Please note that voicemails left after 4:00 p.m. may not be returned until the following business day.  We are closed weekends and major holidays. You have access to a nurse at all times for urgent questions. Please call the main number to the clinic Dept: [phone] and follow the prompts.   For any non-urgent questions, you may also contact your provider using MyChart. We now offer e-Visits for anyone 14 and older to request care online for non-urgent symptoms. For details visit [email or web address].   Also download the MyChart app! Go to the app store, search "MyChart", open the app, select [facility] [place], and log in with your MyChart username and password.

## 2023-01-09 ENCOUNTER — Other Ambulatory Visit: Payer: Self-pay | Admitting: Internal Medicine

## 2023-01-09 DIAGNOSIS — C642 Malignant neoplasm of left kidney, except renal pelvis: Secondary | ICD-10-CM

## 2023-01-09 LAB — T4: T4, Total: 7 ug/dL (ref 4.5–12.0)

## 2023-01-21 ENCOUNTER — Ambulatory Visit (HOSPITAL_COMMUNITY)
Admission: RE | Admit: 2023-01-21 | Discharge: 2023-01-21 | Disposition: A | Discharge status: Home / Self Care | Payer: Medicare Other | Source: Ambulatory Visit | Attending: Physician Assistant | Admitting: Physician Assistant

## 2023-01-21 DIAGNOSIS — C642 Malignant neoplasm of left kidney, except renal pelvis: Secondary | ICD-10-CM | POA: Insufficient documentation

## 2023-01-29 ENCOUNTER — Inpatient Hospital Stay (HOSPITAL_BASED_OUTPATIENT_CLINIC_OR_DEPARTMENT_OTHER): Payer: Medicare Other | Admitting: Internal Medicine

## 2023-01-29 ENCOUNTER — Inpatient Hospital Stay: Payer: Medicare Other

## 2023-01-29 VITALS — BP 134/82 | HR 60 | Resp 17

## 2023-01-29 DIAGNOSIS — C642 Malignant neoplasm of left kidney, except renal pelvis: Secondary | ICD-10-CM | POA: Diagnosis not present

## 2023-01-29 DIAGNOSIS — Z5112 Encounter for antineoplastic immunotherapy: Secondary | ICD-10-CM | POA: Diagnosis not present

## 2023-01-29 LAB — CMP (CANCER CENTER ONLY)
ALT: 40 U/L (ref 0–44)
AST: 29 U/L (ref 15–41)
Albumin: 4.2 g/dL (ref 3.5–5.0)
Alkaline Phosphatase: 57 U/L (ref 38–126)
Anion gap: 8 (ref 5–15)
BUN: 17 mg/dL (ref 8–23)
CO2: 26 mmol/L (ref 22–32)
Calcium: 9.3 mg/dL (ref 8.9–10.3)
Chloride: 108 mmol/L (ref 98–111)
Creatinine: 1.16 mg/dL (ref 0.61–1.24)
GFR, Estimated: >60 mL/min (ref >60)
Glucose, Bld: 92 mg/dL (ref 70–99)
Potassium: 3.7 mmol/L (ref 3.5–5.1)
Sodium: 142 mmol/L (ref 135–145)
Total Bilirubin: 0.4 mg/dL (ref 0.3–1.2)
Total Protein: 6.8 g/dL (ref 6.5–8.1)

## 2023-01-29 LAB — CBC WITH DIFFERENTIAL (CANCER CENTER ONLY)
Abs Immature Granulocytes: 0.02 10*3/uL (ref 0.00–0.07)
Basophils Absolute: 0.1 10*3/uL (ref 0.0–0.1)
Basophils Relative: 1 %
Eosinophils Absolute: 0.2 10*3/uL (ref 0.0–0.5)
Eosinophils Relative: 2 %
HCT: 42.5 % (ref 39.0–52.0)
Hemoglobin: 14 g/dL (ref 13.0–17.0)
Immature Granulocytes: 0 %
Lymphocytes Relative: 31 %
Lymphs Abs: 2.8 10*3/uL (ref 0.7–4.0)
MCH: 29.9 pg (ref 26.0–34.0)
MCHC: 32.9 g/dL (ref 30.0–36.0)
MCV: 90.8 fL (ref 80.0–100.0)
Monocytes Absolute: 0.8 10*3/uL (ref 0.1–1.0)
Monocytes Relative: 9 %
Neutro Abs: 5.1 10*3/uL (ref 1.7–7.7)
Neutrophils Relative %: 57 %
Platelet Count: 212 10*3/uL (ref 150–400)
RBC: 4.68 MIL/uL (ref 4.22–5.81)
RDW: 14.7 % (ref 11.5–15.5)
WBC Count: 8.8 10*3/uL (ref 4.0–10.5)
nRBC: 0 % (ref 0.0–0.2)

## 2023-01-29 LAB — TSH: TSH: <0.01 u[IU]/mL — ABNORMAL LOW (ref 0.350–4.500)

## 2023-01-29 MED ORDER — SODIUM CHLORIDE 0.9 % IV SOLN: Freq: Once | INTRAVENOUS | Status: AC

## 2023-01-29 MED ORDER — SODIUM CHLORIDE 0.9 % IV SOLN
200.0000 mg | Freq: Once | INTRAVENOUS | Status: AC
  Administered 2023-01-29: 200 mg via INTRAVENOUS
  Filled 2023-01-29: qty 200

## 2023-01-29 NOTE — Progress Notes (Signed)
[facility] [phone]   [phone]  OFFICE PROGRESS NOTE  [name], [name], [place] 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    PRIOR THERAPY: 1) status post robotic assisted laparoscopic left radical nephrectomy by Dr. Liliane Shi on January 05, 2020.  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  The final pathological staging was T3aN0 grade 2 tumor.     2) Status post radiation therapy to the pulmonary nodules completed March 2023.  He received 50 Gray in 5 fractions.   3) Status post cryoablation to 2 lesions in the right kidney completed on Aug 23, 2021.   4) status post endoscopic transsphenoidal pituitary resection completed by Dr. Annalee Genta on November 08, 2021.  The final pathology showed clear-cell renal cell carcinoma.   5) SRS treatment to pituitary bed completed in October 2023.  He received 5 fractions for a total of 25 Gray.  CURRENT THERAPY: Keytruda 200 Mg IV every 3 weeks started Aug 04, 2021 status post 18 cycles with axitinib 5 mg p.o. daily started February 09, 2022.  INTERVAL HISTORY: KAYLUP KNABLE 67 y.o. male returns to the clinic today for follow-up visit. Discussed the use of AI scribe software for clinical note transcription with the patient, who gave verbal consent to proceed.  History of Present Illness   The patient, a 67 year old with a history of stage four left renal cell carcinoma diagnosed 3 years ago, underwent a left nephrectomy and has metastasis to the pituitary gland and lungs. They received radiation therapy to both the pituitary gland and lungs. Currently, they are on a regimen of Keytruda every three weeks and daily Inlyta (Axitinib), with a total of twenty cycles  of Keytruda completed so far.  The patient reports no significant changes in their condition, with the exception of diarrhea, which occurs once or twice daily. This symptom is managed with Imodium, allowing the patient to schedule their day around it. The patient denies any other side effects from the treatment.  The patient also reports a deliberate weight loss effort, with a reduction from 250 to 218 pounds. They deny any unintentional weight loss, pain in the upper part of the abdomen, or any other new symptoms.       MEDICAL HISTORY: Past Medical History:  Diagnosis Date   Arthritis    Cancer (HCC)    Kidney, Lung, Pituitary   Hyperlipidemia    Hypothyroidism     ALLERGIES:  has No Known Allergies.  MEDICATIONS:  Current Outpatient Medications  Medication Sig Dispense Refill   Ascorbic Acid (VITAMIN C PO) Take 1 tablet by mouth every evening.     aspirin EC 81 MG tablet Take 81 mg by mouth every evening. Swallow whole.     chlorhexidine (PERIDEX) 0.12 % solution SMARTSIG:By Mouth     cholecalciferol (VITAMIN D3) 25 MCG (1000 UNIT) tablet Take 1,000 Units by mouth every evening.     Ferrous Sulfate (IRON SLOW RELEASE PO) Take 1 tablet by mouth every evening.     HYDROcodone-acetaminophen (NORCO) 10-325 MG tablet Take by mouth.     ibuprofen (ADVIL) 200 MG tablet Take 400-600 mg by mouth every 6 (six) hours as needed for moderate pain.     INLYTA 5 MG  tablet TAKE 1 TABLET BY MOUTH DAILY 30 tablet 3   levothyroxine (SYNTHROID) 88 MCG tablet Take 1 tablet (88 mcg total) by mouth daily. 90 tablet 3   Multiple Vitamin (MULTI VITAMIN) TABS 1 tablet Orally Once a day for 30 day(s)     Multiple Vitamins-Minerals (MULTIVITAMIN WITH MINERALS) tablet Take 1 tablet by mouth every evening.     Omega-3 Fatty Acids (FISH OIL PO) Take 1 capsule by mouth every evening.     ondansetron (ZOFRAN-ODT) 8 MG disintegrating tablet Take 8 mg by mouth daily.     predniSONE (DELTASONE) 5 MG tablet Take  1.5 tablets (7.5 mg total) by mouth daily with breakfast. Pt may double up during sick days 150 tablet 3   prochlorperazine (COMPAZINE) 10 MG tablet Take 1 tablet (10 mg total) by mouth every 6 (six) hours as needed for nausea or vomiting. 30 tablet 0   rosuvastatin (CRESTOR) 20 MG tablet Take 1 tablet (20 mg total) by mouth daily. 90 tablet 3   Testosterone (ANDROGEL) 20.25 MG/1.25GM (1.62%) GEL Place 2 Pump onto the skin as directed. 150 g 5   Testosterone 1.62 % GEL SMARTSIG:81 Milligram(s) Topical Every Morning     vitamin E 180 MG (400 UNITS) capsule Take 400 Units by mouth every evening.     No current facility-administered medications for this visit.    SURGICAL HISTORY:  Past Surgical History:  Procedure Laterality Date   CRANIOTOMY N/A 11/08/2021   Procedure: ENDOSCOPIC ENDONASAL RESECTION OF SELLAR MASS;  Surgeon: Jadene Pierini, MD;  Location: [facility] OR;  Service: Neurosurgery;  Laterality: N/A;  RM 20   IR RADIOLOGIST EVAL & MGMT  07/14/2021   IR RADIOLOGIST EVAL & MGMT  07/27/2021   IR RADIOLOGIST EVAL & MGMT  01/30/2022   RADIOFREQUENCY ABLATION N/A 08/23/2021   Procedure: RENAL CRYO ABLATION;  Surgeon: Berdine Dance, MD;  Location: [facility] ORS;  Service: Anesthesiology;  Laterality: N/A;   right shoulder rotator cuff repair Right    ROBOT ASSISTED LAPAROSCOPIC NEPHRECTOMY Left 01/05/2020   Procedure: XI ROBOTIC ASSISTED LAPAROSCOPIC RADICAL NEPHRECTOMY;  Surgeon: Rene Paci, MD;  Location: [facility];  Service: Urology;  Laterality: Left;   TOTAL HIP ARTHROPLASTY Left 03/31/2020   Procedure: LEFT TOTAL HIP ARTHROPLASTY ANTERIOR APPROACH;  Surgeon: Kathryne Hitch, MD;  Location: [facility] OR;  Service: Orthopedics;  Laterality: Left;   TRANSPHENOIDAL APPROACH EXPOSURE N/A 11/08/2021   Procedure: TRANSPHENOIDAL APPROACH EXPOSURE;  Surgeon: Osborn Coho, MD;  Location: [facility] OR;  Service: ENT;  Laterality: N/A;    REVIEW OF SYSTEMS:  Constitutional: negative Eyes:  negative Ears, nose, mouth, throat, and face: negative Respiratory: negative Cardiovascular: negative Gastrointestinal: positive for diarrhea Genitourinary:negative Integument/breast: negative Hematologic/lymphatic: negative Musculoskeletal:negative Neurological: negative Behavioral/Psych: negative Endocrine: negative Allergic/Immunologic: negative   PHYSICAL EXAMINATION: General appearance: alert, cooperative, and no distress Head: Normocephalic, without obvious abnormality, atraumatic Neck: no adenopathy, no JVD, supple, symmetrical, trachea midline, and thyroid not enlarged, symmetric, no tenderness/mass/nodules Lymph nodes: Cervical, supraclavicular, and axillary nodes normal. Resp: clear to auscultation bilaterally Back: symmetric, no curvature. ROM normal. No CVA tenderness. Cardio: regular rate and rhythm, S1, S2 normal, no murmur, click, rub or gallop GI: soft, non-tender; bowel sounds normal; no masses,  no organomegaly Extremities: extremities normal, atraumatic, no cyanosis or edema Neurologic: Alert and oriented X 3, normal strength and tone. Normal symmetric reflexes. Normal coordination and gait  ECOG PERFORMANCE STATUS: 1 - Symptomatic but completely ambulatory  Blood pressure (!) 130/90, pulse 64, temperature (!) 97.5  F (36.4 C), temperature source Oral, resp. rate 17, weight 218 lb 8 oz (99.1 kg), SpO2 99%.  LABORATORY DATA: Lab Results  Component Value Date   WBC 8.8 01/29/2023   HGB 14.0 01/29/2023   HCT 42.5 01/29/2023   MCV 90.8 01/29/2023   PLT 212 01/29/2023      Chemistry      Component Value Date/Time   NA 143 01/08/2023 1037   NA 140 08/09/2022 1014   K 3.8 01/08/2023 1037   CL 107 01/08/2023 1037   CO2 27 01/08/2023 1037   BUN 17 01/08/2023 1037   BUN 18 08/09/2022 1014   CREATININE 1.17 01/08/2023 1037      Component Value Date/Time   CALCIUM 9.0 01/08/2023 1037   ALKPHOS 61 01/08/2023 1037   AST 31 01/08/2023 1037   ALT 36  01/08/2023 1037   BILITOT 0.5 01/08/2023 1037       RADIOGRAPHIC STUDIES: CT CHEST ABDOMEN PELVIS WO CONTRAST  Result Date: 01/28/2023 CLINICAL DATA:  Restaging metastatic renal cell carcinoma * Tracking Code: BO * EXAM: CT CHEST, ABDOMEN AND PELVIS WITHOUT CONTRAST TECHNIQUE: Multidetector CT imaging of the chest, abdomen and pelvis was performed following the standard protocol without IV contrast. RADIATION DOSE REDUCTION: This exam was performed according to the departmental dose-optimization program which includes automated exposure control, adjustment of the mA and/or kV according to patient size and/or use of iterative reconstruction technique. COMPARISON:  11/02/2022 FINDINGS: CT CHEST FINDINGS Cardiovascular: Coronary, aortic arch, and branch vessel atherosclerotic vascular disease. Mediastinum/Nodes: Unremarkable Lungs/Pleura: Stable band of airspace opacity tracking along the major and minor fissure primarily along the upper lobe and middle lobe as on image 48 series 6, measuring about 3.2 by 1.1 by 2.4 cm. Similar appearance of opacity along the right hemidiaphragm measuring about 9 mm in thickness, similar to previous. Pleural-based nodule medially along the right lower lobe measures 2.7 by 1.5 cm on image 47 series 2, formerly 2.8 by 1.5 cm. This is essentially stable. Musculoskeletal: Thoracic spondylosis. CT ABDOMEN PELVIS FINDINGS Hepatobiliary: Unremarkable noncontrast CT appearance of the hepatic parenchyma. Small layering gallstone in the gallbladder. Pancreas: Unremarkable Spleen: Unremarkable Adrenals/Urinary Tract: Stable thickened appearance the left adrenal gland. Right adrenal gland unremarkable. Left nephrectomy. Ablation site along the right kidney upper pole unchanged. Stomach/Bowel: Sigmoid colon diverticulosis. Vascular/Lymphatic: Atherosclerosis is present, including aortoiliac atherosclerotic disease. No pathologic adenopathy. Reproductive: Unremarkable Other: No  supplemental non-categorized findings. Musculoskeletal: Left total hip prosthesis, potentially some particulate disease in the left upper acetabulum on image 108 series 2. Lumbar spondylosis and degenerative disc disease causing multilevel impingement. IMPRESSION: 1. Stable dominant right pleural nodule. Stable regions of bandlike opacity along the right major fissure and right diaphragm. 2. Stable thickened appearance of the left adrenal gland. 3. Ablation site along the right kidney upper pole unchanged in noncontrast appearance. 4. Left nephrectomy. 5. Cholelithiasis. 6. Sigmoid colon diverticulosis. 7. Lumbar spondylosis and degenerative disc disease causing multilevel impingement. 8. Aortic atherosclerosis. Aortic Atherosclerosis (ICD10-I70.0). Electronically Signed   By: Gaylyn Rong M.D.   On: 01/28/2023 17:22    ASSESSMENT AND PLAN: This is a very pleasant 67 years old white male diagnosed with stage IV clear-cell renal cell carcinoma with pulmonary involvement as well as pituitary metastasis in 2023.  The patient was initially diagnosed as stage III and October 2021 status post robotic assisted laparoscopic left radical nephrectomy followed by radiation therapy to the pulmonary nodules in March 2023 followed by cryoablation to 2 lesions in the right kidney  completed in May 2023.  He is also status post endoscopic transsphenoidal pituitary resection on November 08, 2021.  He is also status post SRS to the pituitary bed in October 2023. The patient is currently undergoing treatment with Keytruda 200 Mg IV every 3 weeks status post 20 cycles.  This was started on Aug 04, 2021.  He is also on axitinib 5 mg p.o. daily since November 2023.  The patient has been tolerating his treatment well with no concerning adverse effects except for the few episodes of diarrhea managed with Imodium. He had repeat CT scan of the chest, abdomen and pelvis performed recently.  I personally and independently reviewed the  scan and discussed the result with the patient today. His scan showed stable disease with no concerning findings of progression.    Stage 4 Renal Cell Carcinoma with metastasis to the pituitary gland and lungs Stable disease on recent imaging. Currently on cycle 21 of planned 35 cycles of Keytruda every three weeks and daily Inlyta (Xitinib). Experiencing manageable diarrhea as a side effect. -Continue Keytruda and Inlyta as prescribed. -Manage diarrhea with Imodium as needed.  Gallstones Incidental finding on recent imaging. No associated symptoms. -No intervention required at this time.  Weight Management Patient is actively trying to lose weight and has had some success. -Encourage continued efforts.   The patient was advised to call immediately if he has any other concerning symptoms in the interval. The patient voices understanding of current disease status and treatment options and is in agreement with the current care plan.  All questions were answered. The patient knows to call the clinic with any problems, questions or concerns. We can certainly see the patient much sooner if necessary.  The total time spent in the appointment was 30 minutes.  Disclaimer: This note was dictated with voice recognition software. Similar sounding words can inadvertently be transcribed and may not be corrected upon review.

## 2023-01-29 NOTE — Patient Instructions (Signed)

## 2023-01-30 ENCOUNTER — Ambulatory Visit
Admission: RE | Admit: 2023-01-30 | Discharge: 2023-01-30 | Disposition: A | Discharge status: Home / Self Care | Payer: Medicare Other | Source: Ambulatory Visit | Attending: Radiation Oncology | Admitting: Radiation Oncology

## 2023-01-30 DIAGNOSIS — C7931 Secondary malignant neoplasm of brain: Secondary | ICD-10-CM

## 2023-01-30 LAB — T4: T4, Total: 6.4 ug/dL (ref 4.5–12.0)

## 2023-01-30 MED ORDER — GADOPICLENOL 0.5 MMOL/ML IV SOLN
10.0000 mL | Freq: Once | INTRAVENOUS | Status: AC | PRN
  Administered 2023-01-30: 10 mL via INTRAVENOUS

## 2023-01-31 ENCOUNTER — Telehealth: Payer: Self-pay

## 2023-01-31 NOTE — Telephone Encounter (Signed)
Testosterone 1.62% gel  requested

## 2023-02-01 MED ORDER — TESTOSTERONE 20.25 MG/1.25GM (1.62%) TD GEL: 3.0000 | TRANSDERMAL | 5 refills | Status: DC

## 2023-02-04 ENCOUNTER — Inpatient Hospital Stay: Payer: Medicare Other

## 2023-02-04 ENCOUNTER — Inpatient Hospital Stay: Payer: Medicare Other | Attending: Oncology | Admitting: Internal Medicine

## 2023-02-04 VITALS — BP 125/79 | HR 69 | Temp 97.5°F | Resp 20 | Wt 218.8 lb

## 2023-02-04 DIAGNOSIS — Z5112 Encounter for antineoplastic immunotherapy: Secondary | ICD-10-CM | POA: Diagnosis present

## 2023-02-04 DIAGNOSIS — K5730 Diverticulosis of large intestine without perforation or abscess without bleeding: Secondary | ICD-10-CM | POA: Insufficient documentation

## 2023-02-04 DIAGNOSIS — I700 Atherosclerosis of aorta: Secondary | ICD-10-CM | POA: Diagnosis not present

## 2023-02-04 DIAGNOSIS — C7931 Secondary malignant neoplasm of brain: Secondary | ICD-10-CM | POA: Insufficient documentation

## 2023-02-04 DIAGNOSIS — Z79620 Long term (current) use of immunosuppressive biologic: Secondary | ICD-10-CM | POA: Insufficient documentation

## 2023-02-04 DIAGNOSIS — C641 Malignant neoplasm of right kidney, except renal pelvis: Secondary | ICD-10-CM | POA: Insufficient documentation

## 2023-02-04 DIAGNOSIS — R911 Solitary pulmonary nodule: Secondary | ICD-10-CM | POA: Insufficient documentation

## 2023-02-04 DIAGNOSIS — Z905 Acquired absence of kidney: Secondary | ICD-10-CM | POA: Insufficient documentation

## 2023-02-04 NOTE — Progress Notes (Signed)
[facility] at [facility] [address]  [place], [place] [address] [phone]   New Patient Evaluation  Date of Service: 02/04/23 Patient Name: Marc [name] Patient MRN: [id number] Patient DOB: 10/21/1955 Provider: Henreitta Leber, MD  Identifying Statement:  Marc [name] is a 67 y.o. 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    Referring Provider: Milus Height, PA [address] [place],  [place] [address]  Primary Cancer:  Oncologic History: Oncology History  Kidney cancer, primary, with metastasis from kidney to other site [facility])  06/01/2021 Initial Diagnosis   Kidney cancer, primary, with metastasis from kidney to other site [facility])   06/01/2021 Cancer Staging   Staging form: Kidney, AJCC 8th Edition - Clinical: Stage IV (cT3a, cN0, cM1) - Signed by Benjiman Core, MD on 06/01/2021   12/05/2021 -  Chemotherapy   Patient is on Treatment Plan : Renal Cell Carcinoma Pembrolizumab (200) q21d     Renal cell carcinoma of right kidney (HCC)  06/13/2021 Initial Diagnosis   Renal cell carcinoma of right kidney (HCC)   12/05/2021 - 12/05/2021 Chemotherapy   Patient is on Treatment Plan : HEAD/NECK Pembrolizumab (200) q21d      CNS Oncologic History 11/08/21: Debulking of pituitary mass with Dr. Maurice Small, path is renal cell carcinoma 01/22/22: Completes SRS 25/5 with Dr. Kathrynn Running  History of Present Illness: The patient's records from the referring physician were obtained and reviewed and the patient interviewed to confirm this HPI.  Marc [name] presents today for evaluation following MRI brain.  He denies any worsening of his vision.  Gait is independent, no headaches or seizures.  Continues on Keytruda with Dr. Arbutus Ped for RCC, tolerating well aside from diarrhea.  Medications: Current Outpatient Medications on File Prior to Visit   Medication Sig Dispense Refill   Ascorbic Acid (VITAMIN C PO) Take 1 tablet by mouth every evening.     aspirin EC 81 MG tablet Take 81 mg by mouth every evening. Swallow whole.     chlorhexidine (PERIDEX) 0.12 % solution SMARTSIG:By Mouth     cholecalciferol (VITAMIN D3) 25 MCG (1000 UNIT) tablet Take 1,000 Units by mouth every evening.     Ferrous Sulfate (IRON SLOW RELEASE PO) Take 1 tablet by mouth every evening.     HYDROcodone-acetaminophen (NORCO) 10-325 MG tablet Take by mouth.     ibuprofen (ADVIL) 200 MG tablet Take 400-600 mg by mouth every 6 (six) hours as needed for moderate pain.     INLYTA 5 MG tablet TAKE 1 TABLET BY MOUTH DAILY 30 tablet 3   levothyroxine (SYNTHROID) 88 MCG tablet Take 1 tablet (88 mcg total) by mouth daily. 90 tablet 3   Multiple Vitamin (MULTI VITAMIN) TABS 1 tablet Orally Once a day for 30 day(s)     Multiple Vitamins-Minerals (MULTIVITAMIN WITH MINERALS) tablet Take 1 tablet by mouth every evening.     Omega-3 Fatty Acids (FISH OIL PO) Take 1 capsule by mouth every evening.     ondansetron (ZOFRAN-ODT) 8 MG disintegrating tablet Take 8 mg by mouth daily.     predniSONE (DELTASONE) 5 MG tablet Take 1.5 tablets (7.5 mg total) by mouth daily with breakfast. Pt may double up during sick days 150 tablet 3   prochlorperazine (COMPAZINE) 10 MG tablet Take 1 tablet (10 mg total) by mouth every 6 (six) hours as needed for nausea or vomiting. 30  tablet 0   rosuvastatin (CRESTOR) 20 MG tablet Take 1 tablet (20 mg total) by mouth daily. 90 tablet 3   Testosterone (ANDROGEL) 20.25 MG/1.25GM (1.62%) GEL Place 3 Pump onto the skin as directed. 150 g 5   Testosterone 1.62 % GEL SMARTSIG:81 Milligram(s) Topical Every Morning     vitamin E 180 MG (400 UNITS) capsule Take 400 Units by mouth every evening.     No current facility-administered medications on file prior to visit.    Allergies: No Known Allergies Past Medical History:  Past Medical History:  Diagnosis Date    Arthritis    Cancer (HCC)    Kidney, Lung, Pituitary   Hyperlipidemia    Hypothyroidism    Past Surgical History:  Past Surgical History:  Procedure Laterality Date   CRANIOTOMY N/A 11/08/2021   Procedure: ENDOSCOPIC ENDONASAL RESECTION OF SELLAR MASS;  Surgeon: Jadene Pierini, MD;  Location: [facility] OR;  Service: Neurosurgery;  Laterality: N/A;  RM 20   IR RADIOLOGIST EVAL & MGMT  07/14/2021   IR RADIOLOGIST EVAL & MGMT  07/27/2021   IR RADIOLOGIST EVAL & MGMT  01/30/2022   RADIOFREQUENCY ABLATION N/A 08/23/2021   Procedure: RENAL CRYO ABLATION;  Surgeon: Berdine Dance, MD;  Location: [facility] ORS;  Service: Anesthesiology;  Laterality: N/A;   right shoulder rotator cuff repair Right    ROBOT ASSISTED LAPAROSCOPIC NEPHRECTOMY Left 01/05/2020   Procedure: XI ROBOTIC ASSISTED LAPAROSCOPIC RADICAL NEPHRECTOMY;  Surgeon: Rene Paci, MD;  Location: [facility] ORS;  Service: Urology;  Laterality: Left;   TOTAL HIP ARTHROPLASTY Left 03/31/2020   Procedure: LEFT TOTAL HIP ARTHROPLASTY ANTERIOR APPROACH;  Surgeon: Kathryne Hitch, MD;  Location: [facility] OR;  Service: Orthopedics;  Laterality: Left;   TRANSPHENOIDAL APPROACH EXPOSURE N/A 11/08/2021   Procedure: TRANSPHENOIDAL APPROACH EXPOSURE;  Surgeon: Osborn Coho, MD;  Location: [facility] OR;  Service: ENT;  Laterality: N/A;   Social History:  Social History   Socioeconomic History   Marital status: Single    Spouse name: Not on file   Number of children: 2   Years of education: Not on file   Highest education level: Not on file  Occupational History   Not on file  Tobacco Use   Smoking status: Former    Current packs/day: 0.00    Types: Cigarettes    Start date: 04/02/1985    Quit date: 04/03/2015    Years since quitting: 7.8   Smokeless tobacco: Never  Vaping Use   Vaping status: Former   Quit date: 04/16/2021   Substances: Nicotine, Flavoring  Substance and Sexual Activity   Alcohol use: Yes    Comment: occas    Drug use: No    Sexual activity: Not on file  Other Topics Concern   Not on file  Social History Narrative   Not on file   Social Determinants of Health   Financial Resource Strain: Not on file  Food Insecurity: No Food Insecurity (11/01/2022)   Hunger Vital Sign    Worried About Running Out of Food in the Last Year: Never true    Ran Out of Food in the Last Year: Never true  Transportation Needs: No Transportation Needs (11/01/2022)   PRAPARE - Administrator, Civil Service (Medical): No    Lack of Transportation (Non-Medical): No  Physical Activity: Not on file  Stress: Not on file  Social Connections: Not on file  Intimate Partner Violence: Not At Risk (11/01/2022)   Humiliation, Afraid, Rape, and Kick  questionnaire    Fear of Current or Ex-Partner: No    Emotionally Abused: No    Physically Abused: No    Sexually Abused: No   Family History: No family history on file.  Review of Systems: Constitutional: Doesn't report fevers, chills or abnormal weight loss Eyes: Doesn't report blurriness of vision Ears, nose, mouth, throat, and face: Doesn't report sore throat Respiratory: Doesn't report cough, dyspnea or wheezes Cardiovascular: Doesn't report palpitation, chest discomfort  Gastrointestinal:  Doesn't report nausea, constipation, diarrhea GU: Doesn't report incontinence Skin: Doesn't report skin rashes Neurological: Per HPI Musculoskeletal: Doesn't report joint pain Behavioral/Psych: Doesn't report anxiety  Physical Exam: Vitals:   02/04/23 1109  BP: 125/79  Pulse: 69  Resp: 20  Temp: (!) 97.5 F (36.4 C)  SpO2: 99%   KPS: 90. General: Alert, cooperative, pleasant, in no acute distress Head: Normal EENT: No conjunctival injection or scleral icterus.  Lungs: Resp effort normal Cardiac: Regular rate Abdomen: Non-distended abdomen Skin: No rashes cyanosis or petechiae. Extremities: No clubbing or edema  Neurologic Exam: Mental Status: Awake, alert, attentive to  examiner. Oriented to self and environment. Language is fluent with intact comprehension.  Cranial Nerves: Visual acuity is grossly normal. Visual fields are full. Extra-ocular movements intact. No ptosis. Face is symmetric Motor: Tone and bulk are normal. Power is full in both arms and legs. Reflexes are symmetric, no pathologic reflexes present.  Sensory: Intact to light touch Gait: Normal.   Labs: I have reviewed the data as listed    Component Value Date/Time   NA 142 01/29/2023 0912   NA 140 08/09/2022 1014   K 3.7 01/29/2023 0912   CL 108 01/29/2023 0912   CO2 26 01/29/2023 0912   GLUCOSE 92 01/29/2023 0912   BUN 17 01/29/2023 0912   BUN 18 08/09/2022 1014   CREATININE 1.16 01/29/2023 0912   CALCIUM 9.3 01/29/2023 0912   PROT 6.8 01/29/2023 0912   ALBUMIN 4.2 01/29/2023 0912   AST 29 01/29/2023 0912   ALT 40 01/29/2023 0912   ALKPHOS 57 01/29/2023 0912   BILITOT 0.4 01/29/2023 0912   GFRNONAA >60 01/29/2023 0912   GFRAA >60 01/05/2020 0550   Lab Results  Component Value Date   WBC 8.8 01/29/2023   NEUTROABS 5.1 01/29/2023   HGB 14.0 01/29/2023   HCT 42.5 01/29/2023   MCV 90.8 01/29/2023   PLT 212 01/29/2023    Imaging:  MR Brain W Wo Contrast  Result Date: 01/30/2023 CLINICAL DATA:  Brain metastases, assess treatment response 3T SRS Pituitary protocol. Pt has decreased visual fields p Tx. Please evaluate optic chiasm for possible scaring to explain this EXAM: MRI HEAD WITHOUT AND WITH CONTRAST TECHNIQUE: Multiplanar, multiecho pulse sequences of the brain and surrounding structures were obtained without and with intravenous contrast. CONTRAST:  10 ml Vueway COMPARISON:  Brain MR 10/29/22 FINDINGS: Brain: Negative for an acute infarct. No hemorrhage. No hydrocephalus. No extra-axial fluid collection. Background of mild chronic microvascular ischemic change. Redemonstrated asymmetric contrast-enhancing soft tissue in the right aspect of the sella, unchanged from prior  exam. There is rightward deviation of the pituitary stalk. There are no new contrast-enhancing lesions. Vascular: Normal flow voids. Skull and upper cervical spine: Normal marrow signal. Sinuses/Orbits: No middle ear or mastoid effusion. Mucosal thickening bilateral maxillary and ethmoid sinuses. Orbits are unremarkable. Other: None. IMPRESSION: 1. Unchanged asymmetric contrast-enhancing soft tissue in the right aspect of the sella. 2. No new contrast-enhancing lesions. Electronically Signed   By: Lorenza Cambridge  M.D.   On: 01/30/2023 15:04   CT CHEST ABDOMEN PELVIS WO CONTRAST  Result Date: 01/28/2023 CLINICAL DATA:  Restaging metastatic renal cell carcinoma * Tracking Code: BO * EXAM: CT CHEST, ABDOMEN AND PELVIS WITHOUT CONTRAST TECHNIQUE: Multidetector CT imaging of the chest, abdomen and pelvis was performed following the standard protocol without IV contrast. RADIATION DOSE REDUCTION: This exam was performed according to the departmental dose-optimization program which includes automated exposure control, adjustment of the mA and/or kV according to patient size and/or use of iterative reconstruction technique. COMPARISON:  11/02/2022 FINDINGS: CT CHEST FINDINGS Cardiovascular: Coronary, aortic arch, and branch vessel atherosclerotic vascular disease. Mediastinum/Nodes: Unremarkable Lungs/Pleura: Stable band of airspace opacity tracking along the major and minor fissure primarily along the upper lobe and middle lobe as on image 48 series 6, measuring about 3.2 by 1.1 by 2.4 cm. Similar appearance of opacity along the right hemidiaphragm measuring about 9 mm in thickness, similar to previous. Pleural-based nodule medially along the right lower lobe measures 2.7 by 1.5 cm on image 47 series 2, formerly 2.8 by 1.5 cm. This is essentially stable. Musculoskeletal: Thoracic spondylosis. CT ABDOMEN PELVIS FINDINGS Hepatobiliary: Unremarkable noncontrast CT appearance of the hepatic parenchyma. Small layering  gallstone in the gallbladder. Pancreas: Unremarkable Spleen: Unremarkable Adrenals/Urinary Tract: Stable thickened appearance the left adrenal gland. Right adrenal gland unremarkable. Left nephrectomy. Ablation site along the right kidney upper pole unchanged. Stomach/Bowel: Sigmoid colon diverticulosis. Vascular/Lymphatic: Atherosclerosis is present, including aortoiliac atherosclerotic disease. No pathologic adenopathy. Reproductive: Unremarkable Other: No supplemental non-categorized findings. Musculoskeletal: Left total hip prosthesis, potentially some particulate disease in the left upper acetabulum on image 108 series 2. Lumbar spondylosis and degenerative disc disease causing multilevel impingement. IMPRESSION: 1. Stable dominant right pleural nodule. Stable regions of bandlike opacity along the right major fissure and right diaphragm. 2. Stable thickened appearance of the left adrenal gland. 3. Ablation site along the right kidney upper pole unchanged in noncontrast appearance. 4. Left nephrectomy. 5. Cholelithiasis. 6. Sigmoid colon diverticulosis. 7. Lumbar spondylosis and degenerative disc disease causing multilevel impingement. 8. Aortic atherosclerosis. Aortic Atherosclerosis (ICD10-I70.0). Electronically Signed   By: Gaylyn Rong M.D.   On: 01/28/2023 17:22    [facility] Clinician Interpretation: I have personally reviewed the radiological images as listed.  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  We spent twenty additional minutes teaching regarding the natural history, biology, and historical experience in the treatment of neurologic complications of cancer.   We appreciate the opportunity to participate in the care of Marc [name].   We ask that Marc [name]  return to clinic in 4 months following next brain MRI, or sooner as needed.  All questions were answered. The patient knows to call the clinic with any problems, questions or concerns. No barriers to learning were detected.  The total time spent in the encounter was 40 minutes and more than 50% was on counseling and review of test results   Henreitta Leber, MD Medical Director of Neuro-Oncology [facility] at [facility] 02/04/23 11:19 AM

## 2023-02-08 ENCOUNTER — Other Ambulatory Visit: Payer: Self-pay | Admitting: Radiation Therapy

## 2023-02-09 ENCOUNTER — Telehealth: Payer: Self-pay | Admitting: Internal Medicine

## 2023-02-09 NOTE — Telephone Encounter (Signed)
Scheduled per 11/05 los, patient has been called and notified. Patient is also aware of upcoming treatment appointments.

## 2023-02-15 NOTE — Progress Notes (Unsigned)
[facility] OFFICE PROGRESS NOTE  [name], [place], [name] 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 Liliane Shi on January 05, 2020.  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  The final pathological staging was T3aN0 grade 2 tumor.     2) Status post radiation therapy to the pulmonary nodules completed March 2023.  He received 50 Gray in 5 fractions.   3) Status post cryoablation to 2 lesions in the right kidney completed on Aug 23, 2021.   4) status post endoscopic transsphenoidal pituitary resection completed by Dr. Annalee Genta on November 08, 2021.  The final pathology showed clear-cell renal cell carcinoma.   5) SRS treatment to pituitary bed completed in October 2023.  He received 5 fractions for a total of 25 Gray.  CURRENT THERAPY: Keytruda 200 Mg IV every 3 weeks started Aug 04, 2021 status post 21 cycles with axitinib 5 mg p.o. daily started February 09, 2022.   INTERVAL HISTORY: Marc [name] 67 y.o. male returns for a follow-up visit.  He is feeling fairly well today without any concerning complaints. He is currently on treatment with  Keytruda every 3 weeks and Axitinib p.o. daily.  The patient is tolerating treatment well without any concerning adverse side effects except for diarrhea but he states he has this under control with imodium which he takes daily. The patient denies any recent fever, chills, night sweats, unexplained weight loss. He is trying to lose weight with walking 4-5 miles per day and not eating after 8 PM. He denies any appetite changes. He previously had mildly dry/rough skin which improved with udderly smooth  cream. Denies any chest pain, shortness of breath, cough, or hemoptysis.  Denies any nausea, vomiting, or constipation. Denies any dysuria, malodorous urine, urinary frequency or urgency.  Denies any headache or visual changes.  He is followed by Dr. Barbaraann Cao and IR. He is here for evaluation and repeat blood work before undergoing cycle #22.   MEDICAL HISTORY: Past Medical History:  Diagnosis Date   Arthritis    Cancer (HCC)    Kidney, Lung, Pituitary   Hyperlipidemia    Hypothyroidism     ALLERGIES:  has No Known Allergies.  MEDICATIONS:  Current Outpatient Medications  Medication Sig Dispense Refill   Ascorbic Acid (VITAMIN C PO) Take 1 tablet by mouth every evening.     aspirin EC 81 MG tablet Take 81 mg by mouth every evening. Swallow whole.     chlorhexidine (PERIDEX) 0.12 % solution SMARTSIG:By Mouth     cholecalciferol (VITAMIN D3) 25 MCG (1000 UNIT) tablet Take 1,000 Units by mouth every evening.     Ferrous Sulfate (IRON SLOW RELEASE PO) Take 1 tablet by mouth every evening.     HYDROcodone-acetaminophen (NORCO) 10-325 MG tablet Take by mouth.     ibuprofen (ADVIL) 200 MG tablet Take 400-600 mg by mouth every 6 (six) hours as needed for moderate pain.     INLYTA 5 MG tablet TAKE 1 TABLET BY MOUTH DAILY 30 tablet 3   levothyroxine (SYNTHROID) 88 MCG tablet Take 1 tablet (88 mcg total) by mouth daily. 90 tablet 3   Multiple Vitamin (MULTI VITAMIN) TABS 1 tablet  Orally Once a day for 30 day(s)     Multiple Vitamins-Minerals (MULTIVITAMIN WITH MINERALS) tablet Take 1 tablet by mouth every evening.     Omega-3 Fatty Acids (FISH OIL PO) Take 1 capsule by mouth every evening.     ondansetron (ZOFRAN-ODT) 8 MG disintegrating tablet Take 8 mg by mouth daily.     predniSONE (DELTASONE) 5 MG tablet Take 1.5 tablets (7.5 mg total) by mouth daily with breakfast. Pt may double up during sick days 150 tablet 3   prochlorperazine (COMPAZINE) 10 MG tablet Take 1 tablet (10 mg total) by mouth  every 6 (six) hours as needed for nausea or vomiting. 30 tablet 0   rosuvastatin (CRESTOR) 20 MG tablet Take 1 tablet (20 mg total) by mouth daily. 90 tablet 3   Testosterone (ANDROGEL) 20.25 MG/1.25GM (1.62%) GEL Place 3 Pump onto the skin as directed. 150 g 5   Testosterone 1.62 % GEL SMARTSIG:81 Milligram(s) Topical Every Morning     vitamin E 180 MG (400 UNITS) capsule Take 400 Units by mouth every evening.     No current facility-administered medications for this visit.    SURGICAL HISTORY:  Past Surgical History:  Procedure Laterality Date   CRANIOTOMY N/A 11/08/2021   Procedure: ENDOSCOPIC ENDONASAL RESECTION OF SELLAR MASS;  Surgeon: Jadene Pierini, MD;  Location: [facility] OR;  Service: Neurosurgery;  Laterality: N/A;  RM 20   IR RADIOLOGIST EVAL & MGMT  07/14/2021   IR RADIOLOGIST EVAL & MGMT  07/27/2021   IR RADIOLOGIST EVAL & MGMT  01/30/2022   RADIOFREQUENCY ABLATION N/A 08/23/2021   Procedure: RENAL CRYO ABLATION;  Surgeon: Berdine Dance, MD;  Location: [facility] ORS;  Service: Anesthesiology;  Laterality: N/A;   right shoulder rotator cuff repair Right    ROBOT ASSISTED LAPAROSCOPIC NEPHRECTOMY Left 01/05/2020   Procedure: XI ROBOTIC ASSISTED LAPAROSCOPIC RADICAL NEPHRECTOMY;  Surgeon: Rene Paci, MD;  Location: [facility] ORS;  Service: Urology;  Laterality: Left;   TOTAL HIP ARTHROPLASTY Left 03/31/2020   Procedure: LEFT TOTAL HIP ARTHROPLASTY ANTERIOR APPROACH;  Surgeon: Kathryne Hitch, MD;  Location: [facility] OR;  Service: Orthopedics;  Laterality: Left;   TRANSPHENOIDAL APPROACH EXPOSURE N/A 11/08/2021   Procedure: TRANSPHENOIDAL APPROACH EXPOSURE;  Surgeon: Osborn Coho, MD;  Location: [facility] OR;  Service: ENT;  Laterality: N/A;    REVIEW OF SYSTEMS:   Review of Systems  Constitutional: Negative for appetite change, chills, fatigue, fever and unexpected weight change.  HENT:   Negative for mouth sores, nosebleeds, sore throat and trouble swallowing.   Eyes: Negative for  eye problems and icterus.  Respiratory: Negative for cough, hemoptysis, shortness of breath and wheezing.   Cardiovascular: Negative for chest pain and leg swelling.  Gastrointestinal: Negative for abdominal pain, constipation, diarrhea, nausea and vomiting.  Genitourinary: Negative for bladder incontinence, difficulty urinating, dysuria, frequency and hematuria.   Musculoskeletal: Negative for back pain, gait problem, neck pain and neck stiffness.  Skin: Negative for itching and rash.  Neurological: Negative for dizziness, extremity weakness, gait problem, headaches, light-headedness and seizures.  Hematological: Negative for adenopathy. Does not bruise/bleed easily.  Psychiatric/Behavioral: Negative for confusion, depression and sleep disturbance. The patient is not nervous/anxious.     PHYSICAL EXAMINATION:  There were no vitals taken for this visit.  ECOG PERFORMANCE STATUS: {CHL ONC ECOG [id number]  Physical Exam  Constitutional: Oriented to person, place, and time and well-developed, well-nourished, and in no distress. No distress.  HENT:  Head: Normocephalic and atraumatic.  Mouth/Throat: Oropharynx is clear  and moist. No oropharyngeal exudate.  Eyes: Conjunctivae are normal. Right eye exhibits no discharge. Left eye exhibits no discharge. No scleral icterus.  Neck: Normal range of motion. Neck supple.  Cardiovascular: Normal rate, regular rhythm, normal heart sounds and intact distal pulses.   Pulmonary/Chest: Effort normal and breath sounds normal. No respiratory distress. No wheezes. No rales.  Abdominal: Soft. Bowel sounds are normal. Exhibits no distension and no mass. There is no tenderness.  Musculoskeletal: Normal range of motion. Exhibits no edema.  Lymphadenopathy:    No cervical adenopathy.  Neurological: Alert and oriented to person, place, and time. Exhibits normal muscle tone. Gait normal. Coordination normal.  Skin: Skin is warm and dry. No rash noted. Not  diaphoretic. No erythema. No pallor.  Psychiatric: Mood, memory and judgment normal.  Vitals reviewed.  LABORATORY DATA: Lab Results  Component Value Date   WBC 8.8 01/29/2023   HGB 14.0 01/29/2023   HCT 42.5 01/29/2023   MCV 90.8 01/29/2023   PLT 212 01/29/2023      Chemistry      Component Value Date/Time   NA 142 01/29/2023 0912   NA 140 08/09/2022 1014   K 3.7 01/29/2023 0912   CL 108 01/29/2023 0912   CO2 26 01/29/2023 0912   BUN 17 01/29/2023 0912   BUN 18 08/09/2022 1014   CREATININE 1.16 01/29/2023 0912      Component Value Date/Time   CALCIUM 9.3 01/29/2023 0912   ALKPHOS 57 01/29/2023 0912   AST 29 01/29/2023 0912   ALT 40 01/29/2023 0912   BILITOT 0.4 01/29/2023 0912       RADIOGRAPHIC STUDIES:  MR Brain W Wo Contrast  Result Date: 01/30/2023 CLINICAL DATA:  Brain metastases, assess treatment response 3T SRS Pituitary protocol. Pt has decreased visual fields p Tx. Please evaluate optic chiasm for possible scaring to explain this EXAM: MRI HEAD WITHOUT AND WITH CONTRAST TECHNIQUE: Multiplanar, multiecho pulse sequences of the brain and surrounding structures were obtained without and with intravenous contrast. CONTRAST:  10 ml Vueway COMPARISON:  Brain MR 10/29/22 FINDINGS: Brain: Negative for an acute infarct. No hemorrhage. No hydrocephalus. No extra-axial fluid collection. Background of mild chronic microvascular ischemic change. Redemonstrated asymmetric contrast-enhancing soft tissue in the right aspect of the sella, unchanged from prior exam. There is rightward deviation of the pituitary stalk. There are no new contrast-enhancing lesions. Vascular: Normal flow voids. Skull and upper cervical spine: Normal marrow signal. Sinuses/Orbits: No middle ear or mastoid effusion. Mucosal thickening bilateral maxillary and ethmoid sinuses. Orbits are unremarkable. Other: None. IMPRESSION: 1. Unchanged asymmetric contrast-enhancing soft tissue in the right aspect of the  sella. 2. No new contrast-enhancing lesions. Electronically Signed   By: Lorenza Cambridge M.D.   On: 01/30/2023 15:04   CT CHEST ABDOMEN PELVIS WO CONTRAST  Result Date: 01/28/2023 CLINICAL DATA:  Restaging metastatic renal cell carcinoma * Tracking Code: BO * EXAM: CT CHEST, ABDOMEN AND PELVIS WITHOUT CONTRAST TECHNIQUE: Multidetector CT imaging of the chest, abdomen and pelvis was performed following the standard protocol without IV contrast. RADIATION DOSE REDUCTION: This exam was performed according to the departmental dose-optimization program which includes automated exposure control, adjustment of the mA and/or kV according to patient size and/or use of iterative reconstruction technique. COMPARISON:  11/02/2022 FINDINGS: CT CHEST FINDINGS Cardiovascular: Coronary, aortic arch, and branch vessel atherosclerotic vascular disease. Mediastinum/Nodes: Unremarkable Lungs/Pleura: Stable band of airspace opacity tracking along the major and minor fissure primarily along the upper lobe and middle lobe as on  image 48 series 6, measuring about 3.2 by 1.1 by 2.4 cm. Similar appearance of opacity along the right hemidiaphragm measuring about 9 mm in thickness, similar to previous. Pleural-based nodule medially along the right lower lobe measures 2.7 by 1.5 cm on image 47 series 2, formerly 2.8 by 1.5 cm. This is essentially stable. Musculoskeletal: Thoracic spondylosis. CT ABDOMEN PELVIS FINDINGS Hepatobiliary: Unremarkable noncontrast CT appearance of the hepatic parenchyma. Small layering gallstone in the gallbladder. Pancreas: Unremarkable Spleen: Unremarkable Adrenals/Urinary Tract: Stable thickened appearance the left adrenal gland. Right adrenal gland unremarkable. Left nephrectomy. Ablation site along the right kidney upper pole unchanged. Stomach/Bowel: Sigmoid colon diverticulosis. Vascular/Lymphatic: Atherosclerosis is present, including aortoiliac atherosclerotic disease. No pathologic adenopathy.  Reproductive: Unremarkable Other: No supplemental non-categorized findings. Musculoskeletal: Left total hip prosthesis, potentially some particulate disease in the left upper acetabulum on image 108 series 2. Lumbar spondylosis and degenerative disc disease causing multilevel impingement. IMPRESSION: 1. Stable dominant right pleural nodule. Stable regions of bandlike opacity along the right major fissure and right diaphragm. 2. Stable thickened appearance of the left adrenal gland. 3. Ablation site along the right kidney upper pole unchanged in noncontrast appearance. 4. Left nephrectomy. 5. Cholelithiasis. 6. Sigmoid colon diverticulosis. 7. Lumbar spondylosis and degenerative disc disease causing multilevel impingement. 8. Aortic atherosclerosis. Aortic Atherosclerosis (ICD10-I70.0). Electronically Signed   By: Gaylyn Rong M.D.   On: 01/28/2023 17:22     ASSESSMENT/PLAN:  This is a very pleasant 67 year old male diagnosed with renal cell carcinoma.  He was initially diagnosed and 2021.  He was found to have metastatic disease with pulmonary involvement and pituitary metastasis and 2023   The patient underwent robot-assisted laparoscopic left radical nephrectomy by Dr. Liliane Shi on 01/05/2020 that showed a 10.7 cm lesion with extension into the perinephric tissue 0 of 4 lymph nodes involved.  The final pathology was T3a, N0, grade 2 tumor.   He then underwent radiation therapy to the pulmonary nodules which was completed in March 2023.   He then underwent cryoablation to 2 lesions in the right kidney which was performed on 08/23/2021   He then underwent SRS treatment to the pituitary bed which was completed in October 2023.   The patient is currently undergoing pembrolizumab 200 mg IV every 3 weeks which is started on 08/04/2021. He is status post 21 cycles.  He is also on Axitnib 5 mg daily which was started in February 09, 2022.   Labs were reviewed.  Recommend that he ***with cycle #22 today  scheduled.  We will see him back for follow-up visit in 3 weeks for evaluation repeat blood work before undergoing cycle #23.   He will continue to use imodium for diarrhea.    He can use claritin or zyrtec for nasal congestion if needed. Denies associated symptoms such as fevers or cough.   The patient was advised to call immediately if he has any concerning symptoms in the interval. The patient voices understanding of current disease status and treatment options and is in agreement with the current care plan. All questions were answered. The patient knows to call the clinic with any problems, questions or concerns. We can certainly see the patient much sooner if necessary  No orders of the defined types were placed in this encounter.    I spent {CHL ONC TIME VISIT - WGNFA:[id number]} counseling the patient face to face. The total time spent in the appointment was {CHL ONC TIME VISIT - ONGEX:[id number]}.  Seleny Allbright L Shanna Un, PA-C 02/15/23

## 2023-02-19 ENCOUNTER — Inpatient Hospital Stay (HOSPITAL_BASED_OUTPATIENT_CLINIC_OR_DEPARTMENT_OTHER): Payer: Medicare Other | Admitting: Physician Assistant

## 2023-02-19 ENCOUNTER — Inpatient Hospital Stay: Payer: Medicare Other

## 2023-02-19 VITALS — BP 123/80 | HR 63 | Temp 97.2°F | Resp 13 | Wt 217.9 lb

## 2023-02-19 DIAGNOSIS — Z5112 Encounter for antineoplastic immunotherapy: Secondary | ICD-10-CM | POA: Diagnosis not present

## 2023-02-19 DIAGNOSIS — C649 Malignant neoplasm of unspecified kidney, except renal pelvis: Secondary | ICD-10-CM

## 2023-02-19 DIAGNOSIS — C7801 Secondary malignant neoplasm of right lung: Secondary | ICD-10-CM

## 2023-02-19 DIAGNOSIS — C642 Malignant neoplasm of left kidney, except renal pelvis: Secondary | ICD-10-CM

## 2023-02-19 LAB — CBC WITH DIFFERENTIAL (CANCER CENTER ONLY)
Abs Immature Granulocytes: 0.02 10*3/uL (ref 0.00–0.07)
Basophils Absolute: 0 10*3/uL (ref 0.0–0.1)
Basophils Relative: 1 %
Eosinophils Absolute: 0.2 10*3/uL (ref 0.0–0.5)
Eosinophils Relative: 2 %
HCT: 42.9 % (ref 39.0–52.0)
Hemoglobin: 14 g/dL (ref 13.0–17.0)
Immature Granulocytes: 0 %
Lymphocytes Relative: 30 %
Lymphs Abs: 2.5 10*3/uL (ref 0.7–4.0)
MCH: 29.5 pg (ref 26.0–34.0)
MCHC: 32.6 g/dL (ref 30.0–36.0)
MCV: 90.5 fL (ref 80.0–100.0)
Monocytes Absolute: 0.7 10*3/uL (ref 0.1–1.0)
Monocytes Relative: 8 %
Neutro Abs: 5 10*3/uL (ref 1.7–7.7)
Neutrophils Relative %: 59 %
Platelet Count: 213 10*3/uL (ref 150–400)
RBC: 4.74 MIL/uL (ref 4.22–5.81)
RDW: 15.1 % (ref 11.5–15.5)
WBC Count: 8.4 10*3/uL (ref 4.0–10.5)
nRBC: 0 % (ref 0.0–0.2)

## 2023-02-19 LAB — CMP (CANCER CENTER ONLY)
ALT: 47 U/L — ABNORMAL HIGH (ref 0–44)
AST: 33 U/L (ref 15–41)
Albumin: 4.2 g/dL (ref 3.5–5.0)
Alkaline Phosphatase: 58 U/L (ref 38–126)
Anion gap: 6 (ref 5–15)
BUN: 18 mg/dL (ref 8–23)
CO2: 27 mmol/L (ref 22–32)
Calcium: 9.2 mg/dL (ref 8.9–10.3)
Chloride: 107 mmol/L (ref 98–111)
Creatinine: 1.17 mg/dL (ref 0.61–1.24)
GFR, Estimated: >60 mL/min (ref >60)
Glucose, Bld: 100 mg/dL — ABNORMAL HIGH (ref 70–99)
Potassium: 3.6 mmol/L (ref 3.5–5.1)
Sodium: 140 mmol/L (ref 135–145)
Total Bilirubin: 0.4 mg/dL (ref <1.2)
Total Protein: 6.8 g/dL (ref 6.5–8.1)

## 2023-02-19 LAB — TSH: TSH: <0.01 u[IU]/mL — ABNORMAL LOW (ref 0.350–4.500)

## 2023-02-19 MED ORDER — SODIUM CHLORIDE 0.9 % IV SOLN
200.0000 mg | Freq: Once | INTRAVENOUS | Status: AC
  Administered 2023-02-19: 200 mg via INTRAVENOUS
  Filled 2023-02-19: qty 200

## 2023-02-19 MED ORDER — SODIUM CHLORIDE 0.9 % IV SOLN
Freq: Once | INTRAVENOUS | Status: AC
Start: 2023-02-19 — End: 2023-02-19

## 2023-02-20 LAB — T4: T4, Total: 5.8 ug/dL (ref 4.5–12.0)

## 2023-03-12 ENCOUNTER — Inpatient Hospital Stay: Payer: Medicare Other

## 2023-03-12 ENCOUNTER — Inpatient Hospital Stay (HOSPITAL_BASED_OUTPATIENT_CLINIC_OR_DEPARTMENT_OTHER): Payer: Medicare Other | Admitting: Internal Medicine

## 2023-03-12 ENCOUNTER — Inpatient Hospital Stay: Payer: Medicare Other | Attending: Oncology

## 2023-03-12 VITALS — BP 121/84 | HR 67 | Temp 97.6°F | Resp 17 | Wt 219.6 lb

## 2023-03-12 DIAGNOSIS — R197 Diarrhea, unspecified: Secondary | ICD-10-CM | POA: Diagnosis not present

## 2023-03-12 DIAGNOSIS — C641 Malignant neoplasm of right kidney, except renal pelvis: Secondary | ICD-10-CM

## 2023-03-12 DIAGNOSIS — C7801 Secondary malignant neoplasm of right lung: Secondary | ICD-10-CM

## 2023-03-12 DIAGNOSIS — Z79620 Long term (current) use of immunosuppressive biologic: Secondary | ICD-10-CM | POA: Diagnosis not present

## 2023-03-12 DIAGNOSIS — C642 Malignant neoplasm of left kidney, except renal pelvis: Secondary | ICD-10-CM

## 2023-03-12 DIAGNOSIS — C649 Malignant neoplasm of unspecified kidney, except renal pelvis: Secondary | ICD-10-CM | POA: Diagnosis not present

## 2023-03-12 DIAGNOSIS — Z5112 Encounter for antineoplastic immunotherapy: Secondary | ICD-10-CM | POA: Insufficient documentation

## 2023-03-12 LAB — CBC WITH DIFFERENTIAL (CANCER CENTER ONLY)
Abs Immature Granulocytes: 0.04 10*3/uL (ref 0.00–0.07)
Basophils Absolute: 0.1 10*3/uL (ref 0.0–0.1)
Basophils Relative: 1 %
Eosinophils Absolute: 0.1 10*3/uL (ref 0.0–0.5)
Eosinophils Relative: 1 %
HCT: 41.7 % (ref 39.0–52.0)
Hemoglobin: 13.8 g/dL (ref 13.0–17.0)
Immature Granulocytes: 0 %
Lymphocytes Relative: 15 %
Lymphs Abs: 1.7 10*3/uL (ref 0.7–4.0)
MCH: 30 pg (ref 26.0–34.0)
MCHC: 33.1 g/dL (ref 30.0–36.0)
MCV: 90.7 fL (ref 80.0–100.0)
Monocytes Absolute: 0.8 10*3/uL (ref 0.1–1.0)
Monocytes Relative: 7 %
Neutro Abs: 8.4 10*3/uL — ABNORMAL HIGH (ref 1.7–7.7)
Neutrophils Relative %: 76 %
Platelet Count: 228 10*3/uL (ref 150–400)
RBC: 4.6 MIL/uL (ref 4.22–5.81)
RDW: 15.1 % (ref 11.5–15.5)
WBC Count: 11.1 10*3/uL — ABNORMAL HIGH (ref 4.0–10.5)
nRBC: 0 % (ref 0.0–0.2)

## 2023-03-12 LAB — CMP (CANCER CENTER ONLY)
ALT: 44 U/L (ref 0–44)
AST: 32 U/L (ref 15–41)
Albumin: 4.2 g/dL (ref 3.5–5.0)
Alkaline Phosphatase: 57 U/L (ref 38–126)
Anion gap: 7 (ref 5–15)
BUN: 21 mg/dL (ref 8–23)
CO2: 27 mmol/L (ref 22–32)
Calcium: 8.9 mg/dL (ref 8.9–10.3)
Chloride: 107 mmol/L (ref 98–111)
Creatinine: 1.22 mg/dL (ref 0.61–1.24)
GFR, Estimated: >60 mL/min (ref >60)
Glucose, Bld: 98 mg/dL (ref 70–99)
Potassium: 4 mmol/L (ref 3.5–5.1)
Sodium: 141 mmol/L (ref 135–145)
Total Bilirubin: 0.4 mg/dL (ref <1.2)
Total Protein: 6.9 g/dL (ref 6.5–8.1)

## 2023-03-12 LAB — TSH: TSH: <0.01 u[IU]/mL — ABNORMAL LOW (ref 0.350–4.500)

## 2023-03-12 MED ORDER — SODIUM CHLORIDE 0.9 % IV SOLN: Freq: Once | INTRAVENOUS | Status: AC

## 2023-03-12 MED ORDER — PEMBROLIZUMAB CHEMO INJECTION 100 MG/4ML
200.0000 mg | Freq: Once | INTRAVENOUS | Status: AC
  Administered 2023-03-12: 200 mg via INTRAVENOUS
  Filled 2023-03-12: qty 200

## 2023-03-12 NOTE — Progress Notes (Signed)
[facility] [phone]   [phone]  OFFICE PROGRESS NOTE  [name], [name], [place] 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    PRIOR THERAPY: 1) status post robotic assisted laparoscopic left radical nephrectomy by Dr. Liliane Shi on January 05, 2020.  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  The final pathological staging was T3aN0 grade 2 tumor.     2) Status post radiation therapy to the pulmonary nodules completed March 2023.  He received 50 Gray in 5 fractions.   3) Status post cryoablation to 2 lesions in the right kidney completed on Aug 23, 2021.   4) status post endoscopic transsphenoidal pituitary resection completed by Dr. Annalee Genta on November 08, 2021.  The final pathology showed clear-cell renal cell carcinoma.   5) SRS treatment to pituitary bed completed in October 2023.  He received 5 fractions for a total of 25 Gray.  CURRENT THERAPY: Keytruda 200 Mg IV every 3 weeks started Aug 04, 2021 status post 22 cycles with axitinib 5 mg p.o. daily started February 09, 2022.  INTERVAL HISTORY: THOMOS STASIAK 67 y.o. male returns to the clinic today for follow-up visit.Discussed the use of AI scribe software for clinical note transcription with the patient, who gave verbal consent to proceed.  History of Present Illness   Tayveon Ohr, a 67 year old patient with a history of stage four clear cell renal cell carcinoma, underwent a left nephrectomy in October 2021 and was found to have metastasis in 2023. The patient has been on treatment with [facility] and axitinib since May 2023.  The patient reports persistent diarrhea, occurring primarily in the evenings, with up to two episodes per day. This  has been managed with three doses of Imodium daily. No other gastrointestinal symptoms were reported.  The patient also notes some hair loss, which he attributes to hormonal changes related to high testosterone levels. The patient reports that the hair loss seems to be improving after a reduction in testosterone levels. No other side effects from the treatment, such as rash, chest pain, or breathing issues, were reported.         MEDICAL HISTORY: Past Medical History:  Diagnosis Date   Arthritis    Cancer (HCC)    Kidney, Lung, Pituitary   Hyperlipidemia    Hypothyroidism     ALLERGIES:  has No Known Allergies.  MEDICATIONS:  Current Outpatient Medications  Medication Sig Dispense Refill   Ascorbic Acid (VITAMIN C PO) Take 1 tablet by mouth every evening.     aspirin EC 81 MG tablet Take 81 mg by mouth every evening. Swallow whole.     chlorhexidine (PERIDEX) 0.12 % solution SMARTSIG:By Mouth     cholecalciferol (VITAMIN D3) 25 MCG (1000 UNIT) tablet Take 1,000 Units by mouth every evening.     Ferrous Sulfate (IRON SLOW RELEASE PO) Take 1 tablet by mouth every evening.     HYDROcodone-acetaminophen (NORCO) 10-325 MG tablet Take by mouth.     ibuprofen (ADVIL) 200 MG tablet Take 400-600 mg by mouth every 6 (six) hours as needed for moderate pain.     INLYTA 5 MG tablet TAKE 1 TABLET BY MOUTH DAILY 30 tablet 3   levothyroxine (SYNTHROID) 88 MCG tablet Take 1  tablet (88 mcg total) by mouth daily. 90 tablet 3   Multiple Vitamin (MULTI VITAMIN) TABS 1 tablet Orally Once a day for 30 day(s)     Multiple Vitamins-Minerals (MULTIVITAMIN WITH MINERALS) tablet Take 1 tablet by mouth every evening.     Omega-3 Fatty Acids (FISH OIL PO) Take 1 capsule by mouth every evening.     ondansetron (ZOFRAN-ODT) 8 MG disintegrating tablet Take 8 mg by mouth daily.     predniSONE (DELTASONE) 5 MG tablet Take 1.5 tablets (7.5 mg total) by mouth daily with breakfast. Pt may double up during sick days  150 tablet 3   prochlorperazine (COMPAZINE) 10 MG tablet Take 1 tablet (10 mg total) by mouth every 6 (six) hours as needed for nausea or vomiting. 30 tablet 0   rosuvastatin (CRESTOR) 20 MG tablet Take 1 tablet (20 mg total) by mouth daily. 90 tablet 3   Testosterone (ANDROGEL) 20.25 MG/1.25GM (1.62%) GEL Place 3 Pump onto the skin as directed. 150 g 5   Testosterone 1.62 % GEL SMARTSIG:81 Milligram(s) Topical Every Morning     vitamin E 180 MG (400 UNITS) capsule Take 400 Units by mouth every evening.     No current facility-administered medications for this visit.    SURGICAL HISTORY:  Past Surgical History:  Procedure Laterality Date   CRANIOTOMY N/A 11/08/2021   Procedure: ENDOSCOPIC ENDONASAL RESECTION OF SELLAR MASS;  Surgeon: Jadene Pierini, MD;  Location: [facility] OR;  Service: Neurosurgery;  Laterality: N/A;  RM 20   IR RADIOLOGIST EVAL & MGMT  07/14/2021   IR RADIOLOGIST EVAL & MGMT  07/27/2021   IR RADIOLOGIST EVAL & MGMT  01/30/2022   RADIOFREQUENCY ABLATION N/A 08/23/2021   Procedure: RENAL CRYO ABLATION;  Surgeon: Berdine Dance, MD;  Location: [facility] ORS;  Service: Anesthesiology;  Laterality: N/A;   right shoulder rotator cuff repair Right    ROBOT ASSISTED LAPAROSCOPIC NEPHRECTOMY Left 01/05/2020   Procedure: XI ROBOTIC ASSISTED LAPAROSCOPIC RADICAL NEPHRECTOMY;  Surgeon: Rene Paci, MD;  Location: [facility]S;  Service: Urology;  Laterality: Left;   TOTAL HIP ARTHROPLASTY Left 03/31/2020   Procedure: LEFT TOTAL HIP ARTHROPLASTY ANTERIOR APPROACH;  Surgeon: Kathryne Hitch, MD;  Location: [facility] OR;  Service: Orthopedics;  Laterality: Left;   TRANSPHENOIDAL APPROACH EXPOSURE N/A 11/08/2021   Procedure: TRANSPHENOIDAL APPROACH EXPOSURE;  Surgeon: Osborn Coho, MD;  Location: [facility] OR;  Service: ENT;  Laterality: N/A;    REVIEW OF SYSTEMS:  A comprehensive review of systems was negative except for: Gastrointestinal: positive for diarrhea   PHYSICAL EXAMINATION:  General appearance: alert, cooperative, and no distress Head: Normocephalic, without obvious abnormality, atraumatic Neck: no adenopathy, no JVD, supple, symmetrical, trachea midline, and thyroid not enlarged, symmetric, no tenderness/mass/nodules Lymph nodes: Cervical, supraclavicular, and axillary nodes normal. Resp: clear to auscultation bilaterally Back: symmetric, no curvature. ROM normal. No CVA tenderness. Cardio: regular rate and rhythm, S1, S2 normal, no murmur, click, rub or gallop GI: soft, non-tender; bowel sounds normal; no masses,  no organomegaly Extremities: extremities normal, atraumatic, no cyanosis or edema  ECOG PERFORMANCE STATUS: 1 - Symptomatic but completely ambulatory  Blood pressure 121/84, pulse 67, temperature 97.6 F (36.4 C), temperature source Temporal, resp. rate 17, weight 219 lb 9.6 oz (99.6 kg), SpO2 98%.  LABORATORY DATA: Lab Results  Component Value Date   WBC 11.1 (H) 03/12/2023   HGB 13.8 03/12/2023   HCT 41.7 03/12/2023   MCV 90.7 03/12/2023   PLT 228 03/12/2023  Chemistry      Component Value Date/Time   NA 140 02/19/2023 0845   NA 140 08/09/2022 1014   K 3.6 02/19/2023 0845   CL 107 02/19/2023 0845   CO2 27 02/19/2023 0845   BUN 18 02/19/2023 0845   BUN 18 08/09/2022 1014   CREATININE 1.17 02/19/2023 0845      Component Value Date/Time   CALCIUM 9.2 02/19/2023 0845   ALKPHOS 58 02/19/2023 0845   AST 33 02/19/2023 0845   ALT 47 (H) 02/19/2023 0845   BILITOT 0.4 02/19/2023 0845       RADIOGRAPHIC STUDIES: No results found.  ASSESSMENT AND PLAN: This is a very pleasant 67 years old white male diagnosed with stage IV clear-cell renal cell carcinoma with pulmonary involvement as well as pituitary metastasis in 2023.  The patient was initially diagnosed as stage III and October 2021 status post robotic assisted laparoscopic left radical nephrectomy followed by radiation therapy to the pulmonary nodules in March 2023 followed by  cryoablation to 2 lesions in the right kidney completed in May 2023.  He is also status post endoscopic transsphenoidal pituitary resection on November 08, 2021.  He is also status post SRS to the pituitary bed in October 2023. The patient is currently undergoing treatment with Keytruda 200 Mg IV every 3 weeks status post 22 cycles.  This was started on Aug 04, 2021.  He is also on axitinib 5 mg p.o. daily since November 2023.  The patient has been tolerating his treatment well with no concerning adverse effects except for the few episodes of diarrhea managed with Imodium.    Stage IV Clear Cell Renal Cell Carcinoma Diagnosed in October 2021 with left nephrectomy. Metastasis noted in 2023. Currently on Keytruda and axitinib since May 2023. Reports well-managed diarrhea with Imodium. No new symptoms such as rash, chest pain, or dyspnea. Hair loss noted, likely hormone-related, improved after testosterone adjustment. Decision to delay scan for one more cycle and perform it in six weeks. - Continue Keytruda and axitinib - Continue Imodium 8 AM, 3 PM, and 8 PM - Order scan in six weeks - Follow-up in three weeks.  The patient was advised to call immediately if he has any concerning symptoms in the interval. The patient voices understanding of current disease status and treatment options and is in agreement with the current care plan.  All questions were answered. The patient knows to call the clinic with any problems, questions or concerns. We can certainly see the patient much sooner if necessary.  The total time spent in the appointment was 20 minutes.  Disclaimer: This note was dictated with voice recognition software. Similar sounding words can inadvertently be transcribed and may not be corrected upon review.

## 2023-03-13 LAB — T4: T4, Total: 7.1 ug/dL (ref 4.5–12.0)

## 2023-03-25 NOTE — Progress Notes (Signed)
 [facility] OFFICE PROGRESS NOTE  Marc [name], Marc [name], [name] 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 Devere on January 05, 2020.  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  The final pathological staging was T3aN0 grade 2 tumor.     2) Status post radiation therapy to the pulmonary nodules completed March 2023.  He received 50 Gray in 5 fractions.   3) Status post cryoablation to 2 lesions in the right kidney completed on Aug 23, 2021.   4) status post endoscopic transsphenoidal pituitary resection completed by Dr. Mable on November 08, 2021.  The final pathology showed clear-cell renal cell carcinoma.   5) SRS treatment to pituitary bed completed in October 2023.  He received 5 fractions for a total of 25 Gray.  CURRENT THERAPY: Keytruda  200 Mg IV every 3 weeks started Aug 04, 2021 status post 23 cycles with axitinib  5 mg p.o. daily started February 09, 2022.   INTERVAL HISTORY: Marc [name] 67 y.o. male returns for a follow-up visit.  He is feeling fairly well today without any concerning complaints. He is currently on treatment with  Keytruda  every 3 weeks and Axitinib  p.o. daily.  The patient is tolerating treatment well without any concerning adverse side effects except for diarrhea but he states he has this under control with imodium and denies any new concerns with regard to this. The patient denies any recent fever, chills, night sweats, unexplained weight loss. He has been trying to be more active with walking every day. He denies any appetite changes. He denies any rashes or skin changes at this time. Denies any chest pain,  shortness of breath, cough, or hemoptysis.  Denies any nausea, vomiting, or constipation. Denies any dysuria, malodorous urine, urinary frequency or urgency.  Denies any headache or visual changes.  He is followed by Dr. Buckley and IR. He is here for evaluation and repeat blood work before undergoing cycle #24.   MEDICAL HISTORY: Past Medical History:  Diagnosis Date   Arthritis    Cancer (HCC)    Kidney, Lung, Pituitary   Hyperlipidemia    Hypothyroidism     ALLERGIES:  has no known allergies.  MEDICATIONS:  Current Outpatient Medications  Medication Sig Dispense Refill   Ascorbic Acid  (VITAMIN C PO) Take 1 tablet by mouth every evening.     aspirin  EC 81 MG tablet Take 81 mg by mouth every evening. Swallow whole.     chlorhexidine  (PERIDEX ) 0.12 % solution SMARTSIG:By Mouth     cholecalciferol  (VITAMIN D3) 25 MCG (1000 UNIT) tablet Take 1,000 Units by mouth every evening.     Ferrous Sulfate  (IRON SLOW RELEASE PO) Take 1 tablet by mouth every evening.     HYDROcodone -acetaminophen  (NORCO) 10-325 MG tablet Take by mouth.     ibuprofen  (ADVIL ) 200 MG tablet Take 400-600 mg by mouth every 6 (six) hours as needed for moderate pain.     INLYTA  5 MG tablet TAKE 1 TABLET BY MOUTH DAILY 30 tablet 3   levothyroxine  (SYNTHROID ) 88 MCG tablet Take 1 tablet (88 mcg total) by mouth daily. 90 tablet 3   Multiple Vitamin (MULTI VITAMIN) TABS 1 tablet Orally Once a  day for 30 day(s)     Multiple Vitamins-Minerals (MULTIVITAMIN WITH MINERALS) tablet Take 1 tablet by mouth every evening.     Omega-3 Fatty Acids (FISH OIL PO) Take 1 capsule by mouth every evening.     ondansetron  (ZOFRAN -ODT) 8 MG disintegrating tablet Take 8 mg by mouth daily.     predniSONE  (DELTASONE ) 5 MG tablet Take 1.5 tablets (7.5 mg total) by mouth daily with breakfast. Pt may double up during sick days 150 tablet 3   prochlorperazine  (COMPAZINE ) 10 MG tablet Take 1 tablet (10 mg total) by mouth every 6 (six) hours as needed  for nausea or vomiting. 30 tablet 0   rosuvastatin  (CRESTOR ) 20 MG tablet Take 1 tablet (20 mg total) by mouth daily. 90 tablet 3   Testosterone  (ANDROGEL ) 20.25 MG/1.25GM (1.62%) GEL Place 3 Pump onto the skin as directed. 150 g 5   Testosterone  1.62 % GEL SMARTSIG:81 Milligram(s) Topical Every Morning     vitamin E  180 MG (400 UNITS) capsule Take 400 Units by mouth every evening.     No current facility-administered medications for this visit.    SURGICAL HISTORY:  Past Surgical History:  Procedure Laterality Date   CRANIOTOMY N/A 11/08/2021   Procedure: ENDOSCOPIC ENDONASAL RESECTION OF SELLAR MASS;  Surgeon: Cheryle Debby LABOR, MD;  Location: [facility] OR;  Service: Neurosurgery;  Laterality: N/A;  RM 20   IR RADIOLOGIST EVAL & MGMT  07/14/2021   IR RADIOLOGIST EVAL & MGMT  07/27/2021   IR RADIOLOGIST EVAL & MGMT  01/30/2022   RADIOFREQUENCY ABLATION N/A 08/23/2021   Procedure: RENAL CRYO ABLATION;  Surgeon: Vanice Sharper, MD;  Location: [facility];  Service: Anesthesiology;  Laterality: N/A;   right shoulder rotator cuff repair Right    ROBOT ASSISTED LAPAROSCOPIC NEPHRECTOMY Left 01/05/2020   Procedure: XI ROBOTIC ASSISTED LAPAROSCOPIC RADICAL NEPHRECTOMY;  Surgeon: Devere Lonni Righter, MD;  Location: [facility];  Service: Urology;  Laterality: Left;   TOTAL HIP ARTHROPLASTY Left 03/31/2020   Procedure: LEFT TOTAL HIP ARTHROPLASTY ANTERIOR APPROACH;  Surgeon: Vernetta Lonni GRADE, MD;  Location: [facility] OR;  Service: Orthopedics;  Laterality: Left;   TRANSPHENOIDAL APPROACH EXPOSURE N/A 11/08/2021   Procedure: TRANSPHENOIDAL APPROACH EXPOSURE;  Surgeon: Mable Lenis, MD;  Location: [facility] OR;  Service: ENT;  Laterality: N/A;    REVIEW OF SYSTEMS:   Review of Systems  Constitutional: Negative for appetite change, chills, fatigue, fever and unexpected weight change.  HENT:   Negative for mouth sores, nosebleeds, sore throat and trouble swallowing.   Eyes: Negative for eye problems and icterus.   Respiratory: Negative for cough, hemoptysis, shortness of breath and wheezing.   Cardiovascular: Negative for chest pain and leg swelling.  Gastrointestinal: Positive for controlled diarrhea. Negative for abdominal pain, constipation, nausea and vomiting.  Genitourinary: Negative for bladder incontinence, difficulty urinating, dysuria, frequency and hematuria.   Musculoskeletal: Negative for back pain, gait problem, neck pain and neck stiffness.  Skin: Negative for itching and rash.  Neurological: Negative for dizziness, extremity weakness, gait problem, headaches, light-headedness and seizures.  Hematological: Negative for adenopathy. Does not bruise/bleed easily.  Psychiatric/Behavioral: Negative for confusion, depression and sleep disturbance. The patient is not nervous/anxious.     PHYSICAL EXAMINATION:  Blood pressure 128/84, pulse 67, temperature (!) 97.5 F (36.4 C), temperature source Oral, resp. rate 16, height 5' 9 (1.753 m), weight 221 lb 1.6 oz (100.3 kg), SpO2 98%.  ECOG PERFORMANCE STATUS: 1  Physical Exam  Constitutional: Oriented to person, place, and time and  well-developed, well-nourished, and in no distress.  HENT:  Head: Normocephalic and atraumatic.  Mouth/Throat: Oropharynx is clear and moist. No oropharyngeal exudate.  Eyes: Conjunctivae are normal. Right eye exhibits no discharge. Left eye exhibits no discharge. No scleral icterus.  Neck: Normal range of motion. Neck supple.  Cardiovascular: Normal rate, regular rhythm, normal heart sounds and intact distal pulses.   Pulmonary/Chest: Effort normal and breath sounds normal. No respiratory distress. No wheezes. No rales.  Abdominal: Soft. Bowel sounds are normal. Exhibits no distension and no mass. There is no tenderness.  Musculoskeletal: Normal range of motion. Exhibits no edema.  Lymphadenopathy:    No cervical adenopathy.  Neurological: Alert and oriented to person, place, and time. Exhibits normal muscle  tone. Gait normal. Coordination normal.  Skin: Skin is warm and dry. No rash noted. Not diaphoretic. No erythema. No pallor.  Psychiatric: Mood, memory and judgment normal.  Vitals reviewed.  LABORATORY DATA: Lab Results  Component Value Date   WBC 8.9 04/02/2023   HGB 14.4 04/02/2023   HCT 43.5 04/02/2023   MCV 89.5 04/02/2023   PLT 214 04/02/2023      Chemistry      Component Value Date/Time   NA 141 04/02/2023 0757   NA 140 08/09/2022 1014   K 3.9 04/02/2023 0757   CL 106 04/02/2023 0757   CO2 28 04/02/2023 0757   BUN 20 04/02/2023 0757   BUN 18 08/09/2022 1014   CREATININE 1.22 04/02/2023 0757      Component Value Date/Time   CALCIUM  9.0 04/02/2023 0757   ALKPHOS 58 04/02/2023 0757   AST 31 04/02/2023 0757   ALT 50 (H) 04/02/2023 0757   BILITOT 0.4 04/02/2023 0757       RADIOGRAPHIC STUDIES:  No results found.   ASSESSMENT/PLAN:  This is a very pleasant 67 year old male diagnosed with renal cell carcinoma.  He was initially diagnosed and 2021.  He was found to have metastatic disease with pulmonary involvement and pituitary metastasis and 2023   The patient underwent robot-assisted laparoscopic left radical nephrectomy by Dr. Devere on 01/05/2020 that showed a 10.7 cm lesion with extension into the perinephric tissue 0 of 4 lymph nodes involved.  The final pathology was T3a, N0, grade 2 tumor.   He then underwent radiation therapy to the pulmonary nodules which was completed in March 2023.   He then underwent cryoablation to 2 lesions in the right kidney which was performed on 08/23/2021   He then underwent SRS treatment to the pituitary bed which was completed in October 2023.   The patient is currently undergoing pembrolizumab  200 mg IV every 3 weeks which is started on 08/04/2021. He is status post 23 cycles.  He is also on Axitnib 5 mg daily which was started in February 09, 2022.    Labs were reviewed.  Recommend that he proceed with cycle #24 today as  scheduled.   We will see him back for follow-up visit in 3 weeks for evaluation repeat blood work before undergoing cycle #25.    He will continue to use imodium for diarrhea.   I will arrange for a restaging CT scan of the CAP prior to his next cycle of treatment. I will order without contrast since he only has one kidney  The patient was advised to call immediately if she has any concerning symptoms in the interval. The patient voices understanding of current disease status and treatment options and is in agreement with the current care plan. All questions  were answered. The patient knows to call the clinic with any problems, questions or concerns. We can certainly see the patient much sooner if necessary   Orders Placed This Encounter  Procedures   CT CHEST ABDOMEN PELVIS WO CONTRAST    Standing Status:   Future    Expected Date:   04/16/2023    Expiration Date:   04/01/2024    Preferred imaging location?:   [facility]    If indicated for the ordered procedure, I authorize the administration of oral contrast media per Radiology protocol:   Yes    Does the patient have a contrast media/X-ray dye allergy?:   No   CBC with Differential (Cancer Center Only)    Standing Status:   Future    Expected Date:   05/14/2023    Expiration Date:   05/13/2024   CMP (Cancer Center only)    Standing Status:   Future    Expected Date:   05/14/2023    Expiration Date:   05/13/2024   T4    Standing Status:   Future    Expected Date:   05/14/2023    Expiration Date:   05/13/2024   TSH    Standing Status:   Future    Expected Date:   05/14/2023    Expiration Date:   05/13/2024   CBC with Differential (Cancer Center Only)    Standing Status:   Future    Expected Date:   04/23/2023    Expiration Date:   04/22/2024   CMP (Cancer Center only)    Standing Status:   Future    Expected Date:   04/23/2023    Expiration Date:   04/22/2024   T4    Standing Status:   Future    Expected Date:    04/23/2023    Expiration Date:   04/22/2024   TSH    Standing Status:   Future    Expected Date:   04/23/2023    Expiration Date:   04/22/2024   CBC with Differential (Cancer Center Only)    Standing Status:   Future    Expected Date:   07/16/2023    Expiration Date:   07/15/2024   CMP (Cancer Center only)    Standing Status:   Future    Expected Date:   07/16/2023    Expiration Date:   07/15/2024   T4    Standing Status:   Future    Expected Date:   07/16/2023    Expiration Date:   07/15/2024   TSH    Standing Status:   Future    Expected Date:   07/16/2023    Expiration Date:   07/15/2024   CBC with Differential (Cancer Center Only)    Standing Status:   Future    Expected Date:   06/25/2023    Expiration Date:   06/24/2024   CMP (Cancer Center only)    Standing Status:   Future    Expected Date:   06/25/2023    Expiration Date:   06/24/2024   T4    Standing Status:   Future    Expected Date:   06/25/2023    Expiration Date:   06/24/2024   TSH    Standing Status:   Future    Expected Date:   06/25/2023    Expiration Date:   06/24/2024   CBC with Differential (Cancer Center Only)    Standing Status:   Future    Expected Date:  06/04/2023    Expiration Date:   06/03/2024   CMP (Cancer Center only)    Standing Status:   Future    Expected Date:   06/04/2023    Expiration Date:   06/03/2024   T4    Standing Status:   Future    Expected Date:   06/04/2023    Expiration Date:   06/03/2024   TSH    Standing Status:   Future    Expected Date:   06/04/2023    Expiration Date:   06/03/2024    The total time spent in the appointment was 20-29 minutes  Eulia Hatcher L Tiari Andringa, PA-C 04/02/23

## 2023-04-02 ENCOUNTER — Inpatient Hospital Stay (HOSPITAL_BASED_OUTPATIENT_CLINIC_OR_DEPARTMENT_OTHER): Payer: Medicare Other | Admitting: Physician Assistant

## 2023-04-02 ENCOUNTER — Inpatient Hospital Stay: Payer: Medicare Other

## 2023-04-02 VITALS — BP 117/81 | HR 61 | Temp 97.8°F | Resp 19

## 2023-04-02 VITALS — BP 128/84 | HR 67 | Temp 97.5°F | Resp 16 | Ht 69.0 in | Wt 221.1 lb

## 2023-04-02 DIAGNOSIS — C642 Malignant neoplasm of left kidney, except renal pelvis: Secondary | ICD-10-CM

## 2023-04-02 DIAGNOSIS — Z5112 Encounter for antineoplastic immunotherapy: Secondary | ICD-10-CM

## 2023-04-02 DIAGNOSIS — C7801 Secondary malignant neoplasm of right lung: Secondary | ICD-10-CM | POA: Diagnosis not present

## 2023-04-02 DIAGNOSIS — C649 Malignant neoplasm of unspecified kidney, except renal pelvis: Secondary | ICD-10-CM

## 2023-04-02 LAB — CBC WITH DIFFERENTIAL (CANCER CENTER ONLY)
Abs Immature Granulocytes: 0.03 10*3/uL (ref 0.00–0.07)
Basophils Absolute: 0.1 10*3/uL (ref 0.0–0.1)
Basophils Relative: 1 %
Eosinophils Absolute: 0.2 10*3/uL (ref 0.0–0.5)
Eosinophils Relative: 2 %
HCT: 43.5 % (ref 39.0–52.0)
Hemoglobin: 14.4 g/dL (ref 13.0–17.0)
Immature Granulocytes: 0 %
Lymphocytes Relative: 29 %
Lymphs Abs: 2.5 10*3/uL (ref 0.7–4.0)
MCH: 29.6 pg (ref 26.0–34.0)
MCHC: 33.1 g/dL (ref 30.0–36.0)
MCV: 89.5 fL (ref 80.0–100.0)
Monocytes Absolute: 0.7 10*3/uL (ref 0.1–1.0)
Monocytes Relative: 8 %
Neutro Abs: 5.4 10*3/uL (ref 1.7–7.7)
Neutrophils Relative %: 60 %
Platelet Count: 214 10*3/uL (ref 150–400)
RBC: 4.86 MIL/uL (ref 4.22–5.81)
RDW: 15.1 % (ref 11.5–15.5)
WBC Count: 8.9 10*3/uL (ref 4.0–10.5)
nRBC: 0 % (ref 0.0–0.2)

## 2023-04-02 LAB — TSH: TSH: <1 u[IU]/mL (ref 0.350–4.500)

## 2023-04-02 LAB — CMP (CANCER CENTER ONLY)
ALT: 50 U/L — ABNORMAL HIGH (ref 0–44)
AST: 31 U/L (ref 15–41)
Albumin: 4.3 g/dL (ref 3.5–5.0)
Alkaline Phosphatase: 58 U/L (ref 38–126)
Anion gap: 7 (ref 5–15)
BUN: 20 mg/dL (ref 8–23)
CO2: 28 mmol/L (ref 22–32)
Calcium: 9 mg/dL (ref 8.9–10.3)
Chloride: 106 mmol/L (ref 98–111)
Creatinine: 1.22 mg/dL (ref 0.61–1.24)
GFR, Estimated: >60 mL/min (ref >60)
Glucose, Bld: 90 mg/dL (ref 70–99)
Potassium: 3.9 mmol/L (ref 3.5–5.1)
Sodium: 141 mmol/L (ref 135–145)
Total Bilirubin: 0.4 mg/dL (ref 0.0–1.2)
Total Protein: 7 g/dL (ref 6.5–8.1)

## 2023-04-02 MED ORDER — SODIUM CHLORIDE 0.9 % IV SOLN: Freq: Once | INTRAVENOUS | Status: AC

## 2023-04-02 MED ORDER — SODIUM CHLORIDE 0.9 % IV SOLN
200.0000 mg | Freq: Once | INTRAVENOUS | Status: AC
  Administered 2023-04-02: 200 mg via INTRAVENOUS
  Filled 2023-04-02: qty 200

## 2023-04-02 NOTE — Progress Notes (Signed)
 Patient presents today for chemotherapy infusion of Keytruda . Patient is in satisfactory condition with no new complaints voiced.  Vital signs are stable.  Labs reviewed by Dr. Sherrod during the office visit and all labs are within treatment parameters.  We will proceed with treatment per MD orders.   Patient tolerated treatment well with no complaints voiced.  Patient left ambulatory in stable condition.  Vital signs stable at discharge.  Follow up as scheduled.

## 2023-04-02 NOTE — Patient Instructions (Signed)

## 2023-04-03 LAB — T4: T4, Total: 6.5 ug/dL (ref 4.5–12.0)

## 2023-04-16 ENCOUNTER — Ambulatory Visit (HOSPITAL_COMMUNITY)
Admission: RE | Admit: 2023-04-16 | Discharge: 2023-04-16 | Disposition: A | Discharge status: Home / Self Care | Payer: Medicare Other | Source: Ambulatory Visit | Attending: Physician Assistant | Admitting: Physician Assistant

## 2023-04-16 ENCOUNTER — Other Ambulatory Visit: Payer: Self-pay | Admitting: Internal Medicine

## 2023-04-16 DIAGNOSIS — C642 Malignant neoplasm of left kidney, except renal pelvis: Secondary | ICD-10-CM | POA: Diagnosis present

## 2023-04-18 NOTE — Progress Notes (Signed)
[name] OFFICE PROGRESS NOTE  [name], [place], [name] 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 Liliane Shi on January 05, 2020.  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  The final pathological staging was T3aN0 grade 2 tumor.     2) Status post radiation therapy to the pulmonary nodules completed March 2023.  He received 50 Gray in 5 fractions.   3) Status post cryoablation to 2 lesions in the right kidney completed on Aug 23, 2021.   4) status post endoscopic transsphenoidal pituitary resection completed by Dr. Annalee Genta on November 08, 2021.  The final pathology showed clear-cell renal cell carcinoma.   5) SRS treatment to pituitary bed completed in October 2023.  He received 5 fractions for a total of 25 Gray.  CURRENT THERAPY: Keytruda 200 Mg IV every 3 weeks started Aug 04, 2021 status post 24 cycles with axitinib 5 mg p.o. daily started February 09, 2022.   INTERVAL HISTORY: Marc [name] 68 y.o. male returns to the clinic today for a follow-up visit.  He is feeling fairly well today without any concerning complaints. He is currently on treatment with  Keytruda every 3 weeks and Axitinib p.o. daily.  The patient is tolerating treatment well without any concerning adverse side effects except for diarrhea but he states he has this under control with imodium and denies any new concerns with regard to this. The patient denies any recent fever, chills, night sweats, unexplained weight loss. He has been trying to be more active with walking every day. He denies any appetite changes. He denies any rashes or skin changes at this time except  he may get a small dry patch of skin here and there (had one on his hand last night) he uses udder cream. Denies any chest pain, shortness of breath, cough, or hemoptysis.  Denies any nausea, vomiting, or constipation. Denies any dysuria, malodorous urine, urinary frequency or urgency.  Denies any headache or visual changes.  He is followed by Dr. Barbaraann Cao and IR. He recently had a restaging CT scan performed. He is here for evaluation and repeat blood work before undergoing cycle #25.    MEDICAL HISTORY: Past Medical History:  Diagnosis Date   Arthritis    Cancer (HCC)    Kidney, Lung, Pituitary   Hyperlipidemia    Hypothyroidism     ALLERGIES:  has no known allergies.  MEDICATIONS:  Current Outpatient Medications  Medication Sig Dispense Refill   Ascorbic Acid (VITAMIN C PO) Take 1 tablet by mouth every evening.     aspirin EC 81 MG tablet Take 81 mg by mouth every evening. Swallow whole.     chlorhexidine (PERIDEX) 0.12 % solution SMARTSIG:By Mouth     cholecalciferol (VITAMIN D3) 25 MCG (1000 UNIT) tablet Take 1,000 Units by mouth every evening.     Ferrous Sulfate (IRON SLOW RELEASE PO) Take 1 tablet by mouth every evening.     HYDROcodone-acetaminophen (NORCO) 10-325 MG tablet Take by mouth.     ibuprofen (ADVIL) 200 MG tablet Take 400-600 mg by mouth every 6 (six) hours as needed for moderate pain.     INLYTA 5 MG tablet TAKE 1 TABLET  BY MOUTH DAILY 30 tablet 3   levothyroxine (SYNTHROID) 88 MCG tablet Take 1 tablet (88 mcg total) by mouth daily. 90 tablet 3   Multiple Vitamin (MULTI VITAMIN) TABS 1 tablet Orally Once a day for 30 day(s)     Multiple Vitamins-Minerals (MULTIVITAMIN WITH MINERALS) tablet Take 1 tablet by mouth every evening.     Omega-3 Fatty Acids (FISH OIL PO) Take 1 capsule by mouth every evening.     ondansetron (ZOFRAN-ODT) 8 MG disintegrating tablet Take 8 mg by mouth daily.     predniSONE (DELTASONE) 5 MG tablet Take 1.5 tablets (7.5 mg total) by mouth daily  with breakfast. Pt may double up during sick days 150 tablet 3   prochlorperazine (COMPAZINE) 10 MG tablet Take 1 tablet (10 mg total) by mouth every 6 (six) hours as needed for nausea or vomiting. 30 tablet 0   rosuvastatin (CRESTOR) 20 MG tablet Take 1 tablet (20 mg total) by mouth daily. 90 tablet 3   Testosterone (ANDROGEL) 20.25 MG/1.25GM (1.62%) GEL Place 3 Pump onto the skin as directed. 150 g 5   Testosterone 1.62 % GEL SMARTSIG:81 Milligram(s) Topical Every Morning     vitamin E 180 MG (400 UNITS) capsule Take 400 Units by mouth every evening.     No current facility-administered medications for this visit.    SURGICAL HISTORY:  Past Surgical History:  Procedure Laterality Date   CRANIOTOMY N/A 11/08/2021   Procedure: ENDOSCOPIC ENDONASAL RESECTION OF SELLAR MASS;  Surgeon: Jadene Pierini, MD;  Location: [facility] OR;  Service: Neurosurgery;  Laterality: N/A;  RM 20   IR RADIOLOGIST EVAL & MGMT  07/14/2021   IR RADIOLOGIST EVAL & MGMT  07/27/2021   IR RADIOLOGIST EVAL & MGMT  01/30/2022   RADIOFREQUENCY ABLATION N/A 08/23/2021   Procedure: RENAL CRYO ABLATION;  Surgeon: Berdine Dance, MD;  Location: [facility] ORS;  Service: Anesthesiology;  Laterality: N/A;   right shoulder rotator cuff repair Right    ROBOT ASSISTED LAPAROSCOPIC NEPHRECTOMY Left 01/05/2020   Procedure: XI ROBOTIC ASSISTED LAPAROSCOPIC RADICAL NEPHRECTOMY;  Surgeon: Rene Paci, MD;  Location: [facility] ORS;  Service: Urology;  Laterality: Left;   TOTAL HIP ARTHROPLASTY Left 03/31/2020   Procedure: LEFT TOTAL HIP ARTHROPLASTY ANTERIOR APPROACH;  Surgeon: Kathryne Hitch, MD;  Location: [facility] OR;  Service: Orthopedics;  Laterality: Left;   TRANSPHENOIDAL APPROACH EXPOSURE N/A 11/08/2021   Procedure: TRANSPHENOIDAL APPROACH EXPOSURE;  Surgeon: Osborn Coho, MD;  Location: [facility] OR;  Service: ENT;  Laterality: N/A;    REVIEW OF SYSTEMS:   Review of Systems  Constitutional: Negative for appetite change, chills,  fatigue, fever and unexpected weight change.  HENT:   Negative for mouth sores, nosebleeds, sore throat and trouble swallowing.   Eyes: Negative for eye problems and icterus.  Respiratory: Negative for cough, hemoptysis, shortness of breath and wheezing.   Cardiovascular: Negative for chest pain and leg swelling.  Gastrointestinal: Positive for controlled diarrhea. Negative for abdominal pain, constipation, nausea and vomiting.  Genitourinary: Negative for bladder incontinence, difficulty urinating, dysuria, frequency and hematuria.   Musculoskeletal: Negative for back pain, gait problem, neck pain and neck stiffness.  Skin: Single small rash on right hand. Neurological: Negative for dizziness, extremity weakness, gait problem, headaches, light-headedness and seizures.  Hematological: Negative for adenopathy. Does not bruise/bleed easily.  Psychiatric/Behavioral: Negative for confusion, depression and sleep disturbance. The patient is not nervous/anxious.     PHYSICAL EXAMINATION:  Blood pressure 127/85, pulse 61, temperature (!) 97.2 F (36.2 C),  temperature source Tympanic, resp. rate 18, weight 224 lb (101.6 kg), SpO2 98%.  ECOG PERFORMANCE STATUS: 1  Physical Exam  Constitutional: Oriented to person, place, and time and well-developed, well-nourished, and in no distress.  HENT:  Head: Normocephalic and atraumatic.  Mouth/Throat: Oropharynx is clear and moist. No oropharyngeal exudate.  Eyes: Conjunctivae are normal. Right eye exhibits no discharge. Left eye exhibits no discharge. No scleral icterus.  Neck: Normal range of motion. Neck supple.  Cardiovascular: Normal rate, regular rhythm, normal heart sounds and intact distal pulses.   Pulmonary/Chest: Effort normal and breath sounds normal. No respiratory distress. No wheezes. No rales.  Abdominal: Soft. Bowel sounds are normal. Exhibits no distension and no mass. There is no tenderness.  Musculoskeletal: Normal range of motion.  Exhibits no edema.  Lymphadenopathy:    No cervical adenopathy.  Neurological: Alert and oriented to person, place, and time. Exhibits normal muscle tone. Gait normal. Coordination normal.  Skin: Skin is warm and dry. Not diaphoretic. No erythema. No pallor.  Psychiatric: Mood, memory and judgment normal.  Vitals reviewed.  LABORATORY DATA: Lab Results  Component Value Date   WBC 10.9 (H) 04/23/2023   HGB 13.8 04/23/2023   HCT 42.4 04/23/2023   MCV 91.8 04/23/2023   PLT 215 04/23/2023      Chemistry      Component Value Date/Time   NA 140 04/23/2023 1108   NA 140 08/09/2022 1014   K 4.1 04/23/2023 1108   CL 105 04/23/2023 1108   CO2 30 04/23/2023 1108   BUN 21 04/23/2023 1108   BUN 18 08/09/2022 1014   CREATININE 1.23 04/23/2023 1108      Component Value Date/Time   CALCIUM 9.0 04/23/2023 1108   ALKPHOS 54 04/23/2023 1108   AST 28 04/23/2023 1108   ALT 38 04/23/2023 1108   BILITOT 0.3 04/23/2023 1108       RADIOGRAPHIC STUDIES:  CT CHEST ABDOMEN PELVIS WO CONTRAST Result Date: 04/16/2023 CLINICAL DATA:  History of metastatic renal cell carcinoma, restaging. * Tracking Code: BO * EXAM: CT CHEST, ABDOMEN AND PELVIS WITHOUT CONTRAST TECHNIQUE: Multidetector CT imaging of the chest, abdomen and pelvis was performed following the standard protocol without IV contrast. RADIATION DOSE REDUCTION: This exam was performed according to the departmental dose-optimization program which includes automated exposure control, adjustment of the mA and/or kV according to patient size and/or use of iterative reconstruction technique. COMPARISON:  Multiple priors including CT January 21, 2023 FINDINGS: CT CHEST FINDINGS Cardiovascular: Aortic atherosclerosis. Normal caliber central pulmonary arteries. Normal size heart. Three-vessel coronary artery calcifications. Mediastinum/Nodes: No suspicious thyroid nodule. No pathologically enlarged mediastinal, hilar or axillary lymph nodes. Patulous  esophagus. Lungs/Pleura: The band of airspace opacity in the right lung tracking along the major and minor fissure are similar to prior for instance on images 69-89/4. Right lower lobe subpleural pulmonary nodularity measures 2.7 x 1.6 cm on image 107/4 previously 2.7 x 1.5 cm. No new suspicious pulmonary nodules or masses. Musculoskeletal: No aggressive lytic or blastic lesion of bone CT ABDOMEN PELVIS FINDINGS Hepatobiliary: No suspicious hepatic lesion on noncontrast enhanced examination. Cholelithiasis without findings of acute cholecystitis. No biliary ductal dilation. Pancreas: No pancreatic ductal dilation or evidence of acute inflammation. Spleen: No splenomegaly. Adrenals/Urinary Tract: Stable thickened appearance of the left adrenal gland without discrete nodularity. Right adrenal gland appears normal. Prior left nephrectomy without new suspicious soft tissue in the surgical bed. Ablation site along the right upper pole kidney is similar prior. Urinary bladder is unremarkable  for degree of distension. Stomach/Bowel: No radiopaque enteric contrast material was administered. No evidence of bowel obstruction. Left-sided colonic diverticulosis without findings of acute diverticulitis. Vascular/Lymphatic: Aortic atherosclerosis. Smooth IVC contours. No pathologically enlarged abdominal or pelvic lymph nodes. Reproductive: No acute finding or suspicious mass Other: No significant abdominopelvic free fluid. Musculoskeletal: No aggressive lytic or blastic lesion of bone. Multilevel degenerative change of the spine. Left total hip arthroplasty. IMPRESSION: 1. Stable examination without evidence of new or progressive disease in the chest, abdomen or pelvis. 2. Stable noncontrast enhanced appearance of the ablation site along the right upper pole kidney. 3. Prior left nephrectomy without new suspicious soft tissue in the surgical bed. 4. Stable thickening of the left adrenal gland without discrete nodularity. 5.  Cholelithiasis without findings of acute cholecystitis. 6. Left-sided colonic diverticulosis without findings of acute diverticulitis. 7.  Aortic Atherosclerosis (ICD10-I70.0). Electronically Signed   By: Maudry Mayhew M.D.   On: 04/16/2023 11:56     ASSESSMENT/PLAN:  This is a very pleasant 68 year old male diagnosed with renal cell carcinoma.  He was initially diagnosed and 2021.  He was found to have metastatic disease with pulmonary involvement and pituitary metastasis and 2023   The patient underwent robot-assisted laparoscopic left radical nephrectomy by Dr. Liliane Shi on 01/05/2020 that showed a 10.7 cm lesion with extension into the perinephric tissue 0 of 4 lymph nodes involved.  The final pathology was T3a, N0, grade 2 tumor.   He then underwent radiation therapy to the pulmonary nodules which was completed in March 2023.   He then underwent cryoablation to 2 lesions in the right kidney which was performed on 08/23/2021   He then underwent SRS treatment to the pituitary bed which was completed in October 2023.   The patient is currently undergoing pembrolizumab 200 mg IV every 3 weeks which is started on 08/04/2021. He is status post 24 cycles.  He is also on Axitnib 5 mg daily which was started in February 09, 2022.   The patient was seen with Dr. Arbutus Ped today.  Dr. Arbutus Ped personally and independently reviewed the scan and discussed results with the patient today.  The scan showed no evidence of disease progression.  Dr. Arbutus Ped recommends he continue on the same treatment at the same dose.   At his last appointment, the patient was asking if the intent of treatment was curative. Dr. Arbutus Ped clarified today that the intent of treatment is palliative. His disease is stable at this time.     Labs were reviewed.  Recommend that he proceed with cycle #25 today as scheduled.   We will see him back for follow-up visit in 3 weeks for evaluation repeat blood work before undergoing cycle #26.     He will continue to use imodium for diarrhea.    The patient was advised to call immediately if she has any concerning symptoms in the interval. The patient voices understanding of current disease status and treatment options and is in agreement with the current care plan. All questions were answered. The patient knows to call the clinic with any problems, questions or concerns. We can certainly see the patient much sooner if necessary    No orders of the defined types were placed in this encounter.    Leara Rawl L Alinda Egolf, PA-C 04/23/23  ADDENDUM: Hematology/Oncology Attending: I had a face-to-face encounter with the patient today.  I reviewed his record, lab, scan and recommended his care plan.  This is a very pleasant 68 years old white male  with a stage IV clear-cell renal cell carcinoma with pulmonary metastasis as well as pituitary metastasis diagnosed in 2023.  The patient is status post left radical nephrectomy as well as radiotherapy to the pulmonary nodules and cryoablation of 2 lesions in the right kidney completed in May 2023.  He also status post endoscopic transsphenoidal pituitary resection on November 08, 2021 and the pathology was consistent with clear-cell renal cell carcinoma.  He also had treatment with SRS to the pituitary bed in October 2023.  The patient has been on treatment with Keytruda 200 Mg IV every 3 weeks status post 24 cycles in addition to axitinib 5 mg p.o. daily since November 2023.  He has been tolerating this treatment well with no concerning adverse effects. He had repeat CT scan of the chest, abdomen and pelvis performed recently.  I personally and independently reviewed the scan and discussed the result with the patient today.  His scan showed no concerning findings for disease progression. I recommended for him to continue his current treatment with Keytruda and axitinib. I explained to the patient that despite the good response of the treatment he had  in the last few years with surgical resection as well as radiation treatment, he is currently undergoing palliative treatment to his condition with Keytruda and axitinib.  He confirmed understanding of his current condition.  He will proceed with cycle #25 of his treatment with Rande Lawman today He will come back for follow-up visit in 3 weeks for evaluation before the next cycle of his treatment. He was advised to call immediately if he has any other concerning symptoms in the interval. The total time spent in the appointment was 30 minutes. Disclaimer: This note was dictated with voice recognition software. Similar sounding words can inadvertently be transcribed and may be missed upon review. Lajuana Matte, MD

## 2023-04-21 ENCOUNTER — Other Ambulatory Visit: Payer: Self-pay

## 2023-04-23 ENCOUNTER — Other Ambulatory Visit: Payer: Self-pay

## 2023-04-23 ENCOUNTER — Inpatient Hospital Stay (HOSPITAL_BASED_OUTPATIENT_CLINIC_OR_DEPARTMENT_OTHER): Payer: Medicare Other | Admitting: Physician Assistant

## 2023-04-23 ENCOUNTER — Inpatient Hospital Stay: Payer: Medicare Other | Attending: Oncology

## 2023-04-23 ENCOUNTER — Inpatient Hospital Stay: Payer: Medicare Other

## 2023-04-23 VITALS — BP 127/85 | HR 61 | Temp 97.2°F | Resp 18 | Wt 224.0 lb

## 2023-04-23 DIAGNOSIS — C641 Malignant neoplasm of right kidney, except renal pelvis: Secondary | ICD-10-CM | POA: Diagnosis not present

## 2023-04-23 DIAGNOSIS — C7800 Secondary malignant neoplasm of unspecified lung: Secondary | ICD-10-CM | POA: Insufficient documentation

## 2023-04-23 DIAGNOSIS — C642 Malignant neoplasm of left kidney, except renal pelvis: Secondary | ICD-10-CM | POA: Diagnosis present

## 2023-04-23 DIAGNOSIS — C649 Malignant neoplasm of unspecified kidney, except renal pelvis: Secondary | ICD-10-CM

## 2023-04-23 DIAGNOSIS — Z5112 Encounter for antineoplastic immunotherapy: Secondary | ICD-10-CM | POA: Diagnosis not present

## 2023-04-23 DIAGNOSIS — Z79620 Long term (current) use of immunosuppressive biologic: Secondary | ICD-10-CM | POA: Diagnosis not present

## 2023-04-23 DIAGNOSIS — C7801 Secondary malignant neoplasm of right lung: Secondary | ICD-10-CM | POA: Diagnosis not present

## 2023-04-23 LAB — CBC WITH DIFFERENTIAL (CANCER CENTER ONLY)
Abs Immature Granulocytes: 0.02 10*3/uL (ref 0.00–0.07)
Basophils Absolute: 0.1 10*3/uL (ref 0.0–0.1)
Basophils Relative: 1 %
Eosinophils Absolute: 0.1 10*3/uL (ref 0.0–0.5)
Eosinophils Relative: 1 %
HCT: 42.4 % (ref 39.0–52.0)
Hemoglobin: 13.8 g/dL (ref 13.0–17.0)
Immature Granulocytes: 0 %
Lymphocytes Relative: 21 %
Lymphs Abs: 2.3 10*3/uL (ref 0.7–4.0)
MCH: 29.9 pg (ref 26.0–34.0)
MCHC: 32.5 g/dL (ref 30.0–36.0)
MCV: 91.8 fL (ref 80.0–100.0)
Monocytes Absolute: 0.9 10*3/uL (ref 0.1–1.0)
Monocytes Relative: 8 %
Neutro Abs: 7.5 10*3/uL (ref 1.7–7.7)
Neutrophils Relative %: 69 %
Platelet Count: 215 10*3/uL (ref 150–400)
RBC: 4.62 MIL/uL (ref 4.22–5.81)
RDW: 15.3 % (ref 11.5–15.5)
WBC Count: 10.9 10*3/uL — ABNORMAL HIGH (ref 4.0–10.5)
nRBC: 0 % (ref 0.0–0.2)

## 2023-04-23 LAB — TSH: TSH: <0.01 u[IU]/mL — ABNORMAL LOW (ref 0.350–4.500)

## 2023-04-23 LAB — CMP (CANCER CENTER ONLY)
ALT: 38 U/L (ref 0–44)
AST: 28 U/L (ref 15–41)
Albumin: 4.2 g/dL (ref 3.5–5.0)
Alkaline Phosphatase: 54 U/L (ref 38–126)
Anion gap: 5 (ref 5–15)
BUN: 21 mg/dL (ref 8–23)
CO2: 30 mmol/L (ref 22–32)
Calcium: 9 mg/dL (ref 8.9–10.3)
Chloride: 105 mmol/L (ref 98–111)
Creatinine: 1.23 mg/dL (ref 0.61–1.24)
GFR, Estimated: >60 mL/min (ref >60)
Glucose, Bld: 93 mg/dL (ref 70–99)
Potassium: 4.1 mmol/L (ref 3.5–5.1)
Sodium: 140 mmol/L (ref 135–145)
Total Bilirubin: 0.3 mg/dL (ref 0.0–1.2)
Total Protein: 6.8 g/dL (ref 6.5–8.1)

## 2023-04-23 MED ORDER — SODIUM CHLORIDE 0.9 % IV SOLN
200.0000 mg | Freq: Once | INTRAVENOUS | Status: AC
  Administered 2023-04-23: 200 mg via INTRAVENOUS
  Filled 2023-04-23: qty 200

## 2023-04-23 MED ORDER — SODIUM CHLORIDE 0.9 % IV SOLN: Freq: Once | INTRAVENOUS | Status: AC

## 2023-04-23 NOTE — Patient Instructions (Signed)

## 2023-04-24 LAB — T4: T4, Total: 6.1 ug/dL (ref 4.5–12.0)

## 2023-04-28 ENCOUNTER — Other Ambulatory Visit: Payer: Self-pay | Admitting: Cardiovascular Disease

## 2023-04-28 DIAGNOSIS — I2510 Atherosclerotic heart disease of native coronary artery without angina pectoris: Secondary | ICD-10-CM

## 2023-04-28 DIAGNOSIS — E782 Mixed hyperlipidemia: Secondary | ICD-10-CM

## 2023-04-29 ENCOUNTER — Other Ambulatory Visit: Payer: Self-pay

## 2023-05-01 ENCOUNTER — Other Ambulatory Visit: Payer: Self-pay | Admitting: Interventional Radiology

## 2023-05-01 DIAGNOSIS — N2889 Other specified disorders of kidney and ureter: Secondary | ICD-10-CM

## 2023-05-06 ENCOUNTER — Encounter: Payer: Self-pay | Admitting: Internal Medicine

## 2023-05-08 ENCOUNTER — Ambulatory Visit
Admission: RE | Admit: 2023-05-08 | Discharge: 2023-05-08 | Disposition: A | Discharge status: Home / Self Care | Payer: Medicare Other | Source: Ambulatory Visit | Attending: Interventional Radiology | Admitting: Interventional Radiology

## 2023-05-08 DIAGNOSIS — N2889 Other specified disorders of kidney and ureter: Secondary | ICD-10-CM

## 2023-05-08 HISTORY — PX: IR RADIOLOGIST EVAL & MGMT: IMG5224

## 2023-05-08 NOTE — Progress Notes (Signed)
 Patient ID: CLEVE [name], male   DOB: [birth year], 68 y.o.   MRN: [id number]       Chief Complaint:  Metastatic renal cell carcinoma  Referring Physician(s): Dr. Gatha  History of Present Illness: Marc [name] is a 68 y.o. male Being treated for stage IV metastatic renal carcinoma.  He is status post left nephrectomy and adrenalectomy for a large left renal cell carcinoma back in October 2021.  Surveillance imaging confirm metastatic disease with 2 enlarging pulmonary nodules status post SBRT.  He also underwent surgery and radiation for a pituitary metastatic lesion.  Surveillance imaging confirmed to suspicious enlarging right renal cortical masses.  These were successfully treated with image guided cryoablation on 07/24/2021.  He continues to undergo chemo and immunotherapy with Dr. Gatha.  Overall he is doing very well physically.  No flank or abdominal pain.  No recent illness or fevers.  Stable weight.  Recent surveillance CT performed 04/16/2023 without contrast. No suspicious abnormality within the limits of noncontrast imaging.  Stable ablation changes in the right kidney upper pole.  No signs of progressive disease.  Past Medical History:  Diagnosis Date   Arthritis    Cancer (HCC)    Kidney, Lung, Pituitary   Hyperlipidemia    Hypothyroidism     Past Surgical History:  Procedure Laterality Date   CRANIOTOMY N/A 11/08/2021   Procedure: ENDOSCOPIC ENDONASAL RESECTION OF SELLAR MASS;  Surgeon: Cheryle Debby LABOR, MD;  Location: [facility] OR;  Service: Neurosurgery;  Laterality: N/A;  RM 20   IR RADIOLOGIST EVAL & MGMT  07/14/2021   IR RADIOLOGIST EVAL & MGMT  07/27/2021   IR RADIOLOGIST EVAL & MGMT  01/30/2022   RADIOFREQUENCY ABLATION N/A 08/23/2021   Procedure: RENAL CRYO ABLATION;  Surgeon: Vanice Sharper, MD;  Location: [facility];  Service: Anesthesiology;  Laterality: N/A;   right shoulder rotator cuff repair Right    ROBOT ASSISTED LAPAROSCOPIC NEPHRECTOMY Left  01/05/2020   Procedure: XI ROBOTIC ASSISTED LAPAROSCOPIC RADICAL NEPHRECTOMY;  Surgeon: Devere Lonni Righter, MD;  Location: [facility];  Service: Urology;  Laterality: Left;   TOTAL HIP ARTHROPLASTY Left 03/31/2020   Procedure: LEFT TOTAL HIP ARTHROPLASTY ANTERIOR APPROACH;  Surgeon: Vernetta Lonni GRADE, MD;  Location: [facility] OR;  Service: Orthopedics;  Laterality: Left;   TRANSPHENOIDAL APPROACH EXPOSURE N/A 11/08/2021   Procedure: TRANSPHENOIDAL APPROACH EXPOSURE;  Surgeon: Mable Lenis, MD;  Location: [facility];  Service: ENT;  Laterality: N/A;    Allergies: Patient has no known allergies.  Medications: Prior to Admission medications   Medication Sig Start Date End Date Taking? Authorizing Provider  Ascorbic Acid  (VITAMIN C PO) Take 1 tablet by mouth every evening.    [provider]  aspirin  EC 81 MG tablet Take 81 mg by mouth every evening. Swallow whole.    [provider]  chlorhexidine  (PERIDEX ) 0.12 % solution SMARTSIG:By Mouth 11/20/22   [provider]  cholecalciferol  (VITAMIN D3) 25 MCG (1000 UNIT) tablet Take 1,000 Units by mouth every evening.    [provider]  Ferrous Sulfate  (IRON SLOW RELEASE PO) Take 1 tablet by mouth every evening.    [provider]  HYDROcodone -acetaminophen  (NORCO) 10-325 MG tablet Take by mouth. 11/20/22   [provider]  ibuprofen  (ADVIL ) 200 MG tablet Take 400-600 mg by mouth every 6 (six) hours as needed for moderate pain.    [provider]  INLYTA  5 MG tablet TAKE 1 TABLET BY MOUTH DAILY 01/09/23   Heilingoetter, Cassandra L, PA-C  levothyroxine  (SYNTHROID ) 88 MCG tablet Take 1 tablet (88 mcg total) by mouth daily. 06/22/22   Shamleffer, Ibtehal Jaralla, MD  Multiple Vitamin (MULTI VITAMIN) TABS 1 tablet Orally Once a day for 30 day(s)    [provider]  Multiple Vitamins-Minerals (MULTIVITAMIN WITH MINERALS) tablet Take 1 tablet by mouth every evening.    [provider]  Omega-3 Fatty Acids (FISH OIL PO) Take 1 capsule by mouth every evening.    [provider]  ondansetron  (ZOFRAN -ODT) 8 MG disintegrating tablet Take 8 mg by mouth daily. 11/20/22   [provider]  predniSONE  (DELTASONE ) 5 MG tablet Take 1.5 tablets (7.5 mg total) by mouth daily with breakfast. Pt may double up during sick days 06/22/22   Shamleffer, Ibtehal Jaralla, MD  prochlorperazine  (COMPAZINE ) 10 MG tablet Take 1 tablet (10 mg total) by mouth every 6 (six) hours as needed for nausea or vomiting. 08/31/21   Shadad, Firas N, MD  rosuvastatin  (CRESTOR ) 20 MG tablet TAKE 1 TABLET(20 MG) BY MOUTH DAILY 04/30/23   Nahser, Aleene PARAS, MD  Testosterone  (ANDROGEL ) 20.25 MG/1.25GM (1.62%) GEL Place 3 Pump onto the skin as directed. 02/01/23   Shamleffer, Ibtehal Jaralla, MD  Testosterone  1.62 % GEL SMARTSIG:81 Milligram(s) Topical Every Morning 12/21/22   [provider]  vitamin E  180 MG (400 UNITS) capsule Take 400 Units by mouth every evening.    [provider]     No family history on file.  Social History   Socioeconomic History   Marital status: Single    Spouse name: Not on file   Number of children: 2   Years of education: Not on file   Highest education level: Not on file  Occupational History   Not on file  Tobacco Use   Smoking status: Former    Current packs/day: 0.00    Types: Cigarettes    Start date: 04/02/1985    Quit date: 04/03/2015    Years since quitting: 8.1   Smokeless tobacco: Never  Vaping Use   Vaping status: Former   Quit date: 04/16/2021   Substances: Nicotine, Flavoring  Substance and Sexual Activity   Alcohol use: Yes    Comment: occas    Drug use: No   Sexual activity: Not on file  Other Topics Concern   Not on file  Social History Narrative   Not on file   Social Drivers of Health   Financial Resource Strain: Not on file  Food Insecurity: No Food Insecurity (11/01/2022)   Hunger Vital Sign    Worried About Running  Out of Food in the Last Year: Never true    Ran Out of Food in the Last Year: Never true  Transportation Needs: No Transportation Needs (11/01/2022)   PRAPARE - Administrator, Civil Service (Medical): No    Lack of Transportation (Non-Medical): No  Physical Activity: Not on file  Stress: Not on file  Social Connections: Not on file    ECOG Status: 1 - Symptomatic but completely ambulatory  Review of Systems  Review of Systems: A 12 point ROS discussed and pertinent positives are indicated in the HPI above.  All other systems are negative.     Physical Exam No direct physical exam was performed  Telephone visit today to review interval surveillance imaging  Vital Signs: There were no vitals taken for this visit.  Imaging: CT CHEST ABDOMEN PELVIS WO CONTRAST Result Date: 04/16/2023 CLINICAL DATA:  History of metastatic renal cell carcinoma,  restaging. * Tracking Code: BO * EXAM: CT CHEST, ABDOMEN AND PELVIS WITHOUT CONTRAST TECHNIQUE: Multidetector CT imaging of the chest, abdomen and pelvis was performed following the standard protocol without IV contrast. RADIATION DOSE REDUCTION: This exam was performed according to the departmental dose-optimization program which includes automated exposure control, adjustment of the mA and/or kV according to patient size and/or use of iterative reconstruction technique. COMPARISON:  Multiple priors including CT January 21, 2023 FINDINGS: CT CHEST FINDINGS Cardiovascular: Aortic atherosclerosis. Normal caliber central pulmonary arteries. Normal size heart. Three-vessel coronary artery calcifications. Mediastinum/Nodes: No suspicious thyroid  nodule. No pathologically enlarged mediastinal, hilar or axillary lymph nodes. Patulous esophagus. Lungs/Pleura: The band of airspace opacity in the right lung tracking along the major and minor fissure are similar to prior for instance on images 69-89/4. Right lower lobe subpleural pulmonary nodularity  measures 2.7 x 1.6 cm on image 107/4 previously 2.7 x 1.5 cm. No new suspicious pulmonary nodules or masses. Musculoskeletal: No aggressive lytic or blastic lesion of bone CT ABDOMEN PELVIS FINDINGS Hepatobiliary: No suspicious hepatic lesion on noncontrast enhanced examination. Cholelithiasis without findings of acute cholecystitis. No biliary ductal dilation. Pancreas: No pancreatic ductal dilation or evidence of acute inflammation. Spleen: No splenomegaly. Adrenals/Urinary Tract: Stable thickened appearance of the left adrenal gland without discrete nodularity. Right adrenal gland appears normal. Prior left nephrectomy without new suspicious soft tissue in the surgical bed. Ablation site along the right upper pole kidney is similar prior. Urinary bladder is unremarkable for degree of distension. Stomach/Bowel: No radiopaque enteric contrast material was administered. No evidence of bowel obstruction. Left-sided colonic diverticulosis without findings of acute diverticulitis. Vascular/Lymphatic: Aortic atherosclerosis. Smooth IVC contours. No pathologically enlarged abdominal or pelvic lymph nodes. Reproductive: No acute finding or suspicious mass Other: No significant abdominopelvic free fluid. Musculoskeletal: No aggressive lytic or blastic lesion of bone. Multilevel degenerative change of the spine. Left total hip arthroplasty. IMPRESSION: 1. Stable examination without evidence of new or progressive disease in the chest, abdomen or pelvis. 2. Stable noncontrast enhanced appearance of the ablation site along the right upper pole kidney. 3. Prior left nephrectomy without new suspicious soft tissue in the surgical bed. 4. Stable thickening of the left adrenal gland without discrete nodularity. 5. Cholelithiasis without findings of acute cholecystitis. 6. Left-sided colonic diverticulosis without findings of acute diverticulitis. 7.  Aortic Atherosclerosis (ICD10-I70.0). Electronically Signed   By: Reyes Holder  M.D.   On: 04/16/2023 11:56    Labs:  CBC: Recent Labs    02/19/23 0845 03/12/23 1008 04/02/23 0757 04/23/23 1108  WBC 8.4 11.1* 8.9 10.9*  HGB 14.0 13.8 14.4 13.8  HCT 42.9 41.7 43.5 42.4  PLT 213 228 214 215    COAGS: No results for input(s): INR, APTT in the last 8760 hours.  BMP: Recent Labs    02/19/23 0845 03/12/23 1008 04/02/23 0757 04/23/23 1108  NA 140 141 141 140  K 3.6 4.0 3.9 4.1  CL 107 107 106 105  CO2 27 27 28 30   GLUCOSE 100* 98 90 93  BUN 18 21 20 21   CALCIUM  9.2 8.9 9.0 9.0  CREATININE 1.17 1.22 1.22 1.23  GFRNONAA >60 >60 >60 >60    LIVER FUNCTION TESTS: Recent Labs    02/19/23 0845 03/12/23 1008 04/02/23 0757 04/23/23 1108  BILITOT 0.4 0.4 0.4 0.3  AST 33 32 31 28  ALT 47* 44 50* 38  ALKPHOS 58 57 58 54  PROT 6.8 6.9 7.0 6.8  ALBUMIN  4.2 4.2 4.3 4.2  Assessment and Plan:  Nearly 2 years status post CT-guided cryoablation of 2 right renal upper pole neoplasm suspicious for additional renal cell carcinomas versus metastatic disease given his history of stage IV renal cell carcinoma.  Continue interval surveillance imaging without contrast confirms a stable ablation defect in the right kidney upper pole.  No suspicious finding in noncontrast imaging.  No interval progression of disease by CT.  Clinically he continues to do very well on chemo and immunotherapy.  Plan: Repeat surveillance CT imaging in 3 to 4 months.  Imaging will be scheduled by Dr. Jeannett office.      Electronically Signed: Ozell Specking 05/08/2023, 9:50 AM   I spent a total of    25 Minutes in remote  clinical consultation, greater than 50% of which was counseling/coordinating care for This patient status post right renal cryoablations.    Visit type: Audio only (telephone). Audio (no video) only due to patient's lack of internet/smartphone capability. Alternative for in-person consultation at [facility], [address] [place], [place], [place].     This format is felt to be most appropriate for this patient at this time.  All issues noted in this document were discussed and addressed.

## 2023-05-08 NOTE — Progress Notes (Signed)
[facility] OFFICE PROGRESS NOTE  [name], [place], [name] 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 Liliane Shi on January 05, 2020.  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  The final pathological staging was T3aN0 grade 2 tumor.     2) Status post radiation therapy to the pulmonary nodules completed March 2023.  He received 50 Gray in 5 fractions.   3) Status post cryoablation to 2 lesions in the right kidney completed on Aug 23, 2021.   4) status post endoscopic transsphenoidal pituitary resection completed by Dr. Annalee Genta on November 08, 2021.  The final pathology showed clear-cell renal cell carcinoma.   5) SRS treatment to pituitary bed completed in October 2023.  He received 5 fractions for a total of 25 Gray.  CURRENT THERAPY: Keytruda 200 Mg IV every 3 weeks started Aug 04, 2021 status post 25 cycles with axitinib 5 mg p.o. daily started February 09, 2022.   INTERVAL HISTORY: BORA BRONER 68 y.o. male returns to the clinic today for a follow-up visit. He is feeling fairly well today without any concerning complaints. He found out his father passed away this morning though. He is currently on treatment with  Keytruda every 3 weeks and Axitinib p.o. daily. He saw IR recently on 05/08/23 The patient is tolerating treatment well without any concerning adverse side effects except for diarrhea but he states he has this under control with imodium and denies any new concerns with regard to this. He started taking imodium 4 tablets per day with better control of his diarrhea.   The patient denies any recent fever, chills, night sweats,  unexplained weight loss. He has been trying to be more active with walking every day. He has already walked about 8,000 steps today. He denies any appetite changes. He denies any rashes or skin changes . The small rashes/drys skin on his hands has cleared up since last being seen. Denies any chest pain, shortness of breath, cough, or hemoptysis.  Denies any nausea, vomiting, or constipation. Denies any dysuria, malodorous urine, urinary frequency or urgency.  Denies any headache or visual changes.  He is followed by Dr. Barbaraann Cao and IR. He is here for evaluation and repeat blood work before undergoing cycle #26.    MEDICAL HISTORY: Past Medical History:  Diagnosis Date   Arthritis    Cancer (HCC)    Kidney, Lung, Pituitary   Hyperlipidemia    Hypothyroidism     ALLERGIES:  has no known allergies.  MEDICATIONS:  Current Outpatient Medications  Medication Sig Dispense Refill   Ascorbic Acid (VITAMIN C PO) Take 1 tablet by mouth every evening.     aspirin EC 81 MG tablet Take 81 mg by mouth every evening. Swallow whole.     chlorhexidine (PERIDEX) 0.12 % solution SMARTSIG:By Mouth     cholecalciferol (VITAMIN D3) 25 MCG (1000 UNIT) tablet Take 1,000 Units by mouth every evening.     Ferrous Sulfate (IRON SLOW RELEASE PO) Take 1 tablet by mouth every evening.     HYDROcodone-acetaminophen (NORCO) 10-325 MG tablet Take by mouth.     ibuprofen (ADVIL) 200 MG tablet Take 400-600 mg by mouth every 6 (  six) hours as needed for moderate pain.     INLYTA 5 MG tablet TAKE 1 TABLET BY MOUTH DAILY 30 tablet 3   levothyroxine (SYNTHROID) 88 MCG tablet Take 1 tablet (88 mcg total) by mouth daily. 90 tablet 3   Multiple Vitamin (MULTI VITAMIN) TABS 1 tablet Orally Once a day for 30 day(s)     Multiple Vitamins-Minerals (MULTIVITAMIN WITH MINERALS) tablet Take 1 tablet by mouth every evening.     Omega-3 Fatty Acids (FISH OIL PO) Take 1 capsule by mouth every evening.     ondansetron (ZOFRAN-ODT) 8 MG  disintegrating tablet Take 8 mg by mouth daily.     predniSONE (DELTASONE) 5 MG tablet Take 1.5 tablets (7.5 mg total) by mouth daily with breakfast. Pt may double up during sick days 150 tablet 3   prochlorperazine (COMPAZINE) 10 MG tablet Take 1 tablet (10 mg total) by mouth every 6 (six) hours as needed for nausea or vomiting. 30 tablet 0   rosuvastatin (CRESTOR) 20 MG tablet TAKE 1 TABLET(20 MG) BY MOUTH DAILY 90 tablet 1   Testosterone (ANDROGEL) 20.25 MG/1.25GM (1.62%) GEL Place 3 Pump onto the skin as directed. 150 g 5   Testosterone 1.62 % GEL SMARTSIG:81 Milligram(s) Topical Every Morning     vitamin E 180 MG (400 UNITS) capsule Take 400 Units by mouth every evening.     No current facility-administered medications for this visit.    SURGICAL HISTORY:  Past Surgical History:  Procedure Laterality Date   CRANIOTOMY N/A 11/08/2021   Procedure: ENDOSCOPIC ENDONASAL RESECTION OF SELLAR MASS;  Surgeon: Jadene Pierini, MD;  Location: [facility] OR;  Service: Neurosurgery;  Laterality: N/A;  RM 20   IR RADIOLOGIST EVAL & MGMT  07/14/2021   IR RADIOLOGIST EVAL & MGMT  07/27/2021   IR RADIOLOGIST EVAL & MGMT  01/30/2022   IR RADIOLOGIST EVAL & MGMT  05/08/2023   RADIOFREQUENCY ABLATION N/A 08/23/2021   Procedure: RENAL CRYO ABLATION;  Surgeon: Berdine Dance, MD;  Location: [facility] ORS;  Service: Anesthesiology;  Laterality: N/A;   right shoulder rotator cuff repair Right    ROBOT ASSISTED LAPAROSCOPIC NEPHRECTOMY Left 01/05/2020   Procedure: XI ROBOTIC ASSISTED LAPAROSCOPIC RADICAL NEPHRECTOMY;  Surgeon: Rene Paci, MD;  Location: [facility] ORS;  Service: Urology;  Laterality: Left;   TOTAL HIP ARTHROPLASTY Left 03/31/2020   Procedure: LEFT TOTAL HIP ARTHROPLASTY ANTERIOR APPROACH;  Surgeon: Kathryne Hitch, MD;  Location: [facility] OR;  Service: Orthopedics;  Laterality: Left;   TRANSPHENOIDAL APPROACH EXPOSURE N/A 11/08/2021   Procedure: TRANSPHENOIDAL APPROACH EXPOSURE;  Surgeon: Osborn Coho, MD;  Location: [facility] OR;  Service: ENT;  Laterality: N/A;    REVIEW OF SYSTEMS:   Review of Systems  Constitutional: Negative for appetite change, chills, fatigue, fever and unexpected weight change.  HENT: Negative for mouth sores, nosebleeds, sore throat and trouble swallowing.   Eyes: Negative for eye problems and icterus.  Respiratory: Negative for cough, hemoptysis, shortness of breath and wheezing.   Cardiovascular: Negative for chest pain and leg swelling.  Gastrointestinal: Positive for controlled diarrhea. Negative for abdominal pain, constipation, nausea and vomiting.  Genitourinary: Negative for bladder incontinence, difficulty urinating, dysuria, frequency and hematuria.   Musculoskeletal: Negative for back pain, gait problem, neck pain and neck stiffness.  Skin: Improved rash/dry skin.  Neurological: Negative for dizziness, extremity weakness, gait problem, headaches, light-headedness and seizures.  Hematological: Negative for adenopathy. Does not bruise/bleed easily.  Psychiatric/Behavioral: Negative for confusion, depression and sleep disturbance. The patient  is not nervous/anxious.     PHYSICAL EXAMINATION:  Blood pressure (!) 135/96, pulse 66, temperature (!) 97.4 F (36.3 C), temperature source Temporal, resp. rate 13, weight 225 lb 8 oz (102.3 kg), SpO2 94%.  ECOG PERFORMANCE STATUS: 1  Physical Exam  Constitutional: Oriented to person, place, and time and well-developed, well-nourished, and in no distress.  HENT:  Head: Normocephalic and atraumatic.  Mouth/Throat: Oropharynx is clear and moist. No oropharyngeal exudate.  Eyes: Conjunctivae are normal. Right eye exhibits no discharge. Left eye exhibits no discharge. No scleral icterus.  Neck: Normal range of motion. Neck supple.  Cardiovascular: Normal rate, regular rhythm, normal heart sounds and intact distal pulses.   Pulmonary/Chest: Effort normal and breath sounds normal. No respiratory distress. No  wheezes. No rales.  Abdominal: Soft. Bowel sounds are normal. Exhibits no distension and no mass. There is no tenderness.  Musculoskeletal: Normal range of motion. Exhibits no edema.  Lymphadenopathy:    No cervical adenopathy.  Neurological: Alert and oriented to person, place, and time. Exhibits normal muscle tone. Gait normal. Coordination normal.  Skin: Skin is warm and dry. Not diaphoretic. No erythema. No pallor.  Psychiatric: Mood, memory and judgment normal.  Vitals reviewed.    LABORATORY DATA: Lab Results  Component Value Date   WBC 9.2 05/14/2023   HGB 14.1 05/14/2023   HCT 42.4 05/14/2023   MCV 88.9 05/14/2023   PLT 220 05/14/2023      Chemistry      Component Value Date/Time   NA 140 04/23/2023 1108   NA 140 08/09/2022 1014   K 4.1 04/23/2023 1108   CL 105 04/23/2023 1108   CO2 30 04/23/2023 1108   BUN 21 04/23/2023 1108   BUN 18 08/09/2022 1014   CREATININE 1.23 04/23/2023 1108      Component Value Date/Time   CALCIUM 9.0 04/23/2023 1108   ALKPHOS 54 04/23/2023 1108   AST 28 04/23/2023 1108   ALT 38 04/23/2023 1108   BILITOT 0.3 04/23/2023 1108       RADIOGRAPHIC STUDIES:  IR Radiologist Eval & Mgmt Result Date: 05/08/2023 : Please refer to the dictated note from today in [facility] for this rad eval and management. Electronically Signed   By: Judie Petit.  Shick M.D.   On: 05/08/2023 09:59   CT CHEST ABDOMEN PELVIS WO CONTRAST Result Date: 04/16/2023 CLINICAL DATA:  History of metastatic renal cell carcinoma, restaging. * Tracking Code: BO * EXAM: CT CHEST, ABDOMEN AND PELVIS WITHOUT CONTRAST TECHNIQUE: Multidetector CT imaging of the chest, abdomen and pelvis was performed following the standard protocol without IV contrast. RADIATION DOSE REDUCTION: This exam was performed according to the departmental dose-optimization program which includes automated exposure control, adjustment of the mA and/or kV according to patient size and/or use of iterative  reconstruction technique. COMPARISON:  Multiple priors including CT January 21, 2023 FINDINGS: CT CHEST FINDINGS Cardiovascular: Aortic atherosclerosis. Normal caliber central pulmonary arteries. Normal size heart. Three-vessel coronary artery calcifications. Mediastinum/Nodes: No suspicious thyroid nodule. No pathologically enlarged mediastinal, hilar or axillary lymph nodes. Patulous esophagus. Lungs/Pleura: The band of airspace opacity in the right lung tracking along the major and minor fissure are similar to prior for instance on images 69-89/4. Right lower lobe subpleural pulmonary nodularity measures 2.7 x 1.6 cm on image 107/4 previously 2.7 x 1.5 cm. No new suspicious pulmonary nodules or masses. Musculoskeletal: No aggressive lytic or blastic lesion of bone CT ABDOMEN PELVIS FINDINGS Hepatobiliary: No suspicious hepatic lesion on noncontrast enhanced examination. Cholelithiasis  without findings of acute cholecystitis. No biliary ductal dilation. Pancreas: No pancreatic ductal dilation or evidence of acute inflammation. Spleen: No splenomegaly. Adrenals/Urinary Tract: Stable thickened appearance of the left adrenal gland without discrete nodularity. Right adrenal gland appears normal. Prior left nephrectomy without new suspicious soft tissue in the surgical bed. Ablation site along the right upper pole kidney is similar prior. Urinary bladder is unremarkable for degree of distension. Stomach/Bowel: No radiopaque enteric contrast material was administered. No evidence of bowel obstruction. Left-sided colonic diverticulosis without findings of acute diverticulitis. Vascular/Lymphatic: Aortic atherosclerosis. Smooth IVC contours. No pathologically enlarged abdominal or pelvic lymph nodes. Reproductive: No acute finding or suspicious mass Other: No significant abdominopelvic free fluid. Musculoskeletal: No aggressive lytic or blastic lesion of bone. Multilevel degenerative change of the spine. Left total hip  arthroplasty. IMPRESSION: 1. Stable examination without evidence of new or progressive disease in the chest, abdomen or pelvis. 2. Stable noncontrast enhanced appearance of the ablation site along the right upper pole kidney. 3. Prior left nephrectomy without new suspicious soft tissue in the surgical bed. 4. Stable thickening of the left adrenal gland without discrete nodularity. 5. Cholelithiasis without findings of acute cholecystitis. 6. Left-sided colonic diverticulosis without findings of acute diverticulitis. 7.  Aortic Atherosclerosis (ICD10-I70.0). Electronically Signed   By: Maudry Mayhew M.D.   On: 04/16/2023 11:56     ASSESSMENT/PLAN:  This is a very pleasant 68 year old male diagnosed with renal cell carcinoma.  He was initially diagnosed and 2021.  He was found to have metastatic disease with pulmonary involvement and pituitary metastasis and 2023   The patient underwent robot-assisted laparoscopic left radical nephrectomy by Dr. Liliane Shi on 01/05/2020 that showed a 10.7 cm lesion with extension into the perinephric tissue 0 of 4 lymph nodes involved.  The final pathology was T3a, N0, grade 2 tumor.   He then underwent radiation therapy to the pulmonary nodules which was completed in March 2023.   He then underwent cryoablation to 2 lesions in the right kidney which was performed on 08/23/2021   He then underwent SRS treatment to the pituitary bed which was completed in October 2023.   The patient is currently undergoing pembrolizumab 200 mg IV every 3 weeks which is started on 08/04/2021. He is status post 25 cycles.  He is also on Axitnib 5 mg daily which was started in February 09, 2022.    Labs were reviewed.  Recommend that he proceed with cycle #26 today as scheduled.   We will see him back for follow-up visit in 3 weeks for evaluation repeat blood work before undergoing cycle #27.    He will continue to use imodium for diarrhea.  The patient was advised to call immediately if  he has any concerning symptoms in the interval. The patient voices understanding of current disease status and treatment options and is in agreement with the current care plan. All questions were answered. The patient knows to call the clinic with any problems, questions or concerns. We can certainly see the patient much sooner if necessary   No orders of the defined types were placed in this encounter. The total time spent in the appointment was 20-29 minutes  Telicia Hodgkiss L Heron Pitcock, PA-C 05/14/23

## 2023-05-14 ENCOUNTER — Inpatient Hospital Stay: Payer: Medicare Other

## 2023-05-14 ENCOUNTER — Other Ambulatory Visit: Payer: Self-pay | Admitting: Physician Assistant

## 2023-05-14 ENCOUNTER — Inpatient Hospital Stay: Payer: Medicare Other | Attending: Oncology

## 2023-05-14 ENCOUNTER — Inpatient Hospital Stay (HOSPITAL_BASED_OUTPATIENT_CLINIC_OR_DEPARTMENT_OTHER): Payer: Medicare Other | Admitting: Physician Assistant

## 2023-05-14 VITALS — BP 135/96 | HR 66 | Temp 97.4°F | Resp 13 | Wt 225.5 lb

## 2023-05-14 VITALS — BP 131/85

## 2023-05-14 DIAGNOSIS — Z5112 Encounter for antineoplastic immunotherapy: Secondary | ICD-10-CM

## 2023-05-14 DIAGNOSIS — C7989 Secondary malignant neoplasm of other specified sites: Secondary | ICD-10-CM | POA: Insufficient documentation

## 2023-05-14 DIAGNOSIS — C7801 Secondary malignant neoplasm of right lung: Secondary | ICD-10-CM | POA: Diagnosis not present

## 2023-05-14 DIAGNOSIS — C642 Malignant neoplasm of left kidney, except renal pelvis: Secondary | ICD-10-CM | POA: Insufficient documentation

## 2023-05-14 DIAGNOSIS — C649 Malignant neoplasm of unspecified kidney, except renal pelvis: Secondary | ICD-10-CM | POA: Diagnosis not present

## 2023-05-14 LAB — CMP (CANCER CENTER ONLY)
ALT: 43 U/L (ref 0–44)
AST: 34 U/L (ref 15–41)
Albumin: 4.5 g/dL (ref 3.5–5.0)
Alkaline Phosphatase: 59 U/L (ref 38–126)
Anion gap: 6 (ref 5–15)
BUN: 21 mg/dL (ref 8–23)
CO2: 27 mmol/L (ref 22–32)
Calcium: 9.7 mg/dL (ref 8.9–10.3)
Chloride: 105 mmol/L (ref 98–111)
Creatinine: 1.28 mg/dL — ABNORMAL HIGH (ref 0.61–1.24)
GFR, Estimated: >60 mL/min (ref >60)
Glucose, Bld: 116 mg/dL — ABNORMAL HIGH (ref 70–99)
Potassium: 4.6 mmol/L (ref 3.5–5.1)
Sodium: 138 mmol/L (ref 135–145)
Total Bilirubin: 0.4 mg/dL (ref 0.0–1.2)
Total Protein: 7 g/dL (ref 6.5–8.1)

## 2023-05-14 LAB — CBC WITH DIFFERENTIAL (CANCER CENTER ONLY)
Abs Immature Granulocytes: 0.03 10*3/uL (ref 0.00–0.07)
Basophils Absolute: 0.1 10*3/uL (ref 0.0–0.1)
Basophils Relative: 1 %
Eosinophils Absolute: 0 10*3/uL (ref 0.0–0.5)
Eosinophils Relative: 0 %
HCT: 42.4 % (ref 39.0–52.0)
Hemoglobin: 14.1 g/dL (ref 13.0–17.0)
Immature Granulocytes: 0 %
Lymphocytes Relative: 20 %
Lymphs Abs: 1.8 10*3/uL (ref 0.7–4.0)
MCH: 29.6 pg (ref 26.0–34.0)
MCHC: 33.3 g/dL (ref 30.0–36.0)
MCV: 88.9 fL (ref 80.0–100.0)
Monocytes Absolute: 0.5 10*3/uL (ref 0.1–1.0)
Monocytes Relative: 5 %
Neutro Abs: 6.8 10*3/uL (ref 1.7–7.7)
Neutrophils Relative %: 74 %
Platelet Count: 220 10*3/uL (ref 150–400)
RBC: 4.77 MIL/uL (ref 4.22–5.81)
RDW: 15.2 % (ref 11.5–15.5)
WBC Count: 9.2 10*3/uL (ref 4.0–10.5)
nRBC: 0 % (ref 0.0–0.2)

## 2023-05-14 MED ORDER — SODIUM CHLORIDE 0.9 % IV SOLN
200.0000 mg | Freq: Once | INTRAVENOUS | Status: AC
  Administered 2023-05-14: 200 mg via INTRAVENOUS
  Filled 2023-05-14: qty 200

## 2023-05-14 MED ORDER — SODIUM CHLORIDE 0.9 % IV SOLN: Freq: Once | INTRAVENOUS | Status: AC

## 2023-05-14 NOTE — Patient Instructions (Signed)

## 2023-05-28 NOTE — Progress Notes (Signed)
 [facility] OFFICE PROGRESS NOTE  [name], [place], [name] 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 Liliane Shi on January 05, 2020.  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  The final pathological staging was T3aN0 grade 2 tumor.   2) Status post radiation therapy to the pulmonary nodules completed March 2023.  He received 50 Gray in 5 fractions. 3) Status post cryoablation to 2 lesions in the right kidney completed on Aug 23, 2021. 4) status post endoscopic transsphenoidal pituitary resection completed by Dr. Annalee Genta on November 08, 2021.  The final pathology showed clear-cell renal cell carcinoma. 5) SRS treatment to pituitary bed completed in October 2023.  He received 5 fractions for a total of 25 Gray.  CURRENT THERAPY: Keytruda 200 Mg IV every 3 weeks started Aug 04, 2021 status post 26 cycles with axitinib 5 mg p.o. daily started February 09, 2022.   INTERVAL HISTORY: Marc [name] 68 y.o. male returns to the clinic today for a follow-up visit. He is feeling fairly well today without any concerning complaints. He is currently on treatment with  Keytruda every 3 weeks and Axitinib p.o. daily.  The patient is tolerating treatment well without any concerning adverse side effects except for diarrhea but he controls this with imodium and denies any new concerns with regard to this.    The patient denies any recent fever, chills, night sweats, unexplained weight loss. He has been trying to be more active with walking every day. He denies any appetite changes. He denies any rashes or skin changes . He previously had some rashes/drys skin on  his hands has cleared up since last being seen. Denies any chest pain, shortness of breath, cough, or hemoptysis.  Denies any nausea, vomiting, or constipation. Denies any dysuria, malodorous urine, urinary frequency or urgency.  Denies any headache or visual changes.  He is followed by Dr. Barbaraann Cao and IR. He is here for evaluation and repeat blood work before undergoing cycle #27.     MEDICAL HISTORY: Past Medical History:  Diagnosis Date   Arthritis    Cancer (HCC)    Kidney, Lung, Pituitary   Hyperlipidemia    Hypothyroidism     ALLERGIES:  has no known allergies.  MEDICATIONS:  Current Outpatient Medications  Medication Sig Dispense Refill   Ascorbic Acid (VITAMIN C PO) Take 1 tablet by mouth every evening.     aspirin EC 81 MG tablet Take 81 mg by mouth every evening. Swallow whole.     chlorhexidine (PERIDEX) 0.12 % solution SMARTSIG:By Mouth     cholecalciferol (VITAMIN D3) 25 MCG (1000 UNIT) tablet Take 1,000 Units by mouth every evening.     Ferrous Sulfate (IRON SLOW RELEASE PO) Take 1 tablet by mouth every evening.     HYDROcodone-acetaminophen (NORCO) 10-325 MG tablet Take by mouth.     ibuprofen (ADVIL) 200 MG tablet Take 400-600 mg by mouth every 6 (six) hours as needed for moderate pain.     INLYTA 5 MG tablet TAKE 1 TABLET BY MOUTH DAILY 30 tablet 3   levothyroxine (SYNTHROID) 88 MCG tablet Take 1 tablet (88 mcg total) by mouth daily. 90 tablet 3  Multiple Vitamin (MULTI VITAMIN) TABS 1 tablet Orally Once a day for 30 day(s)     Multiple Vitamins-Minerals (MULTIVITAMIN WITH MINERALS) tablet Take 1 tablet by mouth every evening.     Omega-3 Fatty Acids (FISH OIL PO) Take 1 capsule by mouth every evening.     ondansetron (ZOFRAN-ODT) 8 MG disintegrating tablet Take 8 mg by mouth daily.     predniSONE (DELTASONE) 5 MG tablet Take 1.5 tablets (7.5 mg total) by mouth daily with breakfast. Pt may double up during sick days 150 tablet 3   prochlorperazine (COMPAZINE) 10 MG  tablet Take 1 tablet (10 mg total) by mouth every 6 (six) hours as needed for nausea or vomiting. 30 tablet 0   rosuvastatin (CRESTOR) 20 MG tablet TAKE 1 TABLET(20 MG) BY MOUTH DAILY 90 tablet 1   Testosterone (ANDROGEL) 20.25 MG/1.25GM (1.62%) GEL Place 3 Pump onto the skin as directed. 150 g 5   Testosterone 1.62 % GEL SMARTSIG:81 Milligram(s) Topical Every Morning     vitamin E 180 MG (400 UNITS) capsule Take 400 Units by mouth every evening.     No current facility-administered medications for this visit.    SURGICAL HISTORY:  Past Surgical History:  Procedure Laterality Date   CRANIOTOMY N/A 11/08/2021   Procedure: ENDOSCOPIC ENDONASAL RESECTION OF SELLAR MASS;  Surgeon: Jadene Pierini, MD;  Location: [facility] OR;  Service: Neurosurgery;  Laterality: N/A;  RM 20   IR RADIOLOGIST EVAL & MGMT  07/14/2021   IR RADIOLOGIST EVAL & MGMT  07/27/2021   IR RADIOLOGIST EVAL & MGMT  01/30/2022   IR RADIOLOGIST EVAL & MGMT  05/08/2023   RADIOFREQUENCY ABLATION N/A 08/23/2021   Procedure: RENAL CRYO ABLATION;  Surgeon: Berdine Dance, MD;  Location: [facility] ORS;  Service: Anesthesiology;  Laterality: N/A;   right shoulder rotator cuff repair Right    ROBOT ASSISTED LAPAROSCOPIC NEPHRECTOMY Left 01/05/2020   Procedure: XI ROBOTIC ASSISTED LAPAROSCOPIC RADICAL NEPHRECTOMY;  Surgeon: Rene Paci, MD;  Location: [facility]S;  Service: Urology;  Laterality: Left;   TOTAL HIP ARTHROPLASTY Left 03/31/2020   Procedure: LEFT TOTAL HIP ARTHROPLASTY ANTERIOR APPROACH;  Surgeon: Kathryne Hitch, MD;  Location: [facility] OR;  Service: Orthopedics;  Laterality: Left;   TRANSPHENOIDAL APPROACH EXPOSURE N/A 11/08/2021   Procedure: TRANSPHENOIDAL APPROACH EXPOSURE;  Surgeon: Osborn Coho, MD;  Location: [facility] OR;  Service: ENT;  Laterality: N/A;    REVIEW OF SYSTEMS:   Constitutional: Negative for appetite change, chills, fatigue, fever and unexpected weight change.  HENT: Negative for mouth sores, nosebleeds,  sore throat and trouble swallowing.   Eyes: Negative for eye problems and icterus.  Respiratory: Negative for cough, hemoptysis, shortness of breath and wheezing.   Cardiovascular: Negative for chest pain and leg swelling.  Gastrointestinal: Positive for controlled diarrhea. Negative for abdominal pain, constipation, nausea and vomiting.  Genitourinary: Negative for bladder incontinence, difficulty urinating, dysuria, frequency and hematuria.   Musculoskeletal: Negative for back pain, gait problem, neck pain and neck stiffness.  Skin: Improved rash/dry skin.  Neurological: Negative for dizziness, extremity weakness, gait problem, headaches, light-headedness and seizures.  Hematological: Negative for adenopathy. Does not bruise/bleed easily.  Psychiatric/Behavioral: Negative for confusion, depression and sleep disturbance. The patient is not nervous/anxious.     PHYSICAL EXAMINATION:  There were no vitals taken for this visit.  ECOG PERFORMANCE STATUS: 1  Physical Exam  Constitutional: Oriented to person, place, and time and well-developed, well-nourished, and in no distress.  HENT:  Head: Normocephalic and atraumatic.  Mouth/Throat: Oropharynx is clear and moist. No oropharyngeal exudate.  Eyes: Conjunctivae are normal. Right eye exhibits no discharge. Left eye exhibits no discharge. No scleral icterus.  Neck: Normal range of motion. Neck supple.  Cardiovascular: Normal rate, regular rhythm, normal heart sounds and intact distal pulses.   Pulmonary/Chest: Effort normal and breath sounds normal. No respiratory distress. No wheezes. No rales.  Abdominal: Soft. Bowel sounds are normal. Exhibits no distension and no mass. There is no tenderness.  Musculoskeletal: Normal range of motion. Exhibits no edema.  Lymphadenopathy:    No cervical adenopathy.  Neurological: Alert and oriented to person, place, and time. Exhibits normal muscle tone. Gait normal. Coordination normal.  Skin: Skin is  warm and dry. Not diaphoretic. No erythema. No pallor.  Psychiatric: Mood, memory and judgment normal.  Vitals reviewed.  LABORATORY DATA: Lab Results  Component Value Date   WBC 10.0 06/03/2023   HGB 14.8 06/03/2023   HCT 45.4 06/03/2023   MCV 91.3 06/03/2023   PLT 211 06/03/2023      Chemistry      Component Value Date/Time   NA 139 06/03/2023 1101   NA 140 08/09/2022 1014   K 4.3 06/03/2023 1101   CL 103 06/03/2023 1101   CO2 29 06/03/2023 1101   BUN 20 06/03/2023 1101   BUN 18 08/09/2022 1014   CREATININE 1.29 (H) 06/03/2023 1101      Component Value Date/Time   CALCIUM 9.4 06/03/2023 1101   ALKPHOS 55 06/03/2023 1101   AST 31 06/03/2023 1101   ALT 41 06/03/2023 1101   BILITOT 0.5 06/03/2023 1101       RADIOGRAPHIC STUDIES:  MR BRAIN W WO CONTRAST Result Date: 05/30/2023 CLINICAL DATA:  Brain/CNS neoplasm, assess treatment response. History of renal cell carcinoma with pituitary metastasis status post debulking and SRS in 2023. EXAM: MRI HEAD WITHOUT AND WITH CONTRAST TECHNIQUE: Multiplanar, multiecho pulse sequences of the brain and surrounding structures were obtained without and with intravenous contrast. CONTRAST:  10 mL Vueway COMPARISON:  Head MRI 01/30/2023 FINDINGS: Brain: Sequelae of trans-sphenoidal tumor resection are again identified. Residual enhancing soft tissue eccentric Leigh located in the right aspect of the sella has not significantly changed. Enhancing infundibular thickening is also unchanged. No new enhancing intracranial lesion, acute infarct, intracranial hemorrhage, midline shift, or extra-axial fluid collection is identified. Scattered small T2 hyperintensities in the cerebral white matter and pons are unchanged and nonspecific but compatible with mild chronic small vessel ischemic disease. There is mild cerebral atrophy. Vascular: Major intracranial vascular flow voids are preserved. Skull and upper cervical spine: No suspicious marrow lesion.  Sinuses/Orbits: Unremarkable orbits. Minimal mucosal thickening in the paranasal sinuses. Small left mastoid effusion. Other: Asymmetric fatty atrophy of the right parotid gland. IMPRESSION: 1. Unchanged residual enhancing tissue in the right aspect of the sella. 2. No evidence of new intracranial metastases. Electronically Signed   By: Sebastian Ache M.D.   On: 05/30/2023 11:25   IR Radiologist Eval & Mgmt Result Date: 05/08/2023 : Please refer to the dictated note from today in [facility] for this rad eval and management. Electronically Signed   By: Judie Petit.  Shick M.D.   On: 05/08/2023 09:59     ASSESSMENT/PLAN:  This is a very pleasant 68 year old male diagnosed with renal cell carcinoma.  He was initially diagnosed and 2021.  He was found to have metastatic disease with pulmonary involvement and pituitary metastasis and 2023   The patient underwent robot-assisted laparoscopic left radical nephrectomy by Dr.  Winter on 01/05/2020 that showed a 10.7 cm lesion with extension into the perinephric tissue 0 of 4 lymph nodes involved.  The final pathology was T3a, N0, grade 2 tumor.   He then underwent radiation therapy to the pulmonary nodules which was completed in March 2023.   He then underwent cryoablation to 2 lesions in the right kidney which was performed on 08/23/2021   He then underwent SRS treatment to the pituitary bed which was completed in October 2023.  he patient is currently undergoing pembrolizumab 200 mg IV every 3 weeks which is started on 08/04/2021. He is status post 25 cycles.  He is also on Axitnib 5 mg daily which was started in February 09, 2022.    Labs were reviewed.  Recommend that he proceed with cycle #27 today as scheduled.   We will see him back for follow-up visit in 3 weeks for evaluation repeat blood work before undergoing cycle #28   He will continue to use imodium for diarrhea.   The patient was advised to call immediately if he has any concerning symptoms in the  interval. The patient voices understanding of current disease status and treatment options and is in agreement with the current care plan. All questions were answered. The patient knows to call the clinic with any problems, questions or concerns. We can certainly see the patient much sooner if necessary    No orders of the defined types were placed in this encounter.    The total time spent in the appointment was 20-29 minutes  Tracie Lindbloom L Kareema Keitt, PA-C 06/03/23

## 2023-05-30 ENCOUNTER — Ambulatory Visit
Admission: RE | Admit: 2023-05-30 | Discharge: 2023-05-30 | Disposition: A | Discharge status: Home / Self Care | Payer: Medicare Other | Source: Ambulatory Visit | Attending: Internal Medicine | Admitting: Internal Medicine

## 2023-05-30 DIAGNOSIS — C7931 Secondary malignant neoplasm of brain: Secondary | ICD-10-CM

## 2023-05-30 MED ORDER — GADOPICLENOL 0.5 MMOL/ML IV SOLN
10.0000 mL | Freq: Once | INTRAVENOUS | Status: AC | PRN
  Administered 2023-05-30: 10 mL via INTRAVENOUS

## 2023-06-03 ENCOUNTER — Inpatient Hospital Stay (HOSPITAL_BASED_OUTPATIENT_CLINIC_OR_DEPARTMENT_OTHER): Payer: Medicare Other | Admitting: Physician Assistant

## 2023-06-03 ENCOUNTER — Encounter: Payer: Self-pay | Admitting: Physician Assistant

## 2023-06-03 ENCOUNTER — Inpatient Hospital Stay: Payer: Medicare Other

## 2023-06-03 ENCOUNTER — Inpatient Hospital Stay: Payer: Medicare Other | Attending: Oncology | Admitting: Internal Medicine

## 2023-06-03 VITALS — BP 141/87 | HR 66 | Temp 97.7°F | Resp 16 | Wt 225.7 lb

## 2023-06-03 DIAGNOSIS — C642 Malignant neoplasm of left kidney, except renal pelvis: Secondary | ICD-10-CM

## 2023-06-03 DIAGNOSIS — Z5112 Encounter for antineoplastic immunotherapy: Secondary | ICD-10-CM | POA: Insufficient documentation

## 2023-06-03 DIAGNOSIS — C641 Malignant neoplasm of right kidney, except renal pelvis: Secondary | ICD-10-CM | POA: Insufficient documentation

## 2023-06-03 DIAGNOSIS — R197 Diarrhea, unspecified: Secondary | ICD-10-CM | POA: Insufficient documentation

## 2023-06-03 DIAGNOSIS — C7931 Secondary malignant neoplasm of brain: Secondary | ICD-10-CM | POA: Insufficient documentation

## 2023-06-03 DIAGNOSIS — Z79620 Long term (current) use of immunosuppressive biologic: Secondary | ICD-10-CM | POA: Diagnosis not present

## 2023-06-03 LAB — CMP (CANCER CENTER ONLY)
ALT: 41 U/L (ref 0–44)
AST: 31 U/L (ref 15–41)
Albumin: 4.4 g/dL (ref 3.5–5.0)
Alkaline Phosphatase: 55 U/L (ref 38–126)
Anion gap: 7 (ref 5–15)
BUN: 20 mg/dL (ref 8–23)
CO2: 29 mmol/L (ref 22–32)
Calcium: 9.4 mg/dL (ref 8.9–10.3)
Chloride: 103 mmol/L (ref 98–111)
Creatinine: 1.29 mg/dL — ABNORMAL HIGH (ref 0.61–1.24)
GFR, Estimated: >60 mL/min (ref >60)
Glucose, Bld: 86 mg/dL (ref 70–99)
Potassium: 4.3 mmol/L (ref 3.5–5.1)
Sodium: 139 mmol/L (ref 135–145)
Total Bilirubin: 0.5 mg/dL (ref 0.0–1.2)
Total Protein: 7 g/dL (ref 6.5–8.1)

## 2023-06-03 LAB — CBC WITH DIFFERENTIAL (CANCER CENTER ONLY)
Abs Immature Granulocytes: 0.03 10*3/uL (ref 0.00–0.07)
Basophils Absolute: 0.1 10*3/uL (ref 0.0–0.1)
Basophils Relative: 1 %
Eosinophils Absolute: 0.1 10*3/uL (ref 0.0–0.5)
Eosinophils Relative: 1 %
HCT: 45.4 % (ref 39.0–52.0)
Hemoglobin: 14.8 g/dL (ref 13.0–17.0)
Immature Granulocytes: 0 %
Lymphocytes Relative: 23 %
Lymphs Abs: 2.3 10*3/uL (ref 0.7–4.0)
MCH: 29.8 pg (ref 26.0–34.0)
MCHC: 32.6 g/dL (ref 30.0–36.0)
MCV: 91.3 fL (ref 80.0–100.0)
Monocytes Absolute: 0.8 10*3/uL (ref 0.1–1.0)
Monocytes Relative: 8 %
Neutro Abs: 6.6 10*3/uL (ref 1.7–7.7)
Neutrophils Relative %: 67 %
Platelet Count: 211 10*3/uL (ref 150–400)
RBC: 4.97 MIL/uL (ref 4.22–5.81)
RDW: 15 % (ref 11.5–15.5)
WBC Count: 10 10*3/uL (ref 4.0–10.5)
nRBC: 0 % (ref 0.0–0.2)

## 2023-06-03 LAB — TSH: TSH: <0.01 u[IU]/mL — ABNORMAL LOW (ref 0.350–4.500)

## 2023-06-03 MED ORDER — SODIUM CHLORIDE 0.9 % IV SOLN: Freq: Once | INTRAVENOUS | Status: AC

## 2023-06-03 MED ORDER — SODIUM CHLORIDE 0.9 % IV SOLN
200.0000 mg | Freq: Once | INTRAVENOUS | Status: AC
  Administered 2023-06-03: 200 mg via INTRAVENOUS
  Filled 2023-06-03: qty 200

## 2023-06-03 NOTE — Patient Instructions (Signed)

## 2023-06-03 NOTE — Progress Notes (Signed)
 Marc [name] [address]  Marc [name], Marc [name] [phone]   Interval Evaluation  Date of Service: 06/03/23 Patient Name: Marc [name] Patient MRN: [id number] Patient DOB: May 24, 1955 Provider: Henreitta Leber, MD  Identifying Statement:  Marc [name] is a 68 y.o. male with Metastasis to brain [facility]) - Plan: MR BRAIN W WO CONTRAST   Primary Cancer:  Oncologic History: Oncology History  Kidney cancer, primary, with metastasis from kidney to other site [facility])  06/01/2021 Initial Diagnosis   Kidney cancer, primary, with metastasis from kidney to other site [facility])   06/01/2021 Cancer Staging   Staging form: Kidney, AJCC 8th Edition - Clinical: Stage IV (cT3a, cN0, cM1) - Signed by Benjiman Core, MD on 06/01/2021   12/05/2021 -  Chemotherapy   Patient is on Treatment Plan : Renal Cell Carcinoma Pembrolizumab (200) q21d     Renal cell carcinoma of right kidney (HCC)  06/13/2021 Initial Diagnosis   Renal cell carcinoma of right kidney (HCC)   12/05/2021 - 12/05/2021 Chemotherapy   Patient is on Treatment Plan : HEAD/NECK Pembrolizumab (200) q21d      CNS Oncologic History 11/08/21: Debulking of pituitary mass with Dr. Maurice Small, path is renal cell carcinoma 01/22/22: Completes SRS 25/5 with Dr. Kathrynn Running  Interval History: Marc [name] presents today for follow up after recent MRI.  He denies new or progressive neurologic symptoms.  Still has burden of GI effects from systemic therapy.  Continues to follow with Dr. Arbutus Ped.  H+P (02/04/23) Patient presents today for evaluation following MRI brain.  He denies any worsening of his vision.  Gait is independent, no headaches or seizures.  Continues on Keytruda with Dr. Arbutus Ped for RCC, tolerating well aside from diarrhea.  Medications: Current Outpatient Medications on File Prior to Visit  Medication Sig Dispense Refill   Ascorbic Acid (VITAMIN C PO) Take 1 tablet by mouth every evening.      aspirin EC 81 MG tablet Take 81 mg by mouth every evening. Swallow whole.     chlorhexidine (PERIDEX) 0.12 % solution SMARTSIG:By Mouth     cholecalciferol (VITAMIN D3) 25 MCG (1000 UNIT) tablet Take 1,000 Units by mouth every evening.     Ferrous Sulfate (IRON SLOW RELEASE PO) Take 1 tablet by mouth every evening.     HYDROcodone-acetaminophen (NORCO) 10-325 MG tablet Take by mouth.     ibuprofen (ADVIL) 200 MG tablet Take 400-600 mg by mouth every 6 (six) hours as needed for moderate pain.     INLYTA 5 MG tablet TAKE 1 TABLET BY MOUTH DAILY 30 tablet 3   levothyroxine (SYNTHROID) 88 MCG tablet Take 1 tablet (88 mcg total) by mouth daily. 90 tablet 3   Multiple Vitamin (MULTI VITAMIN) TABS 1 tablet Orally Once a day for 30 day(s)     Multiple Vitamins-Minerals (MULTIVITAMIN WITH MINERALS) tablet Take 1 tablet by mouth every evening.     Omega-3 Fatty Acids (FISH OIL PO) Take 1 capsule by mouth every evening.     ondansetron (ZOFRAN-ODT) 8 MG disintegrating tablet Take 8 mg by mouth daily.     predniSONE (DELTASONE) 5 MG tablet Take 1.5 tablets (7.5 mg total) by mouth daily with breakfast. Pt may double up during sick days 150 tablet 3   prochlorperazine (COMPAZINE) 10 MG tablet Take 1 tablet (10 mg total) by mouth every 6 (six) hours as needed for nausea or vomiting. 30 tablet 0   rosuvastatin (CRESTOR) 20 MG  tablet TAKE 1 TABLET(20 MG) BY MOUTH DAILY 90 tablet 1   Testosterone (ANDROGEL) 20.25 MG/1.25GM (1.62%) GEL Place 3 Pump onto the skin as directed. 150 g 5   Testosterone 1.62 % GEL SMARTSIG:81 Milligram(s) Topical Every Morning     vitamin E 180 MG (400 UNITS) capsule Take 400 Units by mouth every evening.     No current facility-administered medications on file prior to visit.    Allergies: No Known Allergies Past Medical History:  Past Medical History:  Diagnosis Date   Arthritis    Cancer (HCC)    Kidney, Lung, Pituitary   Hyperlipidemia    Hypothyroidism    Past Surgical  History:  Past Surgical History:  Procedure Laterality Date   CRANIOTOMY N/A 11/08/2021   Procedure: ENDOSCOPIC ENDONASAL RESECTION OF SELLAR MASS;  Surgeon: Jadene Pierini, MD;  Location: [facility] OR;  Service: Neurosurgery;  Laterality: N/A;  RM 20   IR RADIOLOGIST EVAL & MGMT  07/14/2021   IR RADIOLOGIST EVAL & MGMT  07/27/2021   IR RADIOLOGIST EVAL & MGMT  01/30/2022   IR RADIOLOGIST EVAL & MGMT  05/08/2023   RADIOFREQUENCY ABLATION N/A 08/23/2021   Procedure: RENAL CRYO ABLATION;  Surgeon: Berdine Dance, MD;  Location: [facility];  Service: Anesthesiology;  Laterality: N/A;   right shoulder rotator cuff repair Right    ROBOT ASSISTED LAPAROSCOPIC NEPHRECTOMY Left 01/05/2020   Procedure: XI ROBOTIC ASSISTED LAPAROSCOPIC RADICAL NEPHRECTOMY;  Surgeon: Rene Paci, MD;  Location: [facility];  Service: Urology;  Laterality: Left;   TOTAL HIP ARTHROPLASTY Left 03/31/2020   Procedure: LEFT TOTAL HIP ARTHROPLASTY ANTERIOR APPROACH;  Surgeon: Kathryne Hitch, MD;  Location: [facility] OR;  Service: Orthopedics;  Laterality: Left;   TRANSPHENOIDAL APPROACH EXPOSURE N/A 11/08/2021   Procedure: TRANSPHENOIDAL APPROACH EXPOSURE;  Surgeon: Osborn Coho, MD;  Location: [facility] OR;  Service: ENT;  Laterality: N/A;   Social History:  Social History   Socioeconomic History   Marital status: Single    Spouse name: Not on file   Number of children: 2   Years of education: Not on file   Highest education level: Not on file  Occupational History   Not on file  Tobacco Use   Smoking status: Former    Current packs/day: 0.00    Types: Cigarettes    Start date: 04/02/1985    Quit date: 04/03/2015    Years since quitting: 8.1   Smokeless tobacco: Never  Vaping Use   Vaping status: Former   Quit date: 04/16/2021   Substances: Nicotine, Flavoring  Substance and Sexual Activity   Alcohol use: Yes    Comment: occas    Drug use: No   Sexual activity: Not on file  Other Topics Concern   Not on file   Social History Narrative   Not on file   Social Drivers of Health   Financial Resource Strain: Not on file  Food Insecurity: No Food Insecurity (11/01/2022)   Hunger Vital Sign    Worried About Running Out of Food in the Last Year: Never true    Ran Out of Food in the Last Year: Never true  Transportation Needs: No Transportation Needs (11/01/2022)   PRAPARE - Administrator, Civil Service (Medical): No    Lack of Transportation (Non-Medical): No  Physical Activity: Not on file  Stress: Not on file  Social Connections: Not on file  Intimate Partner Violence: Not At Risk (11/01/2022)   Humiliation, Afraid, Rape, and Kick questionnaire  Fear of Current or Ex-Partner: No    Emotionally Abused: No    Physically Abused: No    Sexually Abused: No   Family History: No family history on file.  Review of Systems: Constitutional: Doesn't report fevers, chills or abnormal weight loss Eyes: Doesn't report blurriness of vision Ears, nose, mouth, throat, and face: Doesn't report sore throat Respiratory: Doesn't report cough, dyspnea or wheezes Cardiovascular: Doesn't report palpitation, chest discomfort  Gastrointestinal:  Doesn't report nausea, constipation, diarrhea GU: Doesn't report incontinence Skin: Doesn't report skin rashes Neurological: Per HPI Musculoskeletal: Doesn't report joint pain Behavioral/Psych: Doesn't report anxiety  Physical Exam: Vitals:   06/03/23 1031  BP: (!) 141/87  Pulse: 66  Resp: 16  Temp: 97.7 F (36.5 C)  SpO2: 99%   KPS: 90. General: Alert, cooperative, pleasant, in no acute distress Head: Normal EENT: No conjunctival injection or scleral icterus.  Lungs: Resp effort normal Cardiac: Regular rate Abdomen: Non-distended abdomen Skin: No rashes cyanosis or petechiae. Extremities: No clubbing or edema  Neurologic Exam: Mental Status: Awake, alert, attentive to examiner. Oriented to self and environment. Language is fluent with intact  comprehension.  Cranial Nerves: Visual acuity is grossly normal. Visual fields are full. Extra-ocular movements intact. No ptosis. Face is symmetric Motor: Tone and bulk are normal. Power is full in both arms and legs. Reflexes are symmetric, no pathologic reflexes present.  Sensory: Intact to light touch Gait: Normal.   Labs: I have reviewed the data as listed    Component Value Date/Time   NA 139 06/03/2023 1101   NA 140 08/09/2022 1014   K 4.3 06/03/2023 1101   CL 103 06/03/2023 1101   CO2 29 06/03/2023 1101   GLUCOSE 86 06/03/2023 1101   BUN 20 06/03/2023 1101   BUN 18 08/09/2022 1014   CREATININE 1.29 (H) 06/03/2023 1101   CALCIUM 9.4 06/03/2023 1101   PROT 7.0 06/03/2023 1101   ALBUMIN 4.4 06/03/2023 1101   AST 31 06/03/2023 1101   ALT 41 06/03/2023 1101   ALKPHOS 55 06/03/2023 1101   BILITOT 0.5 06/03/2023 1101   GFRNONAA >60 06/03/2023 1101   GFRAA >60 01/05/2020 0550   Lab Results  Component Value Date   WBC 10.0 06/03/2023   NEUTROABS 6.6 06/03/2023   HGB 14.8 06/03/2023   HCT 45.4 06/03/2023   MCV 91.3 06/03/2023   PLT 211 06/03/2023    Imaging:  MR BRAIN W WO CONTRAST Result Date: 05/30/2023 CLINICAL DATA:  Brain/CNS neoplasm, assess treatment response. History of renal cell carcinoma with pituitary metastasis status post debulking and SRS in 2023. EXAM: MRI HEAD WITHOUT AND WITH CONTRAST TECHNIQUE: Multiplanar, multiecho pulse sequences of the brain and surrounding structures were obtained without and with intravenous contrast. CONTRAST:  10 mL Vueway COMPARISON:  Head MRI 01/30/2023 FINDINGS: Brain: Sequelae of trans-sphenoidal tumor resection are again identified. Residual enhancing soft tissue eccentric Leigh located in the right aspect of the sella has not significantly changed. Enhancing infundibular thickening is also unchanged. No new enhancing intracranial lesion, acute infarct, intracranial hemorrhage, midline shift, or extra-axial fluid collection is  identified. Scattered small T2 hyperintensities in the cerebral white matter and pons are unchanged and nonspecific but compatible with mild chronic small vessel ischemic disease. There is mild cerebral atrophy. Vascular: Major intracranial vascular flow voids are preserved. Skull and upper cervical spine: No suspicious marrow lesion. Sinuses/Orbits: Unremarkable orbits. Minimal mucosal thickening in the paranasal sinuses. Small left mastoid effusion. Other: Asymmetric fatty atrophy of the right parotid gland.  IMPRESSION: 1. Unchanged residual enhancing tissue in the right aspect of the sella. 2. No evidence of new intracranial metastases. Electronically Signed   By: Sebastian Ache M.D.   On: 05/30/2023 11:25   IR Radiologist Eval & Mgmt Result Date: 05/08/2023 : Please refer to the dictated note from today in [facility] for this rad eval and management. Electronically Signed   By: Judie Petit.  Shick M.D.   On: 05/08/2023 09:59    [facility] Clinician Interpretation: I have personally reviewed the radiological images as listed.  My interpretation, in the context of the patient's clinical presentation, is stable disease   Assessment/Plan Metastasis to brain [facility]) - Plan: MR BRAIN W WO CONTRAST  ARMIN YERGER is clinically and radiographically stable today.  No new or progressive deficits.  We recommended continued MRI surveillance.  We appreciate the opportunity to participate in the care of MACKSON BOTZ.   We ask that MATEUSZ NEILAN return to clinic in 4 months following next brain MRI, or sooner as needed.  All questions were answered. The patient knows to call the clinic with any problems, questions or concerns. No barriers to learning were detected.  The total time spent in the encounter was 30 minutes and more than 50% was on counseling and review of test results   Marc Leber, MD Medical Director of Neuro-Oncology [facility] 06/03/23 12:27 PM

## 2023-06-04 ENCOUNTER — Encounter: Payer: Self-pay | Admitting: Medical Oncology

## 2023-06-04 ENCOUNTER — Other Ambulatory Visit: Payer: Self-pay

## 2023-06-04 LAB — T4: T4, Total: 6 ug/dL (ref 4.5–12.0)

## 2023-06-22 ENCOUNTER — Other Ambulatory Visit: Payer: Self-pay | Admitting: Internal Medicine

## 2023-06-25 ENCOUNTER — Encounter: Payer: Self-pay | Admitting: Internal Medicine

## 2023-06-25 ENCOUNTER — Inpatient Hospital Stay: Payer: Medicare Other

## 2023-06-25 ENCOUNTER — Inpatient Hospital Stay (HOSPITAL_BASED_OUTPATIENT_CLINIC_OR_DEPARTMENT_OTHER): Payer: Medicare Other | Admitting: Internal Medicine

## 2023-06-25 ENCOUNTER — Other Ambulatory Visit: Payer: Self-pay | Admitting: Physician Assistant

## 2023-06-25 VITALS — BP 116/76 | HR 65 | Temp 97.8°F | Resp 17 | Wt 226.0 lb

## 2023-06-25 DIAGNOSIS — C642 Malignant neoplasm of left kidney, except renal pelvis: Secondary | ICD-10-CM

## 2023-06-25 DIAGNOSIS — Z5112 Encounter for antineoplastic immunotherapy: Secondary | ICD-10-CM | POA: Diagnosis not present

## 2023-06-25 LAB — CMP (CANCER CENTER ONLY)
ALT: 44 U/L (ref 0–44)
AST: 28 U/L (ref 15–41)
Albumin: 4.4 g/dL (ref 3.5–5.0)
Alkaline Phosphatase: 56 U/L (ref 38–126)
Anion gap: 6 (ref 5–15)
BUN: 15 mg/dL (ref 8–23)
CO2: 29 mmol/L (ref 22–32)
Calcium: 8.9 mg/dL (ref 8.9–10.3)
Chloride: 107 mmol/L (ref 98–111)
Creatinine: 1.28 mg/dL — ABNORMAL HIGH (ref 0.61–1.24)
GFR, Estimated: >60 mL/min (ref >60)
Glucose, Bld: 97 mg/dL (ref 70–99)
Potassium: 4.1 mmol/L (ref 3.5–5.1)
Sodium: 142 mmol/L (ref 135–145)
Total Bilirubin: 0.5 mg/dL (ref 0.0–1.2)
Total Protein: 6.9 g/dL (ref 6.5–8.1)

## 2023-06-25 LAB — CBC WITH DIFFERENTIAL (CANCER CENTER ONLY)
Abs Immature Granulocytes: 0.03 10*3/uL (ref 0.00–0.07)
Basophils Absolute: 0 10*3/uL (ref 0.0–0.1)
Basophils Relative: 1 %
Eosinophils Absolute: 0.2 10*3/uL (ref 0.0–0.5)
Eosinophils Relative: 2 %
HCT: 43.4 % (ref 39.0–52.0)
Hemoglobin: 14.3 g/dL (ref 13.0–17.0)
Immature Granulocytes: 0 %
Lymphocytes Relative: 30 %
Lymphs Abs: 2.6 10*3/uL (ref 0.7–4.0)
MCH: 30.2 pg (ref 26.0–34.0)
MCHC: 32.9 g/dL (ref 30.0–36.0)
MCV: 91.6 fL (ref 80.0–100.0)
Monocytes Absolute: 0.7 10*3/uL (ref 0.1–1.0)
Monocytes Relative: 8 %
Neutro Abs: 5.1 10*3/uL (ref 1.7–7.7)
Neutrophils Relative %: 59 %
Platelet Count: 206 10*3/uL (ref 150–400)
RBC: 4.74 MIL/uL (ref 4.22–5.81)
RDW: 15.1 % (ref 11.5–15.5)
WBC Count: 8.6 10*3/uL (ref 4.0–10.5)
nRBC: 0 % (ref 0.0–0.2)

## 2023-06-25 MED ORDER — SODIUM CHLORIDE 0.9 % IV SOLN
200.0000 mg | Freq: Once | INTRAVENOUS | Status: AC
  Administered 2023-06-25: 200 mg via INTRAVENOUS
  Filled 2023-06-25: qty 200

## 2023-06-25 MED ORDER — SODIUM CHLORIDE 0.9 % IV SOLN: Freq: Once | INTRAVENOUS | Status: AC

## 2023-06-25 NOTE — Progress Notes (Signed)
 [facility] [phone]   [phone]  OFFICE PROGRESS NOTE  [name], [name], [place] 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    PRIOR THERAPY: 1) status post robotic assisted laparoscopic left radical nephrectomy by Dr. Liliane Shi on January 05, 2020.  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  The final pathological staging was T3aN0 grade 2 tumor.     2) Status post radiation therapy to the pulmonary nodules completed March 2023.  He received 50 Gray in 5 fractions.   3) Status post cryoablation to 2 lesions in the right kidney completed on Aug 23, 2021.   4) status post endoscopic transsphenoidal pituitary resection completed by Dr. Annalee Genta on November 08, 2021.  The final pathology showed clear-cell renal cell carcinoma.   5) SRS treatment to pituitary bed completed in October 2023.  He received 5 fractions for a total of 25 Gray.  CURRENT THERAPY: Keytruda 200 Mg IV every 3 weeks started Aug 04, 2021 status post 27 cycles with axitinib 5 mg p.o. daily started February 09, 2022.  INTERVAL HISTORY: Marc [name] 69 y.o. male returns to the clinic today for follow-up visit.  Discussed the use of AI scribe software for clinical note transcription with the patient, who gave verbal consent to proceed.  History of Present Illness   Marc [name] "Marc [name]" is a 68 year old male with stage four clear cell renal cell carcinoma who presents for evaluation before starting cycle number 28 of his treatment.  He has stage four clear cell renal cell carcinoma, initially diagnosed in 2021, which progressed to stage four with pulmonary and pituitary metastasis in 2023. He is currently undergoing treatment with  Keytruda, 200 mg IV every three weeks, and axitinib, 5 mg PO daily.  Since the last visit three weeks ago, he has been doing 'pretty good' with no new adverse effects or side effects from the treatment. No nausea or vomiting, but he continues to experience diarrhea, which occurs in clusters two to three times in the evening, managed with Imodium. The frequency of diarrhea seems to depend on his dietary intake during the day.  His last CT scan was performed on April 16, 2023, approximately three months ago.       MEDICAL HISTORY: Past Medical History:  Diagnosis Date   Arthritis    Cancer (HCC)    Kidney, Lung, Pituitary   Hyperlipidemia    Hypothyroidism     ALLERGIES:  has no known allergies.  MEDICATIONS:  Current Outpatient Medications  Medication Sig Dispense Refill   Ascorbic Acid (VITAMIN C PO) Take 1 tablet by mouth every evening.     aspirin EC 81 MG tablet Take 81 mg by mouth every evening. Swallow whole.     chlorhexidine (PERIDEX) 0.12 % solution SMARTSIG:By Mouth     cholecalciferol (VITAMIN D3) 25 MCG (1000 UNIT) tablet Take 1,000 Units by mouth every evening.     Ferrous Sulfate (IRON SLOW RELEASE PO) Take 1 tablet by mouth every evening.     HYDROcodone-acetaminophen (NORCO) 10-325 MG tablet Take by mouth.     ibuprofen (ADVIL) 200 MG tablet Take 400-600 mg by mouth every 6 (six) hours as needed for moderate pain.  INLYTA 5 MG tablet TAKE 1 TABLET BY MOUTH DAILY 30 tablet 3   levothyroxine (SYNTHROID) 88 MCG tablet Take 1 tablet (88 mcg total) by mouth daily. 90 tablet 3   Multiple Vitamin (MULTI VITAMIN) TABS 1 tablet Orally Once a day for 30 day(s)     Multiple Vitamins-Minerals (MULTIVITAMIN WITH MINERALS) tablet Take 1 tablet by mouth every evening.     Omega-3 Fatty Acids (FISH OIL PO) Take 1 capsule by mouth every evening.     ondansetron (ZOFRAN-ODT) 8 MG disintegrating tablet Take 8 mg by mouth daily.     predniSONE (DELTASONE) 5 MG tablet TAKE 1 AND  1/2 TABLETS(7.5 MG) BY MOUTH DAILY WITH BREAKFAST. MAY DOUBLE UP DURING SICK DAYS 150 tablet 1   prochlorperazine (COMPAZINE) 10 MG tablet Take 1 tablet (10 mg total) by mouth every 6 (six) hours as needed for nausea or vomiting. 30 tablet 0   rosuvastatin (CRESTOR) 20 MG tablet TAKE 1 TABLET(20 MG) BY MOUTH DAILY 90 tablet 1   Testosterone (ANDROGEL) 20.25 MG/1.25GM (1.62%) GEL Place 3 Pump onto the skin as directed. 150 g 5   Testosterone 1.62 % GEL SMARTSIG:81 Milligram(s) Topical Every Morning     vitamin E 180 MG (400 UNITS) capsule Take 400 Units by mouth every evening.     No current facility-administered medications for this visit.    SURGICAL HISTORY:  Past Surgical History:  Procedure Laterality Date   CRANIOTOMY N/A 11/08/2021   Procedure: ENDOSCOPIC ENDONASAL RESECTION OF SELLAR MASS;  Surgeon: Jadene Pierini, MD;  Location: [facility] OR;  Service: Neurosurgery;  Laterality: N/A;  RM 20   IR RADIOLOGIST EVAL & MGMT  07/14/2021   IR RADIOLOGIST EVAL & MGMT  07/27/2021   IR RADIOLOGIST EVAL & MGMT  01/30/2022   IR RADIOLOGIST EVAL & MGMT  05/08/2023   RADIOFREQUENCY ABLATION N/A 08/23/2021   Procedure: RENAL CRYO ABLATION;  Surgeon: Berdine Dance, MD;  Location: [facility] ORS;  Service: Anesthesiology;  Laterality: N/A;   right shoulder rotator cuff repair Right    ROBOT ASSISTED LAPAROSCOPIC NEPHRECTOMY Left 01/05/2020   Procedure: XI ROBOTIC ASSISTED LAPAROSCOPIC RADICAL NEPHRECTOMY;  Surgeon: Rene Paci, MD;  Location: [facility];  Service: Urology;  Laterality: Left;   TOTAL HIP ARTHROPLASTY Left 03/31/2020   Procedure: LEFT TOTAL HIP ARTHROPLASTY ANTERIOR APPROACH;  Surgeon: Kathryne Hitch, MD;  Location: [facility] OR;  Service: Orthopedics;  Laterality: Left;   TRANSPHENOIDAL APPROACH EXPOSURE N/A 11/08/2021   Procedure: TRANSPHENOIDAL APPROACH EXPOSURE;  Surgeon: Osborn Coho, MD;  Location: [facility];  Service: ENT;  Laterality: N/A;    REVIEW OF SYSTEMS:  Constitutional:  negative Eyes: negative Ears, nose, mouth, throat, and face: negative Respiratory: negative Cardiovascular: negative Gastrointestinal: positive for diarrhea Genitourinary:negative Integument/breast: negative Hematologic/lymphatic: negative Musculoskeletal:negative Neurological: negative Behavioral/Psych: negative Endocrine: negative Allergic/Immunologic: negative   PHYSICAL EXAMINATION: General appearance: alert, cooperative, and no distress Head: Normocephalic, without obvious abnormality, atraumatic Neck: no adenopathy, no JVD, supple, symmetrical, trachea midline, and thyroid not enlarged, symmetric, no tenderness/mass/nodules Lymph nodes: Cervical, supraclavicular, and axillary nodes normal. Resp: clear to auscultation bilaterally Back: symmetric, no curvature. ROM normal. No CVA tenderness. Cardio: regular rate and rhythm, S1, S2 normal, no murmur, click, rub or gallop GI: soft, non-tender; bowel sounds normal; no masses,  no organomegaly Extremities: extremities normal, atraumatic, no cyanosis or edema Neurologic: Alert and oriented X 3, normal strength and tone. Normal symmetric reflexes. Normal coordination and gait  ECOG PERFORMANCE STATUS: 1 - Symptomatic but completely ambulatory  Blood pressure 116/76, pulse 65, temperature 97.8 F (36.6 C), temperature source Temporal, resp. rate 17, weight 226 lb (102.5 kg), SpO2 100%.  LABORATORY DATA: Lab Results  Component Value Date   WBC 8.6 06/25/2023   HGB 14.3 06/25/2023   HCT 43.4 06/25/2023   MCV 91.6 06/25/2023   PLT 206 06/25/2023      Chemistry      Component Value Date/Time   NA 139 06/03/2023 1101   NA 140 08/09/2022 1014   K 4.3 06/03/2023 1101   CL 103 06/03/2023 1101   CO2 29 06/03/2023 1101   BUN 20 06/03/2023 1101   BUN 18 08/09/2022 1014   CREATININE 1.29 (H) 06/03/2023 1101      Component Value Date/Time   CALCIUM 9.4 06/03/2023 1101   ALKPHOS 55 06/03/2023 1101   AST 31 06/03/2023 1101    ALT 41 06/03/2023 1101   BILITOT 0.5 06/03/2023 1101       RADIOGRAPHIC STUDIES: MR BRAIN W WO CONTRAST Result Date: 05/30/2023 CLINICAL DATA:  Brain/CNS neoplasm, assess treatment response. History of renal cell carcinoma with pituitary metastasis status post debulking and SRS in 2023. EXAM: MRI HEAD WITHOUT AND WITH CONTRAST TECHNIQUE: Multiplanar, multiecho pulse sequences of the brain and surrounding structures were obtained without and with intravenous contrast. CONTRAST:  10 mL Vueway COMPARISON:  Head MRI 01/30/2023 FINDINGS: Brain: Sequelae of trans-sphenoidal tumor resection are again identified. Residual enhancing soft tissue eccentric Leigh located in the right aspect of the sella has not significantly changed. Enhancing infundibular thickening is also unchanged. No new enhancing intracranial lesion, acute infarct, intracranial hemorrhage, midline shift, or extra-axial fluid collection is identified. Scattered small T2 hyperintensities in the cerebral white matter and pons are unchanged and nonspecific but compatible with mild chronic small vessel ischemic disease. There is mild cerebral atrophy. Vascular: Major intracranial vascular flow voids are preserved. Skull and upper cervical spine: No suspicious marrow lesion. Sinuses/Orbits: Unremarkable orbits. Minimal mucosal thickening in the paranasal sinuses. Small left mastoid effusion. Other: Asymmetric fatty atrophy of the right parotid gland. IMPRESSION: 1. Unchanged residual enhancing tissue in the right aspect of the sella. 2. No evidence of new intracranial metastases. Electronically Signed   By: Sebastian Ache M.D.   On: 05/30/2023 11:25    ASSESSMENT AND PLAN: This is a very pleasant 68 years old white male diagnosed with stage IV clear-cell renal cell carcinoma with pulmonary involvement as well as pituitary metastasis in 2023.  The patient was initially diagnosed as stage III and October 2021 status post robotic assisted laparoscopic  left radical nephrectomy followed by radiation therapy to the pulmonary nodules in March 2023 followed by cryoablation to 2 lesions in the right kidney completed in May 2023.  He is also status post endoscopic transsphenoidal pituitary resection on November 08, 2021.  He is also status post SRS to the pituitary bed in October 2023. The patient is currently undergoing treatment with Keytruda 200 Mg IV every 3 weeks status post 22 cycles.  This was started on Aug 04, 2021.  He is also on axitinib 5 mg p.o. daily since November 2023.   He has been tolerating this treatment fairly well recently except for the few episodes of diarrhea improved with Imodium.    Stage IV clear cell renal cell carcinoma with pulmonary and pituitary metastasis Stage IV clear cell renal cell carcinoma with metastasis to the lungs and pituitary gland, diagnosed in 2023. Currently on cycle 28 of pembrolizumab and axitinib, with manageable side effects,  primarily diarrhea controlled with loperamide. The cancer is well-managed with no progression on recent imaging. He has surpassed the median survival time of two to three years, indicating a favorable response. The plan is to continue pembrolizumab for up to two years and axitinib as long as effective, with regular imaging to monitor progression. - Administer cycle 28 of pembrolizumab 200 mg IV. - Continue axitinib 5 mg PO daily. - Order CT scan 10-14 days prior to the next appointment to assess disease status. - Schedule follow-up appointment in 3 weeks.  Diarrhea secondary to cancer treatment Diarrhea occurs in clusters in the evening, likely related to dietary intake, as a side effect of cancer treatment. Managed effectively with loperamide. - Continue loperamide as needed for diarrhea.  Goals of Care Discussion on prognosis and expectations for stage IV renal cell carcinoma. He has surpassed median survival and is responding well to treatment. The disease is not curable, but  treatment aims to control it and prolong life. He is informed about potential progression and outcomes based on metastasis location, with survival statistically expected to be two to three more years, though some live longer. He remains optimistic about treatment effectiveness. - Continue current treatment regimen as long as effective.   The patient was advised to call immediately if he has any concerning symptoms in the interval. The patient voices understanding of current disease status and treatment options and is in agreement with the current care plan.  All questions were answered. The patient knows to call the clinic with any problems, questions or concerns. We can certainly see the patient much sooner if necessary.  The total time spent in the appointment was 30 minutes.  Disclaimer: This note was dictated with voice recognition software. Similar sounding words can inadvertently be transcribed and may not be corrected upon review.

## 2023-06-25 NOTE — Patient Instructions (Signed)

## 2023-06-26 ENCOUNTER — Ambulatory Visit (INDEPENDENT_AMBULATORY_CARE_PROVIDER_SITE_OTHER): Payer: Medicare Other | Admitting: Internal Medicine

## 2023-06-26 ENCOUNTER — Encounter: Payer: Self-pay | Admitting: Internal Medicine

## 2023-06-26 VITALS — BP 122/84 | HR 71 | Ht 69.0 in | Wt 227.0 lb

## 2023-06-26 DIAGNOSIS — E893 Postprocedural hypopituitarism: Secondary | ICD-10-CM

## 2023-06-26 DIAGNOSIS — E2749 Other adrenocortical insufficiency: Secondary | ICD-10-CM | POA: Diagnosis not present

## 2023-06-26 DIAGNOSIS — E291 Testicular hypofunction: Secondary | ICD-10-CM | POA: Diagnosis not present

## 2023-06-26 DIAGNOSIS — E038 Other specified hypothyroidism: Secondary | ICD-10-CM

## 2023-06-26 MED ORDER — LEVOTHYROXINE SODIUM 88 MCG PO TABS: 88.0000 ug | ORAL_TABLET | Freq: Every day | ORAL | 3 refills | Status: AC

## 2023-06-26 MED ORDER — PREDNISONE 5 MG PO TABS: 7.5000 mg | ORAL_TABLET | Freq: Every day | ORAL | 3 refills | Status: DC

## 2023-06-26 NOTE — Patient Instructions (Addendum)
 Continue  Prednisone 5 mg, 1.5 tablets with breakfast daily       ADRENAL INSUFFICIENCY SICK DAY RULES:  Should you face an extreme emotional or physical stress such as trauma, surgery or acute illness, this will require extra steroid coverage so that the body can meet that stress.   Without increasing the steroid dose you may experience severe weakness, headache, dizziness, nausea and vomiting and possibly a more serious deterioration in health.  Typically the dose of steroids will only need to be increased for a couple of days if you have an illness that is transient and managed in the community.   If you are unable to take/absorb an increased dose of steroids orally because of vomiting or diarrhea, you will urgently require steroid injections and should present to an Emergency Department.  The general advice for any serious illness is as follows: Double the normal daily steroid dose for up to 3 days if you have a temperature of more than 37.50C (99.61F) with signs of sickness, or severe emotional or physical distress Contact your primary care doctor and Endocrinologist if the illness worsens or it lasts for more than 3 days.  In cases of severe illness, urgent medical assistance should be promptly sought. If you experience vomiting/diarrhea or are unable to take steroids by mouth, please administer the Hydrocortisone injection kit and seek urgent medical help.

## 2023-06-26 NOTE — Progress Notes (Unsigned)
 Name: Marc [name]  MRN/ DOB: [id number], Apr 25, 1955    Age/ Sex: 68 y.o., male    PCP: Marc Height, PA   Reason for Endocrinology Evaluation: Low testosterone      Date of Initial Endocrinology Evaluation: 06/19/2021    HPI: [name] is a 68 y.o. 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 The patient presented for initial endocrinology clinic visit on 06/19/2021 for consultative assistance with his Low testosterone .   Marc [name] was referred her for further evaluation of low testosterone and LH this was during work up for low libido. He denies erectile dysfunction but has noted decrease in spontaneous     Of note, he is S/P left nephrectomy due to RCC with Mets to the lungs  As/P cryoablation of 2 lesions in the right kidney completed 07/2021 Completed radiation therapy to pulmonary nodules 05/2021    He has never been on testosterone prior to his presentation to our clinic Had COVID in 01/2021 followed by long COVID, he felt terrible until he went on prednisone  which has helped   1/5th he saw ortho for leg weakness, PT worsened his condition . Steroid improved  his condition after taking them for  5 days , after that he noted decrease appetite, lost 40 lbs and feeling poorly again. Saw PCP 2/20th started on Prednisone 40 mg for 5 days.  He received another refill 3/3rd and has been taking 10 mg daily    He has no prostate cancer   No CAD  No FH of prostate cancer  He is an ex-smoke    He was started on HC with undetectable ACTH < 5 pg/mL and low serum cortisol 0.2ug/dL 06/07/6576  He was started on LT-4 replacement with a FT4 at 0.36 ng/dL , TSH inappropriately normal 06/20/2021   His testosterone was low at 5 ng/dL( [phone]) He was started on Natesto nasal spray, this was cleared by his oncologist as the benefit outweighed the risk     He is s/p radiation therapy to the pulmonary nodules  05/2021 He is s/p cryoablation to 2 lesions in the right kidney 07/2021  He is s/p uncomplicated endoscopic endonasal resection of the sellar tumor by Dr. Maurice Small on 11/08/2021, pathology report consistent with clear cell carcinoma  as well as SRS treatment to the pituitary bed 12/2021, he received 5 fractions of a total of 25 Gray  SUBJECTIVE:   Today (06/26/23): Marc [name] is here for follow-up on hypopituitarism.  Patient continues to follow-up with oncology for history of clear-cell renal carcinoma.  He is s/p radiation therapy to pulmonary nodules in 2023, as well as cryoablation to 2 lesions in the right kidney 2023.   He is s/p endoscopic transsphenoidal pituitary resection 10/2021 with metastatic renal cell carcinoma. Marland Kitchen  He is s/p SRS treatment to pituitary bed 12/2021  He was started on Keytruda on 12/05/2021, and continues to receive infusions He is also on axitinib oral daily  The patient was advised through oncology to take Imodium for diarrhea    Denies headaches or vision changes  Denies nausea or vomiting  Has occasional dizziness when walking  He follows the sick days rule  Denies ED   HOME ENDOCRINE MEDICATIONS: Prednisone 5 mg, , 1.5 tabs every morning Levothyroxine 88 mcg daily AndroGel 3 pumps daily       HISTORY:  Past Medical History:  Past Medical History:  Diagnosis Date   Arthritis    Cancer (HCC)    Kidney, Lung, Pituitary   Hyperlipidemia    Hypothyroidism    Past Surgical History:  Past Surgical History:  Procedure Laterality Date   CRANIOTOMY N/A 11/08/2021   Procedure: ENDOSCOPIC ENDONASAL RESECTION OF SELLAR MASS;  Surgeon: Jadene Pierini, MD;  Location: [facility];  Service: Neurosurgery;  Laterality: N/A;  RM 20   IR RADIOLOGIST EVAL & MGMT  07/14/2021   IR RADIOLOGIST EVAL & MGMT  07/27/2021   IR RADIOLOGIST EVAL & MGMT  01/30/2022   IR RADIOLOGIST EVAL & MGMT  05/08/2023   RADIOFREQUENCY ABLATION N/A 08/23/2021   Procedure: RENAL CRYO  ABLATION;  Surgeon: Berdine Dance, MD;  Location: [facility];  Service: Anesthesiology;  Laterality: N/A;   right shoulder rotator cuff repair Right    ROBOT ASSISTED LAPAROSCOPIC NEPHRECTOMY Left 01/05/2020   Procedure: XI ROBOTIC ASSISTED LAPAROSCOPIC RADICAL NEPHRECTOMY;  Surgeon: Rene Paci, MD;  Location: [facility];  Service: Urology;  Laterality: Left;   TOTAL HIP ARTHROPLASTY Left 03/31/2020   Procedure: LEFT TOTAL HIP ARTHROPLASTY ANTERIOR APPROACH;  Surgeon: Kathryne Hitch, MD;  Location: [facility] OR;  Service: Orthopedics;  Laterality: Left;   TRANSPHENOIDAL APPROACH EXPOSURE N/A 11/08/2021   Procedure: TRANSPHENOIDAL APPROACH EXPOSURE;  Surgeon: Osborn Coho, MD;  Location: [facility];  Service: ENT;  Laterality: N/A;    Social History:  reports that he quit smoking about 8 years ago. His smoking use included cigarettes. He started smoking about 38 years ago. He has never used smokeless tobacco. He reports current alcohol use. He reports that he does not use drugs. Family History: family history is not on file.   HOME MEDICATIONS: Allergies as of 06/26/2023   No Known Allergies      Medication List        Accurate as of June 26, 2023 10:49 AM. If you have any questions, ask your nurse or doctor.          aspirin EC 81 MG tablet Take 81 mg by mouth every evening. Swallow whole.   chlorhexidine 0.12 % solution Commonly known as: PERIDEX SMARTSIG:By Mouth   cholecalciferol 25 MCG (1000 UNIT) tablet Commonly known as: VITAMIN D3 Take 1,000 Units by mouth every evening.   FISH OIL PO Take 1 capsule by mouth every evening.   HYDROcodone-acetaminophen 10-325 MG tablet Commonly known as: NORCO Take by mouth.   ibuprofen 200 MG tablet Commonly known as: ADVIL Take 400-600 mg by mouth every 6 (six) hours as needed for moderate pain.   Inlyta 5 MG tablet Generic drug: axitinib TAKE 1 TABLET BY MOUTH DAILY   IRON SLOW RELEASE PO Take 1 tablet by mouth  every evening.   levothyroxine 88 MCG tablet Commonly known as: SYNTHROID Take 1 tablet (88 mcg total) by mouth daily.   Multi Vitamin Tabs 1 tablet Orally Once a day for 30 day(s)   multivitamin with minerals tablet Take 1 tablet by mouth every evening.   ondansetron 8 MG disintegrating tablet Commonly known as: ZOFRAN-ODT Take 8 mg by mouth daily.   predniSONE 5 MG tablet Commonly known as: DELTASONE TAKE 1 AND 1/2 TABLETS(7.5 MG) BY MOUTH DAILY WITH BREAKFAST. MAY DOUBLE UP DURING SICK DAYS   prochlorperazine 10 MG tablet Commonly known as: COMPAZINE Take 1 tablet (10 mg total) by mouth every 6 (six) hours as needed for nausea or vomiting.   rosuvastatin 20 MG tablet Commonly known as: CRESTOR TAKE 1 TABLET(20 MG)  BY MOUTH DAILY   SM Slow Release Iron 143 (45 Fe) MG Tbcr Generic drug: Ferrous Sulfate Dried as directed Orally   Testosterone 1.62 % Gel SMARTSIG:81 Milligram(s) Topical Every Morning   Testosterone 20.25 MG/1.25GM (1.62%) Gel Commonly known as: AndroGel Place 3 Pump onto the skin as directed.   VITAMIN C PO Take 1 tablet by mouth every evening.   vitamin E 180 MG (400 UNITS) capsule Take 400 Units by mouth every evening.          REVIEW OF SYSTEMS: A comprehensive ROS was conducted with the patient and is negative except as per HPI    OBJECTIVE:  VS: BP 122/84 (BP Location: Left Arm, Patient Position: Sitting, Cuff Size: Normal)   Pulse 71   Ht 5\' 9"  (1.753 m)   Wt 227 lb (103 kg)   SpO2 97%   BMI 33.52 kg/m    Wt Readings from Last 3 Encounters:  06/26/23 227 lb (103 kg)  06/25/23 226 lb (102.5 kg)  06/03/23 225 lb 11.2 oz (102.4 kg)   Body surface area is 2.24 meters squared.   EXAM: General: Pt appears well and is in NAD  Neck: General: Supple without adenopathy. Thyroid: Thyroid size normal.  No goiter or nodules appreciated.   Lungs: Clear with good BS bilat   Heart: Auscultation: RRR.  Abdomen:  soft, nontender   Extremities:  BL LE: Tace  pretibial edema  Mental Status: Judgment, insight: Intact Orientation: Oriented to time, place, and person Mood and affect: No depression, anxiety, or agitation     DATA REVIEWED:   Latest Reference Range & Units 06/25/23 10:34  Sodium 135 - 145 mmol/L 142  Potassium 3.5 - 5.1 mmol/L 4.1  Chloride 98 - 111 mmol/L 107  CO2 22 - 32 mmol/L 29  Glucose 70 - 99 mg/dL 97  BUN 8 - 23 mg/dL 15  Creatinine 1.61 - 0.96 mg/dL 0.45 (H)  Calcium 8.9 - 10.3 mg/dL 8.9  Anion gap 5 - 15  6  Alkaline Phosphatase 38 - 126 U/L 56  Albumin 3.5 - 5.0 g/dL 4.4  AST 15 - 41 U/L 28  ALT 0 - 44 U/L 44  Total Protein 6.5 - 8.1 g/dL 6.9  Total Bilirubin 0.0 - 1.2 mg/dL 0.5  GFR, Est Non African American >60 mL/min >60    Latest Reference Range & Units 06/03/23 11:01  TSH 0.350 - 4.500 uIU/mL <0.010 (L)  Thyroxine (T4) 4.5 - 12.0 ug/dL 6.0  (L): Data is abnormally low     Latest Reference Range & Units 12/26/22 11:38  Testosterone, Total, LC-MS-MS 250 - 1,100 ng/dL 409     MRI Brain 11/10/9145   FINDINGS: Brain: Sequelae of trans-sphenoidal tumor resection are again identified. Residual enhancing soft tissue eccentric Leigh located in the right aspect of the sella has not significantly changed. Enhancing infundibular thickening is also unchanged.   No new enhancing intracranial lesion, acute infarct, intracranial hemorrhage, midline shift, or extra-axial fluid collection is identified. Scattered small T2 hyperintensities in the cerebral white matter and pons are unchanged and nonspecific but compatible with mild chronic small vessel ischemic disease. There is mild cerebral atrophy.   Vascular: Major intracranial vascular flow voids are preserved.   Skull and upper cervical spine: No suspicious marrow lesion.   Sinuses/Orbits: Unremarkable orbits. Minimal mucosal thickening in the paranasal sinuses. Small left mastoid effusion.   Other: Asymmetric fatty  atrophy of the right parotid gland.   IMPRESSION: 1. Unchanged residual enhancing tissue  in the right aspect of the sella. 2. No evidence of new intracranial metastases.         ASSESSMENT/PLAN/RECOMMENDATIONS:   Hx of Pituitary macroadenoma, S/P transphenoidal hypophysectomy 10/2021   -This was found on brain MRI 05/2021 - S/P endoscopic endonasal pituitary resection 10/2021, pathology report consistent with renal cell carcinoma -S/p SRS of the pituitary bed - Started on Keytruda through Oncology 9/5, 2023 -Repeat MRI 05/2023 showed stable  size of the residual enhancing tissue along the right aspect of the sella  2.  Secondary adrenal insufficiency:  -We had switched to prednisone 06/2022 -Sick day rules discussed with the patient -Patient encouraged to obtain a medical alert bracelet   Medication Prednisone 5 mg, 1.5 tablets daily   3. Secondary hypogonadism   -No clinical symptoms of hypogonadism -Testosterone pending  Continue  Androgel 3 pumps daily   4.  Secondary hypothyroidism:   -Patient is clinically euthyroid -He recently had a normal total T4 through his oncologist office -No changes   Medication  Continue levothyroxine 88 mcg daily    Follow-up in 1 yr       Signed electronically by: Lyndle Herrlich, MD  [facility]  [facility] [address] [place] [place], [place] [address] Phone: [phone] FAX: [phone]   CC: Marc Height, PA [address] [place] [address] Phone: [phone] Fax: [phone]   Return to Endocrinology clinic as below: Future Appointments  Date Time Provider Department Center  07/08/2023 10:30 AM [facility] 1 [facility] [place]  07/16/2023 10:30 AM [facility] LAB [facility] None  07/16/2023 11:00 AM Si Gaul, MD [facility] None  07/16/2023 11:45 AM [facility] INFUSION [facility] None  08/06/2023  8:00 AM [facility] LAB [facility] None   08/06/2023  8:30 AM Heilingoetter, Cassandra L, PA-C [facility] None  08/06/2023  9:15 AM [facility] INFUSION [facility] None  08/28/2023  8:15 AM [facility] LAB [facility] None  08/28/2023  8:45 AM Si Gaul, MD [facility] None  08/28/2023  9:45 AM [facility] INFUSION [facility] None  09/18/2023  8:00 AM [facility] LAB [facility] None  09/18/2023  8:30 AM Si Gaul, MD [facility] None  09/18/2023  9:30 AM [facility] INFUSION [facility] None  10/03/2023 10:20 AM [facility] MR 3 [facility]-315MRI [facility] [address]  10/07/2023  7:00 AM [facility] BO[facility] CONFERENCE [facility] None  10/07/2023 10:30 AM Vaslow, Georgeanna Lea, MD [facility] None

## 2023-06-27 ENCOUNTER — Other Ambulatory Visit: Payer: Self-pay

## 2023-06-30 LAB — TESTOSTERONE, TOTAL, LC/MS/MS: Testosterone, Total, LC-MS-MS: 364 ng/dL (ref 250–1100)

## 2023-07-02 ENCOUNTER — Encounter: Payer: Self-pay | Admitting: Internal Medicine

## 2023-07-08 ENCOUNTER — Ambulatory Visit (HOSPITAL_COMMUNITY)
Admission: RE | Admit: 2023-07-08 | Discharge: 2023-07-08 | Disposition: A | Discharge status: Home / Self Care | Source: Ambulatory Visit | Attending: Internal Medicine | Admitting: Internal Medicine

## 2023-07-08 DIAGNOSIS — C642 Malignant neoplasm of left kidney, except renal pelvis: Secondary | ICD-10-CM | POA: Diagnosis present

## 2023-07-16 ENCOUNTER — Inpatient Hospital Stay: Payer: Medicare Other | Attending: Oncology

## 2023-07-16 ENCOUNTER — Inpatient Hospital Stay (HOSPITAL_BASED_OUTPATIENT_CLINIC_OR_DEPARTMENT_OTHER): Payer: Medicare Other | Admitting: Internal Medicine

## 2023-07-16 ENCOUNTER — Inpatient Hospital Stay: Payer: Medicare Other

## 2023-07-16 VITALS — BP 133/83 | HR 65 | Temp 98.0°F | Resp 17 | Ht 69.0 in | Wt 227.3 lb

## 2023-07-16 DIAGNOSIS — C7801 Secondary malignant neoplasm of right lung: Secondary | ICD-10-CM | POA: Diagnosis not present

## 2023-07-16 DIAGNOSIS — Z5112 Encounter for antineoplastic immunotherapy: Secondary | ICD-10-CM | POA: Insufficient documentation

## 2023-07-16 DIAGNOSIS — C649 Malignant neoplasm of unspecified kidney, except renal pelvis: Secondary | ICD-10-CM | POA: Diagnosis not present

## 2023-07-16 DIAGNOSIS — R197 Diarrhea, unspecified: Secondary | ICD-10-CM | POA: Diagnosis not present

## 2023-07-16 DIAGNOSIS — C7800 Secondary malignant neoplasm of unspecified lung: Secondary | ICD-10-CM | POA: Diagnosis not present

## 2023-07-16 DIAGNOSIS — C642 Malignant neoplasm of left kidney, except renal pelvis: Secondary | ICD-10-CM

## 2023-07-16 DIAGNOSIS — R42 Dizziness and giddiness: Secondary | ICD-10-CM | POA: Diagnosis not present

## 2023-07-16 DIAGNOSIS — C641 Malignant neoplasm of right kidney, except renal pelvis: Secondary | ICD-10-CM | POA: Diagnosis present

## 2023-07-16 LAB — CBC WITH DIFFERENTIAL (CANCER CENTER ONLY)
Abs Immature Granulocytes: 0.09 10*3/uL — ABNORMAL HIGH (ref 0.00–0.07)
Basophils Absolute: 0.1 10*3/uL (ref 0.0–0.1)
Basophils Relative: 1 %
Eosinophils Absolute: 0.1 10*3/uL (ref 0.0–0.5)
Eosinophils Relative: 1 %
HCT: 43.4 % (ref 39.0–52.0)
Hemoglobin: 14.5 g/dL (ref 13.0–17.0)
Immature Granulocytes: 1 %
Lymphocytes Relative: 21 %
Lymphs Abs: 2.2 10*3/uL (ref 0.7–4.0)
MCH: 30.3 pg (ref 26.0–34.0)
MCHC: 33.4 g/dL (ref 30.0–36.0)
MCV: 90.8 fL (ref 80.0–100.0)
Monocytes Absolute: 0.8 10*3/uL (ref 0.1–1.0)
Monocytes Relative: 7 %
Neutro Abs: 7.4 10*3/uL (ref 1.7–7.7)
Neutrophils Relative %: 69 %
Platelet Count: 225 10*3/uL (ref 150–400)
RBC: 4.78 MIL/uL (ref 4.22–5.81)
RDW: 14.7 % (ref 11.5–15.5)
WBC Count: 10.6 10*3/uL — ABNORMAL HIGH (ref 4.0–10.5)
nRBC: 0 % (ref 0.0–0.2)

## 2023-07-16 LAB — CMP (CANCER CENTER ONLY)
ALT: 47 U/L — ABNORMAL HIGH (ref 0–44)
AST: 30 U/L (ref 15–41)
Albumin: 4.4 g/dL (ref 3.5–5.0)
Alkaline Phosphatase: 61 U/L (ref 38–126)
Anion gap: 5 (ref 5–15)
BUN: 20 mg/dL (ref 8–23)
CO2: 31 mmol/L (ref 22–32)
Calcium: 9.3 mg/dL (ref 8.9–10.3)
Chloride: 103 mmol/L (ref 98–111)
Creatinine: 1.4 mg/dL — ABNORMAL HIGH (ref 0.61–1.24)
GFR, Estimated: 56 mL/min — ABNORMAL LOW (ref >60)
Glucose, Bld: 104 mg/dL — ABNORMAL HIGH (ref 70–99)
Potassium: 4.4 mmol/L (ref 3.5–5.1)
Sodium: 139 mmol/L (ref 135–145)
Total Bilirubin: 0.4 mg/dL (ref 0.0–1.2)
Total Protein: 7.3 g/dL (ref 6.5–8.1)

## 2023-07-16 MED ORDER — HEPARIN SOD (PORK) LOCK FLUSH 100 UNIT/ML IV SOLN: 500.0000 [IU] | Freq: Once | INTRAVENOUS | Status: DC | PRN

## 2023-07-16 MED ORDER — SODIUM CHLORIDE 0.9 % IV SOLN: Freq: Once | INTRAVENOUS | Status: AC

## 2023-07-16 MED ORDER — SODIUM CHLORIDE 0.9% FLUSH: 10.0000 mL | INTRAVENOUS | Status: DC | PRN

## 2023-07-16 MED ORDER — SODIUM CHLORIDE 0.9 % IV SOLN
200.0000 mg | Freq: Once | INTRAVENOUS | Status: AC
  Administered 2023-07-16: 200 mg via INTRAVENOUS
  Filled 2023-07-16: qty 200

## 2023-07-16 NOTE — Patient Instructions (Signed)

## 2023-07-16 NOTE — Progress Notes (Signed)
 [facility] [phone]   [phone]  OFFICE PROGRESS NOTE  [name], [name], [place] 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    PRIOR THERAPY: 1) status post robotic assisted laparoscopic left radical nephrectomy by Dr. Liliane Shi on January 05, 2020.  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  The final pathological staging was T3aN0 grade 2 tumor.     2) Status post radiation therapy to the pulmonary nodules completed March 2023.  He received 50 Gray in 5 fractions.   3) Status post cryoablation to 2 lesions in the right kidney completed on Aug 23, 2021.   4) status post endoscopic transsphenoidal pituitary resection completed by Dr. Annalee Genta on November 08, 2021.  The final pathology showed clear-cell renal cell carcinoma.   5) SRS treatment to pituitary bed completed in October 2023.  He received 5 fractions for a total of 25 Gray.  CURRENT THERAPY: Keytruda 200 Mg IV every 3 weeks started Aug 04, 2021 status post 28 cycles with axitinib 5 mg p.o. daily started February 09, 2022.  INTERVAL HISTORY: Marc [name] 68 y.o. male returns to the clinic today for follow-up visit. Discussed the use of AI scribe software for clinical note transcription with the patient, who gave verbal consent to proceed.  History of Present Illness   The patient is a 68 year old with stage four renal cell carcinoma who presents for evaluation with repeat CT scan for restaging of his disease.  He has stage four renal cell carcinoma with pulmonary involvement and pituitary metastasis, diagnosed in 2023. He is undergoing systemic therapy with Keytruda 200 mg IV every three weeks and axitinib 5 mg orally once daily. He has  completed 28 cycles of this treatment regimen and is here for evaluation before starting cycle number 29.  He reports no new issues since his last visit and states he is 'cruising nicely' through the treatment. No pneumonia, vomiting, or diarrhea, although he mentions managing diarrhea with ammonia. He experienced a dizzy spell two days ago before bed, which persisted upon waking but resolved thereafter. He describes the dizziness as 'like a drunk dizzy' and notes it was a one-time occurrence.  A recent CT scan of the chest, abdomen, and pelvis shows stable disease with no new growths or spreading of lesions. The lesions remain present but are not growing.        MEDICAL HISTORY: Past Medical History:  Diagnosis Date   Arthritis    Cancer (HCC)    Kidney, Lung, Pituitary   Hyperlipidemia    Hypothyroidism     ALLERGIES:  has no known allergies.  MEDICATIONS:  Current Outpatient Medications  Medication Sig Dispense Refill   Ascorbic Acid (VITAMIN C PO) Take 1 tablet by mouth every evening.     aspirin EC 81 MG tablet Take 81 mg by mouth every evening. Swallow whole.     chlorhexidine (PERIDEX) 0.12 % solution SMARTSIG:By Mouth     cholecalciferol (VITAMIN D3) 25 MCG (1000 UNIT) tablet Take 1,000 Units by mouth every evening.     Ferrous Sulfate (IRON SLOW RELEASE PO) Take 1 tablet by mouth every evening.     Ferrous Sulfate Dried (SM SLOW RELEASE IRON) 143 (45 Fe) MG TBCR  as directed Orally     HYDROcodone-acetaminophen (NORCO) 10-325 MG tablet Take by mouth.     ibuprofen (ADVIL) 200 MG tablet Take 400-600 mg by mouth every 6 (six) hours as needed for moderate pain.     INLYTA 5 MG tablet TAKE 1 TABLET BY MOUTH DAILY 30 tablet 3   levothyroxine (SYNTHROID) 88 MCG tablet Take 1 tablet (88 mcg total) by mouth daily. 90 tablet 3   Multiple Vitamin (MULTI VITAMIN) TABS 1 tablet Orally Once a day for 30 day(s)     Multiple Vitamins-Minerals (MULTIVITAMIN WITH MINERALS) tablet Take 1  tablet by mouth every evening.     Omega-3 Fatty Acids (FISH OIL PO) Take 1 capsule by mouth every evening.     ondansetron (ZOFRAN-ODT) 8 MG disintegrating tablet Take 8 mg by mouth daily.     predniSONE (DELTASONE) 5 MG tablet Take 1.5 tablets (7.5 mg total) by mouth daily with breakfast. 150 tablet 3   prochlorperazine (COMPAZINE) 10 MG tablet Take 1 tablet (10 mg total) by mouth every 6 (six) hours as needed for nausea or vomiting. 30 tablet 0   rosuvastatin (CRESTOR) 20 MG tablet TAKE 1 TABLET(20 MG) BY MOUTH DAILY 90 tablet 1   Testosterone (ANDROGEL) 20.25 MG/1.25GM (1.62%) GEL Place 3 Pump onto the skin as directed. 150 g 5   Testosterone 1.62 % GEL SMARTSIG:81 Milligram(s) Topical Every Morning     vitamin E 180 MG (400 UNITS) capsule Take 400 Units by mouth every evening.     No current facility-administered medications for this visit.    SURGICAL HISTORY:  Past Surgical History:  Procedure Laterality Date   CRANIOTOMY N/A 11/08/2021   Procedure: ENDOSCOPIC ENDONASAL RESECTION OF SELLAR MASS;  Surgeon: Jadene Pierini, MD;  Location: [facility] OR;  Service: Neurosurgery;  Laterality: N/A;  RM 20   IR RADIOLOGIST EVAL & MGMT  07/14/2021   IR RADIOLOGIST EVAL & MGMT  07/27/2021   IR RADIOLOGIST EVAL & MGMT  01/30/2022   IR RADIOLOGIST EVAL & MGMT  05/08/2023   RADIOFREQUENCY ABLATION N/A 08/23/2021   Procedure: RENAL CRYO ABLATION;  Surgeon: Berdine Dance, MD;  Location: [facility] ORS;  Service: Anesthesiology;  Laterality: N/A;   right shoulder rotator cuff repair Right    ROBOT ASSISTED LAPAROSCOPIC NEPHRECTOMY Left 01/05/2020   Procedure: XI ROBOTIC ASSISTED LAPAROSCOPIC RADICAL NEPHRECTOMY;  Surgeon: Rene Paci, MD;  Location: [facility] ORS;  Service: Urology;  Laterality: Left;   TOTAL HIP ARTHROPLASTY Left 03/31/2020   Procedure: LEFT TOTAL HIP ARTHROPLASTY ANTERIOR APPROACH;  Surgeon: Kathryne Hitch, MD;  Location: [facility] OR;  Service: Orthopedics;  Laterality: Left;    TRANSPHENOIDAL APPROACH EXPOSURE N/A 11/08/2021   Procedure: TRANSPHENOIDAL APPROACH EXPOSURE;  Surgeon: Osborn Coho, MD;  Location: [facility];  Service: ENT;  Laterality: N/A;    REVIEW OF SYSTEMS:  Constitutional: negative Eyes: negative Ears, nose, mouth, throat, and face: negative Respiratory: negative Cardiovascular: negative Gastrointestinal: positive for diarrhea Genitourinary:negative Integument/breast: negative Hematologic/lymphatic: negative Musculoskeletal:negative Neurological: negative Behavioral/Psych: negative Endocrine: negative Allergic/Immunologic: negative   PHYSICAL EXAMINATION: General appearance: alert, cooperative, and no distress Head: Normocephalic, without obvious abnormality, atraumatic Neck: no adenopathy, no JVD, supple, symmetrical, trachea midline, and thyroid not enlarged, symmetric, no tenderness/mass/nodules Lymph nodes: Cervical, supraclavicular, and axillary nodes normal. Resp: clear to auscultation bilaterally Back: symmetric, no curvature. ROM normal. No CVA tenderness. Cardio: regular rate and rhythm, S1, S2 normal, no murmur, click, rub or gallop GI: soft, non-tender; bowel sounds normal; no masses,  no organomegaly  Extremities: extremities normal, atraumatic, no cyanosis or edema Neurologic: Alert and oriented X 3, normal strength and tone. Normal symmetric reflexes. Normal coordination and gait  ECOG PERFORMANCE STATUS: 1 - Symptomatic but completely ambulatory  Blood pressure 133/83, pulse 65, temperature 98 F (36.7 C), temperature source Temporal, resp. rate 17, height 5\' 9"  (1.753 m), weight 227 lb 4.8 oz (103.1 kg), SpO2 98%.  LABORATORY DATA: Lab Results  Component Value Date   WBC 10.6 (H) 07/16/2023   HGB 14.5 07/16/2023   HCT 43.4 07/16/2023   MCV 90.8 07/16/2023   PLT 225 07/16/2023      Chemistry      Component Value Date/Time   NA 139 07/16/2023 1024   NA 140 08/09/2022 1014   K 4.4 07/16/2023 1024   CL 103  07/16/2023 1024   CO2 31 07/16/2023 1024   BUN 20 07/16/2023 1024   BUN 18 08/09/2022 1014   CREATININE 1.40 (H) 07/16/2023 1024      Component Value Date/Time   CALCIUM 9.3 07/16/2023 1024   ALKPHOS 61 07/16/2023 1024   AST 30 07/16/2023 1024   ALT 47 (H) 07/16/2023 1024   BILITOT 0.4 07/16/2023 1024       RADIOGRAPHIC STUDIES: CT Chest Wo Contrast Result Date: 07/16/2023 CLINICAL DATA:  Metastatic renal cell carcinoma. Restaging. * Tracking Code: BO * EXAM: CT CHEST, ABDOMEN AND PELVIS WITHOUT CONTRAST TECHNIQUE: Multidetector CT imaging of the chest, abdomen and pelvis was performed following the standard protocol without IV contrast. RADIATION DOSE REDUCTION: This exam was performed according to the departmental dose-optimization program which includes automated exposure control, adjustment of the mA and/or kV according to patient size and/or use of iterative reconstruction technique. COMPARISON:  04/16/2023 FINDINGS: CT CHEST FINDINGS Cardiovascular: The heart size is normal. No substantial pericardial effusion. Coronary artery calcification is evident. Mild atherosclerotic calcification is noted in the wall of the thoracic aorta. Mediastinum/Nodes: No mediastinal lymphadenopathy. No evidence for gross hilar lymphadenopathy although assessment is limited by the lack of intravenous contrast on the current study. The esophagus has normal imaging features. There is no axillary lymphadenopathy. Lungs/Pleura: Nodular consolidative lesion in the right parahilar lung measures 3.0 x 2.7 cm today on image 66/8, similar to prior of 3.1 x 2.6 cm when remeasured on the previous exam. Similar adjacent architectural distortion. Nodular opacity in the right lower lobe at the dome of the hemidiaphragm is also similar to prior with surrounding areas of architectural distortion/scarring. Confluent nodular component measures 3.0 x 1.6 cm today compared to 3.1 x 1.6 cm previously (remeasured). The  pleural/subpleural nodule in the paraspinal right lower lobe measured previously at 2.7 x 1.6 cm is 2.8 x 1.6 cm today on image 106/8. No new suspicious pulmonary nodule or mass. There is no evidence of pleural effusion. Musculoskeletal: No worrisome lytic or sclerotic osseous abnormality. CT ABDOMEN PELVIS FINDINGS Hepatobiliary: No suspicious focal abnormality in the liver on this study without intravenous contrast. Layering tiny 1-2 mm calcified gallstones noted. No gallbladder wall thickening or pericholecystic fluid. No intrahepatic or extrahepatic biliary dilation. Pancreas: No focal mass lesion. No dilatation of the main duct. No intraparenchymal cyst. No peripancreatic edema. Spleen: No splenomegaly. No suspicious focal mass lesion. Adrenals/Urinary Tract: Left adrenal gland not clearly visualized. Right adrenal gland unremarkable. Status post left nephrectomy. Stable 11 mm focus of soft tissue density with calcification or suture material in the left para-aortic space (31/2) compatible with postsurgical scarring. No new or progressive soft tissue in the left nephrectomy bed.  Right kidney demonstrates cortical scarring in the upper pole but is otherwise unremarkable by noncontrast CT imaging. No right hydroureter. The urinary bladder appears normal for the degree of distention. Stomach/Bowel: Stomach is unremarkable. No gastric wall thickening. No evidence of outlet obstruction. Duodenum is normally positioned as is the ligament of Treitz. No small bowel wall thickening. No small bowel dilatation. The terminal ileum is normal. The appendix is normal. No gross colonic mass. No colonic wall thickening. Diverticular changes are noted in the left colon without evidence of diverticulitis. Vascular/Lymphatic: There is mild atherosclerotic calcification of the abdominal aorta without aneurysm. There is no gastrohepatic or hepatoduodenal ligament lymphadenopathy. No retroperitoneal or mesenteric lymphadenopathy. No  pelvic sidewall lymphadenopathy. Reproductive: The prostate gland and seminal vesicles are unremarkable. Other: No intraperitoneal free fluid. Musculoskeletal: Status post left total hip replacement. No worrisome lytic or sclerotic osseous abnormality. IMPRESSION: 1. Stable exam. No new or progressive findings on the current study. 2. Stable nodular consolidative lesions in the right parahilar lung and right lower lobe at the dome of the hemidiaphragm. The pleural/subpleural nodule in the paraspinal right lower lobe is also unchanged. 3. Left nephrectomy without evidence for features of local recurrence in the nephrectomy bed. 4. Stable scarring upper pole right kidney. 5. Cholelithiasis. 6. Left colonic diverticulosis without diverticulitis. 7.  Aortic Atherosclerois (ICD10-170.0) Electronically Signed   By: Donnal Fusi M.D.   On: 07/16/2023 06:35   CT ABDOMEN PELVIS WO CONTRAST Result Date: 07/16/2023 CLINICAL DATA:  Metastatic renal cell carcinoma. Restaging. * Tracking Code: BO * EXAM: CT CHEST, ABDOMEN AND PELVIS WITHOUT CONTRAST TECHNIQUE: Multidetector CT imaging of the chest, abdomen and pelvis was performed following the standard protocol without IV contrast. RADIATION DOSE REDUCTION: This exam was performed according to the departmental dose-optimization program which includes automated exposure control, adjustment of the mA and/or kV according to patient size and/or use of iterative reconstruction technique. COMPARISON:  04/16/2023 FINDINGS: CT CHEST FINDINGS Cardiovascular: The heart size is normal. No substantial pericardial effusion. Coronary artery calcification is evident. Mild atherosclerotic calcification is noted in the wall of the thoracic aorta. Mediastinum/Nodes: No mediastinal lymphadenopathy. No evidence for gross hilar lymphadenopathy although assessment is limited by the lack of intravenous contrast on the current study. The esophagus has normal imaging features. There is no axillary  lymphadenopathy. Lungs/Pleura: Nodular consolidative lesion in the right parahilar lung measures 3.0 x 2.7 cm today on image 66/8, similar to prior of 3.1 x 2.6 cm when remeasured on the previous exam. Similar adjacent architectural distortion. Nodular opacity in the right lower lobe at the dome of the hemidiaphragm is also similar to prior with surrounding areas of architectural distortion/scarring. Confluent nodular component measures 3.0 x 1.6 cm today compared to 3.1 x 1.6 cm previously (remeasured). The pleural/subpleural nodule in the paraspinal right lower lobe measured previously at 2.7 x 1.6 cm is 2.8 x 1.6 cm today on image 106/8. No new suspicious pulmonary nodule or mass. There is no evidence of pleural effusion. Musculoskeletal: No worrisome lytic or sclerotic osseous abnormality. CT ABDOMEN PELVIS FINDINGS Hepatobiliary: No suspicious focal abnormality in the liver on this study without intravenous contrast. Layering tiny 1-2 mm calcified gallstones noted. No gallbladder wall thickening or pericholecystic fluid. No intrahepatic or extrahepatic biliary dilation. Pancreas: No focal mass lesion. No dilatation of the main duct. No intraparenchymal cyst. No peripancreatic edema. Spleen: No splenomegaly. No suspicious focal mass lesion. Adrenals/Urinary Tract: Left adrenal gland not clearly visualized. Right adrenal gland unremarkable. Status post left nephrectomy. Stable  11 mm focus of soft tissue density with calcification or suture material in the left para-aortic space (31/2) compatible with postsurgical scarring. No new or progressive soft tissue in the left nephrectomy bed. Right kidney demonstrates cortical scarring in the upper pole but is otherwise unremarkable by noncontrast CT imaging. No right hydroureter. The urinary bladder appears normal for the degree of distention. Stomach/Bowel: Stomach is unremarkable. No gastric wall thickening. No evidence of outlet obstruction. Duodenum is normally  positioned as is the ligament of Treitz. No small bowel wall thickening. No small bowel dilatation. The terminal ileum is normal. The appendix is normal. No gross colonic mass. No colonic wall thickening. Diverticular changes are noted in the left colon without evidence of diverticulitis. Vascular/Lymphatic: There is mild atherosclerotic calcification of the abdominal aorta without aneurysm. There is no gastrohepatic or hepatoduodenal ligament lymphadenopathy. No retroperitoneal or mesenteric lymphadenopathy. No pelvic sidewall lymphadenopathy. Reproductive: The prostate gland and seminal vesicles are unremarkable. Other: No intraperitoneal free fluid. Musculoskeletal: Status post left total hip replacement. No worrisome lytic or sclerotic osseous abnormality. IMPRESSION: 1. Stable exam. No new or progressive findings on the current study. 2. Stable nodular consolidative lesions in the right parahilar lung and right lower lobe at the dome of the hemidiaphragm. The pleural/subpleural nodule in the paraspinal right lower lobe is also unchanged. 3. Left nephrectomy without evidence for features of local recurrence in the nephrectomy bed. 4. Stable scarring upper pole right kidney. 5. Cholelithiasis. 6. Left colonic diverticulosis without diverticulitis. 7.  Aortic Atherosclerois (ICD10-170.0) Electronically Signed   By: Donnal Fusi M.D.   On: 07/16/2023 06:35    ASSESSMENT AND PLAN: This is a very pleasant 68 years old white male diagnosed with stage IV clear-cell renal cell carcinoma with pulmonary involvement as well as pituitary metastasis in 2023.  The patient was initially diagnosed as stage III and October 2021 status post robotic assisted laparoscopic left radical nephrectomy followed by radiation therapy to the pulmonary nodules in March 2023 followed by cryoablation to 2 lesions in the right kidney completed in May 2023.  He is also status post endoscopic transsphenoidal pituitary resection on November 08, 2021.  He is also status post SRS to the pituitary bed in October 2023. The patient is currently undergoing treatment with Keytruda 200 Mg IV every 3 weeks status post 28 cycles.  This was started on Aug 04, 2021.  He is also on axitinib 5 mg p.o. daily since November 2023.   The patient has been tolerating this treatment well except for the few episodes of diarrhea improved with Imodium.  He had repeat CT scan of the chest, abdomen and pelvis that showed no concerning findings for disease progression.    Stage IV renal cell carcinoma with pulmonary and pituitary metastasis Marc [name] has stage IV renal cell carcinoma with metastasis to the lungs and pituitary gland, diagnosed in 2023. He is undergoing systemic therapy with pembrolizumab 200 mg IV every three weeks and axitinib 5 mg orally daily, having completed 28 cycles. Recent CT scans of the chest, abdomen, and pelvis show no new lesions or growth of existing lesions, indicating effective disease containment. Radiation therapy was discussed for existing lesions, but it would not address unseen cancer cells and could complicate assessment of live cancer cells due to scarring. Kaz expressed concern about stopping treatment after 35 cycles, but continuation is possible beyond two years if effective and well-tolerated, as allowed by the clinical trial design until disease progression or unacceptable toxicity. - Continue  pembrolizumab 200 mg IV every three weeks. - Continue axitinib 5 mg orally daily. - Consider radiation therapy for new or enlarging lesions if they occur. - Discuss continuation of pembrolizumab beyond 35 cycles if effective and well-tolerated.  Dizziness Maddon experienced a single episode of dizziness two days ago, described as a 'drunk dizzy' feeling, which resolved spontaneously and has not recurred. Monitoring for recurrence is important due to pituitary metastasis. - Monitor for recurrence of dizziness and report if it occurs  again.  Diarrhea Stillman reports diarrhea, managed with over-the-counter medication, a known side effect of his cancer treatment regimen. - Continue current management of diarrhea with over-the-counter medication as needed.  Follow-up Regular follow-up is scheduled to monitor treatment response and adjust the plan as necessary. - Schedule next follow-up appointment in three weeks.   He was advised to call immediately if he has any other concerning symptoms in the interval. The patient voices understanding of current disease status and treatment options and is in agreement with the current care plan.  All questions were answered. The patient knows to call the clinic with any problems, questions or concerns. We can certainly see the patient much sooner if necessary.  The total time spent in the appointment was 30 minutes.  Disclaimer: This note was dictated with voice recognition software. Similar sounding words can inadvertently be transcribed and may not be corrected upon review.

## 2023-07-31 ENCOUNTER — Other Ambulatory Visit: Payer: Self-pay | Admitting: Internal Medicine

## 2023-07-31 DIAGNOSIS — C642 Malignant neoplasm of left kidney, except renal pelvis: Secondary | ICD-10-CM

## 2023-07-31 NOTE — Progress Notes (Signed)
 [facility] OFFICE PROGRESS NOTE  [name], [place], [name] 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 Sherrine Dolly on January 05, 2020.  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  The final pathological staging was T3aN0 grade 2 tumor.   2) Status post radiation therapy to the pulmonary nodules completed March 2023.  He received 50 Gray in 5 fractions. 3) Status post cryoablation to 2 lesions in the right kidney completed on Aug 23, 2021. 4) status post endoscopic transsphenoidal pituitary resection completed by Dr. Melissa Spring on November 08, 2021.  The final pathology showed clear-cell renal cell carcinoma. 5) SRS treatment to pituitary bed completed in October 2023.  He received 5 fractions for a total of 25 Gray.  CURRENT THERAPY: Keytruda  200 Mg IV every 3 weeks started Aug 04, 2021 status post 29 cycles with axitinib  5 mg p.o. daily started February 09, 2022.   INTERVAL HISTORY: Marc [name] 68 y.o. male returns to the clinic today for a follow-up visit. He is feeling fairly well today without any concerning complaints. He is currently on treatment with Keytruda  every 3 weeks and Axitinib  p.o. daily.  The patient is tolerating treatment well without any concerning adverse side effects except for diarrhea but he controls this with imodium and denies any new concerns with regard to this.    The patient denies any recent fever, chills, night sweats, unexplained weight loss. He has been trying to be active with walking every day. He denies any appetite changes. He denies any rashes or skin changes. He denies any rashes or skin changes at this time.  Denies any chest pain, shortness of breath, cough, or hemoptysis.  Denies any nausea, vomiting, or constipation. Denies any dysuria, malodorous urine, urinary frequency or urgency.  Denies any headache or visual changes.  He is followed by Dr. Mark Sil and IR. He is here for evaluation and repeat blood work before undergoing cycle #30.    MEDICAL HISTORY: Past Medical History:  Diagnosis Date   Arthritis    Cancer (HCC)    Kidney, Lung, Pituitary   Hyperlipidemia    Hypothyroidism     ALLERGIES:  has no known allergies.  MEDICATIONS:  Current Outpatient Medications  Medication Sig Dispense Refill   Ascorbic Acid  (VITAMIN C PO) Take 1 tablet by mouth every evening.     aspirin  EC 81 MG tablet Take 81 mg by mouth every evening. Swallow whole.     chlorhexidine  (PERIDEX ) 0.12 % solution SMARTSIG:By Mouth     cholecalciferol  (VITAMIN D3) 25 MCG (1000 UNIT) tablet Take 1,000 Units by mouth every evening.     Ferrous Sulfate  (IRON SLOW RELEASE PO) Take 1 tablet by mouth every evening.     Ferrous Sulfate  Dried (SM SLOW RELEASE IRON) 143 (45 Fe) MG TBCR as directed Orally     HYDROcodone -acetaminophen  (NORCO) 10-325 MG tablet Take by mouth.     ibuprofen  (ADVIL ) 200 MG tablet Take 400-600 mg by mouth every 6 (six) hours as needed for moderate pain.     INLYTA  5 MG tablet TAKE 1 TABLET BY MOUTH DAILY 30 tablet 3   levothyroxine  (SYNTHROID ) 88 MCG tablet Take 1 tablet (88 mcg  total) by mouth daily. 90 tablet 3   Multiple Vitamin (MULTI VITAMIN) TABS 1 tablet Orally Once a day for 30 day(s)     Multiple Vitamins-Minerals (MULTIVITAMIN WITH MINERALS) tablet Take 1 tablet by mouth every evening.     Omega-3 Fatty Acids (FISH OIL PO) Take 1 capsule by mouth every evening.     ondansetron  (ZOFRAN -ODT) 8 MG disintegrating tablet Take 8 mg by mouth daily.     predniSONE  (DELTASONE ) 5 MG tablet Take 1.5 tablets (7.5 mg total) by mouth daily with breakfast. 150 tablet 3   prochlorperazine  (COMPAZINE ) 10  MG tablet Take 1 tablet (10 mg total) by mouth every 6 (six) hours as needed for nausea or vomiting. 30 tablet 0   rosuvastatin  (CRESTOR ) 20 MG tablet TAKE 1 TABLET(20 MG) BY MOUTH DAILY 90 tablet 1   Testosterone  (ANDROGEL ) 20.25 MG/1.25GM (1.62%) GEL Place 3 Pump onto the skin as directed. 150 g 5   Testosterone  1.62 % GEL SMARTSIG:81 Milligram(s) Topical Every Morning     vitamin E  180 MG (400 UNITS) capsule Take 400 Units by mouth every evening.     No current facility-administered medications for this visit.    SURGICAL HISTORY:  Past Surgical History:  Procedure Laterality Date   CRANIOTOMY N/A 11/08/2021   Procedure: ENDOSCOPIC ENDONASAL RESECTION OF SELLAR MASS;  Surgeon: Cannon Champion, MD;  Location: [facility] OR;  Service: Neurosurgery;  Laterality: N/A;  RM 20   IR RADIOLOGIST EVAL & MGMT  07/14/2021   IR RADIOLOGIST EVAL & MGMT  07/27/2021   IR RADIOLOGIST EVAL & MGMT  01/30/2022   IR RADIOLOGIST EVAL & MGMT  05/08/2023   RADIOFREQUENCY ABLATION N/A 08/23/2021   Procedure: RENAL CRYO ABLATION;  Surgeon: Lucinda Saber, MD;  Location: [facility] ORS;  Service: Anesthesiology;  Laterality: N/A;   right shoulder rotator cuff repair Right    ROBOT ASSISTED LAPAROSCOPIC NEPHRECTOMY Left 01/05/2020   Procedure: XI ROBOTIC ASSISTED LAPAROSCOPIC RADICAL NEPHRECTOMY;  Surgeon: Adelbert Homans, MD;  Location: [facility] ORS;  Service: Urology;  Laterality: Left;   TOTAL HIP ARTHROPLASTY Left 03/31/2020   Procedure: LEFT TOTAL HIP ARTHROPLASTY ANTERIOR APPROACH;  Surgeon: Arnie Lao, MD;  Location: [facility] OR;  Service: Orthopedics;  Laterality: Left;   TRANSPHENOIDAL APPROACH EXPOSURE N/A 11/08/2021   Procedure: TRANSPHENOIDAL APPROACH EXPOSURE;  Surgeon: Ammon Bales, MD;  Location: [facility] OR;  Service: ENT;  Laterality: N/A;    REVIEW OF SYSTEMS:   Constitutional: Negative for appetite change, chills, fatigue, fever and unexpected weight change.  HENT: Negative for mouth sores, nosebleeds,  sore throat and trouble swallowing.   Eyes: Negative for eye problems and icterus.  Respiratory: Negative for cough, hemoptysis, shortness of breath and wheezing.   Cardiovascular: Negative for chest pain and leg swelling.  Gastrointestinal: Positive for controlled diarrhea. Negative for abdominal pain, constipation, nausea and vomiting.  Genitourinary: Negative for bladder incontinence, difficulty urinating, dysuria, frequency and hematuria.   Musculoskeletal: Negative for back pain, gait problem, neck pain and neck stiffness.  Skin: Improved rash/dry skin.  Neurological: Negative for dizziness, extremity weakness, gait problem, headaches, light-headedness and seizures.  Hematological: Negative for adenopathy. Does not bruise/bleed easily.  Psychiatric/Behavioral: Negative for confusion, depression and sleep disturbance. The patient is not nervous/anxious.      PHYSICAL EXAMINATION:  There were no vitals taken for this visit.  ECOG PERFORMANCE STATUS: 1  Physical Exam  Constitutional: Oriented to person, place, and time and well-developed, well-nourished, and in no distress.  HENT:  Head: Normocephalic  and atraumatic.  Mouth/Throat: Oropharynx is clear and moist. No oropharyngeal exudate.  Eyes: Conjunctivae are normal. Right eye exhibits no discharge. Left eye exhibits no discharge. No scleral icterus.  Neck: Normal range of motion. Neck supple.  Cardiovascular: Normal rate, regular rhythm, normal heart sounds and intact distal pulses.   Pulmonary/Chest: Effort normal and breath sounds normal. No respiratory distress. No wheezes. No rales.  Abdominal: Soft. Bowel sounds are normal. Exhibits no distension and no mass. There is no tenderness.  Musculoskeletal: Normal range of motion. Exhibits no edema.  Lymphadenopathy:    No cervical adenopathy.  Neurological: Alert and oriented to person, place, and time. Exhibits normal muscle tone. Gait normal. Coordination normal.  Skin: Skin is  warm and dry. Not diaphoretic. No erythema. No pallor.  Psychiatric: Mood, memory and judgment normal.  Vitals reviewed.  LABORATORY DATA: Lab Results  Component Value Date   WBC 10.6 (H) 07/16/2023   HGB 14.5 07/16/2023   HCT 43.4 07/16/2023   MCV 90.8 07/16/2023   PLT 225 07/16/2023      Chemistry      Component Value Date/Time   NA 139 07/16/2023 1024   NA 140 08/09/2022 1014   K 4.4 07/16/2023 1024   CL 103 07/16/2023 1024   CO2 31 07/16/2023 1024   BUN 20 07/16/2023 1024   BUN 18 08/09/2022 1014   CREATININE 1.40 (H) 07/16/2023 1024      Component Value Date/Time   CALCIUM  9.3 07/16/2023 1024   ALKPHOS 61 07/16/2023 1024   AST 30 07/16/2023 1024   ALT 47 (H) 07/16/2023 1024   BILITOT 0.4 07/16/2023 1024       RADIOGRAPHIC STUDIES:  CT Chest Wo Contrast Result Date: 07/16/2023 CLINICAL DATA:  Metastatic renal cell carcinoma. Restaging. * Tracking Code: BO * EXAM: CT CHEST, ABDOMEN AND PELVIS WITHOUT CONTRAST TECHNIQUE: Multidetector CT imaging of the chest, abdomen and pelvis was performed following the standard protocol without IV contrast. RADIATION DOSE REDUCTION: This exam was performed according to the departmental dose-optimization program which includes automated exposure control, adjustment of the mA and/or kV according to patient size and/or use of iterative reconstruction technique. COMPARISON:  04/16/2023 FINDINGS: CT CHEST FINDINGS Cardiovascular: The heart size is normal. No substantial pericardial effusion. Coronary artery calcification is evident. Mild atherosclerotic calcification is noted in the wall of the thoracic aorta. Mediastinum/Nodes: No mediastinal lymphadenopathy. No evidence for gross hilar lymphadenopathy although assessment is limited by the lack of intravenous contrast on the current study. The esophagus has normal imaging features. There is no axillary lymphadenopathy. Lungs/Pleura: Nodular consolidative lesion in the right parahilar lung  measures 3.0 x 2.7 cm today on image 66/8, similar to prior of 3.1 x 2.6 cm when remeasured on the previous exam. Similar adjacent architectural distortion. Nodular opacity in the right lower lobe at the dome of the hemidiaphragm is also similar to prior with surrounding areas of architectural distortion/scarring. Confluent nodular component measures 3.0 x 1.6 cm today compared to 3.1 x 1.6 cm previously (remeasured). The pleural/subpleural nodule in the paraspinal right lower lobe measured previously at 2.7 x 1.6 cm is 2.8 x 1.6 cm today on image 106/8. No new suspicious pulmonary nodule or mass. There is no evidence of pleural effusion. Musculoskeletal: No worrisome lytic or sclerotic osseous abnormality. CT ABDOMEN PELVIS FINDINGS Hepatobiliary: No suspicious focal abnormality in the liver on this study without intravenous contrast. Layering tiny 1-2 mm calcified gallstones noted. No gallbladder wall thickening or pericholecystic fluid. No intrahepatic or extrahepatic  biliary dilation. Pancreas: No focal mass lesion. No dilatation of the main duct. No intraparenchymal cyst. No peripancreatic edema. Spleen: No splenomegaly. No suspicious focal mass lesion. Adrenals/Urinary Tract: Left adrenal gland not clearly visualized. Right adrenal gland unremarkable. Status post left nephrectomy. Stable 11 mm focus of soft tissue density with calcification or suture material in the left para-aortic space (31/2) compatible with postsurgical scarring. No new or progressive soft tissue in the left nephrectomy bed. Right kidney demonstrates cortical scarring in the upper pole but is otherwise unremarkable by noncontrast CT imaging. No right hydroureter. The urinary bladder appears normal for the degree of distention. Stomach/Bowel: Stomach is unremarkable. No gastric wall thickening. No evidence of outlet obstruction. Duodenum is normally positioned as is the ligament of Treitz. No small bowel wall thickening. No small bowel  dilatation. The terminal ileum is normal. The appendix is normal. No gross colonic mass. No colonic wall thickening. Diverticular changes are noted in the left colon without evidence of diverticulitis. Vascular/Lymphatic: There is mild atherosclerotic calcification of the abdominal aorta without aneurysm. There is no gastrohepatic or hepatoduodenal ligament lymphadenopathy. No retroperitoneal or mesenteric lymphadenopathy. No pelvic sidewall lymphadenopathy. Reproductive: The prostate gland and seminal vesicles are unremarkable. Other: No intraperitoneal free fluid. Musculoskeletal: Status post left total hip replacement. No worrisome lytic or sclerotic osseous abnormality. IMPRESSION: 1. Stable exam. No new or progressive findings on the current study. 2. Stable nodular consolidative lesions in the right parahilar lung and right lower lobe at the dome of the hemidiaphragm. The pleural/subpleural nodule in the paraspinal right lower lobe is also unchanged. 3. Left nephrectomy without evidence for features of local recurrence in the nephrectomy bed. 4. Stable scarring upper pole right kidney. 5. Cholelithiasis. 6. Left colonic diverticulosis without diverticulitis. 7.  Aortic Atherosclerois (ICD10-170.0) Electronically Signed   By: Donnal Fusi M.D.   On: 07/16/2023 06:35   CT ABDOMEN PELVIS WO CONTRAST Result Date: 07/16/2023 CLINICAL DATA:  Metastatic renal cell carcinoma. Restaging. * Tracking Code: BO * EXAM: CT CHEST, ABDOMEN AND PELVIS WITHOUT CONTRAST TECHNIQUE: Multidetector CT imaging of the chest, abdomen and pelvis was performed following the standard protocol without IV contrast. RADIATION DOSE REDUCTION: This exam was performed according to the departmental dose-optimization program which includes automated exposure control, adjustment of the mA and/or kV according to patient size and/or use of iterative reconstruction technique. COMPARISON:  04/16/2023 FINDINGS: CT CHEST FINDINGS Cardiovascular: The  heart size is normal. No substantial pericardial effusion. Coronary artery calcification is evident. Mild atherosclerotic calcification is noted in the wall of the thoracic aorta. Mediastinum/Nodes: No mediastinal lymphadenopathy. No evidence for gross hilar lymphadenopathy although assessment is limited by the lack of intravenous contrast on the current study. The esophagus has normal imaging features. There is no axillary lymphadenopathy. Lungs/Pleura: Nodular consolidative lesion in the right parahilar lung measures 3.0 x 2.7 cm today on image 66/8, similar to prior of 3.1 x 2.6 cm when remeasured on the previous exam. Similar adjacent architectural distortion. Nodular opacity in the right lower lobe at the dome of the hemidiaphragm is also similar to prior with surrounding areas of architectural distortion/scarring. Confluent nodular component measures 3.0 x 1.6 cm today compared to 3.1 x 1.6 cm previously (remeasured). The pleural/subpleural nodule in the paraspinal right lower lobe measured previously at 2.7 x 1.6 cm is 2.8 x 1.6 cm today on image 106/8. No new suspicious pulmonary nodule or mass. There is no evidence of pleural effusion. Musculoskeletal: No worrisome lytic or sclerotic osseous abnormality. CT ABDOMEN PELVIS  FINDINGS Hepatobiliary: No suspicious focal abnormality in the liver on this study without intravenous contrast. Layering tiny 1-2 mm calcified gallstones noted. No gallbladder wall thickening or pericholecystic fluid. No intrahepatic or extrahepatic biliary dilation. Pancreas: No focal mass lesion. No dilatation of the main duct. No intraparenchymal cyst. No peripancreatic edema. Spleen: No splenomegaly. No suspicious focal mass lesion. Adrenals/Urinary Tract: Left adrenal gland not clearly visualized. Right adrenal gland unremarkable. Status post left nephrectomy. Stable 11 mm focus of soft tissue density with calcification or suture material in the left para-aortic space (31/2)  compatible with postsurgical scarring. No new or progressive soft tissue in the left nephrectomy bed. Right kidney demonstrates cortical scarring in the upper pole but is otherwise unremarkable by noncontrast CT imaging. No right hydroureter. The urinary bladder appears normal for the degree of distention. Stomach/Bowel: Stomach is unremarkable. No gastric wall thickening. No evidence of outlet obstruction. Duodenum is normally positioned as is the ligament of Treitz. No small bowel wall thickening. No small bowel dilatation. The terminal ileum is normal. The appendix is normal. No gross colonic mass. No colonic wall thickening. Diverticular changes are noted in the left colon without evidence of diverticulitis. Vascular/Lymphatic: There is mild atherosclerotic calcification of the abdominal aorta without aneurysm. There is no gastrohepatic or hepatoduodenal ligament lymphadenopathy. No retroperitoneal or mesenteric lymphadenopathy. No pelvic sidewall lymphadenopathy. Reproductive: The prostate gland and seminal vesicles are unremarkable. Other: No intraperitoneal free fluid. Musculoskeletal: Status post left total hip replacement. No worrisome lytic or sclerotic osseous abnormality. IMPRESSION: 1. Stable exam. No new or progressive findings on the current study. 2. Stable nodular consolidative lesions in the right parahilar lung and right lower lobe at the dome of the hemidiaphragm. The pleural/subpleural nodule in the paraspinal right lower lobe is also unchanged. 3. Left nephrectomy without evidence for features of local recurrence in the nephrectomy bed. 4. Stable scarring upper pole right kidney. 5. Cholelithiasis. 6. Left colonic diverticulosis without diverticulitis. 7.  Aortic Atherosclerois (ICD10-170.0) Electronically Signed   By: Donnal Fusi M.D.   On: 07/16/2023 06:35     ASSESSMENT/PLAN:  This is a very pleasant 68 year old male diagnosed with renal cell carcinoma.  He was initially diagnosed and  2021.  He was found to have metastatic disease with pulmonary involvement and pituitary metastasis and 2023   The patient underwent robot-assisted laparoscopic left radical nephrectomy by Dr. Sherrine Dolly on 01/05/2020 that showed a 10.7 cm lesion with extension into the perinephric tissue 0 of 4 lymph nodes involved.  The final pathology was T3a, N0, grade 2 tumor.   He then underwent radiation therapy to the pulmonary nodules which was completed in March 2023.   He then underwent cryoablation to 2 lesions in the right kidney which was performed on 08/23/2021   He then underwent SRS treatment to the pituitary bed which was completed in October 2023.   he patient is currently undergoing pembrolizumab  200 mg IV every 3 weeks which is started on 08/04/2021. He is status post 29 cycles.  He is also on Axitnib 5 mg daily which was started in February 09, 2022.    Labs were reviewed.  Recommend that he proceed with cycle #30 today as scheduled.   We will see him back for follow-up visit in 3 weeks for evaluation repeat blood work before undergoing cycle #31    He will continue to use imodium for diarrhea.  The patient was advised to call immediately if he has any concerning symptoms in the interval. The patient voices  understanding of current disease status and treatment options and is in agreement with the current care plan. All questions were answered. The patient knows to call the clinic with any problems, questions or concerns. We can certainly see the patient much sooner if necessary        No orders of the defined types were placed in this encounter.    The total time spent in the appointment was 20-29 minutes  Alanya Vukelich L Kiesha Ensey, PA-C 07/31/23

## 2023-08-06 ENCOUNTER — Inpatient Hospital Stay: Attending: Oncology

## 2023-08-06 ENCOUNTER — Inpatient Hospital Stay

## 2023-08-06 ENCOUNTER — Inpatient Hospital Stay (HOSPITAL_BASED_OUTPATIENT_CLINIC_OR_DEPARTMENT_OTHER): Admitting: Physician Assistant

## 2023-08-06 VITALS — BP 133/82 | HR 58 | Resp 14

## 2023-08-06 VITALS — BP 140/92 | HR 63 | Temp 97.4°F | Resp 13 | Wt 225.3 lb

## 2023-08-06 DIAGNOSIS — C642 Malignant neoplasm of left kidney, except renal pelvis: Secondary | ICD-10-CM

## 2023-08-06 DIAGNOSIS — Z79899 Other long term (current) drug therapy: Secondary | ICD-10-CM | POA: Insufficient documentation

## 2023-08-06 DIAGNOSIS — Z5112 Encounter for antineoplastic immunotherapy: Secondary | ICD-10-CM

## 2023-08-06 DIAGNOSIS — C7989 Secondary malignant neoplasm of other specified sites: Secondary | ICD-10-CM | POA: Diagnosis not present

## 2023-08-06 DIAGNOSIS — C641 Malignant neoplasm of right kidney, except renal pelvis: Secondary | ICD-10-CM | POA: Diagnosis present

## 2023-08-06 DIAGNOSIS — K521 Toxic gastroenteritis and colitis: Secondary | ICD-10-CM | POA: Diagnosis not present

## 2023-08-06 LAB — CBC WITH DIFFERENTIAL (CANCER CENTER ONLY)
Abs Immature Granulocytes: 0.03 10*3/uL (ref 0.00–0.07)
Basophils Absolute: 0 10*3/uL (ref 0.0–0.1)
Basophils Relative: 0 %
Eosinophils Absolute: 0.2 10*3/uL (ref 0.0–0.5)
Eosinophils Relative: 2 %
HCT: 42 % (ref 39.0–52.0)
Hemoglobin: 14 g/dL (ref 13.0–17.0)
Immature Granulocytes: 0 %
Lymphocytes Relative: 23 %
Lymphs Abs: 2.3 10*3/uL (ref 0.7–4.0)
MCH: 29.9 pg (ref 26.0–34.0)
MCHC: 33.3 g/dL (ref 30.0–36.0)
MCV: 89.7 fL (ref 80.0–100.0)
Monocytes Absolute: 0.8 10*3/uL (ref 0.1–1.0)
Monocytes Relative: 8 %
Neutro Abs: 6.7 10*3/uL (ref 1.7–7.7)
Neutrophils Relative %: 67 %
Platelet Count: 209 10*3/uL (ref 150–400)
RBC: 4.68 MIL/uL (ref 4.22–5.81)
RDW: 14.5 % (ref 11.5–15.5)
WBC Count: 10.1 10*3/uL (ref 4.0–10.5)
nRBC: 0 % (ref 0.0–0.2)

## 2023-08-06 LAB — LAB REPORT - SCANNED
EGFR (Non-African Amer.): >60
TSH: 0.01 — AB (ref 0.41–5.90)

## 2023-08-06 LAB — CMP (CANCER CENTER ONLY)
ALT: 40 U/L (ref 0–44)
AST: 28 U/L (ref 15–41)
Albumin: 4.2 g/dL (ref 3.5–5.0)
Alkaline Phosphatase: 56 U/L (ref 38–126)
Anion gap: 8 (ref 5–15)
BUN: 16 mg/dL (ref 8–23)
CO2: 26 mmol/L (ref 22–32)
Calcium: 8.8 mg/dL — ABNORMAL LOW (ref 8.9–10.3)
Chloride: 107 mmol/L (ref 98–111)
Creatinine: 1.3 mg/dL — ABNORMAL HIGH (ref 0.61–1.24)
GFR, Estimated: >60 mL/min (ref >60)
Glucose, Bld: 101 mg/dL — ABNORMAL HIGH (ref 70–99)
Potassium: 3.8 mmol/L (ref 3.5–5.1)
Sodium: 141 mmol/L (ref 135–145)
Total Bilirubin: 0.6 mg/dL (ref 0.0–1.2)
Total Protein: 6.9 g/dL (ref 6.5–8.1)

## 2023-08-06 LAB — TSH: TSH: <0.01 u[IU]/mL — ABNORMAL LOW (ref 0.350–4.500)

## 2023-08-06 MED ORDER — SODIUM CHLORIDE 0.9 % IV SOLN
200.0000 mg | Freq: Once | INTRAVENOUS | Status: AC
  Administered 2023-08-06: 200 mg via INTRAVENOUS
  Filled 2023-08-06: qty 200

## 2023-08-06 MED ORDER — SODIUM CHLORIDE 0.9 % IV SOLN: Freq: Once | INTRAVENOUS | Status: AC

## 2023-08-06 NOTE — Patient Instructions (Signed)

## 2023-08-07 LAB — T4: T4, Total: 6 ug/dL (ref 4.5–12.0)

## 2023-08-08 ENCOUNTER — Telehealth: Payer: Self-pay

## 2023-08-08 NOTE — Telephone Encounter (Signed)
 Faxed recent lab work to [facility] at [phone].  Faxed confirmed.

## 2023-08-28 ENCOUNTER — Inpatient Hospital Stay

## 2023-08-28 ENCOUNTER — Inpatient Hospital Stay (HOSPITAL_BASED_OUTPATIENT_CLINIC_OR_DEPARTMENT_OTHER): Admitting: Internal Medicine

## 2023-08-28 ENCOUNTER — Other Ambulatory Visit: Payer: Self-pay | Admitting: Physician Assistant

## 2023-08-28 VITALS — BP 130/82 | HR 64 | Temp 98.0°F | Resp 17 | Ht 69.0 in | Wt 228.5 lb

## 2023-08-28 DIAGNOSIS — C641 Malignant neoplasm of right kidney, except renal pelvis: Secondary | ICD-10-CM | POA: Diagnosis not present

## 2023-08-28 DIAGNOSIS — C642 Malignant neoplasm of left kidney, except renal pelvis: Secondary | ICD-10-CM

## 2023-08-28 DIAGNOSIS — Z5112 Encounter for antineoplastic immunotherapy: Secondary | ICD-10-CM | POA: Diagnosis not present

## 2023-08-28 LAB — CMP (CANCER CENTER ONLY)
ALT: 51 U/L — ABNORMAL HIGH (ref 0–44)
AST: 30 U/L (ref 15–41)
Albumin: 4.2 g/dL (ref 3.5–5.0)
Alkaline Phosphatase: 54 U/L (ref 38–126)
Anion gap: 7 (ref 5–15)
BUN: 15 mg/dL (ref 8–23)
CO2: 27 mmol/L (ref 22–32)
Calcium: 8.8 mg/dL — ABNORMAL LOW (ref 8.9–10.3)
Chloride: 106 mmol/L (ref 98–111)
Creatinine: 1.18 mg/dL (ref 0.61–1.24)
GFR, Estimated: >60 mL/min (ref >60)
Glucose, Bld: 97 mg/dL (ref 70–99)
Potassium: 3.8 mmol/L (ref 3.5–5.1)
Sodium: 140 mmol/L (ref 135–145)
Total Bilirubin: 0.4 mg/dL (ref 0.0–1.2)
Total Protein: 6.7 g/dL (ref 6.5–8.1)

## 2023-08-28 LAB — CBC WITH DIFFERENTIAL (CANCER CENTER ONLY)
Abs Immature Granulocytes: 0.03 10*3/uL (ref 0.00–0.07)
Basophils Absolute: 0.1 10*3/uL (ref 0.0–0.1)
Basophils Relative: 1 %
Eosinophils Absolute: 0.2 10*3/uL (ref 0.0–0.5)
Eosinophils Relative: 2 %
HCT: 42.4 % (ref 39.0–52.0)
Hemoglobin: 14.2 g/dL (ref 13.0–17.0)
Immature Granulocytes: 0 %
Lymphocytes Relative: 29 %
Lymphs Abs: 3 10*3/uL (ref 0.7–4.0)
MCH: 30 pg (ref 26.0–34.0)
MCHC: 33.5 g/dL (ref 30.0–36.0)
MCV: 89.6 fL (ref 80.0–100.0)
Monocytes Absolute: 0.9 10*3/uL (ref 0.1–1.0)
Monocytes Relative: 8 %
Neutro Abs: 6.1 10*3/uL (ref 1.7–7.7)
Neutrophils Relative %: 60 %
Platelet Count: 228 10*3/uL (ref 150–400)
RBC: 4.73 MIL/uL (ref 4.22–5.81)
RDW: 14.7 % (ref 11.5–15.5)
WBC Count: 10.2 10*3/uL (ref 4.0–10.5)
nRBC: 0 % (ref 0.0–0.2)

## 2023-08-28 LAB — TSH: TSH: <0.01 u[IU]/mL — ABNORMAL LOW (ref 0.350–4.500)

## 2023-08-28 MED ORDER — SODIUM CHLORIDE 0.9 % IV SOLN
200.0000 mg | Freq: Once | INTRAVENOUS | Status: AC
  Administered 2023-08-28: 200 mg via INTRAVENOUS
  Filled 2023-08-28: qty 200

## 2023-08-28 MED ORDER — SODIUM CHLORIDE 0.9 % IV SOLN: Freq: Once | INTRAVENOUS | Status: AC

## 2023-08-28 NOTE — Progress Notes (Signed)
 [facility] Telephone:([phone]) [phone]   [phone]  OFFICE PROGRESS NOTE  [name], [name], [place] 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    PRIOR THERAPY: 1) status post robotic assisted laparoscopic left radical nephrectomy by Dr. Sherrine Dolly on January 05, 2020.  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  The final pathological staging was T3aN0 grade 2 tumor.     2) Status post radiation therapy to the pulmonary nodules completed March 2023.  He received 50 Gray in 5 fractions.   3) Status post cryoablation to 2 lesions in the right kidney completed on Aug 23, 2021.   4) status post endoscopic transsphenoidal pituitary resection completed by Dr. Melissa Spring on November 08, 2021.  The final pathology showed clear-cell renal cell carcinoma.   5) SRS treatment to pituitary bed completed in October 2023.  He received 5 fractions for a total of 25 Gray.  CURRENT THERAPY: Keytruda  200 Mg IV every 3 weeks started Aug 04, 2021 status post 30 cycles with axitinib  5 mg p.o. daily started February 09, 2022.  INTERVAL HISTORY: Marc [name] 68 y.o. male returns to the clinic today for follow-up visit.  Discussed the use of AI scribe software for clinical note transcription with the patient, who gave verbal consent to proceed.  History of Present Illness   Marc [name] "Marc [name]" is a 68 year old male with stage four clear cell renal cell carcinoma who presents for evaluation before starting cycle number thirty one of Keytruda .  He is currently undergoing treatment with Keytruda , receiving 200 mg intravenously every three weeks, and has completed thirty cycles to date.  He experiences diarrhea as a side effect of Keytruda ,  which he manages with Imodium. The severity of diarrhea varies depending on his diet and timing of Imodium intake. Occasionally, he forgets to take Imodium, affecting symptom control.  No new complaints or changes in his condition since the last visit. He states that his condition remains 'the same'. No rash or itching.      MEDICAL HISTORY: Past Medical History:  Diagnosis Date   Arthritis    Cancer (HCC)    Kidney, Lung, Pituitary   Hyperlipidemia    Hypothyroidism     ALLERGIES:  has no known allergies.  MEDICATIONS:  Current Outpatient Medications  Medication Sig Dispense Refill   Ascorbic Acid  (VITAMIN C PO) Take 1 tablet by mouth every evening.     aspirin  EC 81 MG tablet Take 81 mg by mouth every evening. Swallow whole.     chlorhexidine  (PERIDEX ) 0.12 % solution SMARTSIG:By Mouth     cholecalciferol  (VITAMIN D3) 25 MCG (1000 UNIT) tablet Take 1,000 Units by mouth every evening.     Ferrous Sulfate  (IRON SLOW RELEASE PO) Take 1 tablet by mouth every evening.     Ferrous Sulfate  Dried (SM SLOW RELEASE IRON) 143 (45 Fe) MG TBCR as directed Orally     HYDROcodone -acetaminophen  (NORCO) 10-325 MG tablet Take by mouth.     ibuprofen  (ADVIL ) 200 MG tablet Take 400-600 mg by mouth every 6 (six) hours as needed for moderate pain.     INLYTA  5 MG tablet TAKE 1 TABLET BY MOUTH DAILY 30 tablet 3   levothyroxine  (SYNTHROID ) 88 MCG tablet  Take 1 tablet (88 mcg total) by mouth daily. 90 tablet 3   Multiple Vitamin (MULTI VITAMIN) TABS 1 tablet Orally Once a day for 30 day(s)     Multiple Vitamins-Minerals (MULTIVITAMIN WITH MINERALS) tablet Take 1 tablet by mouth every evening.     Omega-3 Fatty Acids (FISH OIL PO) Take 1 capsule by mouth every evening.     ondansetron  (ZOFRAN -ODT) 8 MG disintegrating tablet Take 8 mg by mouth daily.     predniSONE  (DELTASONE ) 5 MG tablet Take 1.5 tablets (7.5 mg total) by mouth daily with breakfast. 150 tablet 3   prochlorperazine  (COMPAZINE ) 10 MG tablet  Take 1 tablet (10 mg total) by mouth every 6 (six) hours as needed for nausea or vomiting. 30 tablet 0   rosuvastatin  (CRESTOR ) 20 MG tablet TAKE 1 TABLET(20 MG) BY MOUTH DAILY 90 tablet 1   Testosterone  (ANDROGEL ) 20.25 MG/1.25GM (1.62%) GEL Place 3 Pump onto the skin as directed. 150 g 5   Testosterone  1.62 % GEL SMARTSIG:81 Milligram(s) Topical Every Morning     vitamin E  180 MG (400 UNITS) capsule Take 400 Units by mouth every evening.     No current facility-administered medications for this visit.    SURGICAL HISTORY:  Past Surgical History:  Procedure Laterality Date   CRANIOTOMY N/A 11/08/2021   Procedure: ENDOSCOPIC ENDONASAL RESECTION OF SELLAR MASS;  Surgeon: Cannon Champion, MD;  Location: [facility];  Service: Neurosurgery;  Laterality: N/A;  RM 20   IR RADIOLOGIST EVAL & MGMT  07/14/2021   IR RADIOLOGIST EVAL & MGMT  07/27/2021   IR RADIOLOGIST EVAL & MGMT  01/30/2022   IR RADIOLOGIST EVAL & MGMT  05/08/2023   RADIOFREQUENCY ABLATION N/A 08/23/2021   Procedure: RENAL CRYO ABLATION;  Surgeon: Lucinda Saber, MD;  Location: [facility];  Service: Anesthesiology;  Laterality: N/A;   right shoulder rotator cuff repair Right    ROBOT ASSISTED LAPAROSCOPIC NEPHRECTOMY Left 01/05/2020   Procedure: XI ROBOTIC ASSISTED LAPAROSCOPIC RADICAL NEPHRECTOMY;  Surgeon: Adelbert Homans, MD;  Location: [facility];  Service: Urology;  Laterality: Left;   TOTAL HIP ARTHROPLASTY Left 03/31/2020   Procedure: LEFT TOTAL HIP ARTHROPLASTY ANTERIOR APPROACH;  Surgeon: Arnie Lao, MD;  Location: [facility] OR;  Service: Orthopedics;  Laterality: Left;   TRANSPHENOIDAL APPROACH EXPOSURE N/A 11/08/2021   Procedure: TRANSPHENOIDAL APPROACH EXPOSURE;  Surgeon: Ammon Bales, MD;  Location: [facility] OR;  Service: ENT;  Laterality: N/A;    REVIEW OF SYSTEMS:  A comprehensive review of systems was negative except for: Gastrointestinal: positive for diarrhea   PHYSICAL EXAMINATION: General appearance: alert,  cooperative, and no distress Head: Normocephalic, without obvious abnormality, atraumatic Neck: no adenopathy, no JVD, supple, symmetrical, trachea midline, and thyroid  not enlarged, symmetric, no tenderness/mass/nodules Lymph nodes: Cervical, supraclavicular, and axillary nodes normal. Resp: clear to auscultation bilaterally Back: symmetric, no curvature. ROM normal. No CVA tenderness. Cardio: regular rate and rhythm, S1, S2 normal, no murmur, click, rub or gallop GI: soft, non-tender; bowel sounds normal; no masses,  no organomegaly Extremities: extremities normal, atraumatic, no cyanosis or edema  ECOG PERFORMANCE STATUS: 1 - Symptomatic but completely ambulatory  Blood pressure 130/82, pulse 64, temperature 98 F (36.7 C), temperature source Temporal, resp. rate 17, height 5\' 9"  (1.753 m), weight 228 lb 8 oz (103.6 kg), SpO2 97%.  LABORATORY DATA: Lab Results  Component Value Date   WBC 10.2 08/28/2023   HGB 14.2 08/28/2023   HCT 42.4 08/28/2023   MCV 89.6 08/28/2023   PLT 228  08/28/2023      Chemistry      Component Value Date/Time   NA 140 08/28/2023 0808   NA 140 08/09/2022 1014   K 3.8 08/28/2023 0808   CL 106 08/28/2023 0808   CO2 27 08/28/2023 0808   BUN 15 08/28/2023 0808   BUN 18 08/09/2022 1014   CREATININE 1.18 08/28/2023 0808      Component Value Date/Time   CALCIUM  8.8 (L) 08/28/2023 0808   ALKPHOS 54 08/28/2023 0808   AST 30 08/28/2023 0808   ALT 51 (H) 08/28/2023 0808   BILITOT 0.4 08/28/2023 0808       RADIOGRAPHIC STUDIES: No results found.   ASSESSMENT AND PLAN: This is a very pleasant 68 years old white male diagnosed with stage IV clear-cell renal cell carcinoma with pulmonary involvement as well as pituitary metastasis in 2023.  The patient was initially diagnosed as stage III and October 2021 status post robotic assisted laparoscopic left radical nephrectomy followed by radiation therapy to the pulmonary nodules in March 2023 followed by  cryoablation to 2 lesions in the right kidney completed in May 2023.  He is also status post endoscopic transsphenoidal pituitary resection on November 08, 2021.  He is also status post SRS to the pituitary bed in October 2023. The patient is currently undergoing treatment with Keytruda  200 Mg IV every 3 weeks status post 30 cycles.  This was started on Aug 04, 2021.  He is also on axitinib  5 mg p.o. daily since November 2023.   The patient has been tolerating this treatment well except for the few episodes of diarrhea improved with Imodium.      Stage 4 clear cell renal cell carcinoma Stage 4 clear cell renal cell carcinoma, well-managed on pembrolizumab  200 mg IV every three weeks. He has completed 30 cycles and is prepared for cycle 31. No new complaints or issues with the treatment. Lab work supports continuation of therapy. Treatment schedule adjustments were discussed to align with his calendar. - Administer pembrolizumab  200 mg IV for cycle 31 - Align treatment schedule with his calendar  Diarrhea due to pembrolizumab  Diarrhea associated with pembrolizumab , managed with loperamide. Symptoms are well-controlled with occasional exacerbations related to diet and timing of loperamide administration. - Continue loperamide as needed for diarrhea   He was advised to call immediately if he has any concerning symptoms in the interval. The patient voices understanding of current disease status and treatment options and is in agreement with the current care plan.  All questions were answered. The patient knows to call the clinic with any problems, questions or concerns. We can certainly see the patient much sooner if necessary.  The total time spent in the appointment was 20 minutes.  Disclaimer: This note was dictated with voice recognition software. Similar sounding words can inadvertently be transcribed and may not be corrected upon review.

## 2023-08-28 NOTE — Patient Instructions (Signed)
 [facility] - A DEPT OF [facility]  Discharge Instructions: Thank you for choosing [facility] to provide your oncology and hematology care.   If you have a lab appointment with the Cancer Center, please go directly to the Cancer Center and check in at the registration area.   Wear comfortable clothing and clothing appropriate for easy access to any Portacath or PICC line.   We strive to give you quality time with your provider. You may need to reschedule your appointment if you arrive late (15 or more minutes).  Arriving late affects you and other patients whose appointments are after yours.  Also, if you miss three or more appointments without notifying the office, you may be dismissed from the clinic at the provider's discretion.      For prescription refill requests, have your pharmacy contact our office and allow 72 hours for refills to be completed.    Today you received the following chemotherapy and/or immunotherapy agents Marc [name]      To help prevent nausea and vomiting after your treatment, we encourage you to take your nausea medication as directed.  BELOW ARE SYMPTOMS THAT SHOULD BE REPORTED IMMEDIATELY: *FEVER GREATER THAN 100.4 F (38 C) OR HIGHER *CHILLS OR SWEATING *NAUSEA AND VOMITING THAT IS NOT CONTROLLED WITH YOUR NAUSEA MEDICATION *UNUSUAL SHORTNESS OF BREATH *UNUSUAL BRUISING OR BLEEDING *URINARY PROBLEMS (pain or burning when urinating, or frequent urination) *BOWEL PROBLEMS (unusual diarrhea, constipation, pain near the anus) TENDERNESS IN MOUTH AND THROAT WITH OR WITHOUT PRESENCE OF ULCERS (sore throat, sores in mouth, or a toothache) UNUSUAL RASH, SWELLING OR PAIN  UNUSUAL VAGINAL DISCHARGE OR ITCHING   Items with * indicate a potential emergency and should be followed up as soon as possible or go to the Emergency Department if any problems should occur.  Please show the CHEMOTHERAPY ALERT CARD or IMMUNOTHERAPY  ALERT CARD at check-in to the Emergency Department and triage nurse.  Should you have questions after your visit or need to cancel or reschedule your appointment, please contact [facility] - A DEPT OF [facility]  Dept: [phone]  and follow the prompts.  Office hours are 8:00 a.m. to 4:30 p.m. Monday - Friday. Please note that voicemails left after 4:00 p.m. may not be returned until the following business day.  We are closed weekends and major holidays. You have access to a nurse at all times for urgent questions. Please call the main number to the clinic Dept: [phone] and follow the prompts.   For any non-urgent questions, you may also contact your provider using MyChart. We now offer e-Visits for anyone 85 and older to request care online for non-urgent symptoms. For details visit [email or web address].   Also download the MyChart app! Go to the app store, search "MyChart", open the app, select , and log in with your MyChart username and password.

## 2023-08-29 LAB — T4: T4, Total: 5.8 ug/dL (ref 4.5–12.0)

## 2023-09-13 NOTE — Progress Notes (Unsigned)
 Cardiology Office Note:    Date:  09/16/2023   ID:  Marc [name], DOB February 02, 1956, MRN [id number]  PCP:  Diamond Formica, PA   [facility] Providers Cardiologist:  Knox Perl, MD     Referring MD: Diamond Formica, [name]   Chief Complaint  Patient presents with   Follow-up    HLD    History of Present Illness:    Marc [name] is a 68 y.o. male with a hx of metastatic renal cell carcinoma with pulmonary involvement and pituitary mets, secondary hypothyroidism, s/p left radical nephrectomy in 2021, hypertension, hyperlipidemia, and coronary artery calcification but no obstructive CAD per Dr. Berry Bristol.  He is a former smoker.  He previously followed with Dr. Alroy Aspen.  He presents today for routine annual cardiology follow-up. He remains active with walking daily getting 7-10k steps daily. He can walk 2 x 15 min walks. He [place] [address] [place]. No chest pain, SOB, palpitations, or LE edema.    Past Medical History:  Diagnosis Date   Arthritis    Cancer (HCC)    Kidney, Lung, Pituitary   Hyperlipidemia    Hypothyroidism     Past Surgical History:  Procedure Laterality Date   CRANIOTOMY N/A 11/08/2021   Procedure: ENDOSCOPIC ENDONASAL RESECTION OF SELLAR MASS;  Surgeon: Cannon Champion, MD;  Location: [facility];  Service: Neurosurgery;  Laterality: N/A;  RM 20   IR RADIOLOGIST EVAL & MGMT  07/14/2021   IR RADIOLOGIST EVAL & MGMT  07/27/2021   IR RADIOLOGIST EVAL & MGMT  01/30/2022   IR RADIOLOGIST EVAL & MGMT  05/08/2023   RADIOFREQUENCY ABLATION N/A 08/23/2021   Procedure: RENAL CRYO ABLATION;  Surgeon: Lucinda Saber, MD;  [facility]: [facility];  Service: Anesthesiology;  Laterality: N/A;   right shoulder rotator cuff repair Right    ROBOT ASSISTED LAPAROSCOPIC NEPHRECTOMY Left 01/05/2020   Procedure: XI ROBOTIC ASSISTED LAPAROSCOPIC RADICAL NEPHRECTOMY;  Surgeon: Adelbert Homans, MD;  Location: [facility];  Service: Urology;  Laterality: Left;   TOTAL HIP ARTHROPLASTY Left  03/31/2020   Procedure: LEFT TOTAL HIP ARTHROPLASTY ANTERIOR APPROACH;  Surgeon: Arnie Lao, MD;  Location: [facility] OR;  Service: Orthopedics;  Laterality: Left;   TRANSPHENOIDAL APPROACH EXPOSURE N/A 11/08/2021   Procedure: TRANSPHENOIDAL APPROACH EXPOSURE;  Surgeon: Ammon Bales, MD;  Location: [facility] OR;  Service: ENT;  Laterality: N/A;    Current Medications: Current Meds  Medication Sig   Ascorbic Acid  (VITAMIN C PO) Take 1 tablet by mouth every evening.   aspirin  EC 81 MG tablet Take 81 mg by mouth every evening. Swallow whole.   cholecalciferol  (VITAMIN D3) 25 MCG (1000 UNIT) tablet Take 1,000 Units by mouth every evening.   Ferrous Sulfate  (IRON SLOW RELEASE PO) Take 1 tablet by mouth every evening.   Ferrous Sulfate  Dried (SM SLOW RELEASE IRON) 143 (45 Fe) MG TBCR as directed Orally   ibuprofen  (ADVIL ) 200 MG tablet Take 400-600 mg by mouth every 6 (six) hours as needed for moderate pain.   INLYTA  5 MG tablet TAKE 1 TABLET BY MOUTH DAILY   levothyroxine  (SYNTHROID ) 88 MCG tablet Take 1 tablet (88 mcg total) by mouth daily.   Multiple Vitamins-Minerals (MULTIVITAMIN WITH MINERALS) tablet Take 1 tablet by mouth every evening.   Omega-3 Fatty Acids (FISH OIL PO) Take 1 capsule by mouth every evening.   ondansetron  (ZOFRAN -ODT) 8 MG disintegrating tablet Take 8 mg by mouth daily.   predniSONE  (DELTASONE ) 5 MG tablet Take 1.5 tablets (7.5  mg total) by mouth daily with breakfast.   prochlorperazine  (COMPAZINE ) 10 MG tablet Take 1 tablet (10 mg total) by mouth every 6 (six) hours as needed for nausea or vomiting.   rosuvastatin  (CRESTOR ) 20 MG tablet TAKE 1 TABLET(20 MG) BY MOUTH DAILY   Testosterone  (ANDROGEL ) 20.25 MG/1.25GM (1.62%) GEL Place 3 Pump onto the skin as directed.   vitamin E  180 MG (400 UNITS) capsule Take 400 Units by mouth every evening.     Allergies:   Patient has no known allergies.   Social History   Socioeconomic History   Marital status: Single     Spouse name: Not on file   Number of children: 2   Years of education: Not on file   Highest education level: Not on file  Occupational History   Not on file  Tobacco Use   Smoking status: Former    Current packs/day: 0.00    Types: Cigarettes    Start date: 04/02/1985    Quit date: 04/03/2015    Years since quitting: 8.4   Smokeless tobacco: Never  Vaping Use   Vaping status: Former   Quit date: 04/16/2021   Substances: Nicotine, Flavoring  Substance and Sexual Activity   Alcohol use: Yes    Comment: occas    Drug use: No   Sexual activity: Not on file  Other Topics Concern   Not on file  Social History Narrative   Not on file   Social Drivers of Health   Financial Resource Strain: Not on file  Food Insecurity: No Food Insecurity (11/01/2022)   Hunger Vital Sign    Worried About Running Out of Food in the Last Year: Never true    Ran Out of Food in the Last Year: Never true  Transportation Needs: No Transportation Needs (11/01/2022)   PRAPARE - Administrator, Civil Service (Medical): No    Lack of Transportation (Non-Medical): No  Physical Activity: Not on file  Stress: Not on file  Social Connections: Not on file     Family History: The patient's family history is not on file.  ROS:   Please see the history of present illness.     All other systems reviewed and are negative.  EKGs/Labs/Other Studies Reviewed:    The following studies were reviewed today:  EKG Interpretation Date/Time:  Monday September 16 2023 11:26:29 EDT Ventricular Rate:  67 PR Interval:  158 QRS Duration:  98 QT Interval:  376 QTC Calculation: 397 R Axis:   -16  Text Interpretation: Normal sinus rhythm Minimal voltage criteria for LVH, may be normal variant ( R in aVL ) When compared with ECG of 16-Aug-2021 13:18, No significant change was found Confirmed by Marcie Sever ([id number]) on 09/16/2023 11:51:58 AM    Recent Labs: 08/28/2023: ALT 51; BUN 15; Creatinine 1.18; Hemoglobin 14.2;  Platelet Count 228; Potassium 3.8; Sodium 140; TSH <0.010  Recent Lipid Panel    Component Value Date/Time   CHOL 155 08/09/2022 1014   TRIG 167 (H) 08/09/2022 1014   HDL 54 08/09/2022 1014   CHOLHDL 2.9 08/09/2022 1014   LDLCALC 73 08/09/2022 1014     Risk Assessment/Calculations:                Physical Exam:    VS:  BP 110/86   Pulse 67   Ht 5' 9 (1.753 m)   Wt 227 lb 9.6 oz (103.2 kg)   SpO2 96%   BMI 33.61 kg/m  Wt Readings from Last 3 Encounters:  09/16/23 227 lb 9.6 oz (103.2 kg)  08/28/23 228 lb 8 oz (103.6 kg)  08/06/23 225 lb 4.8 oz (102.2 kg)     GEN:  Well nourished, well developed in no acute distress HEENT: Normal NECK: No JVD; No carotid bruits LYMPHATICS: No lymphadenopathy CARDIAC: RRR, no murmurs, rubs, gallops RESPIRATORY:  Clear to auscultation without rales, wheezing or rhonchi  ABDOMEN: Soft, non-tender, non-distended MUSCULOSKELETAL:  No edema; No deformity  SKIN: Warm and dry NEUROLOGIC:  Alert and oriented x 3 PSYCHIATRIC:  Normal affect   ASSESSMENT:    1. Coronary artery disease without angina pectoris, unspecified vessel or lesion type, unspecified whether native or transplanted heart   2. Hyperlipidemia, unspecified hyperlipidemia type   3. Screening for diabetes mellitus (DM)    PLAN:    In order of problems listed above:   Coronary artery calcification - Unclear how this was evaluated -- no chest pain, can complete more than 4.0 METS -- continue risk factor modification   Hypertension - Currently not on antihypertensives -- BP normotensive   Hyperlipidemia with LDL goal less than 70 - Continue 20 mg Crestor  -- need updated lipid panel and A1c -- will collect today   Will transition care back to Dr. Berry Bristol since Dr. Alroy Aspen is retiring. Will see back in 1 year.             Medication Adjustments/Labs and Tests Ordered: Current medicines are reviewed at length with the patient today.  Concerns regarding  medicines are outlined above.  Orders Placed This Encounter  Procedures   Hemoglobin A1c   Lipid panel   EKG 12-Lead   No orders of the defined types were placed in this encounter.   Patient Instructions  Medication Instructions:  No medication changes were made during today's visit.  *If you need a refill on your cardiac medications before your next appointment, please call your pharmacy*   Lab Work: Labs will be drawn today. If you have labs (blood work) drawn today and your tests are completely normal, you will receive your results only by: MyChart Message (if you have MyChart) OR A paper copy in the mail If you have any lab test that is abnormal or we need to change your treatment, we will call you to review the results.   Testing/Procedures: No procedures were ordered during today's visit.    Follow-Up: At [facility] you and your health needs are our priority.  As part of our continuing mission to provide you with exceptional heart care, we have created designated Provider Care Teams.  These Care Teams include your primary Cardiologist (physician) and Advanced Practice Providers (APPs -  Physician Assistants and Nurse Practitioners) who all work together to provide you with the care you need, when you need it.  We recommend signing up for the patient portal called MyChart.  Sign up information is provided on this After Visit Summary.  MyChart is used to connect with patients for Virtual Visits (Telemedicine).  Patients are able to view lab/test results, encounter notes, upcoming appointments, etc.  Non-urgent messages can be sent to your provider as well.   To learn more about what you can do with MyChart, go to [email or web address].    Your next appointment:   1 year(s)  Provider:   Knox Perl, MD    Other Instructions A letter will be mailed to you as a reminder to call the office for your next follow up appointment.  Signed, Warren Haber  Aayush Gelpi, PA  09/16/2023 12:12 PM    [facility]

## 2023-09-16 ENCOUNTER — Ambulatory Visit: Attending: Physician Assistant | Admitting: Physician Assistant

## 2023-09-16 ENCOUNTER — Encounter: Payer: Self-pay | Admitting: Physician Assistant

## 2023-09-16 ENCOUNTER — Other Ambulatory Visit: Payer: Self-pay | Admitting: Cardiovascular Disease

## 2023-09-16 VITALS — BP 110/86 | HR 67 | Ht 69.0 in | Wt 227.6 lb

## 2023-09-16 DIAGNOSIS — Z131 Encounter for screening for diabetes mellitus: Secondary | ICD-10-CM | POA: Diagnosis present

## 2023-09-16 DIAGNOSIS — E785 Hyperlipidemia, unspecified: Secondary | ICD-10-CM | POA: Diagnosis present

## 2023-09-16 DIAGNOSIS — I2510 Atherosclerotic heart disease of native coronary artery without angina pectoris: Secondary | ICD-10-CM

## 2023-09-16 DIAGNOSIS — E782 Mixed hyperlipidemia: Secondary | ICD-10-CM

## 2023-09-16 NOTE — Patient Instructions (Signed)
 Medication Instructions:  No medication changes were made during today's visit.  *If you need a refill on your cardiac medications before your next appointment, please call your pharmacy*   Lab Work: Labs will be drawn today. If you have labs (blood work) drawn today and your tests are completely normal, you will receive your results only by: MyChart Message (if you have MyChart) OR A paper copy in the mail If you have any lab test that is abnormal or we need to change your treatment, we will call you to review the results.   Testing/Procedures: No procedures were ordered during today's visit.    Follow-Up: At [facility], you and your health needs are our priority.  As part of our continuing mission to provide you with exceptional heart care, we have created designated Provider Care Teams.  These Care Teams include your primary Cardiologist (physician) and Advanced Practice Providers (APPs -  Physician Assistants and Nurse Practitioners) who all work together to provide you with the care you need, when you need it.  We recommend signing up for the patient portal called MyChart.  Sign up information is provided on this After Visit Summary.  MyChart is used to connect with patients for Virtual Visits (Telemedicine).  Patients are able to view lab/test results, encounter notes, upcoming appointments, etc.  Non-urgent messages can be sent to your provider as well.   To learn more about what you can do with MyChart, go to [email or web address].    Your next appointment:   1 year(s)  Provider:   Knox Perl, MD    Other Instructions A letter will be mailed to you as a reminder to call the office for your next follow up appointment.

## 2023-09-17 ENCOUNTER — Ambulatory Visit: Payer: Self-pay | Admitting: Physician Assistant

## 2023-09-17 DIAGNOSIS — I2510 Atherosclerotic heart disease of native coronary artery without angina pectoris: Secondary | ICD-10-CM

## 2023-09-17 DIAGNOSIS — E785 Hyperlipidemia, unspecified: Secondary | ICD-10-CM

## 2023-09-17 LAB — HEMOGLOBIN A1C
Est. average glucose Bld gHb Est-mCnc: 134 mg/dL
Hgb A1c MFr Bld: 6.3 % — ABNORMAL HIGH (ref 4.8–5.6)

## 2023-09-17 LAB — LIPID PANEL
Chol/HDL Ratio: 2.8 ratio (ref 0.0–5.0)
Cholesterol, Total: 177 mg/dL (ref 100–199)
HDL: 63 mg/dL (ref >39)
LDL Chol Calc (NIH): 81 mg/dL (ref 0–99)
Triglycerides: 199 mg/dL — ABNORMAL HIGH (ref 0–149)
VLDL Cholesterol Cal: 33 mg/dL (ref 5–40)

## 2023-09-17 MED ORDER — ROSUVASTATIN CALCIUM 40 MG PO TABS: 40.0000 mg | ORAL_TABLET | Freq: Every day | ORAL | 3 refills | Status: AC

## 2023-09-18 ENCOUNTER — Inpatient Hospital Stay: Attending: Oncology

## 2023-09-18 ENCOUNTER — Inpatient Hospital Stay

## 2023-09-18 ENCOUNTER — Telehealth: Payer: Self-pay

## 2023-09-18 ENCOUNTER — Inpatient Hospital Stay (HOSPITAL_BASED_OUTPATIENT_CLINIC_OR_DEPARTMENT_OTHER): Admitting: Internal Medicine

## 2023-09-18 VITALS — BP 128/85 | HR 65 | Temp 98.0°F | Resp 18 | Ht 69.0 in | Wt 228.6 lb

## 2023-09-18 DIAGNOSIS — C7800 Secondary malignant neoplasm of unspecified lung: Secondary | ICD-10-CM | POA: Insufficient documentation

## 2023-09-18 DIAGNOSIS — Z79620 Long term (current) use of immunosuppressive biologic: Secondary | ICD-10-CM | POA: Diagnosis not present

## 2023-09-18 DIAGNOSIS — Z5112 Encounter for antineoplastic immunotherapy: Secondary | ICD-10-CM | POA: Insufficient documentation

## 2023-09-18 DIAGNOSIS — C642 Malignant neoplasm of left kidney, except renal pelvis: Secondary | ICD-10-CM | POA: Diagnosis present

## 2023-09-18 DIAGNOSIS — R197 Diarrhea, unspecified: Secondary | ICD-10-CM | POA: Insufficient documentation

## 2023-09-18 DIAGNOSIS — E669 Obesity, unspecified: Secondary | ICD-10-CM | POA: Diagnosis not present

## 2023-09-18 LAB — CBC WITH DIFFERENTIAL (CANCER CENTER ONLY)
Abs Immature Granulocytes: 0.02 10*3/uL (ref 0.00–0.07)
Basophils Absolute: 0.1 10*3/uL (ref 0.0–0.1)
Basophils Relative: 1 %
Eosinophils Absolute: 0.2 10*3/uL (ref 0.0–0.5)
Eosinophils Relative: 2 %
HCT: 43.4 % (ref 39.0–52.0)
Hemoglobin: 14.3 g/dL (ref 13.0–17.0)
Immature Granulocytes: 0 %
Lymphocytes Relative: 29 %
Lymphs Abs: 2.8 10*3/uL (ref 0.7–4.0)
MCH: 30 pg (ref 26.0–34.0)
MCHC: 32.9 g/dL (ref 30.0–36.0)
MCV: 91 fL (ref 80.0–100.0)
Monocytes Absolute: 0.9 10*3/uL (ref 0.1–1.0)
Monocytes Relative: 9 %
Neutro Abs: 5.7 10*3/uL (ref 1.7–7.7)
Neutrophils Relative %: 59 %
Platelet Count: 216 10*3/uL (ref 150–400)
RBC: 4.77 MIL/uL (ref 4.22–5.81)
RDW: 14.8 % (ref 11.5–15.5)
WBC Count: 9.6 10*3/uL (ref 4.0–10.5)
nRBC: 0 % (ref 0.0–0.2)

## 2023-09-18 LAB — CMP (CANCER CENTER ONLY)
ALT: 44 U/L (ref 0–44)
AST: 30 U/L (ref 15–41)
Albumin: 4.2 g/dL (ref 3.5–5.0)
Alkaline Phosphatase: 59 U/L (ref 38–126)
Anion gap: 8 (ref 5–15)
BUN: 14 mg/dL (ref 8–23)
CO2: 27 mmol/L (ref 22–32)
Calcium: 8.8 mg/dL — ABNORMAL LOW (ref 8.9–10.3)
Chloride: 107 mmol/L (ref 98–111)
Creatinine: 1.24 mg/dL (ref 0.61–1.24)
GFR, Estimated: >60 mL/min (ref >60)
Glucose, Bld: 94 mg/dL (ref 70–99)
Potassium: 3.8 mmol/L (ref 3.5–5.1)
Sodium: 142 mmol/L (ref 135–145)
Total Bilirubin: 0.5 mg/dL (ref 0.0–1.2)
Total Protein: 6.7 g/dL (ref 6.5–8.1)

## 2023-09-18 LAB — TSH: TSH: <0.01 u[IU]/mL — ABNORMAL LOW (ref 0.350–4.500)

## 2023-09-18 MED ORDER — SODIUM CHLORIDE 0.9 % IV SOLN: Freq: Once | INTRAVENOUS | Status: AC

## 2023-09-18 MED ORDER — SODIUM CHLORIDE 0.9 % IV SOLN
200.0000 mg | Freq: Once | INTRAVENOUS | Status: AC
  Administered 2023-09-18: 200 mg via INTRAVENOUS
  Filled 2023-09-18: qty 200

## 2023-09-18 NOTE — Progress Notes (Signed)
 [facility] Telephone:([phone]   [phone]  OFFICE PROGRESS NOTE  [name], [name], [place] 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    PRIOR THERAPY: 1) status post robotic assisted laparoscopic left radical nephrectomy by Dr. Sherrine Dolly on January 05, 2020.  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  The final pathological staging was T3aN0 grade 2 tumor.     2) Status post radiation therapy to the pulmonary nodules completed March 2023.  He received 50 Gray in 5 fractions.   3) Status post cryoablation to 2 lesions in the right kidney completed on Aug 23, 2021.   4) status post endoscopic transsphenoidal pituitary resection completed by Dr. Melissa Spring on November 08, 2021.  The final pathology showed clear-cell renal cell carcinoma.   5) SRS treatment to pituitary bed completed in October 2023.  He received 5 fractions for a total of 25 Gray.  CURRENT THERAPY: Keytruda  200 Mg IV every 3 weeks started Aug 04, 2021 status post 31 cycles with axitinib  5 mg p.o. daily started February 09, 2022.  INTERVAL HISTORY: Marc [name] 68 y.o. male returns to the clinic today for follow-up visit.  Discussed the use of AI scribe software for clinical note transcription with the patient, who gave verbal consent to proceed.  History of Present Illness   Marc [name] is a 68 year old male with stage four clear cell renal carcinoma who presents for evaluation before starting cycle 32 of immunotherapy.  He was diagnosed with stage four clear cell renal carcinoma in 2023, with metastases to the lungs and pituitary gland. He is undergoing treatment with Keytruda , administered every three weeks, and has completed 31  cycles.  He feels 'pretty good' overall, with no chest pain, breathing issues, nausea, or vomiting. He continues to experience diarrhea, which is managed with Imodium. There has been no significant weight loss or gain recently.  A CT scan of the chest, abdomen, and pelvis was last performed on July 08, 2023, for disease restaging. An MRI was also conducted in early July 2025.       MEDICAL HISTORY: Past Medical History:  Diagnosis Date   Arthritis    Cancer (HCC)    Kidney, Lung, Pituitary   Hyperlipidemia    Hypothyroidism     ALLERGIES:  has no known allergies.  MEDICATIONS:  Current Outpatient Medications  Medication Sig Dispense Refill   Ascorbic Acid  (VITAMIN C PO) Take 1 tablet by mouth every evening.     aspirin  EC 81 MG tablet Take 81 mg by mouth every evening. Swallow whole.     chlorhexidine  (PERIDEX ) 0.12 % solution SMARTSIG:By Mouth     cholecalciferol  (VITAMIN D3) 25 MCG (1000 UNIT) tablet Take 1,000 Units by mouth every evening.     Ferrous Sulfate  (IRON SLOW RELEASE PO) Take 1 tablet by mouth every evening.     Ferrous Sulfate  Dried (SM SLOW RELEASE IRON) 143 (45 Fe) MG TBCR as directed Orally     HYDROcodone -acetaminophen  (NORCO) 10-325 MG tablet Take by mouth.     ibuprofen  (ADVIL ) 200 MG tablet Take 400-600 mg by mouth every 6 (six) hours as needed for moderate pain.     INLYTA  5 MG tablet TAKE 1 TABLET  BY MOUTH DAILY 30 tablet 3   levothyroxine  (SYNTHROID ) 88 MCG tablet Take 1 tablet (88 mcg total) by mouth daily. 90 tablet 3   Multiple Vitamin (MULTI VITAMIN) TABS 1 tablet Orally Once a day for 30 day(s)     Multiple Vitamins-Minerals (MULTIVITAMIN WITH MINERALS) tablet Take 1 tablet by mouth every evening.     Omega-3 Fatty Acids (FISH OIL PO) Take 1 capsule by mouth every evening.     ondansetron  (ZOFRAN -ODT) 8 MG disintegrating tablet Take 8 mg by mouth daily.     predniSONE  (DELTASONE ) 5 MG tablet Take 1.5 tablets (7.5 mg total) by mouth daily with  breakfast. 150 tablet 3   prochlorperazine  (COMPAZINE ) 10 MG tablet Take 1 tablet (10 mg total) by mouth every 6 (six) hours as needed for nausea or vomiting. 30 tablet 0   rosuvastatin  (CRESTOR ) 40 MG tablet Take 1 tablet (40 mg total) by mouth daily. 90 tablet 3   Testosterone  (ANDROGEL ) 20.25 MG/1.25GM (1.62%) GEL Place 3 Pump onto the skin as directed. 150 g 5   Testosterone  1.62 % GEL SMARTSIG:81 Milligram(s) Topical Every Morning     vitamin E  180 MG (400 UNITS) capsule Take 400 Units by mouth every evening.     No current facility-administered medications for this visit.    SURGICAL HISTORY:  Past Surgical History:  Procedure Laterality Date   CRANIOTOMY N/A 11/08/2021   Procedure: ENDOSCOPIC ENDONASAL RESECTION OF SELLAR MASS;  Surgeon: Cannon Champion, MD;  Location: [facility] OR;  Service: Neurosurgery;  Laterality: N/A;  RM 20   IR RADIOLOGIST EVAL & MGMT  07/14/2021   IR RADIOLOGIST EVAL & MGMT  07/27/2021   IR RADIOLOGIST EVAL & MGMT  01/30/2022   IR RADIOLOGIST EVAL & MGMT  05/08/2023   RADIOFREQUENCY ABLATION N/A 08/23/2021   Procedure: RENAL CRYO ABLATION;  Surgeon: Lucinda Saber, MD;  Location: [facility] ORS;  Service: Anesthesiology;  Laterality: N/A;   right shoulder rotator cuff repair Right    ROBOT ASSISTED LAPAROSCOPIC NEPHRECTOMY Left 01/05/2020   Procedure: XI ROBOTIC ASSISTED LAPAROSCOPIC RADICAL NEPHRECTOMY;  Surgeon: Adelbert Homans, MD;  Location: [facility] ORS;  Service: Urology;  Laterality: Left;   TOTAL HIP ARTHROPLASTY Left 03/31/2020   Procedure: LEFT TOTAL HIP ARTHROPLASTY ANTERIOR APPROACH;  Surgeon: Arnie Lao, MD;  Location: [facility] OR;  Service: Orthopedics;  Laterality: Left;   TRANSPHENOIDAL APPROACH EXPOSURE N/A 11/08/2021   Procedure: TRANSPHENOIDAL APPROACH EXPOSURE;  Surgeon: Ammon Bales, MD;  Location: [facility] OR;  Service: ENT;  Laterality: N/A;    REVIEW OF SYSTEMS:  A comprehensive review of systems was negative except for: Gastrointestinal:  positive for diarrhea   PHYSICAL EXAMINATION: General appearance: alert, cooperative, and no distress Head: Normocephalic, without obvious abnormality, atraumatic Neck: no adenopathy, no JVD, supple, symmetrical, trachea midline, and thyroid  not enlarged, symmetric, no tenderness/mass/nodules Lymph nodes: Cervical, supraclavicular, and axillary nodes normal. Resp: clear to auscultation bilaterally Back: symmetric, no curvature. ROM normal. No CVA tenderness. Cardio: regular rate and rhythm, S1, S2 normal, no murmur, click, rub or gallop GI: soft, non-tender; bowel sounds normal; no masses,  no organomegaly Extremities: extremities normal, atraumatic, no cyanosis or edema  ECOG PERFORMANCE STATUS: 1 - Symptomatic but completely ambulatory  Blood pressure 128/85, pulse 65, temperature 98 F (36.7 C), temperature source Tympanic, resp. rate 18, height 5' 9 (1.753 m), weight 228 lb 9.6 oz (103.7 kg), SpO2 98%.  LABORATORY DATA: Lab Results  Component Value Date   WBC 9.6 09/18/2023   HGB 14.3  09/18/2023   HCT 43.4 09/18/2023   MCV 91.0 09/18/2023   PLT 216 09/18/2023      Chemistry      Component Value Date/Time   NA 140 08/28/2023 0808   NA 140 08/09/2022 1014   K 3.8 08/28/2023 0808   CL 106 08/28/2023 0808   CO2 27 08/28/2023 0808   BUN 15 08/28/2023 0808   BUN 18 08/09/2022 1014   CREATININE 1.18 08/28/2023 0808      Component Value Date/Time   CALCIUM  8.8 (L) 08/28/2023 0808   ALKPHOS 54 08/28/2023 0808   AST 30 08/28/2023 0808   ALT 51 (H) 08/28/2023 0808   BILITOT 0.4 08/28/2023 0808       RADIOGRAPHIC STUDIES: No results found.   ASSESSMENT AND PLAN: This is a very pleasant 68 years old white male diagnosed with stage IV clear-cell renal cell carcinoma with pulmonary involvement as well as pituitary metastasis in 2023.  The patient was initially diagnosed as stage III and October 2021 status post robotic assisted laparoscopic left radical nephrectomy followed  by radiation therapy to the pulmonary nodules in March 2023 followed by cryoablation to 2 lesions in the right kidney completed in May 2023.  He is also status post endoscopic transsphenoidal pituitary resection on November 08, 2021.  He is also status post SRS to the pituitary bed in October 2023. The patient is currently undergoing treatment with Keytruda  200 Mg IV every 3 weeks status post 31 cycles.  This was started on Aug 04, 2021.  He is also on axitinib  5 mg p.o. daily since November 2023.   The patient has been tolerating this treatment well except for the few episodes of diarrhea improved with Imodium.   Assessment and Plan    Stage 4 clear cell renal carcinoma with metastases Stage 4 clear cell renal carcinoma with metastases to the lungs and pituitary gland, initially diagnosed in 2023. Currently undergoing immunotherapy with Keytruda , having completed 31 cycles. He is well-managed with no new symptoms such as chest pain or dyspnea. Diarrhea is present but controlled with Imodium. No significant weight changes. Blood work, including CBC, is normal, indicating good treatment tolerance. - Administer cycle 32 of Keytruda . - Order CT scan of the chest, abdomen, and pelvis for restaging. - Schedule MRI for July 7th.  Diarrhea Diarrhea is ongoing but effectively managed with Imodium.  Obesity Obesity is noted with no significant weight changes since the last visit. The condition is stable.  Follow-up Follow-up plans discussed to ensure timely monitoring and treatment adjustments. - Schedule next visit to coincide with scan results review.   The patient was advised to call immediately if he has any concerning symptoms in the interval. The patient voices understanding of current disease status and treatment options and is in agreement with the current care plan.  All questions were answered. The patient knows to call the clinic with any problems, questions or concerns. We can certainly see the  patient much sooner if necessary.  The total time spent in the appointment was 20 minutes.  Disclaimer: This note was dictated with voice recognition software. Similar sounding words can inadvertently be transcribed and may not be corrected upon review.

## 2023-09-18 NOTE — Telephone Encounter (Signed)
 Refill requested for the Testosterone 

## 2023-09-18 NOTE — Patient Instructions (Signed)

## 2023-09-19 LAB — T4: T4, Total: 5.3 ug/dL (ref 4.5–12.0)

## 2023-09-19 MED ORDER — TESTOSTERONE 20.25 MG/1.25GM (1.62%) TD GEL: 3.0000 | TRANSDERMAL | 5 refills | Status: DC

## 2023-09-19 NOTE — Progress Notes (Signed)
 Patient received labs with our office yesterday.  Routed lab work through epic to patients endocrinologist, Dr. Rosalea Collin.

## 2023-10-02 ENCOUNTER — Ambulatory Visit (HOSPITAL_COMMUNITY)
Admission: RE | Admit: 2023-10-02 | Discharge: 2023-10-02 | Disposition: A | Discharge status: Home / Self Care | Source: Ambulatory Visit | Attending: Internal Medicine | Admitting: Internal Medicine

## 2023-10-02 ENCOUNTER — Other Ambulatory Visit: Payer: Self-pay | Admitting: Internal Medicine

## 2023-10-02 DIAGNOSIS — C642 Malignant neoplasm of left kidney, except renal pelvis: Secondary | ICD-10-CM

## 2023-10-02 NOTE — Progress Notes (Signed)
 [facility] OFFICE PROGRESS NOTE  [name], [place], [name] 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 Devere on January 05, 2020.  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  The final pathological staging was T3aN0 grade 2 tumor.   2) Status post radiation therapy to the pulmonary nodules completed March 2023.  He received 50 Gray in 5 fractions. 3) Status post cryoablation to 2 lesions in the right kidney completed on Aug 23, 2021. 4) status post endoscopic transsphenoidal pituitary resection completed by Dr. Mable on November 08, 2021.  The final pathology showed clear-cell renal cell carcinoma. 5) SRS treatment to pituitary bed completed in October 2023.  He received 5 fractions for a total of 25 Gray  CURRENT THERAPY: Keytruda  200 Mg IV every 3 weeks started Aug 04, 2021 status post 32 cycles with axitinib  5 mg p.o. daily started February 09, 2022.   INTERVAL HISTORY: Marc [name] 68 y.o. male returns to the clinic today for a follow-up visit. He is feeling fairly well today without any concerning complaints. He is currently on treatment with Keytruda  every 3 weeks and Axitinib  p.o. daily.  The patient is tolerating treatment well without any concerning adverse side effects except for diarrhea but he controls this with imodium and denies any new concerns with regard to this.   The patient denies any recent fever, chills, night sweats, unexplained weight loss. He has been trying to be active with walking every day. He denies any appetite changes. He denies any rashes or skin changes. Denies any chest pain, shortness of breath, cough, or  hemoptysis.  Denies any nausea, vomiting, or constipation. Denies any dysuria, malodorous urine, urinary frequency or urgency.  Denies any headache or visual changes.  He is followed by Dr. Buckley and IR. He had a brain MRI which is stable. He sees endocrinology for adrnel insuffiencey. His last two scans mention  Small low-attenuation lesion in the interpolar right kidney, too small to characterize but stable. Since he only has one kidney he is wondering about MR. He recently had a restaging CT scan performed. He is here for evaluation and repeat blood work before undergoing cycle #33.     MEDICAL HISTORY: Past Medical History:  Diagnosis Date   Arthritis    Cancer (HCC)    Kidney, Lung, Pituitary   Hyperlipidemia    Hypothyroidism     ALLERGIES:  has no known allergies.  MEDICATIONS:  Current Outpatient Medications  Medication Sig Dispense Refill   Ascorbic Acid  (VITAMIN C PO) Take 1 tablet by mouth every evening.     aspirin  EC 81 MG tablet Take 81 mg by mouth every evening. Swallow whole.     chlorhexidine  (PERIDEX ) 0.12 % solution SMARTSIG:By Mouth     cholecalciferol  (VITAMIN D3) 25 MCG (1000 UNIT) tablet Take 1,000 Units by mouth every evening.     Ferrous Sulfate  (IRON SLOW RELEASE PO) Take 1 tablet by mouth every evening.     Ferrous Sulfate  Dried (SM SLOW RELEASE IRON) 143 (45 Fe) MG TBCR as directed Orally     HYDROcodone -acetaminophen  (NORCO) 10-325 MG tablet Take by mouth.     ibuprofen  (ADVIL ) 200 MG  tablet Take 400-600 mg by mouth every 6 (six) hours as needed for moderate pain.     INLYTA  5 MG tablet TAKE 1 TABLET BY MOUTH DAILY 30 tablet 3   levothyroxine  (SYNTHROID ) 88 MCG tablet Take 1 tablet (88 mcg total) by mouth daily. 90 tablet 3   Multiple Vitamin (MULTI VITAMIN) TABS 1 tablet Orally Once a day for 30 day(s)     Multiple Vitamins-Minerals (MULTIVITAMIN WITH MINERALS) tablet Take 1 tablet by mouth every evening.     Omega-3 Fatty Acids (FISH OIL PO) Take 1  capsule by mouth every evening.     ondansetron  (ZOFRAN -ODT) 8 MG disintegrating tablet Take 8 mg by mouth daily.     predniSONE  (DELTASONE ) 5 MG tablet Take 1.5 tablets (7.5 mg total) by mouth daily with breakfast. 150 tablet 3   prochlorperazine  (COMPAZINE ) 10 MG tablet Take 1 tablet (10 mg total) by mouth every 6 (six) hours as needed for nausea or vomiting. 30 tablet 0   rosuvastatin  (CRESTOR ) 40 MG tablet Take 1 tablet (40 mg total) by mouth daily. 90 tablet 3   Testosterone  (ANDROGEL ) 20.25 MG/1.25GM (1.62%) GEL Place 3 Pump onto the skin as directed. 150 g 5   vitamin E  180 MG (400 UNITS) capsule Take 400 Units by mouth every evening.     No current facility-administered medications for this visit.    SURGICAL HISTORY:  Past Surgical History:  Procedure Laterality Date   CRANIOTOMY N/A 11/08/2021   Procedure: ENDOSCOPIC ENDONASAL RESECTION OF SELLAR MASS;  Surgeon: Cheryle Debby LABOR, MD;  Location: [facility] OR;  Service: Neurosurgery;  Laterality: N/A;  RM 20   IR RADIOLOGIST EVAL & MGMT  07/14/2021   IR RADIOLOGIST EVAL & MGMT  07/27/2021   IR RADIOLOGIST EVAL & MGMT  01/30/2022   IR RADIOLOGIST EVAL & MGMT  05/08/2023   RADIOFREQUENCY ABLATION N/A 08/23/2021   Procedure: RENAL CRYO ABLATION;  Surgeon: Vanice Sharper, MD;  Location: [facility];  Service: Anesthesiology;  Laterality: N/A;   right shoulder rotator cuff repair Right    ROBOT ASSISTED LAPAROSCOPIC NEPHRECTOMY Left 01/05/2020   Procedure: XI ROBOTIC ASSISTED LAPAROSCOPIC RADICAL NEPHRECTOMY;  Surgeon: Devere Lonni Righter, MD;  Location: [facility];  Service: Urology;  Laterality: Left;   TOTAL HIP ARTHROPLASTY Left 03/31/2020   Procedure: LEFT TOTAL HIP ARTHROPLASTY ANTERIOR APPROACH;  Surgeon: Vernetta Lonni GRADE, MD;  Location: [facility] OR;  Service: Orthopedics;  Laterality: Left;   TRANSPHENOIDAL APPROACH EXPOSURE N/A 11/08/2021   Procedure: TRANSPHENOIDAL APPROACH EXPOSURE;  Surgeon: Mable Lenis, MD;  Location: [facility] OR;   Service: ENT;  Laterality: N/A;    REVIEW OF SYSTEMS:   Constitutional: Negative for appetite change, chills, fatigue, fever and unexpected weight change.  HENT: Negative for mouth sores, nosebleeds, sore throat and trouble swallowing.   Eyes: Negative for eye problems and icterus.  Respiratory: Negative for cough, hemoptysis, shortness of breath and wheezing.   Cardiovascular: Negative for chest pain and leg swelling.  Gastrointestinal: Positive for controlled diarrhea. Negative for abdominal pain, constipation, nausea and vomiting.  Genitourinary: Negative for bladder incontinence, difficulty urinating, dysuria, frequency and hematuria.   Musculoskeletal: Negative for back pain, gait problem, neck pain and neck stiffness.  Skin: Improved rash/dry skin.  Neurological: Negative for dizziness, extremity weakness, gait problem, headaches, light-headedness and seizures.  Hematological: Negative for adenopathy. Does not bruise/bleed easily.  Psychiatric/Behavioral: Negative for confusion, depression and sleep disturbance. The patient is not nervous/anxious.      PHYSICAL EXAMINATION:  There were no  vitals taken for this visit.  ECOG PERFORMANCE STATUS: 1  Physical Exam  Constitutional: Oriented to person, place, and time and well-developed, well-nourished, and in no distress.  HENT:  Head: Normocephalic and atraumatic.  Mouth/Throat: Oropharynx is clear and moist. No oropharyngeal exudate.  Eyes: Conjunctivae are normal. Right eye exhibits no discharge. Left eye exhibits no discharge. No scleral icterus.  Neck: Normal range of motion. Neck supple.  Cardiovascular: Normal rate, regular rhythm, normal heart sounds and intact distal pulses.   Pulmonary/Chest: Effort normal and breath sounds normal. No respiratory distress. No wheezes. No rales.  Abdominal: Soft. Bowel sounds are normal. Exhibits no distension and no mass. There is no tenderness.  Musculoskeletal: Normal range of motion.  Exhibits no edema.  Lymphadenopathy:    No cervical adenopathy.  Neurological: Alert and oriented to person, place, and time. Exhibits normal muscle tone. Gait normal. Coordination normal.  Skin: Skin is warm and dry. Not diaphoretic. No erythema. No pallor.  Psychiatric: Mood, memory and judgment normal.  Vitals reviewed.    LABORATORY DATA: Lab Results  Component Value Date   WBC 9.6 09/18/2023   HGB 14.3 09/18/2023   HCT 43.4 09/18/2023   MCV 91.0 09/18/2023   PLT 216 09/18/2023      Chemistry      Component Value Date/Time   NA 142 09/18/2023 0755   NA 140 08/09/2022 1014   K 3.8 09/18/2023 0755   CL 107 09/18/2023 0755   CO2 27 09/18/2023 0755   BUN 14 09/18/2023 0755   BUN 18 08/09/2022 1014   CREATININE 1.24 09/18/2023 0755      Component Value Date/Time   CALCIUM  8.8 (L) 09/18/2023 0755   ALKPHOS 59 09/18/2023 0755   AST 30 09/18/2023 0755   ALT 44 09/18/2023 0755   BILITOT 0.5 09/18/2023 0755       RADIOGRAPHIC STUDIES:  No results found.   ASSESSMENT/PLAN:  This is a very pleasant 68 year old male diagnosed with renal cell carcinoma.  He was initially diagnosed and 2021.  He was found to have metastatic disease with pulmonary involvement and pituitary metastasis and 2023   The patient underwent robot-assisted laparoscopic left radical nephrectomy by Dr. Devere on 01/05/2020 that showed a 10.7 cm lesion with extension into the perinephric tissue 0 of 4 lymph nodes involved.  The final pathology was T3a, N0, grade 2 tumor.   He then underwent radiation therapy to the pulmonary nodules which was completed in March 2023.   He then underwent cryoablation to 2 lesions in the right kidney which was performed on 08/23/2021   He then underwent SRS treatment to the pituitary bed which was completed in October 2023.   he patient is currently undergoing pembrolizumab  200 mg IV every 3 weeks which is started on 08/04/2021. He is status post 32 cycles.  He is also on  Axitnib 5 mg daily which was started in February 09, 2022.   The scan did not show any disease progression. The scan mentions stable small lesion on the kidney small low-attenuation lesion in the interpolar right kidney, too small to characterize but stable. The patient is concerned since he only has one kidney. Therefore, I will order MR abdomen. He is established with IR.   Labs were reviewed.  Recommend that he proceed with cycle #33 today as scheduled.   We will see him back for follow-up visit in 3 weeks for evaluation repeat blood work before undergoing cycle #34    He will continue to  use imodium for diarrhea.   The patient was advised to call immediately if he has any concerning symptoms in the interval. The patient voices understanding of current disease status and treatment options and is in agreement with the current care plan. All questions were answered. The patient knows to call the clinic with any problems, questions or concerns. We can certainly see the patient much sooner if necessary   No orders of the defined types were placed in this encounter.    The total time spent in the appointment was 20-29 minutes  Avonell Lenig L Rebekka Lobello, PA-C 10/02/23

## 2023-10-03 ENCOUNTER — Ambulatory Visit
Admission: RE | Admit: 2023-10-03 | Discharge: 2023-10-03 | Disposition: A | Discharge status: Home / Self Care | Source: Ambulatory Visit | Attending: Internal Medicine | Admitting: Internal Medicine

## 2023-10-03 DIAGNOSIS — C7931 Secondary malignant neoplasm of brain: Secondary | ICD-10-CM

## 2023-10-03 MED ORDER — GADOPICLENOL 0.5 MMOL/ML IV SOLN
10.0000 mL | Freq: Once | INTRAVENOUS | Status: AC | PRN
  Administered 2023-10-03: 10 mL via INTRAVENOUS

## 2023-10-07 ENCOUNTER — Encounter

## 2023-10-07 ENCOUNTER — Inpatient Hospital Stay: Attending: Oncology | Admitting: Internal Medicine

## 2023-10-07 VITALS — BP 137/91 | HR 68 | Temp 97.9°F | Resp 13 | Wt 231.9 lb

## 2023-10-07 DIAGNOSIS — Z79620 Long term (current) use of immunosuppressive biologic: Secondary | ICD-10-CM | POA: Diagnosis not present

## 2023-10-07 DIAGNOSIS — C7931 Secondary malignant neoplasm of brain: Secondary | ICD-10-CM | POA: Diagnosis not present

## 2023-10-07 DIAGNOSIS — Z7952 Long term (current) use of systemic steroids: Secondary | ICD-10-CM | POA: Diagnosis not present

## 2023-10-07 DIAGNOSIS — C641 Malignant neoplasm of right kidney, except renal pelvis: Secondary | ICD-10-CM | POA: Insufficient documentation

## 2023-10-07 DIAGNOSIS — R197 Diarrhea, unspecified: Secondary | ICD-10-CM | POA: Insufficient documentation

## 2023-10-07 DIAGNOSIS — Z5112 Encounter for antineoplastic immunotherapy: Secondary | ICD-10-CM | POA: Diagnosis present

## 2023-10-07 DIAGNOSIS — C7989 Secondary malignant neoplasm of other specified sites: Secondary | ICD-10-CM | POA: Insufficient documentation

## 2023-10-07 NOTE — Progress Notes (Signed)
 [facility] at [facility] [address]  [place], [place] [address] [phone]   Interval Evaluation  Date of Service: 10/07/23 Patient Name: Marc [name] Patient MRN: [id number] Patient DOB: 06-05-1955 Provider: Arthea MARLA Manns, MD  Identifying Statement:  Marc [name] is a 68 y.o. 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 [facility])   Primary Cancer:  Oncologic History: Oncology History  Kidney cancer, primary, with metastasis from kidney to other site [facility])  06/01/2021 Initial Diagnosis   Kidney cancer, primary, with metastasis from kidney to other site [facility])   06/01/2021 Cancer Staging   Staging form: Kidney, AJCC 8th Edition - Clinical: Stage IV (cT3a, cN0, cM1) - Signed by Marc Windell SAILOR, MD on 06/01/2021   12/05/2021 -  Chemotherapy   Patient is on Treatment Plan : Renal Cell Carcinoma Pembrolizumab  (200) q21d     Renal cell carcinoma of right kidney (HCC)  06/13/2021 Initial Diagnosis   Renal cell carcinoma of right kidney (HCC)   12/05/2021 - 12/05/2021 Chemotherapy   Patient is on Treatment Plan : HEAD/NECK Pembrolizumab  (200) q21d      CNS Oncologic History 11/08/21: Debulking of pituitary mass with Dr. Cheryle, path is renal cell carcinoma 01/22/22: Completes SRS 25/5 with Marc [name]  Interval History: Marc [name] presents today for follow up after recent MRI.  He denies new or progressive neurologic symptoms. Visual symptoms have not recurred.  Remains on prednisone  7.5mg  daily through endocrinology.  Continues to follow with Marc [name] for keytruda  infusions.  H+P (02/04/23) Patient presents today for evaluation following MRI brain.  He denies any worsening of his vision.  Gait is independent, no headaches or seizures.  Continues on Keytruda  with Marc [name] for RCC, tolerating well aside from diarrhea.  Medications: Current Outpatient Medications on File Prior to Visit  Medication Sig Dispense Refill   Ascorbic Acid  (VITAMIN C PO) Take 1  tablet by mouth every evening.     aspirin  EC 81 MG tablet Take 81 mg by mouth every evening. Swallow whole.     chlorhexidine  (PERIDEX ) 0.12 % solution SMARTSIG:By Mouth     cholecalciferol  (VITAMIN D3) 25 MCG (1000 UNIT) tablet Take 1,000 Units by mouth every evening.     Ferrous Sulfate  (IRON SLOW RELEASE PO) Take 1 tablet by mouth every evening.     Ferrous Sulfate  Dried (SM SLOW RELEASE IRON) 143 (45 Fe) MG TBCR as directed Orally     HYDROcodone -acetaminophen  (NORCO) 10-325 MG tablet Take by mouth.     ibuprofen  (ADVIL ) 200 MG tablet Take 400-600 mg by mouth every 6 (six) hours as needed for moderate pain.     INLYTA  5 MG tablet TAKE 1 TABLET BY MOUTH DAILY 30 tablet 3   levothyroxine  (SYNTHROID ) 88 MCG tablet Take 1 tablet (88 mcg total) by mouth daily. 90 tablet 3   Multiple Vitamin (MULTI VITAMIN) TABS 1 tablet Orally Once a day for 30 day(s)     Multiple Vitamins-Minerals (MULTIVITAMIN WITH MINERALS) tablet Take 1 tablet by mouth every evening.     Omega-3 Fatty Acids (FISH OIL PO) Take 1 capsule by mouth every evening.     ondansetron  (ZOFRAN -ODT) 8 MG disintegrating tablet Take 8 mg by mouth daily.     predniSONE  (DELTASONE ) 5 MG tablet Take 1.5 tablets (7.5 mg total) by mouth daily with breakfast. 150 tablet 3   prochlorperazine  (COMPAZINE ) 10 MG tablet Take 1 tablet (10 mg total) by mouth every 6 (six) hours as needed for nausea  or vomiting. 30 tablet 0   rosuvastatin  (CRESTOR ) 40 MG tablet Take 1 tablet (40 mg total) by mouth daily. 90 tablet 3   Testosterone  (ANDROGEL ) 20.25 MG/1.25GM (1.62%) GEL Place 3 Pump onto the skin as directed. 150 g 5   vitamin E  180 MG (400 UNITS) capsule Take 400 Units by mouth every evening.     No current facility-administered medications on file prior to visit.    Allergies: No Known Allergies Past Medical History:  Past Medical History:  Diagnosis Date   Arthritis    Cancer (HCC)    Kidney, Lung, Pituitary   Hyperlipidemia     Hypothyroidism    Past Surgical History:  Past Surgical History:  Procedure Laterality Date   CRANIOTOMY N/A 11/08/2021   Procedure: ENDOSCOPIC ENDONASAL RESECTION OF SELLAR MASS;  Surgeon: Marc Debby LABOR, MD;  Location: [facility] OR;  Service: Neurosurgery;  Laterality: N/A;  RM 20   IR RADIOLOGIST EVAL & MGMT  07/14/2021   IR RADIOLOGIST EVAL & MGMT  07/27/2021   IR RADIOLOGIST EVAL & MGMT  01/30/2022   IR RADIOLOGIST EVAL & MGMT  05/08/2023   RADIOFREQUENCY ABLATION N/A 08/23/2021   Procedure: RENAL CRYO ABLATION;  Surgeon: Marc Sharper, MD;  Location: [facility] ORS;  Service: Anesthesiology;  Laterality: N/A;   right shoulder rotator cuff repair Right    ROBOT ASSISTED LAPAROSCOPIC NEPHRECTOMY Left 01/05/2020   Procedure: XI ROBOTIC ASSISTED LAPAROSCOPIC RADICAL NEPHRECTOMY;  Surgeon: Marc Lonni Righter, MD;  Location: [facility] ORS;  Service: Urology;  Laterality: Left;   TOTAL HIP ARTHROPLASTY Left 03/31/2020   Procedure: LEFT TOTAL HIP ARTHROPLASTY ANTERIOR APPROACH;  Surgeon: Marc Lonni GRADE, MD;  Location: [facility] OR;  Service: Orthopedics;  Laterality: Left;   TRANSPHENOIDAL APPROACH EXPOSURE N/A 11/08/2021   Procedure: TRANSPHENOIDAL APPROACH EXPOSURE;  Surgeon: Marc Lenis, MD;  Location: [facility] OR;  Service: ENT;  Laterality: N/A;   Social History:  Social History   Socioeconomic History   Marital status: Single    Spouse name: Not on file   Number of children: 2   Years of education: Not on file   Highest education level: Not on file  Occupational History   Not on file  Tobacco Use   Smoking status: Former    Current packs/day: 0.00    Types: Cigarettes    Start date: 04/02/1985    Quit date: 04/03/2015    Years since quitting: 8.5   Smokeless tobacco: Never  Vaping Use   Vaping status: Former   Quit date: 04/16/2021   Substances: Nicotine, Flavoring  Substance and Sexual Activity   Alcohol use: Yes    Comment: occas    Drug use: No   Sexual activity: Not on file  Other  Topics Concern   Not on file  Social History Narrative   Not on file   Social Drivers of Health   Financial Resource Strain: Not on file  Food Insecurity: No Food Insecurity (11/01/2022)   Hunger Vital Sign    Worried About Running Out of Food in the Last Year: Never true    Ran Out of Food in the Last Year: Never true  Transportation Needs: No Transportation Needs (11/01/2022)   PRAPARE - Administrator, Civil Service (Medical): No    Lack of Transportation (Non-Medical): No  Physical Activity: Not on file  Stress: Not on file  Social Connections: Not on file  Intimate Partner Violence: Not At Risk (11/01/2022)   Humiliation, Afraid, Rape, and Kick questionnaire  Fear of Current or Ex-Partner: No    Emotionally Abused: No    Physically Abused: No    Sexually Abused: No   Family History: No family history on file.  Review of Systems: Constitutional: Doesn't report fevers, chills or abnormal weight loss Eyes: Doesn't report blurriness of vision Ears, nose, mouth, throat, and face: Doesn't report sore throat Respiratory: Doesn't report cough, dyspnea or wheezes Cardiovascular: Doesn't report palpitation, chest discomfort  Gastrointestinal:  Doesn't report nausea, constipation, diarrhea GU: Doesn't report incontinence Skin: Doesn't report skin rashes Neurological: Per HPI Musculoskeletal: Doesn't report joint pain Behavioral/Psych: Doesn't report anxiety  Physical Exam: Vitals:   10/07/23 1037  BP: (!) 137/91  Pulse: 68  Resp: 13  Temp: 97.9 F (36.6 C)  SpO2: 98%   KPS: 90. General: Alert, cooperative, pleasant, in no acute distress Head: Normal EENT: No conjunctival injection or scleral icterus.  Lungs: Resp effort normal Cardiac: Regular rate Abdomen: Non-distended abdomen Skin: No rashes cyanosis or petechiae. Extremities: No clubbing or edema  Neurologic Exam: Mental Status: Awake, alert, attentive to examiner. Oriented to self and environment.  Language is fluent with intact comprehension.  Cranial Nerves: Visual acuity is grossly normal. Visual fields are full. Extra-ocular movements intact. No ptosis. Face is symmetric Motor: Tone and bulk are normal. Power is full in both arms and legs. Reflexes are symmetric, no pathologic reflexes present.  Sensory: Intact to light touch Gait: Normal.   Labs: I have reviewed the data as listed    Component Value Date/Time   NA 142 09/18/2023 0755   NA 140 08/09/2022 1014   K 3.8 09/18/2023 0755   CL 107 09/18/2023 0755   CO2 27 09/18/2023 0755   GLUCOSE 94 09/18/2023 0755   BUN 14 09/18/2023 0755   BUN 18 08/09/2022 1014   CREATININE 1.24 09/18/2023 0755   CALCIUM  8.8 (L) 09/18/2023 0755   PROT 6.7 09/18/2023 0755   ALBUMIN  4.2 09/18/2023 0755   AST 30 09/18/2023 0755   ALT 44 09/18/2023 0755   ALKPHOS 59 09/18/2023 0755   BILITOT 0.5 09/18/2023 0755   GFRNONAA >60 09/18/2023 0755   GFRNONAA >60 08/06/2023 0000   GFRAA >60 01/05/2020 0550   Lab Results  Component Value Date   WBC 9.6 09/18/2023   NEUTROABS 5.7 09/18/2023   HGB 14.3 09/18/2023   HCT 43.4 09/18/2023   MCV 91.0 09/18/2023   PLT 216 09/18/2023    Imaging:  MR BRAIN W WO CONTRAST Result Date: 10/03/2023 CLINICAL DATA:  68 year old male with metastatic renal cell carcinoma. Pituitary metastasis status post surgery and SRS in 2023. Restaging. EXAM: MRI HEAD WITHOUT AND WITH CONTRAST TECHNIQUE: Multiplanar, multiecho pulse sequences of the brain and surrounding structures were obtained without and with intravenous contrast. CONTRAST:  10 mL Vueway  COMPARISON:  Brain MRI 05/30/2023 and earlier. FINDINGS: Brain: Dedicated thin slice pituitary findings described below. Stable cerebral volume. No restricted diffusion to suggest acute infarction. No midline shift, mass effect, evidence of mass lesion, ventriculomegaly, extra-axial collection or acute intracranial hemorrhage. Cervicomedullary junction is within normal  limits. Stable gray-white matter differentiation throughout the brain. Scattered small nonspecific mostly subcortical white matter T2 and FLAIR hyperintense foci. Deep gray nuclei, brainstem and cerebellum appear negative. No chronic cerebral blood products on T2 *. No abnormal gray or white matter enhancement is identified. No abnormal dural thickening is identified. Vascular: Major intracranial vascular flow voids are preserved. Skull and upper cervical spine: Skull base appears stable since last year. Background bone marrow signal  within normal limits. Sinuses/Orbits: New left maxillary sinus mucosal thickening, enhancement, retained secretions. Left greater than right anterior ethmoidal sinus mucosal thickening also. Underlying chronic transsphenoidal postoperative changes. Other paranasal sinuses remain well aerated. Mastoids and visible internal auditory structures appear negative. Other: Dedicated thin slice pre and postcontrast pituitary imaging. Chronic intra sella architectural distortion with rightward deviation of the infundibulum. Stable suprasellar cistern. Hypothalamus appears stable and negative. Within the sella turcica stable small volume of heterogeneously enhancing soft tissue since 01/30/2023. No supra sella or cavernous sinus extension is identified. Cavernous sinus enhancement appears stable and within normal limits. IMPRESSION: 1. Stable and satisfactory post treatment appearance of the sella turcica. Architectural distortion and stable residual intra sellar soft tissue. No regional mass effect or invasion identified. 2. No new intracranial abnormality, stable MRI appearance of the brain. 3. New left side frontoethmoidal pattern of obstructive paranasal sinus disease. No complicating features. Electronically Signed   By: VEAR Hurst M.D.   On: 10/03/2023 11:53   CT CHEST ABDOMEN PELVIS WO CONTRAST Result Date: 10/02/2023 CLINICAL DATA:  Kidney cancer.  * Tracking Code: BO * EXAM: CT CHEST,  ABDOMEN AND PELVIS WITHOUT CONTRAST TECHNIQUE: Multidetector CT imaging of the chest, abdomen and pelvis was performed following the standard protocol without IV contrast. RADIATION DOSE REDUCTION: This exam was performed according to the departmental dose-optimization program which includes automated exposure control, adjustment of the mA and/or kV according to patient size and/or use of iterative reconstruction technique. COMPARISON:  07/08/2023 and MR abdomen 07/24/2021. FINDINGS: CT CHEST FINDINGS Cardiovascular: Atherosclerotic calcification of the aorta, aortic valve and coronary arteries. Heart is at the upper limits of normal in size. No pericardial effusion. Mediastinum/Nodes: No pathologically enlarged mediastinal or axillary lymph nodes. Hilar regions are difficult to definitively evaluate without IV contrast. Esophagus is grossly unremarkable. Lungs/Pleura: Post treatment parenchymal retraction and architectural distortion in the posterior segment right upper lobe unchanged. Additional scarring and volume loss in the anterior right lower lobe. Pleural nodule in the inferomedial right hemithorax measures 1.6 x 2.8 cm (6/111), stable. No new pulmonary nodules. No pleural fluid. Airway is unremarkable. Musculoskeletal: Degenerative changes in the spine. No worrisome lytic or sclerotic lesions. CT ABDOMEN PELVIS FINDINGS Hepatobiliary: Liver is unremarkable. Tiny gallstones. No biliary ductal dilatation. Pancreas: Negative. Spleen: Negative. Adrenals/Urinary Tract: Right adrenal gland is unremarkable. Vague 13 mm low-attenuation lesion in the medial interpolar right kidney (2/64), stable. Further characterization is limited due to size and lack of postcontrast imaging. Scarring in the upper pole right kidney. Left adrenalectomy and left nephrectomy. Right ureter is decompressed. Bladder is grossly unremarkable. Stomach/Bowel: Stomach, small bowel, appendix and colon are unremarkable. Vascular/Lymphatic:  Atherosclerotic calcification of the aorta. No pathologically enlarged lymph nodes. Reproductive: Prostate is visualized. Other: No free fluid.  Mesenteries and peritoneum are unremarkable. Musculoskeletal: Left hip arthroplasty. Degenerative changes in the spine. No worrisome lytic or sclerotic lesions. IMPRESSION: 1. No evidence of disease progression. 2. Stable pleural nodule in the inferomedial right hemithorax. 3. Small low-attenuation lesion in the interpolar right kidney, too small to characterize but stable. Consider repeat MR abdomen without and with contrast in further evaluation, as clinically indicated. 4. Cholelithiasis. 5. Aortic atherosclerosis (ICD10-I70.0). Coronary artery calcification. Electronically Signed   By: Newell Eke M.D.   On: 10/02/2023 13:46    [facility] Clinician Interpretation: I have personally reviewed the radiological images as listed.  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  No new or progressive neurologic deficits.  We recommended continued MRI surveillance.   We appreciate the opportunity to participate in the care of Marc [name].   We ask that Marc [name] return to clinic in 4 months following next brain MRI, or sooner as needed.    All questions were answered. The patient knows to call the clinic with any problems, questions or concerns. No barriers to learning were detected.  The total time spent in the encounter was 40 minutes and more than 50% was on counseling and review of test results   Marc MARLA Manns, MD Medical Director of Neuro-Oncology [facility] 10/07/23 10:45 AM

## 2023-10-08 ENCOUNTER — Inpatient Hospital Stay

## 2023-10-08 ENCOUNTER — Other Ambulatory Visit: Payer: Self-pay | Admitting: Radiation Therapy

## 2023-10-08 ENCOUNTER — Inpatient Hospital Stay (HOSPITAL_BASED_OUTPATIENT_CLINIC_OR_DEPARTMENT_OTHER): Admitting: Physician Assistant

## 2023-10-08 VITALS — BP 128/88 | HR 69 | Temp 98.0°F | Resp 13 | Wt 230.0 lb

## 2023-10-08 DIAGNOSIS — C642 Malignant neoplasm of left kidney, except renal pelvis: Secondary | ICD-10-CM

## 2023-10-08 DIAGNOSIS — Z5112 Encounter for antineoplastic immunotherapy: Secondary | ICD-10-CM

## 2023-10-08 LAB — CMP (CANCER CENTER ONLY)
ALT: 55 U/L — ABNORMAL HIGH (ref 0–44)
AST: 27 U/L (ref 15–41)
Albumin: 4.1 g/dL (ref 3.5–5.0)
Alkaline Phosphatase: 58 U/L (ref 38–126)
Anion gap: 7 (ref 5–15)
BUN: 13 mg/dL (ref 8–23)
CO2: 27 mmol/L (ref 22–32)
Calcium: 9 mg/dL (ref 8.9–10.3)
Chloride: 107 mmol/L (ref 98–111)
Creatinine: 1.17 mg/dL (ref 0.61–1.24)
GFR, Estimated: >60 mL/min (ref >60)
Glucose, Bld: 91 mg/dL (ref 70–99)
Potassium: 4 mmol/L (ref 3.5–5.1)
Sodium: 141 mmol/L (ref 135–145)
Total Bilirubin: 0.4 mg/dL (ref 0.0–1.2)
Total Protein: 6.5 g/dL (ref 6.5–8.1)

## 2023-10-08 LAB — CBC WITH DIFFERENTIAL (CANCER CENTER ONLY)
Abs Immature Granulocytes: 0.05 K/uL (ref 0.00–0.07)
Basophils Absolute: 0.1 K/uL (ref 0.0–0.1)
Basophils Relative: 1 %
Eosinophils Absolute: 0.2 K/uL (ref 0.0–0.5)
Eosinophils Relative: 2 %
HCT: 42.6 % (ref 39.0–52.0)
Hemoglobin: 14.3 g/dL (ref 13.0–17.0)
Immature Granulocytes: 1 %
Lymphocytes Relative: 31 %
Lymphs Abs: 3.2 K/uL (ref 0.7–4.0)
MCH: 30.3 pg (ref 26.0–34.0)
MCHC: 33.6 g/dL (ref 30.0–36.0)
MCV: 90.3 fL (ref 80.0–100.0)
Monocytes Absolute: 0.9 K/uL (ref 0.1–1.0)
Monocytes Relative: 9 %
Neutro Abs: 6 K/uL (ref 1.7–7.7)
Neutrophils Relative %: 56 %
Platelet Count: 210 K/uL (ref 150–400)
RBC: 4.72 MIL/uL (ref 4.22–5.81)
RDW: 15.2 % (ref 11.5–15.5)
WBC Count: 10.4 K/uL (ref 4.0–10.5)
nRBC: 0 % (ref 0.0–0.2)

## 2023-10-08 LAB — TSH: TSH: <0.01 u[IU]/mL — ABNORMAL LOW (ref 0.350–4.500)

## 2023-10-08 MED ORDER — SODIUM CHLORIDE 0.9 % IV SOLN
200.0000 mg | Freq: Once | INTRAVENOUS | Status: AC
  Administered 2023-10-08: 200 mg via INTRAVENOUS
  Filled 2023-10-08: qty 200

## 2023-10-08 MED ORDER — SODIUM CHLORIDE 0.9 % IV SOLN: Freq: Once | INTRAVENOUS | Status: AC

## 2023-10-08 NOTE — Patient Instructions (Signed)

## 2023-10-09 LAB — T4: T4, Total: 6.2 ug/dL (ref 4.5–12.0)

## 2023-10-13 ENCOUNTER — Ambulatory Visit (HOSPITAL_COMMUNITY)
Admission: RE | Admit: 2023-10-13 | Discharge: 2023-10-13 | Disposition: A | Discharge status: Home / Self Care | Source: Ambulatory Visit | Attending: Physician Assistant

## 2023-10-13 DIAGNOSIS — C642 Malignant neoplasm of left kidney, except renal pelvis: Secondary | ICD-10-CM | POA: Insufficient documentation

## 2023-10-13 MED ORDER — GADOBUTROL 1 MMOL/ML IV SOLN
10.0000 mL | Freq: Once | INTRAVENOUS | Status: AC | PRN
  Administered 2023-10-13: 10 mL via INTRAVENOUS

## 2023-10-16 ENCOUNTER — Telehealth: Payer: Self-pay

## 2023-10-16 NOTE — Telephone Encounter (Signed)
 Spoke with the patient regarding MRI of the abdomen results. Per Cassie, PA, the small area noted in the kidney does not appear to be cancer-related, and no specific management is needed at this time. Patient voiced understanding.  Patient requested that MRI results be sent to Dr. Vanice at [facility]. Spoke with the reading room, and staff confirmed that Dr. Vanice has access to view the MRI results. A message was sent to him per the patient's request.

## 2023-10-23 NOTE — Progress Notes (Signed)
 [facility] OFFICE PROGRESS NOTE  [name], [place], [name] 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 Devere on January 05, 2020.  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  The final pathological staging was T3aN0 grade 2 tumor.   2) Status post radiation therapy to the pulmonary nodules completed March 2023.  He received 50 Gray in 5 fractions. 3) Status post cryoablation to 2 lesions in the right kidney completed on Aug 23, 2021. 4) status post endoscopic transsphenoidal pituitary resection completed by Dr. Mable on November 08, 2021.  The final pathology showed clear-cell renal cell carcinoma. 5) SRS treatment to pituitary bed completed in October 2023.  He received 5 fractions for a total of 25 Gray  CURRENT THERAPY: Keytruda  200 Mg IV every 3 weeks started Aug 04, 2021 status post 33 cycles with axitinib  5 mg p.o. daily started February 09, 2022.   INTERVAL HISTORY: Marc [name] 68 y.o. male returns to the clinic today for a follow-up visit. He is feeling fairly well today without any concerning complaints. He is currently on treatment with Keytruda  every 3 weeks and Axitinib  p.o. daily.  The patient is tolerating treatment well without any concerning adverse side effects except for diarrhea but he controls this with imodium and denies any new concerns with regard to this.    The patient denies any recent fever, chills, night sweats, unexplained weight loss. He has been trying to be active with walking every day. He denies any appetite changes. He denies any rashes or skin changes. Denies any chest pain, shortness of breath, cough, or  hemoptysis.  Denies any nausea, vomiting, or constipation. Denies any dysuria, malodorous urine, urinary frequency or urgency.  Denies any headache or visual changes.  He is followed by Dr. Buckley and IR. He had a brain MRI which is stable. He sees endocrinology for adrenal insuffiencey. He is here for evaluation and repeat blood work before undergoing cycle #34.   MEDICAL HISTORY: Past Medical History:  Diagnosis Date   Arthritis    Cancer (HCC)    Kidney, Lung, Pituitary   Hyperlipidemia    Hypothyroidism     ALLERGIES:  has no known allergies.  MEDICATIONS:  Current Outpatient Medications  Medication Sig Dispense Refill   Ascorbic Acid  (VITAMIN C PO) Take 1 tablet by mouth every evening.     aspirin  EC 81 MG tablet Take 81 mg by mouth every evening. Swallow whole.     chlorhexidine  (PERIDEX ) 0.12 % solution SMARTSIG:By Mouth     cholecalciferol  (VITAMIN D3) 25 MCG (1000 UNIT) tablet Take 1,000 Units by mouth every evening.     Ferrous Sulfate  (IRON SLOW RELEASE PO) Take 1 tablet by mouth every evening.     Ferrous Sulfate  Dried (SM SLOW RELEASE IRON) 143 (45 Fe) MG TBCR as directed Orally     HYDROcodone -acetaminophen  (NORCO) 10-325 MG tablet Take by mouth.     ibuprofen  (ADVIL ) 200 MG tablet Take 400-600 mg by mouth every 6 (six) hours as needed for moderate pain.     INLYTA  5 MG tablet TAKE 1 TABLET BY MOUTH DAILY 30 tablet 3   levothyroxine  (SYNTHROID ) 88 MCG tablet Take 1  tablet (88 mcg total) by mouth daily. 90 tablet 3   Multiple Vitamin (MULTI VITAMIN) TABS 1 tablet Orally Once a day for 30 day(s)     Multiple Vitamins-Minerals (MULTIVITAMIN WITH MINERALS) tablet Take 1 tablet by mouth every evening.     Omega-3 Fatty Acids (FISH OIL PO) Take 1 capsule by mouth every evening.     ondansetron  (ZOFRAN -ODT) 8 MG disintegrating tablet Take 8 mg by mouth daily.     predniSONE  (DELTASONE ) 5 MG tablet Take 1.5 tablets (7.5 mg total) by mouth daily with breakfast. 150 tablet 3    prochlorperazine  (COMPAZINE ) 10 MG tablet Take 1 tablet (10 mg total) by mouth every 6 (six) hours as needed for nausea or vomiting. 30 tablet 0   rosuvastatin  (CRESTOR ) 40 MG tablet Take 1 tablet (40 mg total) by mouth daily. 90 tablet 3   Testosterone  (ANDROGEL ) 20.25 MG/1.25GM (1.62%) GEL Place 3 Pump onto the skin as directed. 150 g 5   vitamin E  180 MG (400 UNITS) capsule Take 400 Units by mouth every evening.     No current facility-administered medications for this visit.    SURGICAL HISTORY:  Past Surgical History:  Procedure Laterality Date   CRANIOTOMY N/A 11/08/2021   Procedure: ENDOSCOPIC ENDONASAL RESECTION OF SELLAR MASS;  Surgeon: Cheryle Debby LABOR, MD;  Location: [facility] OR;  Service: Neurosurgery;  Laterality: N/A;  RM 20   IR RADIOLOGIST EVAL & MGMT  07/14/2021   IR RADIOLOGIST EVAL & MGMT  07/27/2021   IR RADIOLOGIST EVAL & MGMT  01/30/2022   IR RADIOLOGIST EVAL & MGMT  05/08/2023   RADIOFREQUENCY ABLATION N/A 08/23/2021   Procedure: RENAL CRYO ABLATION;  Surgeon: Vanice Sharper, MD;  Location: [facility] ORS;  Service: Anesthesiology;  Laterality: N/A;   right shoulder rotator cuff repair Right    ROBOT ASSISTED LAPAROSCOPIC NEPHRECTOMY Left 01/05/2020   Procedure: XI ROBOTIC ASSISTED LAPAROSCOPIC RADICAL NEPHRECTOMY;  Surgeon: Devere Lonni Righter, MD;  Location: [facility] ORS;  Service: Urology;  Laterality: Left;   TOTAL HIP ARTHROPLASTY Left 03/31/2020   Procedure: LEFT TOTAL HIP ARTHROPLASTY ANTERIOR APPROACH;  Surgeon: Vernetta Lonni GRADE, MD;  Location: [facility] OR;  Service: Orthopedics;  Laterality: Left;   TRANSPHENOIDAL APPROACH EXPOSURE N/A 11/08/2021   Procedure: TRANSPHENOIDAL APPROACH EXPOSURE;  Surgeon: Mable Lenis, MD;  Location: [facility];  Service: ENT;  Laterality: N/A;    REVIEW OF SYSTEMS:   Constitutional: Negative for appetite change, chills, fatigue, fever and unexpected weight change.  HENT: Negative for mouth sores, nosebleeds, sore throat and trouble swallowing.    Eyes: Negative for eye problems and icterus.  Respiratory: Negative for cough, hemoptysis, shortness of breath and wheezing.   Cardiovascular: Negative for chest pain and leg swelling.  Gastrointestinal: Positive for controlled diarrhea. Negative for abdominal pain, constipation, nausea and vomiting.  Genitourinary: Negative for bladder incontinence, difficulty urinating, dysuria, frequency and hematuria.   Musculoskeletal: Negative for back pain, gait problem, neck pain and neck stiffness.  Skin: Improved rash/dry skin.  Neurological: Negative for dizziness, extremity weakness, gait problem, headaches, light-headedness and seizures.  Hematological: Negative for adenopathy. Does not bruise/bleed easily.  Psychiatric/Behavioral: Negative for confusion, depression and sleep disturbance. The patient is not nervous/anxious.       PHYSICAL EXAMINATION:  Blood pressure (!) 140/92, pulse 62, temperature (!) 97.3 F (36.3 C), temperature source Temporal, resp. rate 13, weight 229 lb 14.4 oz (104.3 kg), SpO2 98%.  ECOG PERFORMANCE STATUS: 1  Physical Exam  Constitutional: Oriented to person, place, and time  and well-developed, well-nourished, and in no distress.  HENT:  Head: Normocephalic and atraumatic.  Mouth/Throat: Oropharynx is clear and moist. No oropharyngeal exudate.  Eyes: Conjunctivae are normal. Right eye exhibits no discharge. Left eye exhibits no discharge. No scleral icterus.  Neck: Normal range of motion. Neck supple.  Cardiovascular: Normal rate, regular rhythm, normal heart sounds and intact distal pulses.   Pulmonary/Chest: Effort normal and breath sounds normal. No respiratory distress. No wheezes. No rales.  Abdominal: Soft. Bowel sounds are normal. Exhibits no distension and no mass. There is no tenderness.  Musculoskeletal: Normal range of motion. Exhibits no edema.  Lymphadenopathy:    No cervical adenopathy.  Neurological: Alert and oriented to person, place, and  time. Exhibits normal muscle tone. Gait normal. Coordination normal.  Skin: Skin is warm and dry. Not diaphoretic. No erythema. No pallor.  Psychiatric: Mood, memory and judgment normal.  Vitals reviewed.    LABORATORY DATA: Lab Results  Component Value Date   WBC 8.3 10/29/2023   HGB 14.0 10/29/2023   HCT 42.8 10/29/2023   MCV 92.0 10/29/2023   PLT 201 10/29/2023      Chemistry      Component Value Date/Time   NA 141 10/08/2023 0951   NA 140 08/09/2022 1014   K 4.0 10/08/2023 0951   CL 107 10/08/2023 0951   CO2 27 10/08/2023 0951   BUN 13 10/08/2023 0951   BUN 18 08/09/2022 1014   CREATININE 1.17 10/08/2023 0951      Component Value Date/Time   CALCIUM  9.0 10/08/2023 0951   ALKPHOS 58 10/08/2023 0951   AST 27 10/08/2023 0951   ALT 55 (H) 10/08/2023 0951   BILITOT 0.4 10/08/2023 0951       RADIOGRAPHIC STUDIES:  MR ABDOMEN WWO CONTRAST Result Date: 10/15/2023 CLINICAL DATA:  Metastatic left renal cell carcinoma.  Follow-up. EXAM: MRI ABDOMEN WITHOUT AND WITH CONTRAST TECHNIQUE: Multiplanar multisequence MR imaging of the abdomen was performed both before and after the administration of intravenous contrast. CONTRAST:  10mL GADAVIST  GADOBUTROL  1 MMOL/ML IV SOLN COMPARISON:  Multiple priors including CT September 02, 2023 FINDINGS: Lower chest: Stable consolidative nodularity right lower lobe on image 9/4. Hepatobiliary: No suspicious hepatic lesion. No significant hepatic steatosis or iron deposition. Pancreas: Stable 8 mm cystic lesion in the pancreatic head on image 27/19. No pancreatic ductal dilation or evidence of acute inflammation. Spleen:  No splenomegaly or focal splenic lesion. Adrenals/Urinary Tract: Left kidney surgically absent. No new suspicious soft tissue enhancement in the nephrectomy bed. Stable cryoablation change in the right upper pole kidney without evidence of local recurrence. Intrinsically T1 hyperintense 13 mm lesion in the interpolar left kidney on image  67/9 demonstrates homogeneous signal without evidence of postcontrast enhancement on subtraction imaging compatible with a benign hemorrhagic/proteinaceous renal cyst. Stomach/Bowel: Colonic diverticulosis. Vascular/Lymphatic: No pathologically enlarged lymph nodes identified. No abdominal aortic aneurysm demonstrated. Other:  None. Musculoskeletal: No suspicious bone lesions identified. IMPRESSION: 1. Stable cryoablation change in the right upper pole kidney without evidence of local recurrence. 2. Left kidney surgically absent. No new suspicious soft tissue enhancement in the nephrectomy bed. 3. 13 mm right interpolar renal lesion is compatible with a hemorrhagic/proteinaceous renal cyst. 4. Stable 8 mm cystic lesion in the pancreatic head, likely a side branch IPMN. Recommend follow up pre and post contrast MRI/MRCP in 1 year. 5. Stable consolidative nodularity in the right lower lobe. Electronically Signed   By: Reyes Holder M.D.   On: 10/15/2023 13:42   MR  BRAIN W WO CONTRAST Result Date: 10/03/2023 CLINICAL DATA:  68 year old male with metastatic renal cell carcinoma. Pituitary metastasis status post surgery and SRS in 2023. Restaging. EXAM: MRI HEAD WITHOUT AND WITH CONTRAST TECHNIQUE: Multiplanar, multiecho pulse sequences of the brain and surrounding structures were obtained without and with intravenous contrast. CONTRAST:  10 mL Vueway  COMPARISON:  Brain MRI 05/30/2023 and earlier. FINDINGS: Brain: Dedicated thin slice pituitary findings described below. Stable cerebral volume. No restricted diffusion to suggest acute infarction. No midline shift, mass effect, evidence of mass lesion, ventriculomegaly, extra-axial collection or acute intracranial hemorrhage. Cervicomedullary junction is within normal limits. Stable gray-white matter differentiation throughout the brain. Scattered small nonspecific mostly subcortical white matter T2 and FLAIR hyperintense foci. Deep gray nuclei, brainstem and cerebellum  appear negative. No chronic cerebral blood products on T2 *. No abnormal gray or white matter enhancement is identified. No abnormal dural thickening is identified. Vascular: Major intracranial vascular flow voids are preserved. Skull and upper cervical spine: Skull base appears stable since last year. Background bone marrow signal within normal limits. Sinuses/Orbits: New left maxillary sinus mucosal thickening, enhancement, retained secretions. Left greater than right anterior ethmoidal sinus mucosal thickening also. Underlying chronic transsphenoidal postoperative changes. Other paranasal sinuses remain well aerated. Mastoids and visible internal auditory structures appear negative. Other: Dedicated thin slice pre and postcontrast pituitary imaging. Chronic intra sella architectural distortion with rightward deviation of the infundibulum. Stable suprasellar cistern. Hypothalamus appears stable and negative. Within the sella turcica stable small volume of heterogeneously enhancing soft tissue since 01/30/2023. No supra sella or cavernous sinus extension is identified. Cavernous sinus enhancement appears stable and within normal limits. IMPRESSION: 1. Stable and satisfactory post treatment appearance of the sella turcica. Architectural distortion and stable residual intra sellar soft tissue. No regional mass effect or invasion identified. 2. No new intracranial abnormality, stable MRI appearance of the brain. 3. New left side frontoethmoidal pattern of obstructive paranasal sinus disease. No complicating features. Electronically Signed   By: VEAR Hurst M.D.   On: 10/03/2023 11:53   CT CHEST ABDOMEN PELVIS WO CONTRAST Result Date: 10/02/2023 CLINICAL DATA:  Kidney cancer.  * Tracking Code: BO * EXAM: CT CHEST, ABDOMEN AND PELVIS WITHOUT CONTRAST TECHNIQUE: Multidetector CT imaging of the chest, abdomen and pelvis was performed following the standard protocol without IV contrast. RADIATION DOSE REDUCTION: This exam  was performed according to the departmental dose-optimization program which includes automated exposure control, adjustment of the mA and/or kV according to patient size and/or use of iterative reconstruction technique. COMPARISON:  07/08/2023 and MR abdomen 07/24/2021. FINDINGS: CT CHEST FINDINGS Cardiovascular: Atherosclerotic calcification of the aorta, aortic valve and coronary arteries. Heart is at the upper limits of normal in size. No pericardial effusion. Mediastinum/Nodes: No pathologically enlarged mediastinal or axillary lymph nodes. Hilar regions are difficult to definitively evaluate without IV contrast. Esophagus is grossly unremarkable. Lungs/Pleura: Post treatment parenchymal retraction and architectural distortion in the posterior segment right upper lobe unchanged. Additional scarring and volume loss in the anterior right lower lobe. Pleural nodule in the inferomedial right hemithorax measures 1.6 x 2.8 cm (6/111), stable. No new pulmonary nodules. No pleural fluid. Airway is unremarkable. Musculoskeletal: Degenerative changes in the spine. No worrisome lytic or sclerotic lesions. CT ABDOMEN PELVIS FINDINGS Hepatobiliary: Liver is unremarkable. Tiny gallstones. No biliary ductal dilatation. Pancreas: Negative. Spleen: Negative. Adrenals/Urinary Tract: Right adrenal gland is unremarkable. Vague 13 mm low-attenuation lesion in the medial interpolar right kidney (2/64), stable. Further characterization is limited due to size and  lack of postcontrast imaging. Scarring in the upper pole right kidney. Left adrenalectomy and left nephrectomy. Right ureter is decompressed. Bladder is grossly unremarkable. Stomach/Bowel: Stomach, small bowel, appendix and colon are unremarkable. Vascular/Lymphatic: Atherosclerotic calcification of the aorta. No pathologically enlarged lymph nodes. Reproductive: Prostate is visualized. Other: No free fluid.  Mesenteries and peritoneum are unremarkable. Musculoskeletal: Left  hip arthroplasty. Degenerative changes in the spine. No worrisome lytic or sclerotic lesions. IMPRESSION: 1. No evidence of disease progression. 2. Stable pleural nodule in the inferomedial right hemithorax. 3. Small low-attenuation lesion in the interpolar right kidney, too small to characterize but stable. Consider repeat MR abdomen without and with contrast in further evaluation, as clinically indicated. 4. Cholelithiasis. 5. Aortic atherosclerosis (ICD10-I70.0). Coronary artery calcification. Electronically Signed   By: Newell Eke M.D.   On: 10/02/2023 13:46     ASSESSMENT/PLAN:  This is a very pleasant 68 year old male diagnosed with renal cell carcinoma.  He was initially diagnosed and 2021.  He was found to have metastatic disease with pulmonary involvement and pituitary metastasis and 2023   The patient underwent robot-assisted laparoscopic left radical nephrectomy by Dr. Devere on 01/05/2020 that showed a 10.7 cm lesion with extension into the perinephric tissue 0 of 4 lymph nodes involved.  The final pathology was T3a, N0, grade 2 tumor.   He then underwent radiation therapy to the pulmonary nodules which was completed in March 2023.   He then underwent cryoablation to 2 lesions in the right kidney which was performed on 08/23/2021   He then underwent SRS treatment to the pituitary bed which was completed in October 2023.   he patient is currently undergoing pembrolizumab  200 mg IV every 3 weeks which is started on 08/04/2021. He is status post 33 cycles.  He is also on Axitnib 5 mg daily which was started in February 09, 2022.   Labs were reviewed.  Recommend that he proceed with cycle #34 today as scheduled.    We will see him back for follow-up visit in 3 weeks for evaluation repeat blood work before undergoing cycle #35    He will continue to use imodium for diarrhea.    The patient was advised to call immediately if he has any concerning symptoms in the interval. The patient  voices understanding of current disease status and treatment options and is in agreement with the current care plan. All questions were answered. The patient knows to call the clinic with any problems, questions or concerns. We can certainly see the patient much sooner if necessary         No orders of the defined types were placed in this encounter.    The total time spent in the appointment was 20-29 minutes  Marc Whitsel L Emanuell Morina, PA-C 10/29/23

## 2023-10-29 ENCOUNTER — Inpatient Hospital Stay (HOSPITAL_BASED_OUTPATIENT_CLINIC_OR_DEPARTMENT_OTHER): Admitting: Physician Assistant

## 2023-10-29 ENCOUNTER — Inpatient Hospital Stay

## 2023-10-29 VITALS — BP 140/92 | HR 62 | Temp 97.3°F | Resp 13 | Wt 229.9 lb

## 2023-10-29 DIAGNOSIS — C642 Malignant neoplasm of left kidney, except renal pelvis: Secondary | ICD-10-CM

## 2023-10-29 DIAGNOSIS — C641 Malignant neoplasm of right kidney, except renal pelvis: Secondary | ICD-10-CM | POA: Diagnosis not present

## 2023-10-29 DIAGNOSIS — Z5112 Encounter for antineoplastic immunotherapy: Secondary | ICD-10-CM

## 2023-10-29 LAB — CBC WITH DIFFERENTIAL (CANCER CENTER ONLY)
Abs Immature Granulocytes: 0.03 K/uL (ref 0.00–0.07)
Basophils Absolute: 0.1 K/uL (ref 0.0–0.1)
Basophils Relative: 1 %
Eosinophils Absolute: 0.2 K/uL (ref 0.0–0.5)
Eosinophils Relative: 3 %
HCT: 42.8 % (ref 39.0–52.0)
Hemoglobin: 14 g/dL (ref 13.0–17.0)
Immature Granulocytes: 0 %
Lymphocytes Relative: 31 %
Lymphs Abs: 2.6 K/uL (ref 0.7–4.0)
MCH: 30.1 pg (ref 26.0–34.0)
MCHC: 32.7 g/dL (ref 30.0–36.0)
MCV: 92 fL (ref 80.0–100.0)
Monocytes Absolute: 0.7 K/uL (ref 0.1–1.0)
Monocytes Relative: 8 %
Neutro Abs: 4.8 K/uL (ref 1.7–7.7)
Neutrophils Relative %: 57 %
Platelet Count: 201 K/uL (ref 150–400)
RBC: 4.65 MIL/uL (ref 4.22–5.81)
RDW: 15.7 % — ABNORMAL HIGH (ref 11.5–15.5)
WBC Count: 8.3 K/uL (ref 4.0–10.5)
nRBC: 0 % (ref 0.0–0.2)

## 2023-10-29 LAB — TSH: TSH: <0.01 u[IU]/mL — ABNORMAL LOW (ref 0.350–4.500)

## 2023-10-29 LAB — CMP (CANCER CENTER ONLY)
ALT: 48 U/L — ABNORMAL HIGH (ref 0–44)
AST: 28 U/L (ref 15–41)
Albumin: 4.1 g/dL (ref 3.5–5.0)
Alkaline Phosphatase: 58 U/L (ref 38–126)
Anion gap: 7 (ref 5–15)
BUN: 16 mg/dL (ref 8–23)
CO2: 28 mmol/L (ref 22–32)
Calcium: 8.8 mg/dL — ABNORMAL LOW (ref 8.9–10.3)
Chloride: 107 mmol/L (ref 98–111)
Creatinine: 1.29 mg/dL — ABNORMAL HIGH (ref 0.61–1.24)
GFR, Estimated: >60 mL/min (ref >60)
Glucose, Bld: 93 mg/dL (ref 70–99)
Potassium: 3.6 mmol/L (ref 3.5–5.1)
Sodium: 142 mmol/L (ref 135–145)
Total Bilirubin: 0.4 mg/dL (ref 0.0–1.2)
Total Protein: 6.9 g/dL (ref 6.5–8.1)

## 2023-10-29 MED ORDER — SODIUM CHLORIDE 0.9 % IV SOLN
200.0000 mg | Freq: Once | INTRAVENOUS | Status: AC
  Administered 2023-10-29: 200 mg via INTRAVENOUS
  Filled 2023-10-29: qty 200

## 2023-10-29 MED ORDER — SODIUM CHLORIDE 0.9 % IV SOLN: Freq: Once | INTRAVENOUS | Status: AC

## 2023-10-29 NOTE — Patient Instructions (Signed)

## 2023-10-30 LAB — T4: T4, Total: 6.6 ug/dL (ref 4.5–12.0)

## 2023-11-11 ENCOUNTER — Other Ambulatory Visit: Payer: Self-pay | Admitting: Interventional Radiology

## 2023-11-11 DIAGNOSIS — N2889 Other specified disorders of kidney and ureter: Secondary | ICD-10-CM

## 2023-11-13 ENCOUNTER — Ambulatory Visit
Admission: RE | Admit: 2023-11-13 | Discharge: 2023-11-13 | Disposition: A | Discharge status: Home / Self Care | Source: Ambulatory Visit | Attending: Interventional Radiology | Admitting: Interventional Radiology

## 2023-11-13 DIAGNOSIS — N2889 Other specified disorders of kidney and ureter: Secondary | ICD-10-CM

## 2023-11-13 HISTORY — PX: IR RADIOLOGIST EVAL & MGMT: IMG5224

## 2023-11-13 NOTE — Progress Notes (Signed)
 Patient ID: Marc [name], male   DOB: 1955-07-20, 68 y.o.   MRN: [id number]       Chief Complaint:  Metastatic renal cell carcinoma  Referring Physician(s): Dr. Gatha  History of Present Illness: Marc [name] is a 68 y.o. male Well-known to our service, being treated for stage IV metastatic renal cell carcinoma.  In review, he is status post left nephrectomy and adrenalectomy.  He has also had SBRT to treat to enlarging pulmonary nodules.  Additionally, he underwent surgery and radiation for a pituitary renal cell carcinoma metastasis.  Surveillance imaging demonstrated enlarging right renal cortical masses.  These were treated with CT-guided cryoablation 07/24/2021.  He continues to undergo immunotherapy with Dr. Gatha.    Overall he is doing very well physically.  No flank or abdominal pain.  No recent illness or fever.  Stable weight and appetite.  He remains active [place].  Recent surveillance MRI of the abdomen shows stable right kidney ablation defects.  No signs of residual or recurrent tumor.  No new renal abnormality.  Left nephrectomy.  No signs of adenopathy or disease progression.  Past Medical History:  Diagnosis Date   Arthritis    Cancer (HCC)    Kidney, Lung, Pituitary   Hyperlipidemia    Hypothyroidism     Past Surgical History:  Procedure Laterality Date   CRANIOTOMY N/A 11/08/2021   Procedure: ENDOSCOPIC ENDONASAL RESECTION OF SELLAR MASS;  Surgeon: Cheryle Debby LABOR, MD;  Location: [facility];  Service: Neurosurgery;  Laterality: N/A;  RM 20   IR RADIOLOGIST EVAL & MGMT  07/14/2021   IR RADIOLOGIST EVAL & MGMT  07/27/2021   IR RADIOLOGIST EVAL & MGMT  01/30/2022   IR RADIOLOGIST EVAL & MGMT  05/08/2023   RADIOFREQUENCY ABLATION N/A 08/23/2021   Procedure: RENAL CRYO ABLATION;  Surgeon: Vanice Sharper, MD;  Location: [facility];  Service: Anesthesiology;  Laterality: N/A;   right shoulder rotator cuff repair Right    ROBOT ASSISTED LAPAROSCOPIC  NEPHRECTOMY Left 01/05/2020   Procedure: XI ROBOTIC ASSISTED LAPAROSCOPIC RADICAL NEPHRECTOMY;  Surgeon: Devere Lonni Righter, MD;  Location: [facility];  Service: Urology;  Laterality: Left;   TOTAL HIP ARTHROPLASTY Left 03/31/2020   Procedure: LEFT TOTAL HIP ARTHROPLASTY ANTERIOR APPROACH;  Surgeon: Vernetta Lonni GRADE, MD;  Location: [facility];  Service: Orthopedics;  Laterality: Left;   TRANSPHENOIDAL APPROACH EXPOSURE N/A 11/08/2021   Procedure: TRANSPHENOIDAL APPROACH EXPOSURE;  Surgeon: Mable Lenis, MD;  Location: [facility];  Service: ENT;  Laterality: N/A;    Allergies: Patient has no known allergies.  Medications: Prior to Admission medications   Medication Sig Start Date End Date Taking? Authorizing Provider  Ascorbic Acid  (VITAMIN C PO) Take 1 tablet by mouth every evening.    [provider]  aspirin  EC 81 MG tablet Take 81 mg by mouth every evening. Swallow whole.    [provider]  chlorhexidine  (PERIDEX ) 0.12 % solution SMARTSIG:By Mouth 11/20/22   [provider]  cholecalciferol  (VITAMIN D3) 25 MCG (1000 UNIT) tablet Take 1,000 Units by mouth every evening.    [provider]  Ferrous Sulfate  (IRON SLOW RELEASE PO) Take 1 tablet by mouth every evening.    [provider]  Ferrous Sulfate  Dried (SM SLOW RELEASE IRON) 143 (45 Fe) MG TBCR as directed Orally    [provider]  HYDROcodone -acetaminophen  (NORCO) 10-325 MG tablet Take by mouth. 11/20/22   [provider]  ibuprofen  (ADVIL ) 200 MG tablet  Take 400-600 mg by mouth every 6 (six) hours as needed for moderate pain.    [provider]  INLYTA  5 MG tablet TAKE 1 TABLET BY MOUTH DAILY 08/28/23   Heilingoetter, Cassandra L, PA-C  levothyroxine  (SYNTHROID ) 88 MCG tablet Take 1 tablet (88 mcg total) by mouth daily. 06/26/23   Shamleffer, Ibtehal Jaralla, MD  Multiple Vitamin (MULTI VITAMIN) TABS 1 tablet Orally Once a day for 30 day(s)    [provider]  Multiple Vitamins-Minerals (MULTIVITAMIN WITH MINERALS) tablet Take 1 tablet by mouth every evening.    [provider]  Omega-3 Fatty Acids (FISH OIL PO) Take 1 capsule by mouth every evening.    [provider]  ondansetron  (ZOFRAN -ODT) 8 MG disintegrating tablet Take 8 mg by mouth daily. 11/20/22   [provider]  predniSONE  (DELTASONE ) 5 MG tablet Take 1.5 tablets (7.5 mg total) by mouth daily with breakfast. 06/26/23   Shamleffer, Ibtehal Jaralla, MD  prochlorperazine  (COMPAZINE ) 10 MG tablet Take 1 tablet (10 mg total) by mouth every 6 (six) hours as needed for nausea or vomiting. 08/31/21   Shadad, Firas N, MD  rosuvastatin  (CRESTOR ) 40 MG tablet Take 1 tablet (40 mg total) by mouth daily. 09/17/23 12/16/23  Madie Jon Garre, PA  Testosterone  (ANDROGEL ) 20.25 MG/1.25GM (1.62%) GEL Place 3 Pump onto the skin as directed. 09/19/23   Shamleffer, Ibtehal Jaralla, MD  vitamin E  180 MG (400 UNITS) capsule Take 400 Units by mouth every evening.    [provider]     No family history on file.  Social History   Socioeconomic History   Marital status: Single    Spouse name: Not on file   Number of children: 2   Years of education: Not on file   Highest education level: Not on file  Occupational History   Not on file  Tobacco Use   Smoking status: Former    Current packs/day: 0.00    Types: Cigarettes    Start date: 04/02/1985    Quit date: 04/03/2015    Years since quitting: 8.6   Smokeless tobacco: Never  Vaping Use   Vaping status: Former   Quit date: 04/16/2021   Substances: Nicotine, Flavoring  Substance and Sexual Activity   Alcohol use: Yes    Comment: occas    Drug use: No   Sexual activity: Not on file  Other Topics Concern   Not on file  Social History Narrative   Not on file   Social Drivers of Health   Financial Resource Strain: Not on file  Food Insecurity: No Food Insecurity (11/01/2022)   Hunger Vital Sign    Worried About  Running Out of Food in the Last Year: Never true    Ran Out of Food in the Last Year: Never true  Transportation Needs: No Transportation Needs (11/01/2022)   PRAPARE - Administrator, Civil Service (Medical): No    Lack of Transportation (Non-Medical): No  Physical Activity: Not on file  Stress: Not on file  Social Connections: Not on file    ECOG Status: 1 - Symptomatic but completely ambulatory  Review of Systems: A 12 point ROS discussed and pertinent positives are indicated in the HPI above.  All other systems are negative.  Review of Systems  Vital Signs: BP 133/81 (BP Location: Left Arm, Patient Position: Sitting, Cuff Size: Normal)   Pulse 62   Temp 97.9 F (36.6 C)   Resp 18   SpO2 95%  Physical Exam Constitutional:      General: He is not in acute distress.    Appearance: He is not diaphoretic.  Eyes:     General: No scleral icterus.    Conjunctiva/sclera: Conjunctivae normal.  Cardiovascular:     Rate and Rhythm: Normal rate and regular rhythm.     Heart sounds: No murmur heard. Pulmonary:     Effort: Pulmonary effort is normal.     Breath sounds: Normal breath sounds.  Abdominal:     General: Bowel sounds are normal.     Palpations: Abdomen is soft.     Tenderness: There is no abdominal tenderness. There is no guarding.  Musculoskeletal:        General: No swelling.  Skin:    General: Skin is warm and dry.     Coloration: Skin is not jaundiced.  Neurological:     General: No focal deficit present.     Mental Status: He is alert. Mental status is at baseline.  Psychiatric:        Mood and Affect: Mood normal.        Thought Content: Thought content normal.          Imaging: No results found.  Labs:  CBC: Recent Labs    08/28/23 0808 09/18/23 0755 10/08/23 0951 10/29/23 1035  WBC 10.2 9.6 10.4 8.3  HGB 14.2 14.3 14.3 14.0  HCT 42.4 43.4 42.6 42.8  PLT 228 216 210 201    COAGS: No results for input(s): INR,  APTT in the last 8760 hours.  BMP: Recent Labs    08/28/23 0808 09/18/23 0755 10/08/23 0951 10/29/23 1035  NA 140 142 141 142  K 3.8 3.8 4.0 3.6  CL 106 107 107 107  CO2 27 27 27 28   GLUCOSE 97 94 91 93  BUN 15 14 13 16   CALCIUM  8.8* 8.8* 9.0 8.8*  CREATININE 1.18 1.24 1.17 1.29*  GFRNONAA >60 >60 >60 >60    LIVER FUNCTION TESTS: Recent Labs    08/28/23 0808 09/18/23 0755 10/08/23 0951 10/29/23 1035  BILITOT 0.4 0.5 0.4 0.4  AST 30 30 27 28   ALT 51* 44 55* 48*  ALKPHOS 54 59 58 58  PROT 6.7 6.7 6.5 6.9  ALBUMIN  4.2 4.2 4.1 4.1      Assessment and Plan:  Nearly 2.5 years status post CT-guided cryoablation of 2 right upper pole neoplasms suspicious for additional renal cell carcinoma versus metastatic disease.  Continue interval surveillance confirms a stable ablation defect in right kidney upper pole.  No Suspicious finding by contrast-enhanced MRI.  No disease progression.  Clinically patient doing very well, remaining on chemo and immunotherapy  Plan: Repeat surveillance MRI in 6 months.  Imaging to be performed in [facility]     Electronically Signed: Ozell Specking 11/13/2023, 10:44 AM   I spent a total of    40 Minutes in face to face in clinical consultation, greater than 50% of which was counseling/coordinating care for This patient with metastatic breast carcinoma s/p cryoablation

## 2023-11-19 ENCOUNTER — Inpatient Hospital Stay: Attending: Oncology

## 2023-11-19 ENCOUNTER — Inpatient Hospital Stay (HOSPITAL_BASED_OUTPATIENT_CLINIC_OR_DEPARTMENT_OTHER): Admitting: Internal Medicine

## 2023-11-19 ENCOUNTER — Inpatient Hospital Stay

## 2023-11-19 VITALS — BP 122/81 | HR 59 | Temp 97.8°F | Resp 16

## 2023-11-19 VITALS — BP 130/84 | HR 66 | Temp 98.0°F | Resp 17 | Ht 69.0 in | Wt 230.0 lb

## 2023-11-19 DIAGNOSIS — C641 Malignant neoplasm of right kidney, except renal pelvis: Secondary | ICD-10-CM | POA: Diagnosis not present

## 2023-11-19 DIAGNOSIS — R197 Diarrhea, unspecified: Secondary | ICD-10-CM | POA: Insufficient documentation

## 2023-11-19 DIAGNOSIS — C642 Malignant neoplasm of left kidney, except renal pelvis: Secondary | ICD-10-CM

## 2023-11-19 DIAGNOSIS — Z5112 Encounter for antineoplastic immunotherapy: Secondary | ICD-10-CM | POA: Insufficient documentation

## 2023-11-19 DIAGNOSIS — C7800 Secondary malignant neoplasm of unspecified lung: Secondary | ICD-10-CM | POA: Insufficient documentation

## 2023-11-19 DIAGNOSIS — Z79620 Long term (current) use of immunosuppressive biologic: Secondary | ICD-10-CM | POA: Diagnosis not present

## 2023-11-19 LAB — CBC WITH DIFFERENTIAL (CANCER CENTER ONLY)
Abs Immature Granulocytes: 0.03 K/uL (ref 0.00–0.07)
Basophils Absolute: 0 K/uL (ref 0.0–0.1)
Basophils Relative: 0 %
Eosinophils Absolute: 0.1 K/uL (ref 0.0–0.5)
Eosinophils Relative: 2 %
HCT: 45.8 % (ref 39.0–52.0)
Hemoglobin: 15.1 g/dL (ref 13.0–17.0)
Immature Granulocytes: 0 %
Lymphocytes Relative: 30 %
Lymphs Abs: 2.7 K/uL (ref 0.7–4.0)
MCH: 30.3 pg (ref 26.0–34.0)
MCHC: 33 g/dL (ref 30.0–36.0)
MCV: 92 fL (ref 80.0–100.0)
Monocytes Absolute: 0.8 K/uL (ref 0.1–1.0)
Monocytes Relative: 9 %
Neutro Abs: 5.3 K/uL (ref 1.7–7.7)
Neutrophils Relative %: 59 %
Platelet Count: 209 K/uL (ref 150–400)
RBC: 4.98 MIL/uL (ref 4.22–5.81)
RDW: 15 % (ref 11.5–15.5)
WBC Count: 8.9 K/uL (ref 4.0–10.5)
nRBC: 0 % (ref 0.0–0.2)

## 2023-11-19 LAB — CMP (CANCER CENTER ONLY)
ALT: 47 U/L — ABNORMAL HIGH (ref 0–44)
AST: 31 U/L (ref 15–41)
Albumin: 4.4 g/dL (ref 3.5–5.0)
Alkaline Phosphatase: 63 U/L (ref 38–126)
Anion gap: 7 (ref 5–15)
BUN: 15 mg/dL (ref 8–23)
CO2: 29 mmol/L (ref 22–32)
Calcium: 8.9 mg/dL (ref 8.9–10.3)
Chloride: 104 mmol/L (ref 98–111)
Creatinine: 1.32 mg/dL — ABNORMAL HIGH (ref 0.61–1.24)
GFR, Estimated: 59 mL/min — ABNORMAL LOW (ref >60)
Glucose, Bld: 85 mg/dL (ref 70–99)
Potassium: 3.8 mmol/L (ref 3.5–5.1)
Sodium: 140 mmol/L (ref 135–145)
Total Bilirubin: 0.5 mg/dL (ref 0.0–1.2)
Total Protein: 7 g/dL (ref 6.5–8.1)

## 2023-11-19 LAB — TSH: TSH: <0.01 u[IU]/mL — ABNORMAL LOW (ref 0.350–4.500)

## 2023-11-19 MED ORDER — SODIUM CHLORIDE 0.9 % IV SOLN: Freq: Once | INTRAVENOUS | Status: AC

## 2023-11-19 MED ORDER — SODIUM CHLORIDE 0.9 % IV SOLN
200.0000 mg | Freq: Once | INTRAVENOUS | Status: AC
  Administered 2023-11-19: 200 mg via INTRAVENOUS
  Filled 2023-11-19: qty 200

## 2023-11-19 MED ORDER — SODIUM CHLORIDE 0.9% FLUSH: 10.0000 mL | INTRAVENOUS | Status: DC | PRN

## 2023-11-19 NOTE — Patient Instructions (Signed)
 [facility] - A DEPT OF [facility]  Discharge Instructions: Thank you for choosing [facility] to provide your oncology and hematology care.   If you have a lab appointment with the Cancer Center, please go directly to the Cancer Center and check in at the registration area.   Wear comfortable clothing and clothing appropriate for easy access to any Portacath or PICC line.   We strive to give you quality time with your provider. You may need to reschedule your appointment if you arrive late (15 or more minutes).  Arriving late affects you and other patients whose appointments are after yours.  Also, if you miss three or more appointments without notifying the office, you may be dismissed from the clinic at the provider's discretion.      For prescription refill requests, have your pharmacy contact our office and allow 72 hours for refills to be completed.    Today you received the following chemotherapy and/or immunotherapy agent: Pembrolizumab (Keytruda)   To help prevent nausea and vomiting after your treatment, we encourage you to take your nausea medication as directed.  BELOW ARE SYMPTOMS THAT SHOULD BE REPORTED IMMEDIATELY: *FEVER GREATER THAN 100.4 F (38 C) OR HIGHER *CHILLS OR SWEATING *NAUSEA AND VOMITING THAT IS NOT CONTROLLED WITH YOUR NAUSEA MEDICATION *UNUSUAL SHORTNESS OF BREATH *UNUSUAL BRUISING OR BLEEDING *URINARY PROBLEMS (pain or burning when urinating, or frequent urination) *BOWEL PROBLEMS (unusual diarrhea, constipation, pain near the anus) TENDERNESS IN MOUTH AND THROAT WITH OR WITHOUT PRESENCE OF ULCERS (sore throat, sores in mouth, or a toothache) UNUSUAL RASH, SWELLING OR PAIN  UNUSUAL VAGINAL DISCHARGE OR ITCHING   Items with * indicate a potential emergency and should be followed up as soon as possible or go to the Emergency Department if any problems should occur.  Please show the CHEMOTHERAPY ALERT CARD or  IMMUNOTHERAPY ALERT CARD at check-in to the Emergency Department and triage nurse.  Should you have questions after your visit or need to cancel or reschedule your appointment, please contact [facility] - A DEPT OF [facility]  Dept: [phone]  and follow the prompts.  Office hours are 8:00 a.m. to 4:30 p.m. Monday - Friday. Please note that voicemails left after 4:00 p.m. may not be returned until the following business day.  We are closed weekends and major holidays. You have access to a nurse at all times for urgent questions. Please call the main number to the clinic Dept: [phone] and follow the prompts.   For any non-urgent questions, you may also contact your provider using MyChart. We now offer e-Visits for anyone 6 and older to request care online for non-urgent symptoms. For details visit [email or web address].   Also download the MyChart app! Go to the app store, search "MyChart", open the app, select , and log in with your MyChart username and password.

## 2023-11-19 NOTE — Progress Notes (Signed)
 Marc [name]:([phone]) [phone]   Fax:([phone]  OFFICE PROGRESS NOTE  [name], [name], PA 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    PRIOR THERAPY: 1) status post robotic assisted laparoscopic left radical nephrectomy by Dr. Devere on January 05, 2020.  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  The final pathological staging was T3aN0 grade 2 tumor.     2) Status post radiation therapy to the pulmonary nodules completed March 2023.  Marc [name] received 50 Gray in 5 fractions.   3) Status post cryoablation to 2 lesions in the right kidney completed on Aug 23, 2021.   4) status post endoscopic transsphenoidal pituitary resection completed by Dr. Mable on November 08, 2021.  The final pathology showed clear-cell renal cell carcinoma.   5) SRS treatment to pituitary bed completed in October 2023.  Marc [name] received 5 fractions for a total of 25 Gray.  CURRENT THERAPY: Keytruda  200 Mg IV every 3 weeks started Aug 04, 2021 status post 34 cycles with axitinib  5 mg p.o. daily started February 09, 2022.  INTERVAL HISTORY: Marc [name] 68 y.o. male returns to the clinic today for follow-up visit. Discussed the use of AI scribe software for clinical note transcription with the patient, who gave verbal consent to proceed.  History of Present Illness Marc [name] is a 68 year old male with stage four clear cell renal cell carcinoma who presents for evaluation before starting cycle number thirty-five of Keytruda  treatment.  Marc [name] has stage four clear cell renal cell carcinoma with pulmonary involvement and metastasis to the pituitary gland, diagnosed in 2023. Marc [name] has been undergoing treatment with Keytruda  200 mg IV  every three weeks and has completed thirty-four cycles. Additionally, Marc [name] has been taking axitinib  5 mg orally daily since November 2023.  Marc [name] experiences daily diarrhea, typically occurring two to three times in succession after meals, particularly after supper. The pattern involves going once, then again ten minutes later, and a third time, after which Marc [name] feels 'pretty good' and 'cleared everything.'  No nausea, vomiting, chest pain, or breathing issues. Marc [name] expresses no new complaints since his last visit and describes his condition as 'normal, as usual.'     MEDICAL HISTORY: Past Medical History:  Diagnosis Date   Arthritis    Cancer (HCC)    Kidney, Lung, Pituitary   Hyperlipidemia    Hypothyroidism     ALLERGIES:  has no known allergies.  MEDICATIONS:  Current Outpatient Medications  Medication Sig Dispense Refill   Ascorbic Acid  (VITAMIN C PO) Take 1 tablet by mouth every evening.     aspirin  EC 81 MG tablet Take 81 mg by mouth every evening. Swallow whole.     chlorhexidine  (PERIDEX ) 0.12 % solution SMARTSIG:By Mouth     cholecalciferol  (VITAMIN D3) 25 MCG (1000 UNIT) tablet Take 1,000 Units by mouth every evening.     Ferrous Sulfate  (IRON SLOW RELEASE PO) Take 1 tablet by mouth every evening.     Ferrous Sulfate  Dried (SM SLOW RELEASE IRON) 143 (45 Fe) MG TBCR as directed Orally     HYDROcodone -acetaminophen  (NORCO) 10-325 MG tablet Take by mouth.     ibuprofen  (ADVIL ) 200 MG tablet Take 400-600 mg by mouth every 6 (  six) hours as needed for moderate pain.     INLYTA  5 MG tablet TAKE 1 TABLET BY MOUTH DAILY 30 tablet 3   levothyroxine  (SYNTHROID ) 88 MCG tablet Take 1 tablet (88 mcg total) by mouth daily. 90 tablet 3   Multiple Vitamin (MULTI VITAMIN) TABS 1 tablet Orally Once a day for 30 day(s)     Multiple Vitamins-Minerals (MULTIVITAMIN WITH MINERALS) tablet Take 1 tablet by mouth every evening.     Omega-3 Fatty Acids (FISH OIL PO) Take 1 capsule by mouth every evening.      ondansetron  (ZOFRAN -ODT) 8 MG disintegrating tablet Take 8 mg by mouth daily.     predniSONE  (DELTASONE ) 5 MG tablet Take 1.5 tablets (7.5 mg total) by mouth daily with breakfast. 150 tablet 3   prochlorperazine  (COMPAZINE ) 10 MG tablet Take 1 tablet (10 mg total) by mouth every 6 (six) hours as needed for nausea or vomiting. 30 tablet 0   rosuvastatin  (CRESTOR ) 40 MG tablet Take 1 tablet (40 mg total) by mouth daily. 90 tablet 3   Testosterone  (ANDROGEL ) 20.25 MG/1.25GM (1.62%) GEL Place 3 Pump onto the skin as directed. 150 g 5   vitamin E  180 MG (400 UNITS) capsule Take 400 Units by mouth every evening.     No current facility-administered medications for this visit.    SURGICAL HISTORY:  Past Surgical History:  Procedure Laterality Date   CRANIOTOMY N/A 11/08/2021   Procedure: ENDOSCOPIC ENDONASAL RESECTION OF SELLAR MASS;  Surgeon: Cheryle Debby LABOR, MD;  Location: [facility];  Service: Neurosurgery;  Laterality: N/A;  RM 20   IR RADIOLOGIST EVAL & MGMT  07/14/2021   IR RADIOLOGIST EVAL & MGMT  07/27/2021   IR RADIOLOGIST EVAL & MGMT  01/30/2022   IR RADIOLOGIST EVAL & MGMT  05/08/2023   IR RADIOLOGIST EVAL & MGMT  11/13/2023   RADIOFREQUENCY ABLATION N/A 08/23/2021   Procedure: RENAL CRYO ABLATION;  Surgeon: Vanice Sharper, MD;  Location: [facility];  Service: Anesthesiology;  Laterality: N/A;   right shoulder rotator cuff repair Right    ROBOT ASSISTED LAPAROSCOPIC NEPHRECTOMY Left 01/05/2020   Procedure: XI ROBOTIC ASSISTED LAPAROSCOPIC RADICAL NEPHRECTOMY;  Surgeon: Devere Lonni Righter, MD;  Location: [facility];  Service: Urology;  Laterality: Left;   TOTAL HIP ARTHROPLASTY Left 03/31/2020   Procedure: LEFT TOTAL HIP ARTHROPLASTY ANTERIOR APPROACH;  Surgeon: Vernetta Lonni GRADE, MD;  Location: [facility];  Service: Orthopedics;  Laterality: Left;   TRANSPHENOIDAL APPROACH EXPOSURE N/A 11/08/2021   Procedure: TRANSPHENOIDAL APPROACH EXPOSURE;  Surgeon: Mable Lenis, MD;  [facility];   Service: ENT;  Laterality: N/A;    REVIEW OF SYSTEMS:  A comprehensive review of systems was negative except for: Gastrointestinal: positive for diarrhea   PHYSICAL EXAMINATION: General appearance: alert, cooperative, and no distress Head: Normocephalic, without obvious abnormality, atraumatic Neck: no adenopathy, no JVD, supple, symmetrical, trachea midline, and thyroid  not enlarged, symmetric, no tenderness/mass/nodules Lymph nodes: Cervical, supraclavicular, and axillary nodes normal. Resp: clear to auscultation bilaterally Back: symmetric, no curvature. ROM normal. No CVA tenderness. Cardio: regular rate and rhythm, S1, S2 normal, no murmur, click, rub or gallop GI: soft, non-tender; bowel sounds normal; no masses,  no organomegaly Extremities: extremities normal, atraumatic, no cyanosis or edema  ECOG PERFORMANCE STATUS: 1 - Symptomatic but completely ambulatory  Blood pressure 130/84, pulse 66, temperature 98 F (36.7 C), temperature source Temporal, resp. rate 17, height 5' 9 (1.753 m), weight 230 lb (104.3 kg), SpO2 99%.  LABORATORY DATA: Lab Results  Component Value Date   WBC 8.9 11/19/2023   HGB 15.1 11/19/2023   HCT 45.8 11/19/2023   MCV 92.0 11/19/2023   PLT 209 11/19/2023      Chemistry      Component Value Date/Time   NA 142 10/29/2023 1035   NA 140 08/09/2022 1014   K 3.6 10/29/2023 1035   CL 107 10/29/2023 1035   CO2 28 10/29/2023 1035   BUN 16 10/29/2023 1035   BUN 18 08/09/2022 1014   CREATININE 1.29 (H) 10/29/2023 1035      Component Value Date/Time   CALCIUM  8.8 (L) 10/29/2023 1035   ALKPHOS 58 10/29/2023 1035   AST 28 10/29/2023 1035   ALT 48 (H) 10/29/2023 1035   BILITOT 0.4 10/29/2023 1035       RADIOGRAPHIC STUDIES: IR Radiologist Eval & Mgmt Result Date: 11/13/2023 : Please refer to the dictated note in [facility] from today for this IR eval and management. Electronically Signed   By: CHRISTELLA.  Shick M.D.   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  The patient was initially diagnosed as stage III and October 2021 status post robotic assisted laparoscopic left radical nephrectomy followed by radiation therapy to the pulmonary nodules in March 2023 followed by cryoablation to 2 lesions in the right kidney completed in May 2023.  Marc [name] is also status post endoscopic transsphenoidal pituitary resection on November 08, 2021.  Marc [name] is also status post SRS to the pituitary bed in October 2023. The patient is currently undergoing treatment with Keytruda  200 Mg IV every 3 weeks status post 34 cycles.  This was started on Aug 04, 2021.  Marc [name] is also on axitinib  5 mg p.o. daily since November 2023.   Marc [name] has been tolerating his treatment well except for the few episodes of diarrhea. Assessment and Plan Assessment & Plan Stage 4 clear cell renal cell carcinoma with pulmonary and pituitary metastases Stage 4 clear cell renal cell carcinoma with pulmonary and pituitary metastases, diagnosed in 2023. Currently undergoing treatment with Keytruda  (pembrolizumab ) 200 mg IV every three weeks and axitinib  5 mg PO daily since November 2023. Completed 34 cycles of Keytruda  and is here for evaluation before starting cycle 35. Treatment appears effective with no indication to stop. Comfortable with continuing treatment, and insurance coverage is confirmed. Clinical trial data supports continued treatment without a defined stopping point. - Administer Keytruda  200 mg IV for cycle 35 - Continue axitinib  5 mg PO daily - Schedule next evaluation in three weeks  Diarrhea secondary to cancer therapy Diarrhea occurring daily, typically two to three times after meals, particularly after supper. Known side effect of current cancer therapy regimen. No other gastrointestinal symptoms such as  nausea or vomiting reported. Marc [name] was advised to call immediately if Marc [name] has any other concerning symptoms in the interval.  The patient voices understanding of current disease status and treatment options and is in agreement with the current care plan.  All questions were answered. The patient knows to call the clinic with any problems, questions or concerns. We can certainly see the patient much sooner if necessary.  The total time spent in the appointment was 20 minutes.  Disclaimer: This note was dictated with voice recognition software. Similar sounding words can inadvertently be transcribed and may not be corrected upon review.

## 2023-11-20 LAB — T4: T4, Total: 6.2 ug/dL (ref 4.5–12.0)

## 2023-12-04 NOTE — Progress Notes (Signed)
 [facility] OFFICE PROGRESS NOTE  [name], [place], [name] 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 Devere on January 05, 2020.  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  The final pathological staging was T3aN0 grade 2 tumor.   2) Status post radiation therapy to the pulmonary nodules completed March 2023.  He received 50 Gray in 5 fractions. 3) Status post cryoablation to 2 lesions in the right kidney completed on Aug 23, 2021. 4) status post endoscopic transsphenoidal pituitary resection completed by Dr. Mable on November 08, 2021.  The final pathology showed clear-cell renal cell carcinoma. 5) SRS treatment to pituitary bed completed in October 2023.  He received 5 fractions for a total of 25 Gray  CURRENT THERAPY:  Keytruda  200 Mg IV every 3 weeks started Aug 04, 2021 status post 35 cycles with axitinib  5 mg p.o. daily started February 09, 2022.   INTERVAL HISTORY: CHRISTOPHERE HILLHOUSE 68 y.o. male returns to the clinic today for a follow-up visit. He is feeling fairly well today without any concerning complaints. He is currently on treatment with Keytruda  every 3 weeks and Axitinib  p.o. daily.  The patient is tolerating treatment well without any concerning adverse side effects except for diarrhea but he controls this with imodium and denies any new concerns with regard to this. It actually has been better recently.   The patient recently hit 2 years of treatment with Keytruda . He would like to continue this when him and Dr. Sherrod had a discussion about this at his last appointment.   He has noted elevated blood pressure readings over  the last three to four visits and is not currently on any antihypertensive medication. At home, his blood pressure has been in the 'high green' and occasionally in the 'low yellow' range, but never in the red.   He also reports occasional dizziness, particularly when bending over or walking, which he attributes to age and possibly elevated blood pressure. No recent fevers, infections, breathing changes, nausea, vomiting, urinary changes, blood in urine, unusual abdominal or back pain, headaches, or vision changes.  He is here for evaluation and repeat blood work before undergoing cycle #36.   MEDICAL HISTORY: Past Medical History:  Diagnosis Date   Arthritis    Cancer (HCC)    Kidney, Lung, Pituitary   Hyperlipidemia    Hypothyroidism     ALLERGIES:  has no known allergies.  MEDICATIONS:  Current Outpatient Medications  Medication Sig Dispense Refill   Ascorbic Acid  (VITAMIN C PO) Take 1 tablet by mouth every evening.     aspirin  EC 81 MG tablet Take 81 mg by mouth every evening. Swallow whole.     chlorhexidine  (PERIDEX ) 0.12 % solution SMARTSIG:By Mouth     cholecalciferol  (VITAMIN D3) 25 MCG (1000 UNIT) tablet Take 1,000 Units by mouth every evening.     Ferrous Sulfate  (IRON SLOW RELEASE PO) Take 1 tablet by mouth every evening.     Ferrous Sulfate  Dried (SM SLOW RELEASE IRON) 143 (45 Fe) MG TBCR as directed Orally     HYDROcodone -acetaminophen  (NORCO) 10-325 MG tablet Take by mouth.     ibuprofen  (ADVIL ) 200 MG tablet Take 400-600  mg by mouth every 6 (six) hours as needed for moderate pain.     INLYTA  5 MG tablet TAKE 1 TABLET BY MOUTH DAILY 30 tablet 3   levothyroxine  (SYNTHROID ) 88 MCG tablet Take 1 tablet (88 mcg total) by mouth daily. 90 tablet 3   Multiple Vitamin (MULTI VITAMIN) TABS 1 tablet Orally Once a day for 30 day(s)     Multiple Vitamins-Minerals (MULTIVITAMIN WITH MINERALS) tablet Take 1 tablet by mouth every evening.     Omega-3 Fatty Acids (FISH OIL PO) Take 1  capsule by mouth every evening.     ondansetron  (ZOFRAN -ODT) 8 MG disintegrating tablet Take 8 mg by mouth daily.     predniSONE  (DELTASONE ) 5 MG tablet Take 1.5 tablets (7.5 mg total) by mouth daily with breakfast. 150 tablet 3   prochlorperazine  (COMPAZINE ) 10 MG tablet Take 1 tablet (10 mg total) by mouth every 6 (six) hours as needed for nausea or vomiting. 30 tablet 0   rosuvastatin  (CRESTOR ) 40 MG tablet Take 1 tablet (40 mg total) by mouth daily. 90 tablet 3   Testosterone  (ANDROGEL ) 20.25 MG/1.25GM (1.62%) GEL Place 3 Pump onto the skin as directed. 150 g 5   vitamin E  180 MG (400 UNITS) capsule Take 400 Units by mouth every evening.     No current facility-administered medications for this visit.    SURGICAL HISTORY:  Past Surgical History:  Procedure Laterality Date   CRANIOTOMY N/A 11/08/2021   Procedure: ENDOSCOPIC ENDONASAL RESECTION OF SELLAR MASS;  Surgeon: Cheryle Debby LABOR, MD;  Location: [facility] OR;  Service: Neurosurgery;  Laterality: N/A;  RM 20   IR RADIOLOGIST EVAL & MGMT  07/14/2021   IR RADIOLOGIST EVAL & MGMT  07/27/2021   IR RADIOLOGIST EVAL & MGMT  01/30/2022   IR RADIOLOGIST EVAL & MGMT  05/08/2023   IR RADIOLOGIST EVAL & MGMT  11/13/2023   RADIOFREQUENCY ABLATION N/A 08/23/2021   Procedure: RENAL CRYO ABLATION;  Surgeon: Vanice Sharper, MD;  Location: [facility] ORS;  Service: Anesthesiology;  Laterality: N/A;   right shoulder rotator cuff repair Right    ROBOT ASSISTED LAPAROSCOPIC NEPHRECTOMY Left 01/05/2020   Procedure: XI ROBOTIC ASSISTED LAPAROSCOPIC RADICAL NEPHRECTOMY;  Surgeon: Devere Lonni Righter, MD;  Location: [facility] ORS;  Service: Urology;  Laterality: Left;   TOTAL HIP ARTHROPLASTY Left 03/31/2020   Procedure: LEFT TOTAL HIP ARTHROPLASTY ANTERIOR APPROACH;  Surgeon: Vernetta Lonni GRADE, MD;  Location: [facility] OR;  Service: Orthopedics;  Laterality: Left;   TRANSPHENOIDAL APPROACH EXPOSURE N/A 11/08/2021   Procedure: TRANSPHENOIDAL APPROACH EXPOSURE;  Surgeon:  Mable Lenis, MD;  Location: [facility] OR;  Service: ENT;  Laterality: N/A;    REVIEW OF SYSTEMS:   Review of Systems  Constitutional: Negative for appetite change, chills, fatigue, fever and unexpected weight change.  HENT: Negative for mouth sores, nosebleeds, sore throat and trouble swallowing.   Eyes: Negative for eye problems and icterus.  Respiratory: Negative for cough, hemoptysis, shortness of breath and wheezing.   Cardiovascular: Negative for chest pain and leg swelling.  Gastrointestinal: Positive for controlled diarrhea (improved compared to prior). Negative for abdominal pain, constipation, nausea and vomiting.  Genitourinary: Negative for bladder incontinence, difficulty urinating, dysuria, frequency and hematuria.   Musculoskeletal: Negative for back pain, gait problem, neck pain and neck stiffness.  Skin: Improved rash/dry skin.  Neurological: Negative for dizziness, extremity weakness, gait problem, headaches, light-headedness and seizures.  Hematological: Negative for adenopathy. Does not bruise/bleed easily.  Psychiatric/Behavioral: Negative for confusion, depression and sleep disturbance. The patient  is not nervous/anxious.       PHYSICAL EXAMINATION:  There were no vitals taken for this visit.  ECOG PERFORMANCE STATUS: 1  Physical Exam  Constitutional: Oriented to person, place, and time and well-developed, well-nourished, and in no distress.  HENT:  Head: Normocephalic and atraumatic.  Mouth/Throat: Oropharynx is clear and moist. No oropharyngeal exudate.  Eyes: Conjunctivae are normal. Right eye exhibits no discharge. Left eye exhibits no discharge. No scleral icterus.  Neck: Normal range of motion. Neck supple.  Cardiovascular: Normal rate, regular rhythm, normal heart sounds and intact distal pulses.   Pulmonary/Chest: Effort normal and breath sounds normal. No respiratory distress. No wheezes. No rales.  Abdominal: Soft. Bowel sounds are normal. Exhibits no  distension and no mass. There is no tenderness.  Musculoskeletal: Normal range of motion. Exhibits no edema.  Lymphadenopathy:    No cervical adenopathy.  Neurological: Alert and oriented to person, place, and time. Exhibits normal muscle tone. Gait normal. Coordination normal.  Skin: Skin is warm and dry. Not diaphoretic. No erythema. No pallor.  Psychiatric: Mood, memory and judgment normal.  Vitals reviewed.    LABORATORY DATA: Lab Results  Component Value Date   WBC 8.9 11/19/2023   HGB 15.1 11/19/2023   HCT 45.8 11/19/2023   MCV 92.0 11/19/2023   PLT 209 11/19/2023      Chemistry      Component Value Date/Time   NA 140 11/19/2023 0941   NA 140 08/09/2022 1014   K 3.8 11/19/2023 0941   CL 104 11/19/2023 0941   CO2 29 11/19/2023 0941   BUN 15 11/19/2023 0941   BUN 18 08/09/2022 1014   CREATININE 1.32 (H) 11/19/2023 0941      Component Value Date/Time   CALCIUM  8.9 11/19/2023 0941   ALKPHOS 63 11/19/2023 0941   AST 31 11/19/2023 0941   ALT 47 (H) 11/19/2023 0941   BILITOT 0.5 11/19/2023 0941       RADIOGRAPHIC STUDIES:  IR Radiologist Eval & Mgmt Result Date: 11/13/2023 : Please refer to the dictated note in [facility] from today for this IR eval and management. Electronically Signed   By: CHRISTELLA.  Shick M.D.   On: 11/13/2023 11:10     ASSESSMENT/PLAN:  This is a very pleasant 68 year old male diagnosed with renal cell carcinoma.  He was initially diagnosed and 2021.  He was found to have metastatic disease with pulmonary involvement and pituitary metastasis and 2023   The patient underwent robot-assisted laparoscopic left radical nephrectomy by Dr. Devere on 01/05/2020 that showed a 10.7 cm lesion with extension into the perinephric tissue 0 of 4 lymph nodes involved.  The final pathology was T3a, N0, grade 2 tumor.   He then underwent radiation therapy to the pulmonary nodules which was completed in March 2023.   He then underwent cryoablation to 2 lesions in the  right kidney which was performed on 08/23/2021   He then underwent SRS treatment to the pituitary bed which was completed in October 2023.   he patient is currently undergoing pembrolizumab  200 mg IV every 3 weeks which is started on 08/04/2021. He is status post 35 cycles.  He is also on Axitnib 5 mg daily which was started in February 09, 2022.   Labs were reviewed.  Recommend that he proceed with cycle #36 today as scheduled.    We will see him back for follow-up visit in 3 weeks for evaluation repeat blood work before undergoing cycle #37    He will  continue to use imodium for diarrhea.   Elevated blood pressure - Monitor blood pressure daily at home and maintain a log. - Consult with family doctor regarding antihypertensive medication. - Discuss lifestyle modifications to manage blood pressure.  The patient was advised to call immediately if he has any concerning symptoms in the interval. The patient voices understanding of current disease status and treatment options and is in agreement with the current care plan. All questions were answered. The patient knows to call the clinic with any problems, questions or concerns. We can certainly see the patient much sooner if necessary'     No orders of the defined types were placed in this encounter.    The total time spent in the appointment was 20-29 minutes  Sadira Standard L Arianny Pun, PA-C 12/04/23

## 2023-12-10 ENCOUNTER — Inpatient Hospital Stay: Attending: Oncology

## 2023-12-10 ENCOUNTER — Inpatient Hospital Stay (HOSPITAL_BASED_OUTPATIENT_CLINIC_OR_DEPARTMENT_OTHER): Admitting: Physician Assistant

## 2023-12-10 VITALS — BP 141/95 | HR 73 | Temp 97.3°F | Resp 15 | Wt 229.8 lb

## 2023-12-10 DIAGNOSIS — C642 Malignant neoplasm of left kidney, except renal pelvis: Secondary | ICD-10-CM | POA: Insufficient documentation

## 2023-12-10 DIAGNOSIS — Z5112 Encounter for antineoplastic immunotherapy: Secondary | ICD-10-CM

## 2023-12-10 DIAGNOSIS — R030 Elevated blood-pressure reading, without diagnosis of hypertension: Secondary | ICD-10-CM | POA: Insufficient documentation

## 2023-12-10 DIAGNOSIS — Z79620 Long term (current) use of immunosuppressive biologic: Secondary | ICD-10-CM | POA: Insufficient documentation

## 2023-12-10 DIAGNOSIS — C641 Malignant neoplasm of right kidney, except renal pelvis: Secondary | ICD-10-CM

## 2023-12-10 DIAGNOSIS — R197 Diarrhea, unspecified: Secondary | ICD-10-CM | POA: Insufficient documentation

## 2023-12-10 LAB — CBC WITH DIFFERENTIAL (CANCER CENTER ONLY)
Abs Immature Granulocytes: 0.03 K/uL (ref 0.00–0.07)
Basophils Absolute: 0.1 K/uL (ref 0.0–0.1)
Basophils Relative: 1 %
Eosinophils Absolute: 0.1 K/uL (ref 0.0–0.5)
Eosinophils Relative: 1 %
HCT: 46 % (ref 39.0–52.0)
Hemoglobin: 15.4 g/dL (ref 13.0–17.0)
Immature Granulocytes: 0 %
Lymphocytes Relative: 29 %
Lymphs Abs: 2.4 K/uL (ref 0.7–4.0)
MCH: 30.4 pg (ref 26.0–34.0)
MCHC: 33.5 g/dL (ref 30.0–36.0)
MCV: 90.9 fL (ref 80.0–100.0)
Monocytes Absolute: 0.7 K/uL (ref 0.1–1.0)
Monocytes Relative: 9 %
Neutro Abs: 5.1 K/uL (ref 1.7–7.7)
Neutrophils Relative %: 60 %
Platelet Count: 207 K/uL (ref 150–400)
RBC: 5.06 MIL/uL (ref 4.22–5.81)
RDW: 15.1 % (ref 11.5–15.5)
WBC Count: 8.4 K/uL (ref 4.0–10.5)
nRBC: 0 % (ref 0.0–0.2)

## 2023-12-10 LAB — CMP (CANCER CENTER ONLY)
ALT: 53 U/L — ABNORMAL HIGH (ref 0–44)
AST: 31 U/L (ref 15–41)
Albumin: 4.4 g/dL (ref 3.5–5.0)
Alkaline Phosphatase: 61 U/L (ref 38–126)
Anion gap: 7 (ref 5–15)
BUN: 18 mg/dL (ref 8–23)
CO2: 30 mmol/L (ref 22–32)
Calcium: 9.1 mg/dL (ref 8.9–10.3)
Chloride: 105 mmol/L (ref 98–111)
Creatinine: 1.34 mg/dL — ABNORMAL HIGH (ref 0.61–1.24)
GFR, Estimated: 58 mL/min — ABNORMAL LOW (ref >60)
Glucose, Bld: 87 mg/dL (ref 70–99)
Potassium: 3.9 mmol/L (ref 3.5–5.1)
Sodium: 142 mmol/L (ref 135–145)
Total Bilirubin: 0.5 mg/dL (ref 0.0–1.2)
Total Protein: 7 g/dL (ref 6.5–8.1)

## 2023-12-10 LAB — TSH: TSH: <0.1 u[IU]/mL — ABNORMAL LOW (ref 0.350–4.500)

## 2023-12-10 MED ORDER — SODIUM CHLORIDE 0.9 % IV SOLN: Freq: Once | INTRAVENOUS | Status: AC

## 2023-12-10 MED ORDER — SODIUM CHLORIDE 0.9 % IV SOLN
200.0000 mg | Freq: Once | INTRAVENOUS | Status: AC
  Administered 2023-12-10: 200 mg via INTRAVENOUS
  Filled 2023-12-10: qty 200

## 2023-12-10 NOTE — Patient Instructions (Signed)

## 2023-12-11 LAB — T4: T4, Total: 6.5 ug/dL (ref 4.5–12.0)

## 2023-12-25 ENCOUNTER — Ambulatory Visit (HOSPITAL_COMMUNITY)
Admission: RE | Admit: 2023-12-25 | Discharge: 2023-12-25 | Disposition: A | Discharge status: Home / Self Care | Source: Ambulatory Visit | Attending: Physician Assistant | Admitting: Physician Assistant

## 2023-12-25 DIAGNOSIS — C641 Malignant neoplasm of right kidney, except renal pelvis: Secondary | ICD-10-CM | POA: Diagnosis present

## 2023-12-30 ENCOUNTER — Other Ambulatory Visit: Payer: Self-pay | Admitting: Physician Assistant

## 2023-12-30 DIAGNOSIS — C642 Malignant neoplasm of left kidney, except renal pelvis: Secondary | ICD-10-CM

## 2024-01-01 ENCOUNTER — Inpatient Hospital Stay: Attending: Oncology

## 2024-01-01 ENCOUNTER — Inpatient Hospital Stay: Admitting: Internal Medicine

## 2024-01-01 ENCOUNTER — Inpatient Hospital Stay

## 2024-01-01 VITALS — BP 132/84 | HR 67 | Temp 97.3°F | Resp 17 | Ht 69.0 in | Wt 232.0 lb

## 2024-01-01 DIAGNOSIS — C7801 Secondary malignant neoplasm of right lung: Secondary | ICD-10-CM | POA: Diagnosis not present

## 2024-01-01 DIAGNOSIS — C642 Malignant neoplasm of left kidney, except renal pelvis: Secondary | ICD-10-CM

## 2024-01-01 DIAGNOSIS — Z79620 Long term (current) use of immunosuppressive biologic: Secondary | ICD-10-CM | POA: Diagnosis not present

## 2024-01-01 DIAGNOSIS — C7802 Secondary malignant neoplasm of left lung: Secondary | ICD-10-CM | POA: Diagnosis not present

## 2024-01-01 DIAGNOSIS — Z5112 Encounter for antineoplastic immunotherapy: Secondary | ICD-10-CM | POA: Insufficient documentation

## 2024-01-01 LAB — CBC WITH DIFFERENTIAL (CANCER CENTER ONLY)
Abs Immature Granulocytes: 0.05 K/uL (ref 0.00–0.07)
Basophils Absolute: 0.1 K/uL (ref 0.0–0.1)
Basophils Relative: 1 %
Eosinophils Absolute: 0.1 K/uL (ref 0.0–0.5)
Eosinophils Relative: 1 %
HCT: 41.5 % (ref 39.0–52.0)
Hemoglobin: 13.9 g/dL (ref 13.0–17.0)
Immature Granulocytes: 1 %
Lymphocytes Relative: 28 %
Lymphs Abs: 2.4 K/uL (ref 0.7–4.0)
MCH: 30 pg (ref 26.0–34.0)
MCHC: 33.5 g/dL (ref 30.0–36.0)
MCV: 89.6 fL (ref 80.0–100.0)
Monocytes Absolute: 0.7 K/uL (ref 0.1–1.0)
Monocytes Relative: 8 %
Neutro Abs: 5.4 K/uL (ref 1.7–7.7)
Neutrophils Relative %: 61 %
Platelet Count: 221 K/uL (ref 150–400)
RBC: 4.63 MIL/uL (ref 4.22–5.81)
RDW: 15.1 % (ref 11.5–15.5)
WBC Count: 8.7 K/uL (ref 4.0–10.5)
nRBC: 0.2 % (ref 0.0–0.2)

## 2024-01-01 LAB — CMP (CANCER CENTER ONLY)
ALT: 51 U/L — ABNORMAL HIGH (ref 0–44)
AST: 32 U/L (ref 15–41)
Albumin: 4 g/dL (ref 3.5–5.0)
Alkaline Phosphatase: 52 U/L (ref 38–126)
Anion gap: 6 (ref 5–15)
BUN: 20 mg/dL (ref 8–23)
CO2: 28 mmol/L (ref 22–32)
Calcium: 8.8 mg/dL — ABNORMAL LOW (ref 8.9–10.3)
Chloride: 106 mmol/L (ref 98–111)
Creatinine: 1.3 mg/dL — ABNORMAL HIGH (ref 0.61–1.24)
GFR, Estimated: >60 mL/min (ref >60)
Glucose, Bld: 84 mg/dL (ref 70–99)
Potassium: 4 mmol/L (ref 3.5–5.1)
Sodium: 140 mmol/L (ref 135–145)
Total Bilirubin: 0.4 mg/dL (ref 0.0–1.2)
Total Protein: 6.4 g/dL — ABNORMAL LOW (ref 6.5–8.1)

## 2024-01-01 LAB — TSH: TSH: <0.1 u[IU]/mL — ABNORMAL LOW (ref 0.350–4.500)

## 2024-01-01 MED ORDER — SODIUM CHLORIDE 0.9 % IV SOLN: Freq: Once | INTRAVENOUS | Status: AC

## 2024-01-01 MED ORDER — SODIUM CHLORIDE 0.9 % IV SOLN
200.0000 mg | Freq: Once | INTRAVENOUS | Status: AC
  Administered 2024-01-01: 200 mg via INTRAVENOUS
  Filled 2024-01-01: qty 200

## 2024-01-01 NOTE — Progress Notes (Signed)
 [facility] [phone]   [phone]) [phone]  OFFICE PROGRESS NOTE  Marc [name], [name], Marc [name] 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    PRIOR THERAPY: 1) status post robotic assisted laparoscopic left radical nephrectomy by Dr. Devere on January 05, 2020.  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  The final pathological staging was T3aN0 grade 2 tumor.     2) Status post radiation therapy to the pulmonary nodules completed March 2023.  He received 50 Gray in 5 fractions.   3) Status post cryoablation to 2 lesions in the right kidney completed on Aug 23, 2021.   4) status post endoscopic transsphenoidal pituitary resection completed by Dr. Mable on November 08, 2021.  The final pathology showed clear-cell renal cell carcinoma.   5) SRS treatment to pituitary bed completed in October 2023.  He received 5 fractions for a total of 25 Gray.  CURRENT THERAPY: Keytruda  200 Mg IV every 3 weeks started Aug 04, 2021 status post 36 cycles with axitinib  5 mg p.o. daily started February 09, 2022.  INTERVAL HISTORY: Marc [name] 68 y.o. male returns to the clinic today for follow-up visit. Discussed the use of AI scribe software for clinical note transcription with the patient, who gave verbal consent to proceed.  History of Present Illness Marc [name] is a 68 year old male with stage four clear cell renal cell carcinoma who presents for evaluation with repeat CT scan for restaging of his disease.  He was diagnosed with stage four clear cell renal cell carcinoma with pulmonary involvement and pituitary metastasis in 2023. He is currently undergoing treatment with pembrolizumab  (Keytruda ) 200 mg IV every  three weeks and has completed 36 cycles. Additionally, he is taking axitinib  5 mg orally daily.  He experiences intermittent diarrhea. No nausea, vomiting, chest pain, or breathing issues.    MEDICAL HISTORY: Past Medical History:  Diagnosis Date   Arthritis    Cancer (HCC)    Kidney, Lung, Pituitary   Hyperlipidemia    Hypothyroidism     ALLERGIES:  has no known allergies.  MEDICATIONS:  Current Outpatient Medications  Medication Sig Dispense Refill   Ascorbic Acid  (VITAMIN C PO) Take 1 tablet by mouth every evening.     aspirin  EC 81 MG tablet Take 81 mg by mouth every evening. Swallow whole.     chlorhexidine  (PERIDEX ) 0.12 % solution SMARTSIG:By Mouth     cholecalciferol  (VITAMIN D3) 25 MCG (1000 UNIT) tablet Take 1,000 Units by mouth every evening.     Ferrous Sulfate  (IRON SLOW RELEASE PO) Take 1 tablet by mouth every evening.     Ferrous Sulfate  Dried (SM SLOW RELEASE IRON) 143 (45 Fe) MG TBCR as directed Orally     HYDROcodone -acetaminophen  (NORCO) 10-325 MG tablet Take by mouth.     ibuprofen  (ADVIL ) 200 MG tablet Take 400-600 mg by mouth every 6 (six) hours as needed for moderate pain.     INLYTA  5 MG tablet TAKE 1 TABLET BY MOUTH DAILY 30 tablet 3   levothyroxine  (SYNTHROID ) 88 MCG tablet Take 1 tablet (88 mcg total) by mouth daily. 90 tablet 3   Multiple Vitamin (MULTI VITAMIN) TABS 1 tablet Orally Once a day for 30 day(s)  Multiple Vitamins-Minerals (MULTIVITAMIN WITH MINERALS) tablet Take 1 tablet by mouth every evening.     Omega-3 Fatty Acids (FISH OIL PO) Take 1 capsule by mouth every evening.     ondansetron  (ZOFRAN -ODT) 8 MG disintegrating tablet Take 8 mg by mouth daily.     predniSONE  (DELTASONE ) 5 MG tablet Take 1.5 tablets (7.5 mg total) by mouth daily with breakfast. 150 tablet 3   prochlorperazine  (COMPAZINE ) 10 MG tablet Take 1 tablet (10 mg total) by mouth every 6 (six) hours as needed for nausea or vomiting. 30 tablet 0   rosuvastatin  (CRESTOR ) 40  MG tablet Take 1 tablet (40 mg total) by mouth daily. 90 tablet 3   Testosterone  (ANDROGEL ) 20.25 MG/1.25GM (1.62%) GEL Place 3 Pump onto the skin as directed. 150 g 5   vitamin E  180 MG (400 UNITS) capsule Take 400 Units by mouth every evening.     No current facility-administered medications for this visit.    SURGICAL HISTORY:  Past Surgical History:  Procedure Laterality Date   CRANIOTOMY N/A 11/08/2021   Procedure: ENDOSCOPIC ENDONASAL RESECTION OF SELLAR MASS;  Surgeon: Cheryle Debby LABOR, MD;  Location: [facility] OR;  Service: Neurosurgery;  Laterality: N/A;  RM 20   IR RADIOLOGIST EVAL & MGMT  07/14/2021   IR RADIOLOGIST EVAL & MGMT  07/27/2021   IR RADIOLOGIST EVAL & MGMT  01/30/2022   IR RADIOLOGIST EVAL & MGMT  05/08/2023   IR RADIOLOGIST EVAL & MGMT  11/13/2023   RADIOFREQUENCY ABLATION N/A 08/23/2021   Procedure: RENAL CRYO ABLATION;  Surgeon: Vanice Sharper, MD;  Location: [facility] ORS;  Service: Anesthesiology;  Laterality: N/A;   right shoulder rotator cuff repair Right    ROBOT ASSISTED LAPAROSCOPIC NEPHRECTOMY Left 01/05/2020   Procedure: XI ROBOTIC ASSISTED LAPAROSCOPIC RADICAL NEPHRECTOMY;  Surgeon: Devere Lonni Righter, MD;  Location: [facility];  Service: Urology;  Laterality: Left;   TOTAL HIP ARTHROPLASTY Left 03/31/2020   Procedure: LEFT TOTAL HIP ARTHROPLASTY ANTERIOR APPROACH;  Surgeon: Vernetta Lonni GRADE, MD;  Location: [facility] OR;  Service: Orthopedics;  Laterality: Left;   TRANSPHENOIDAL APPROACH EXPOSURE N/A 11/08/2021   Procedure: TRANSPHENOIDAL APPROACH EXPOSURE;  Surgeon: Mable Lenis, MD;  Location: [facility] OR;  Service: ENT;  Laterality: N/A;    REVIEW OF SYSTEMS:  Constitutional: negative Eyes: negative Ears, nose, mouth, throat, and face: negative Respiratory: negative Cardiovascular: negative Gastrointestinal: positive for diarrhea Genitourinary:negative Integument/breast: negative Hematologic/lymphatic: negative Musculoskeletal:negative Neurological:  negative Behavioral/Psych: negative Endocrine: negative Allergic/Immunologic: negative   PHYSICAL EXAMINATION: General appearance: alert, cooperative, and no distress Head: Normocephalic, without obvious abnormality, atraumatic Neck: no adenopathy, no JVD, supple, symmetrical, trachea midline, and thyroid  not enlarged, symmetric, no tenderness/mass/nodules Lymph nodes: Cervical, supraclavicular, and axillary nodes normal. Resp: clear to auscultation bilaterally Back: symmetric, no curvature. ROM normal. No CVA tenderness. Cardio: regular rate and rhythm, S1, S2 normal, no murmur, click, rub or gallop GI: soft, non-tender; bowel sounds normal; no masses,  no organomegaly Extremities: extremities normal, atraumatic, no cyanosis or edema Neurologic: Alert and oriented X 3, normal strength and tone. Normal symmetric reflexes. Normal coordination and gait  ECOG PERFORMANCE STATUS: 1 - Symptomatic but completely ambulatory  Blood pressure 132/84, temperature (!) 97.3 F (36.3 C), resp. rate 17, height 5' 9 (1.753 m), weight 232 lb (105.2 kg).  LABORATORY DATA: Lab Results  Component Value Date   WBC 8.7 01/01/2024   HGB 13.9 01/01/2024   HCT 41.5 01/01/2024   MCV 89.6 01/01/2024   PLT 221 01/01/2024  Chemistry      Component Value Date/Time   NA 140 01/01/2024 0919   NA 140 08/09/2022 1014   K 4.0 01/01/2024 0919   CL 106 01/01/2024 0919   CO2 28 01/01/2024 0919   BUN 20 01/01/2024 0919   BUN 18 08/09/2022 1014   CREATININE 1.30 (H) 01/01/2024 0919      Component Value Date/Time   CALCIUM  8.8 (L) 01/01/2024 0919   ALKPHOS 52 01/01/2024 0919   AST 32 01/01/2024 0919   ALT 51 (H) 01/01/2024 0919   BILITOT 0.4 01/01/2024 0919       RADIOGRAPHIC STUDIES: CT CHEST ABDOMEN PELVIS WO CONTRAST Result Date: 12/25/2023 CLINICAL DATA:  Metastatic renal cell carcinoma, follow-up. * Tracking Code: BO * EXAM: CT CHEST, ABDOMEN AND PELVIS WITHOUT CONTRAST TECHNIQUE:  Multidetector CT imaging of the chest, abdomen and pelvis was performed following the standard protocol without IV contrast. RADIATION DOSE REDUCTION: This exam was performed according to the departmental dose-optimization program which includes automated exposure control, adjustment of the mA and/or kV according to patient size and/or use of iterative reconstruction technique. COMPARISON:  Multiple priors including MRI October 13, 2023 and CT October 02, 2023 FINDINGS: CT CHEST FINDINGS Cardiovascular: Aortic atherosclerosis. Coronary artery calcifications. Normal size heart. Mediastinum/Nodes: No suspicious thyroid  nodule. No pathologically enlarged mediastinal, hilar or axillary lymph nodes. Lungs/Pleura: Similar posttreatment retraction architectural distortion in the posterior segment of the right upper lobe and in the anterior right lower lobe. Pleural nodule in the inferior medial right hemithorax measures 2.3 x 1.3 cm on image 122/6 previously 2.8 x 1.6 cm. No new suspicious pulmonary nodules or masses. Musculoskeletal: No aggressive lytic or blastic lesion of bone. CT ABDOMEN PELVIS FINDINGS Hepatobiliary: Unremarkable noncontrast enhanced appearance of the hepatic parenchyma. Cholelithiasis. No biliary ductal dilation. Pancreas: No pancreatic ductal dilation or evidence of acute inflammation. Spleen: No splenomegaly. Adrenals/Urinary Tract: Prior left adrenalectomy and nephrectomy without new suspicious nodularity in the surgical bed. Right adrenal gland appears normal. Similar cryoablation changes in the upper pole right kidney. Vague 11 mm lesion in the interpolar left kidney previously characterized as a benign hemorrhagic/proteinaceous renal cyst on MRI October 13, 2023. Urinary bladder is unremarkable for degree of distension. Stomach/Bowel: Stomach is unremarkable for degree of distension. No pathologic dilation of small or large bowel. Colonic diverticulosis. Vascular/Lymphatic: Aortic atherosclerosis. No  pathologically enlarged abdominal or pelvic lymph nodes. Reproductive: Prostate is unremarkable. Other: No significant abdominopelvic free fluid. Musculoskeletal: No aggressive lytic or blastic lesion of bone. Left hip arthroplasty. Lumbar spondylosis. IMPRESSION: 1. Decreased size of the pleural nodule in the inferior medial right hemithorax. 2. Similar posttreatment changes in the right upper and lower lobes. 3. Prior left adrenalectomy and nephrectomy without new suspicious nodularity in the surgical bed. 4. Similar cryoablation changes in the upper pole right kidney. 5. No evidence of new or progressive disease in the chest, abdomen or pelvis on this noncontrast enhanced examination. 6. Cholelithiasis. 7. Colonic diverticulosis. Electronically Signed   By: Reyes Holder M.D.   On: 12/25/2023 17:48     ASSESSMENT AND PLAN: This is a very pleasant 68 years old white male diagnosed with stage IV clear-cell renal cell carcinoma with pulmonary involvement as well as pituitary metastasis in 2023.  The patient was initially diagnosed as stage III and October 2021 status post robotic assisted laparoscopic left radical nephrectomy followed by radiation therapy to the pulmonary nodules in March 2023 followed by cryoablation to 2 lesions in the right kidney completed in May  2023.  He is also status post endoscopic transsphenoidal pituitary resection on November 08, 2021.  He is also status post SRS to the pituitary bed in October 2023. The patient is currently undergoing treatment with Keytruda  200 Mg IV every 3 weeks status post 34 cycles.  This was started on Aug 04, 2021.  He is also on axitinib  5 mg p.o. daily since November 2023.   He has been tolerating his treatment well except for the few episodes of diarrhea. He had repeat CT scan of the chest, abdomen and pelvis performed recently.  I personally independently reviewed the scan and discussed the result with the patient today.  His scan showed decrease in the  size of the pleural nodule in the inferior medial right hemothorax with no evidence for disease progression. Assessment and Plan Assessment & Plan Stage 4 clear cell renal cell carcinoma with pulmonary and pituitary metastases Stage 4 clear cell renal cell carcinoma with metastases to the lungs and pituitary gland, diagnosed in 2023. Currently undergoing treatment with pembrolizumab  (Keytruda ) 200 mg IV every three weeks and axitinib  5 mg PO daily. The disease is well-managed with some decrease in tumor size on recent CT scan, indicating continued efficacy of the treatment. Discussed potential for tumor regrowth if treatment is stopped and the possibility of using radiation therapy if there is evidence of tumor growth. Radiation therapy could lead to scarring and potential breathing issues, but may be considered if there are only a few spots of tumor remaining. He prefers to continue treatment until he feels it is necessary to stop. - Continue pembrolizumab  (Keytruda ) 200 mg IV every three weeks. - Continue axitinib  5 mg PO daily. - Schedule additional cycles of treatment. - Consider radiation therapy if there is evidence of tumor growth.  Diarrhea secondary to cancer therapy Intermittent diarrhea likely secondary to cancer therapy. The patient was advised to call immediately if he has any other concerning symptoms in the interval. The patient voices understanding of current disease status and treatment options and is in agreement with the current care plan.  All questions were answered. The patient knows to call the clinic with any problems, questions or concerns. We can certainly see the patient much sooner if necessary.  The total time spent in the appointment was 30 minutes.  Disclaimer: This note was dictated with voice recognition software. Similar sounding words can inadvertently be transcribed and may not be corrected upon review.

## 2024-01-01 NOTE — Patient Instructions (Signed)
 [facility] - A DEPT OF [facility]  Discharge Instructions: Thank you for choosing [facility] to provide your oncology and hematology care.   If you have a lab appointment with the Cancer Center, please go directly to the Cancer Center and check in at the registration area.   Wear comfortable clothing and clothing appropriate for easy access to any Portacath or PICC line.   We strive to give you quality time with your provider. You may need to reschedule your appointment if you arrive late (15 or more minutes).  Arriving late affects you and other patients whose appointments are after yours.  Also, if you miss three or more appointments without notifying the office, you may be dismissed from the clinic at the provider's discretion.      For prescription refill requests, have your pharmacy contact our office and allow 72 hours for refills to be completed.    Today you received the following chemotherapy and/or immunotherapy agents :  Pembrolizumab       To help prevent nausea and vomiting after your treatment, we encourage you to take your nausea medication as directed.  BELOW ARE SYMPTOMS THAT SHOULD BE REPORTED IMMEDIATELY: *FEVER GREATER THAN 100.4 F (38 C) OR HIGHER *CHILLS OR SWEATING *NAUSEA AND VOMITING THAT IS NOT CONTROLLED WITH YOUR NAUSEA MEDICATION *UNUSUAL SHORTNESS OF BREATH *UNUSUAL BRUISING OR BLEEDING *URINARY PROBLEMS (pain or burning when urinating, or frequent urination) *BOWEL PROBLEMS (unusual diarrhea, constipation, pain near the anus) TENDERNESS IN MOUTH AND THROAT WITH OR WITHOUT PRESENCE OF ULCERS (sore throat, sores in mouth, or a toothache) UNUSUAL RASH, SWELLING OR PAIN  UNUSUAL VAGINAL DISCHARGE OR ITCHING   Items with * indicate a potential emergency and should be followed up as soon as possible or go to the Emergency Department if any problems should occur.  Please show the CHEMOTHERAPY ALERT CARD or  IMMUNOTHERAPY ALERT CARD at check-in to the Emergency Department and triage nurse.  Should you have questions after your visit or need to cancel or reschedule your appointment, please contact [facility] - A DEPT [facility]  Dept: [phone]  and follow the prompts.  Office hours are 8:00 a.m. to 4:30 p.m. Monday - Friday. Please note that voicemails left after 4:00 p.m. may not be returned until the following business day.  We are closed weekends and major holidays. You have access to a nurse at all times for urgent questions. Please call the main number to the clinic Dept: [phone] and follow the prompts.   For any non-urgent questions, you may also contact your provider using MyChart. We now offer e-Visits for anyone 25 and older to request care online for non-urgent symptoms. For details visit [email or web address].   Also download the MyChart app! Go to the app store, search "MyChart", open the app, select [facility], and log in with your MyChart username and password.

## 2024-01-02 LAB — T4: T4, Total: 4.5 ug/dL (ref 4.5–12.0)

## 2024-01-15 ENCOUNTER — Other Ambulatory Visit: Payer: Self-pay | Admitting: Internal Medicine

## 2024-01-16 NOTE — Progress Notes (Signed)
 [facility] OFFICE PROGRESS NOTE  [name], [place], [name] 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 Devere on January 05, 2020.  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  The final pathological staging was T3aN0 grade 2 tumor.   2) Status post radiation therapy to the pulmonary nodules completed March 2023.  He received 50 Gray in 5 fractions. 3) Status post cryoablation to 2 lesions in the right kidney completed on Aug 23, 2021. 4) status post endoscopic transsphenoidal pituitary resection completed by Dr. Mable on November 08, 2021.  The final pathology showed clear-cell renal cell carcinoma. 5) SRS treatment to pituitary bed completed in October 2023.  He received 5 fractions for a total of 25 Gray  CURRENT THERAPY: Keytruda  200 Mg IV every 3 weeks started Aug 04, 2021 status post 37 cycles with axitinib  5 mg p.o. daily started February 09, 2022.   INTERVAL HISTORY: Marc [name] 68 y.o. male returns to the clinic today for a follow-up visit. He is feeling fairly well today without any concerning complaints. He is currently on treatment with Keytruda  every 3 weeks and Axitinib  p.o. daily.  The patient is tolerating treatment well without any concerning adverse side effects except for diarrhea but he controls this with imodium and denies any new concerns with regard to this.    The patient denies any recent fever, chills, night sweats, unexplained weight loss. He has been trying to be active with walking every day. He denies any appetite changes. He denies any rashes or skin changes. Denies any chest pain, shortness of breath, cough, or  hemoptysis.  Denies any nausea or vomiting. Denies any dysuria, malodorous urine, urinary frequency or urgency.  Denies any headache or visual changes.  He is followed by Dr. Buckley and IR. His next brain MRI is scheduled for 02/06/24. He sees endocrinology for adrenal insuffiencey. He is here for evaluation and repeat blood work before undergoing cycle #38.   MEDICAL HISTORY: Past Medical History:  Diagnosis Date   Arthritis    Cancer (HCC)    Kidney, Lung, Pituitary   Hyperlipidemia    Hypothyroidism     ALLERGIES:  has no known allergies.  MEDICATIONS:  Current Outpatient Medications  Medication Sig Dispense Refill   Ascorbic Acid  (VITAMIN C PO) Take 1 tablet by mouth every evening.     aspirin  EC 81 MG tablet Take 81 mg by mouth every evening. Swallow whole.     chlorhexidine  (PERIDEX ) 0.12 % solution SMARTSIG:By Mouth     cholecalciferol  (VITAMIN D3) 25 MCG (1000 UNIT) tablet Take 1,000 Units by mouth every evening.     Ferrous Sulfate  (IRON SLOW RELEASE PO) Take 1 tablet by mouth every evening.     Ferrous Sulfate  Dried (SM SLOW RELEASE IRON) 143 (45 Fe) MG TBCR as directed Orally     HYDROcodone -acetaminophen  (NORCO) 10-325 MG tablet Take by mouth.     ibuprofen  (ADVIL ) 200 MG tablet Take 400-600 mg by mouth every 6 (six) hours as needed for moderate pain.     INLYTA  5 MG tablet TAKE 1 TABLET BY MOUTH DAILY 30 tablet 3   levothyroxine  (SYNTHROID ) 88 MCG tablet Take 1 tablet (  88 mcg total) by mouth daily. 90 tablet 3   Multiple Vitamin (MULTI VITAMIN) TABS 1 tablet Orally Once a day for 30 day(s)     Multiple Vitamins-Minerals (MULTIVITAMIN WITH MINERALS) tablet Take 1 tablet by mouth every evening.     Omega-3 Fatty Acids (FISH OIL PO) Take 1 capsule by mouth every evening.     ondansetron  (ZOFRAN -ODT) 8 MG disintegrating tablet Take 8 mg by mouth daily.     predniSONE  (DELTASONE ) 5 MG tablet TAKE 1 AND 1/2 TABLETS(7.5 MG) BY MOUTH DAILY WITH BREAKFAST. MAY DOUBLE UP DURING SICK  DAYS 150 tablet 3   prochlorperazine  (COMPAZINE ) 10 MG tablet Take 1 tablet (10 mg total) by mouth every 6 (six) hours as needed for nausea or vomiting. 30 tablet 0   rosuvastatin  (CRESTOR ) 40 MG tablet Take 1 tablet (40 mg total) by mouth daily. 90 tablet 3   Testosterone  (ANDROGEL ) 20.25 MG/1.25GM (1.62%) GEL Place 3 Pump onto the skin as directed. 150 g 5   vitamin E  180 MG (400 UNITS) capsule Take 400 Units by mouth every evening.     No current facility-administered medications for this visit.    SURGICAL HISTORY:  Past Surgical History:  Procedure Laterality Date   CRANIOTOMY N/A 11/08/2021   Procedure: ENDOSCOPIC ENDONASAL RESECTION OF SELLAR MASS;  Surgeon: Cheryle Debby LABOR, MD;  Location: [facility] OR;  Service: Neurosurgery;  Laterality: N/A;  RM 20   IR RADIOLOGIST EVAL & MGMT  07/14/2021   IR RADIOLOGIST EVAL & MGMT  07/27/2021   IR RADIOLOGIST EVAL & MGMT  01/30/2022   IR RADIOLOGIST EVAL & MGMT  05/08/2023   IR RADIOLOGIST EVAL & MGMT  11/13/2023   RADIOFREQUENCY ABLATION N/A 08/23/2021   Procedure: RENAL CRYO ABLATION;  Surgeon: Vanice Sharper, MD;  Location: [facility] ORS;  Service: Anesthesiology;  Laterality: N/A;   right shoulder rotator cuff repair Right    ROBOT ASSISTED LAPAROSCOPIC NEPHRECTOMY Left 01/05/2020   Procedure: XI ROBOTIC ASSISTED LAPAROSCOPIC RADICAL NEPHRECTOMY;  Surgeon: Devere Lonni Righter, MD;  Location: [facility] ORS;  Service: Urology;  Laterality: Left;   TOTAL HIP ARTHROPLASTY Left 03/31/2020   Procedure: LEFT TOTAL HIP ARTHROPLASTY ANTERIOR APPROACH;  Surgeon: Vernetta Lonni GRADE, MD;  Location: [facility] OR;  Service: Orthopedics;  Laterality: Left;   TRANSPHENOIDAL APPROACH EXPOSURE N/A 11/08/2021   Procedure: TRANSPHENOIDAL APPROACH EXPOSURE;  Surgeon: Mable Lenis, MD;  Location: [facility] OR;  Service: ENT;  Laterality: N/A;    REVIEW OF SYSTEMS:   Review of Systems  Constitutional: Negative for appetite change, chills, fatigue, fever and unexpected weight change.   HENT: Negative for mouth sores, nosebleeds, sore throat and trouble swallowing.   Eyes: Negative for eye problems and icterus.  Respiratory: Negative for cough, hemoptysis, shortness of breath and wheezing.   Cardiovascular: Negative for chest pain and leg swelling.  Gastrointestinal: Positive for controlled diarrhea (improved compared to prior). Negative for abdominal pain, nausea and vomiting.  Genitourinary: Negative for bladder incontinence, difficulty urinating, dysuria, frequency and hematuria.   Musculoskeletal: Negative for back pain, gait problem, neck pain and neck stiffness.  Skin: Negative for rash and itching.  Neurological: Negative for dizziness, extremity weakness, gait problem, headaches, light-headedness and seizures.  Hematological: Negative for adenopathy. Does not bruise/bleed easily.  Psychiatric/Behavioral: Negative for confusion, depression and sleep disturbance. The patient is not nervous/anxious.      PHYSICAL EXAMINATION:  Blood pressure (!) 139/97, pulse 61, temperature 98.1 F (36.7 C), temperature source Temporal, resp. rate 13, weight 231 lb 8  oz (105 kg), SpO2 100%.  ECOG PERFORMANCE STATUS: 1  Physical Exam  Constitutional: Oriented to person, place, and time and well-developed, well-nourished, and in no distress.  HENT:  Head: Normocephalic and atraumatic.  Mouth/Throat: Oropharynx is clear and moist. No oropharyngeal exudate.  Eyes: Conjunctivae are normal. Right eye exhibits no discharge. Left eye exhibits no discharge. No scleral icterus.  Neck: Normal range of motion. Neck supple.  Cardiovascular: Normal rate, regular rhythm, normal heart sounds and intact distal pulses.   Pulmonary/Chest: Effort normal and breath sounds normal. No respiratory distress. No wheezes. No rales.  Abdominal: Soft. Bowel sounds are normal. Exhibits no distension and no mass. There is no tenderness.  Musculoskeletal: Normal range of motion. Exhibits no edema.   Lymphadenopathy:    No cervical adenopathy.  Neurological: Alert and oriented to person, place, and time. Exhibits normal muscle tone. Gait normal. Coordination normal.  Skin: Skin is warm and dry. Not diaphoretic. No erythema. No pallor.  Psychiatric: Mood, memory and judgment normal.  Vitals reviewed.  LABORATORY DATA: Lab Results  Component Value Date   WBC 12.2 (H) 01/22/2024   HGB 14.2 01/22/2024   HCT 42.9 01/22/2024   MCV 91.3 01/22/2024   PLT 215 01/22/2024      Chemistry      Component Value Date/Time   NA 141 01/22/2024 1006   NA 140 08/09/2022 1014   K 3.7 01/22/2024 1006   CL 105 01/22/2024 1006   CO2 30 01/22/2024 1006   BUN 21 01/22/2024 1006   BUN 18 08/09/2022 1014   CREATININE 1.32 (H) 01/22/2024 1006      Component Value Date/Time   CALCIUM  9.3 01/22/2024 1006   ALKPHOS 51 01/22/2024 1006   AST 25 01/22/2024 1006   ALT 47 (H) 01/22/2024 1006   BILITOT 0.5 01/22/2024 1006       RADIOGRAPHIC STUDIES:  CT CHEST ABDOMEN PELVIS WO CONTRAST Result Date: 12/25/2023 CLINICAL DATA:  Metastatic renal cell carcinoma, follow-up. * Tracking Code: BO * EXAM: CT CHEST, ABDOMEN AND PELVIS WITHOUT CONTRAST TECHNIQUE: Multidetector CT imaging of the chest, abdomen and pelvis was performed following the standard protocol without IV contrast. RADIATION DOSE REDUCTION: This exam was performed according to the departmental dose-optimization program which includes automated exposure control, adjustment of the mA and/or kV according to patient size and/or use of iterative reconstruction technique. COMPARISON:  Multiple priors including MRI October 13, 2023 and CT October 02, 2023 FINDINGS: CT CHEST FINDINGS Cardiovascular: Aortic atherosclerosis. Coronary artery calcifications. Normal size heart. Mediastinum/Nodes: No suspicious thyroid  nodule. No pathologically enlarged mediastinal, hilar or axillary lymph nodes. Lungs/Pleura: Similar posttreatment retraction architectural distortion  in the posterior segment of the right upper lobe and in the anterior right lower lobe. Pleural nodule in the inferior medial right hemithorax measures 2.3 x 1.3 cm on image 122/6 previously 2.8 x 1.6 cm. No new suspicious pulmonary nodules or masses. Musculoskeletal: No aggressive lytic or blastic lesion of bone. CT ABDOMEN PELVIS FINDINGS Hepatobiliary: Unremarkable noncontrast enhanced appearance of the hepatic parenchyma. Cholelithiasis. No biliary ductal dilation. Pancreas: No pancreatic ductal dilation or evidence of acute inflammation. Spleen: No splenomegaly. Adrenals/Urinary Tract: Prior left adrenalectomy and nephrectomy without new suspicious nodularity in the surgical bed. Right adrenal gland appears normal. Similar cryoablation changes in the upper pole right kidney. Vague 11 mm lesion in the interpolar left kidney previously characterized as a benign hemorrhagic/proteinaceous renal cyst on MRI October 13, 2023. Urinary bladder is unremarkable for degree of distension. Stomach/Bowel: Stomach is unremarkable  for degree of distension. No pathologic dilation of small or large bowel. Colonic diverticulosis. Vascular/Lymphatic: Aortic atherosclerosis. No pathologically enlarged abdominal or pelvic lymph nodes. Reproductive: Prostate is unremarkable. Other: No significant abdominopelvic free fluid. Musculoskeletal: No aggressive lytic or blastic lesion of bone. Left hip arthroplasty. Lumbar spondylosis. IMPRESSION: 1. Decreased size of the pleural nodule in the inferior medial right hemithorax. 2. Similar posttreatment changes in the right upper and lower lobes. 3. Prior left adrenalectomy and nephrectomy without new suspicious nodularity in the surgical bed. 4. Similar cryoablation changes in the upper pole right kidney. 5. No evidence of new or progressive disease in the chest, abdomen or pelvis on this noncontrast enhanced examination. 6. Cholelithiasis. 7. Colonic diverticulosis. Electronically Signed   By:  Reyes Holder M.D.   On: 12/25/2023 17:48     ASSESSMENT/PLAN:  This is a very pleasant 68 year old male diagnosed with renal cell carcinoma.  He was initially diagnosed and 2021.  He was found to have metastatic disease with pulmonary involvement and pituitary metastasis and 2023   The patient underwent robot-assisted laparoscopic left radical nephrectomy by Dr. Devere on 01/05/2020 that showed a 10.7 cm lesion with extension into the perinephric tissue 0 of 4 lymph nodes involved.  The final pathology was T3a, N0, grade 2 tumor.   He then underwent radiation therapy to the pulmonary nodules which was completed in March 2023.   He then underwent cryoablation to 2 lesions in the right kidney which was performed on 08/23/2021   He then underwent SRS treatment to the pituitary bed which was completed in October 2023.   he patient is currently undergoing pembrolizumab  200 mg IV every 3 weeks which is started on 08/04/2021. He is status post 37 cycles.  He is also on Axitnib 5 mg daily which was started in February 09, 2022.    Labs were reviewed.  Recommend that he proceed with cycle #38 today as scheduled.    We will see him back for follow-up visit in 3 weeks for evaluation repeat blood work before undergoing cycle #39    He will continue to use imodium for diarrhea.   His next brain MRI is scheduled for 02/06/24  He is going to monitor his blood pressure at home.   The patient was advised to call immediately if he has any concerning symptoms in the interval. The patient voices understanding of current disease status and treatment options and is in agreement with the current care plan. All questions were answered. The patient knows to call the clinic with any problems, questions or concerns. We can certainly see the patient much sooner if necessary    No orders of the defined types were placed in this encounter.    The total time spent in the appointment was 20-29 minutes  Marc [name]  L Zyia Kaneko, PA-C 01/22/24

## 2024-01-22 ENCOUNTER — Inpatient Hospital Stay

## 2024-01-22 ENCOUNTER — Inpatient Hospital Stay (HOSPITAL_BASED_OUTPATIENT_CLINIC_OR_DEPARTMENT_OTHER): Admitting: Physician Assistant

## 2024-01-22 VITALS — BP 139/97 | HR 61 | Temp 98.1°F | Resp 13 | Wt 231.5 lb

## 2024-01-22 DIAGNOSIS — Z5112 Encounter for antineoplastic immunotherapy: Secondary | ICD-10-CM | POA: Diagnosis not present

## 2024-01-22 DIAGNOSIS — C642 Malignant neoplasm of left kidney, except renal pelvis: Secondary | ICD-10-CM

## 2024-01-22 DIAGNOSIS — C641 Malignant neoplasm of right kidney, except renal pelvis: Secondary | ICD-10-CM

## 2024-01-22 LAB — CMP (CANCER CENTER ONLY)
ALT: 47 U/L — ABNORMAL HIGH (ref 0–44)
AST: 25 U/L (ref 15–41)
Albumin: 4.1 g/dL (ref 3.5–5.0)
Alkaline Phosphatase: 51 U/L (ref 38–126)
Anion gap: 6 (ref 5–15)
BUN: 21 mg/dL (ref 8–23)
CO2: 30 mmol/L (ref 22–32)
Calcium: 9.3 mg/dL (ref 8.9–10.3)
Chloride: 105 mmol/L (ref 98–111)
Creatinine: 1.32 mg/dL — ABNORMAL HIGH (ref 0.61–1.24)
GFR, Estimated: 59 mL/min — ABNORMAL LOW (ref >60)
Glucose, Bld: 82 mg/dL (ref 70–99)
Potassium: 3.7 mmol/L (ref 3.5–5.1)
Sodium: 141 mmol/L (ref 135–145)
Total Bilirubin: 0.5 mg/dL (ref 0.0–1.2)
Total Protein: 6.7 g/dL (ref 6.5–8.1)

## 2024-01-22 LAB — CBC WITH DIFFERENTIAL (CANCER CENTER ONLY)
Abs Immature Granulocytes: 0.08 K/uL — ABNORMAL HIGH (ref 0.00–0.07)
Basophils Absolute: 0.1 K/uL (ref 0.0–0.1)
Basophils Relative: 1 %
Eosinophils Absolute: 0.1 K/uL (ref 0.0–0.5)
Eosinophils Relative: 1 %
HCT: 42.9 % (ref 39.0–52.0)
Hemoglobin: 14.2 g/dL (ref 13.0–17.0)
Immature Granulocytes: 1 %
Lymphocytes Relative: 27 %
Lymphs Abs: 3.3 K/uL (ref 0.7–4.0)
MCH: 30.2 pg (ref 26.0–34.0)
MCHC: 33.1 g/dL (ref 30.0–36.0)
MCV: 91.3 fL (ref 80.0–100.0)
Monocytes Absolute: 0.9 K/uL (ref 0.1–1.0)
Monocytes Relative: 8 %
Neutro Abs: 7.8 K/uL — ABNORMAL HIGH (ref 1.7–7.7)
Neutrophils Relative %: 62 %
Platelet Count: 215 K/uL (ref 150–400)
RBC: 4.7 MIL/uL (ref 4.22–5.81)
RDW: 15.5 % (ref 11.5–15.5)
WBC Count: 12.2 K/uL — ABNORMAL HIGH (ref 4.0–10.5)
nRBC: 0.2 % (ref 0.0–0.2)

## 2024-01-22 LAB — TSH: TSH: <0.1 u[IU]/mL — ABNORMAL LOW (ref 0.350–4.500)

## 2024-01-22 MED ORDER — SODIUM CHLORIDE 0.9 % IV SOLN
200.0000 mg | Freq: Once | INTRAVENOUS | Status: AC
  Administered 2024-01-22: 200 mg via INTRAVENOUS
  Filled 2024-01-22: qty 200

## 2024-01-22 MED ORDER — SODIUM CHLORIDE 0.9 % IV SOLN: Freq: Once | INTRAVENOUS | Status: AC

## 2024-01-22 NOTE — Patient Instructions (Signed)
 [facility] - A DEPT OF [facility]   Discharge Instructions: Thank you for choosing [facility] to provide your oncology and hematology care.   If you have a lab appointment with the Cancer Center, please go directly to the Cancer Center and check in at the registration area.   Wear comfortable clothing and clothing appropriate for easy access to any Portacath or PICC line.   We strive to give you quality time with your provider. You may need to reschedule your appointment if you arrive late (15 or more minutes).  Arriving late affects you and other patients whose appointments are after yours.  Also, if you miss three or more appointments without notifying the office, you may be dismissed from the clinic at the provider's discretion.      For prescription refill requests, have your pharmacy contact our office and allow 72 hours for refills to be completed.    Today you received the following chemotherapy and/or immunotherapy agents: Pembrolizumab Rande Lawman)      To help prevent nausea and vomiting after your treatment, we encourage you to take your nausea medication as directed.  BELOW ARE SYMPTOMS THAT SHOULD BE REPORTED IMMEDIATELY: *FEVER GREATER THAN 100.4 F (38 C) OR HIGHER *CHILLS OR SWEATING *NAUSEA AND VOMITING THAT IS NOT CONTROLLED WITH YOUR NAUSEA MEDICATION *UNUSUAL SHORTNESS OF BREATH *UNUSUAL BRUISING OR BLEEDING *URINARY PROBLEMS (pain or burning when urinating, or frequent urination) *BOWEL PROBLEMS (unusual diarrhea, constipation, pain near the anus) TENDERNESS IN MOUTH AND THROAT WITH OR WITHOUT PRESENCE OF ULCERS (sore throat, sores in mouth, or a toothache) UNUSUAL RASH, SWELLING OR PAIN  UNUSUAL VAGINAL DISCHARGE OR ITCHING   Items with * indicate a potential emergency and should be followed up as soon as possible or go to the Emergency Department if any problems should occur.  Please show the CHEMOTHERAPY ALERT CARD  or IMMUNOTHERAPY ALERT CARD at check-in to the Emergency Department and triage nurse.  Should you have questions after your visit or need to cancel or reschedule your appointment, please contact [facility] - A DEPT OF [facility]  Dept: [phone]  and follow the prompts.  Office hours are 8:00 a.m. to 4:30 p.m. Monday - Friday. Please note that voicemails left after 4:00 p.m. may not be returned until the following business day.  We are closed weekends and major holidays. You have access to a nurse at all times for urgent questions. Please call the main number to the clinic Dept: [phone] and follow the prompts.   For any non-urgent questions, you may also contact your provider using MyChart. We now offer e-Visits for anyone 75 and older to request care online for non-urgent symptoms. For details visit [email or web address].   Also download the MyChart app! Go to the app store, search "MyChart", open the app, select , and log in with your MyChart username and password.

## 2024-01-23 LAB — T4: T4, Total: 5.5 ug/dL (ref 4.5–12.0)

## 2024-02-06 ENCOUNTER — Ambulatory Visit
Admission: RE | Admit: 2024-02-06 | Discharge: 2024-02-06 | Disposition: A | Discharge status: Home / Self Care | Source: Ambulatory Visit | Attending: Internal Medicine | Admitting: Internal Medicine

## 2024-02-06 DIAGNOSIS — C7931 Secondary malignant neoplasm of brain: Secondary | ICD-10-CM

## 2024-02-06 MED ORDER — GADOPICLENOL 0.5 MMOL/ML IV SOLN
10.0000 mL | Freq: Once | INTRAVENOUS | Status: AC | PRN
  Administered 2024-02-06: 10 mL via INTRAVENOUS

## 2024-02-10 ENCOUNTER — Inpatient Hospital Stay: Attending: Oncology | Admitting: Internal Medicine

## 2024-02-10 ENCOUNTER — Inpatient Hospital Stay

## 2024-02-10 VITALS — BP 124/85 | HR 73 | Temp 97.7°F | Resp 16 | Ht 69.0 in | Wt 233.0 lb

## 2024-02-10 DIAGNOSIS — C641 Malignant neoplasm of right kidney, except renal pelvis: Secondary | ICD-10-CM | POA: Diagnosis present

## 2024-02-10 DIAGNOSIS — Z79620 Long term (current) use of immunosuppressive biologic: Secondary | ICD-10-CM | POA: Insufficient documentation

## 2024-02-10 DIAGNOSIS — Z7952 Long term (current) use of systemic steroids: Secondary | ICD-10-CM | POA: Diagnosis not present

## 2024-02-10 DIAGNOSIS — C7800 Secondary malignant neoplasm of unspecified lung: Secondary | ICD-10-CM | POA: Insufficient documentation

## 2024-02-10 DIAGNOSIS — Z79890 Hormone replacement therapy (postmenopausal): Secondary | ICD-10-CM | POA: Insufficient documentation

## 2024-02-10 DIAGNOSIS — K529 Noninfective gastroenteritis and colitis, unspecified: Secondary | ICD-10-CM | POA: Insufficient documentation

## 2024-02-10 DIAGNOSIS — C7931 Secondary malignant neoplasm of brain: Secondary | ICD-10-CM | POA: Diagnosis not present

## 2024-02-10 DIAGNOSIS — Z5112 Encounter for antineoplastic immunotherapy: Secondary | ICD-10-CM | POA: Insufficient documentation

## 2024-02-10 NOTE — Progress Notes (Signed)
 [facility] at [facility] [address]  [place], [place] [address] [phone]   Interval Evaluation  Date of Service: 02/10/24 Patient Name: CARMEN VALLECILLO Patient MRN: [id number] Patient DOB: 22-Sep-1955 Provider: Arthea MARLA Manns, MD  Identifying Statement:  PEARSON PICOU is a 68 y.o. 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 [facility])   Primary Cancer:  Oncologic History: Oncology History  Kidney cancer, primary, with metastasis from kidney to other site [facility])  06/01/2021 Initial Diagnosis   Kidney cancer, primary, with metastasis from kidney to other site [facility])   06/01/2021 Cancer Staging   Staging form: Kidney, AJCC 8th Edition - Clinical: Stage IV (cT3a, cN0, cM1) - Signed by Amadeo Windell SAILOR, MD on 06/01/2021   12/05/2021 -  Chemotherapy   Patient is on Treatment Plan : Renal Cell Carcinoma Pembrolizumab  (200) q21d     Renal cell carcinoma of right kidney (HCC)  06/13/2021 Initial Diagnosis   Renal cell carcinoma of right kidney (HCC)   12/05/2021 - 12/05/2021 Chemotherapy   Patient is on Treatment Plan : HEAD/NECK Pembrolizumab  (200) q21d      CNS Oncologic History 11/08/21: Debulking of pituitary mass with Dr. Cheryle, path is renal cell carcinoma 01/22/22: Completes SRS 25/5 with Dr. Patrcia  Interval History: Dempsey GORMAN Jules presents today for follow up after recent MRI.  Denies new or progressive neurologic changes.  Remains on prednisone  7.5mg  daily, also testosterone  supplements through endocrinology.  Continues to follow with Dr. Sherrod for keytruda  infusions.  H+P (02/04/23) Patient presents today for evaluation following MRI brain.  He denies any worsening of his vision.  Gait is independent, no headaches or seizures.  Continues on Keytruda  with Dr. Sherrod for RCC, tolerating well aside from diarrhea.  Medications: Current Outpatient Medications on File Prior to Visit  Medication Sig Dispense Refill   Ascorbic Acid  (VITAMIN C PO) Take 1 tablet by  mouth every evening.     aspirin  EC 81 MG tablet Take 81 mg by mouth every evening. Swallow whole.     chlorhexidine  (PERIDEX ) 0.12 % solution SMARTSIG:By Mouth     cholecalciferol  (VITAMIN D3) 25 MCG (1000 UNIT) tablet Take 1,000 Units by mouth every evening.     Ferrous Sulfate  (IRON SLOW RELEASE PO) Take 1 tablet by mouth every evening.     Ferrous Sulfate  Dried (SM SLOW RELEASE IRON) 143 (45 Fe) MG TBCR as directed Orally     HYDROcodone -acetaminophen  (NORCO) 10-325 MG tablet Take by mouth.     ibuprofen  (ADVIL ) 200 MG tablet Take 400-600 mg by mouth every 6 (six) hours as needed for moderate pain.     INLYTA  5 MG tablet TAKE 1 TABLET BY MOUTH DAILY 30 tablet 3   levothyroxine  (SYNTHROID ) 88 MCG tablet Take 1 tablet (88 mcg total) by mouth daily. 90 tablet 3   Multiple Vitamin (MULTI VITAMIN) TABS 1 tablet Orally Once a day for 30 day(s)     Multiple Vitamins-Minerals (MULTIVITAMIN WITH MINERALS) tablet Take 1 tablet by mouth every evening.     Omega-3 Fatty Acids (FISH OIL PO) Take 1 capsule by mouth every evening.     ondansetron  (ZOFRAN -ODT) 8 MG disintegrating tablet Take 8 mg by mouth daily.     predniSONE  (DELTASONE ) 5 MG tablet TAKE 1 AND 1/2 TABLETS(7.5 MG) BY MOUTH DAILY WITH BREAKFAST. MAY DOUBLE UP DURING SICK DAYS 150 tablet 3   prochlorperazine  (COMPAZINE ) 10 MG tablet Take 1 tablet (10 mg total) by mouth every 6 (six) hours as  needed for nausea or vomiting. 30 tablet 0   rosuvastatin  (CRESTOR ) 40 MG tablet Take 1 tablet (40 mg total) by mouth daily. 90 tablet 3   Testosterone  (ANDROGEL ) 20.25 MG/1.25GM (1.62%) GEL Place 3 Pump onto the skin as directed. 150 g 5   vitamin E  180 MG (400 UNITS) capsule Take 400 Units by mouth every evening.     No current facility-administered medications on file prior to visit.    Allergies: No Known Allergies Past Medical History:  Past Medical History:  Diagnosis Date   Arthritis    Cancer (HCC)    Kidney, Lung, Pituitary    Hyperlipidemia    Hypothyroidism    Past Surgical History:  Past Surgical History:  Procedure Laterality Date   CRANIOTOMY N/A 11/08/2021   Procedure: ENDOSCOPIC ENDONASAL RESECTION OF SELLAR MASS;  Surgeon: Cheryle Debby LABOR, MD;  Location: [facility] OR;  Service: Neurosurgery;  Laterality: N/A;  RM 20   IR RADIOLOGIST EVAL & MGMT  07/14/2021   IR RADIOLOGIST EVAL & MGMT  07/27/2021   IR RADIOLOGIST EVAL & MGMT  01/30/2022   IR RADIOLOGIST EVAL & MGMT  05/08/2023   IR RADIOLOGIST EVAL & MGMT  11/13/2023   RADIOFREQUENCY ABLATION N/A 08/23/2021   Procedure: RENAL CRYO ABLATION;  Surgeon: Vanice Sharper, MD;  Location: [facility] ORS;  Service: Anesthesiology;  Laterality: N/A;   right shoulder rotator cuff repair Right    ROBOT ASSISTED LAPAROSCOPIC NEPHRECTOMY Left 01/05/2020   Procedure: XI ROBOTIC ASSISTED LAPAROSCOPIC RADICAL NEPHRECTOMY;  Surgeon: Devere Lonni Righter, MD;  Location: [facility] ORS;  Service: Urology;  Laterality: Left;   TOTAL HIP ARTHROPLASTY Left 03/31/2020   Procedure: LEFT TOTAL HIP ARTHROPLASTY ANTERIOR APPROACH;  Surgeon: Vernetta Lonni GRADE, MD;  Location: [facility] OR;  Service: Orthopedics;  Laterality: Left;   TRANSPHENOIDAL APPROACH EXPOSURE N/A 11/08/2021   Procedure: TRANSPHENOIDAL APPROACH EXPOSURE;  Surgeon: Mable Lenis, MD;  Location: [facility] OR;  Service: ENT;  Laterality: N/A;   Social History:  Social History   Socioeconomic History   Marital status: Single    Spouse name: Not on file   Number of children: 2   Years of education: Not on file   Highest education level: Not on file  Occupational History   Not on file  Tobacco Use   Smoking status: Former    Current packs/day: 0.00    Types: Cigarettes    Start date: 04/02/1985    Quit date: 04/03/2015    Years since quitting: 8.8   Smokeless tobacco: Never  Vaping Use   Vaping status: Former   Quit date: 04/16/2021   Substances: Nicotine, Flavoring  Substance and Sexual Activity   Alcohol use: Yes    Comment:  occas    Drug use: No   Sexual activity: Not on file  Other Topics Concern   Not on file  Social History Narrative   Not on file   Social Drivers of Health   Financial Resource Strain: Not on file  Food Insecurity: No Food Insecurity (11/01/2022)   Hunger Vital Sign    Worried About Running Out of Food in the Last Year: Never true    Ran Out of Food in the Last Year: Never true  Transportation Needs: No Transportation Needs (11/01/2022)   PRAPARE - Administrator, Civil Service (Medical): No    Lack of Transportation (Non-Medical): No  Physical Activity: Not on file  Stress: Not on file  Social Connections: Not on file  Intimate Partner Violence:  Not At Risk (11/01/2022)   Humiliation, Afraid, Rape, and Kick questionnaire    Fear of Current or Ex-Partner: No    Emotionally Abused: No    Physically Abused: No    Sexually Abused: No   Family History: No family history on file.  Review of Systems: Constitutional: Doesn't report fevers, chills or abnormal weight loss Eyes: Doesn't report blurriness of vision Ears, nose, mouth, throat, and face: Doesn't report sore throat Respiratory: Doesn't report cough, dyspnea or wheezes Cardiovascular: Doesn't report palpitation, chest discomfort  Gastrointestinal:  Doesn't report nausea, constipation, diarrhea GU: Doesn't report incontinence Skin: Doesn't report skin rashes Neurological: Per HPI Musculoskeletal: Doesn't report joint pain Behavioral/Psych: Doesn't report anxiety  Physical Exam: Vitals:   02/10/24 1023  BP: 124/85  Pulse: 73  Resp: 16  Temp: 97.7 F (36.5 C)  SpO2: 98%   KPS: 90. General: Alert, cooperative, pleasant, in no acute distress Head: Normal EENT: No conjunctival injection or scleral icterus.  Lungs: Resp effort normal Cardiac: Regular rate Abdomen: Non-distended abdomen Skin: No rashes cyanosis or petechiae. Extremities: No clubbing or edema  Neurologic Exam: Mental Status: Awake, alert,  attentive to examiner. Oriented to self and environment. Language is fluent with intact comprehension.  Cranial Nerves: Visual acuity is grossly normal. Visual fields are full. Extra-ocular movements intact. No ptosis. Face is symmetric Motor: Tone and bulk are normal. Power is full in both arms and legs. Reflexes are symmetric, no pathologic reflexes present.  Sensory: Intact to light touch Gait: Normal.   Labs: I have reviewed the data as listed    Component Value Date/Time   NA 141 01/22/2024 1006   NA 140 08/09/2022 1014   K 3.7 01/22/2024 1006   CL 105 01/22/2024 1006   CO2 30 01/22/2024 1006   GLUCOSE 82 01/22/2024 1006   BUN 21 01/22/2024 1006   BUN 18 08/09/2022 1014   CREATININE 1.32 (H) 01/22/2024 1006   CALCIUM  9.3 01/22/2024 1006   PROT 6.7 01/22/2024 1006   ALBUMIN  4.1 01/22/2024 1006   AST 25 01/22/2024 1006   ALT 47 (H) 01/22/2024 1006   ALKPHOS 51 01/22/2024 1006   BILITOT 0.5 01/22/2024 1006   GFRNONAA 59 (L) 01/22/2024 1006   GFRNONAA >60 08/06/2023 0000   GFRAA >60 01/05/2020 0550   Lab Results  Component Value Date   WBC 12.2 (H) 01/22/2024   NEUTROABS 7.8 (H) 01/22/2024   HGB 14.2 01/22/2024   HCT 42.9 01/22/2024   MCV 91.3 01/22/2024   PLT 215 01/22/2024    Imaging:  MR BRAIN W WO CONTRAST Result Date: 02/06/2024 EXAM: MRI BRAIN WITH AND WITHOUT CONTRAST 02/06/2024 11:46:50 AM TECHNIQUE: Multiplanar multisequence MRI of the head/brain was performed with and without the administration of intravenous contrast. COMPARISON: MR head without contrast 10/03/2023 and 05/30/2023. CLINICAL HISTORY: Brain/CNS neoplasm, assess treatment response. FINDINGS: BRAIN AND VENTRICLES: No acute infarct. No acute intracranial hemorrhage. No mass effect or midline shift. No hydrocephalus. Postoperative and radiative changes to the sella are stable. Stable enhancement is again noted along the residual pituitary stalk when deviated to the right. No significant residual  recurrent enhancing pituitary tissue is present. Mild periventricular and scattered subcortical T2 hyperintensities are similar to the prior exam. Normal flow voids. ORBITS: No acute abnormality. SINUSES: Mild mucosal thickening is present in the left frontal sinus and bilateral ethmoid air cells. A small left mastoid effusion is present. No obstructing nasopharyngeal lesion is present. BONES AND SOFT TISSUES: Normal bone marrow signal and enhancement. No  acute soft tissue abnormality. IMPRESSION: 1. Stable postoperative and radiative changes to the sella with no significant residual or recurrent enhancing pituitary tissue. 2. Stable mild periventricular and scattered subcortical T2 hyperintensities. 3. Mild mucosal thickening in the left frontal sinus and bilateral ethmoid air cells. Small left mastoid effusion. No obstructing nasopharyngeal lesion. Electronically signed by: Lonni Necessary MD 02/06/2024 12:18 PM EST RP Workstation: [id number]    [facility] Clinician Interpretation: I have personally reviewed the radiological images as listed.  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  No new or progressive neurologic deficits.  We recommended continued MRI surveillance.   We appreciate the opportunity to participate in the care of CLEARENCE VITUG.   We ask that FYNN VANBLARCOM return to clinic in 6 months following next brain MRI, or sooner as needed.    All questions were answered. The patient knows to call the clinic with any problems, questions or concerns. No barriers to learning were detected.  The total time spent in the encounter was 30 minutes and more than 50% was on counseling and review of test results   Arthea MARLA Manns, MD Medical Director of Neuro-Oncology [facility] at [place] 02/10/24 10:25 AM

## 2024-02-12 ENCOUNTER — Inpatient Hospital Stay

## 2024-02-12 ENCOUNTER — Other Ambulatory Visit: Payer: Self-pay

## 2024-02-12 ENCOUNTER — Inpatient Hospital Stay (HOSPITAL_BASED_OUTPATIENT_CLINIC_OR_DEPARTMENT_OTHER): Admitting: Internal Medicine

## 2024-02-12 VITALS — BP 130/82 | HR 66 | Temp 97.4°F | Resp 17 | Ht 69.0 in | Wt 234.0 lb

## 2024-02-12 DIAGNOSIS — C649 Malignant neoplasm of unspecified kidney, except renal pelvis: Secondary | ICD-10-CM

## 2024-02-12 DIAGNOSIS — Z5112 Encounter for antineoplastic immunotherapy: Secondary | ICD-10-CM | POA: Diagnosis not present

## 2024-02-12 DIAGNOSIS — C642 Malignant neoplasm of left kidney, except renal pelvis: Secondary | ICD-10-CM

## 2024-02-12 LAB — CBC WITH DIFFERENTIAL (CANCER CENTER ONLY)
Abs Immature Granulocytes: 0.05 K/uL (ref 0.00–0.07)
Basophils Absolute: 0.1 K/uL (ref 0.0–0.1)
Basophils Relative: 1 %
Eosinophils Absolute: 0.1 K/uL (ref 0.0–0.5)
Eosinophils Relative: 2 %
HCT: 44.6 % (ref 39.0–52.0)
Hemoglobin: 14.7 g/dL (ref 13.0–17.0)
Immature Granulocytes: 1 %
Lymphocytes Relative: 35 %
Lymphs Abs: 2.6 K/uL (ref 0.7–4.0)
MCH: 30.1 pg (ref 26.0–34.0)
MCHC: 33 g/dL (ref 30.0–36.0)
MCV: 91.2 fL (ref 80.0–100.0)
Monocytes Absolute: 0.6 K/uL (ref 0.1–1.0)
Monocytes Relative: 8 %
Neutro Abs: 4 K/uL (ref 1.7–7.7)
Neutrophils Relative %: 53 %
Platelet Count: 223 K/uL (ref 150–400)
RBC: 4.89 MIL/uL (ref 4.22–5.81)
RDW: 15.9 % — ABNORMAL HIGH (ref 11.5–15.5)
WBC Count: 7.5 K/uL (ref 4.0–10.5)
nRBC: 0 % (ref 0.0–0.2)

## 2024-02-12 LAB — CMP (CANCER CENTER ONLY)
ALT: 56 U/L — ABNORMAL HIGH (ref 0–44)
AST: 29 U/L (ref 15–41)
Albumin: 4.2 g/dL (ref 3.5–5.0)
Alkaline Phosphatase: 57 U/L (ref 38–126)
Anion gap: 6 (ref 5–15)
BUN: 20 mg/dL (ref 8–23)
CO2: 31 mmol/L (ref 22–32)
Calcium: 9 mg/dL (ref 8.9–10.3)
Chloride: 104 mmol/L (ref 98–111)
Creatinine: 1.58 mg/dL — ABNORMAL HIGH (ref 0.61–1.24)
GFR, Estimated: 48 mL/min — ABNORMAL LOW (ref >60)
Glucose, Bld: 82 mg/dL (ref 70–99)
Potassium: 3.9 mmol/L (ref 3.5–5.1)
Sodium: 141 mmol/L (ref 135–145)
Total Bilirubin: 0.4 mg/dL (ref 0.0–1.2)
Total Protein: 6.8 g/dL (ref 6.5–8.1)

## 2024-02-12 LAB — TSH: TSH: <0.1 u[IU]/mL — ABNORMAL LOW (ref 0.350–4.500)

## 2024-02-12 MED ORDER — SODIUM CHLORIDE 0.9 % IV SOLN
200.0000 mg | Freq: Once | INTRAVENOUS | Status: AC
  Administered 2024-02-12: 200 mg via INTRAVENOUS
  Filled 2024-02-12: qty 200

## 2024-02-12 MED ORDER — SODIUM CHLORIDE 0.9 % IV SOLN: Freq: Once | INTRAVENOUS | Status: AC

## 2024-02-12 NOTE — Progress Notes (Signed)
 [facility] [phone]   [phone]  OFFICE PROGRESS NOTE  [name], [name], [place] 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    PRIOR THERAPY: 1) status post robotic assisted laparoscopic left radical nephrectomy by Dr. Devere on January 05, 2020.  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  The final pathological staging was T3aN0 grade 2 tumor.     2) Status post radiation therapy to the pulmonary nodules completed March 2023.  He received 50 Gray in 5 fractions.   3) Status post cryoablation to 2 lesions in the right kidney completed on Aug 23, 2021.   4) status post endoscopic transsphenoidal pituitary resection completed by Dr. Mable on November 08, 2021.  The final pathology showed clear-cell renal cell carcinoma.   5) SRS treatment to pituitary bed completed in October 2023.  He received 5 fractions for a total of 25 Gray.  CURRENT THERAPY: Keytruda  200 Mg IV every 3 weeks started Aug 04, 2021 status post 38 cycles with axitinib  5 mg p.o. daily started February 09, 2022.  INTERVAL HISTORY: Marc [name] 68 y.o. male returns to the clinic today for follow-up visit. Discussed the use of AI scribe software for clinical note transcription with the patient, who gave verbal consent to proceed.  History of Present Illness Marc [name] is a 68 year old male with stage four clear cell renal cell carcinoma with pulmonary and pituitary metastasis who presents for evaluation before starting cycle number thirty-nine of treatment.  He was diagnosed with stage four clear cell renal cell carcinoma with pulmonary and pituitary metastasis in 2023. He is currently undergoing treatment with Keytruda   200 mg IV every three weeks and axitinib  5 mg orally daily. He has completed thirty-eight cycles of treatment and is here for evaluation before starting cycle thirty-nine.  He reports no significant changes or problems in the last three weeks. He experiences some diarrhea, which he describes as 'always some diarrhea, but nothing more than normal.' No chest pain or breathing issues.  He recently had an MRI of the brain, which he reports as stable with no new findings.  He plans to host a Thanksgiving gathering at his home with about fifty people, where everyone will bring a dish to share.    MEDICAL HISTORY: Past Medical History:  Diagnosis Date   Arthritis    Cancer (HCC)    Kidney, Lung, Pituitary   Hyperlipidemia    Hypothyroidism     ALLERGIES:  has no known allergies.  MEDICATIONS:  Current Outpatient Medications  Medication Sig Dispense Refill   Ascorbic Acid  (VITAMIN C PO) Take 1 tablet by mouth every evening.     aspirin  EC 81 MG tablet Take 81 mg by mouth every evening. Swallow whole.     chlorhexidine  (PERIDEX ) 0.12 % solution SMARTSIG:By Mouth     cholecalciferol  (VITAMIN D3) 25 MCG (1000 UNIT) tablet Take 1,000 Units by mouth every evening.     Ferrous Sulfate  (IRON SLOW RELEASE PO) Take 1 tablet by mouth every evening.     Ferrous Sulfate  Dried (SM SLOW RELEASE IRON) 143 (45 Fe) MG TBCR as directed Orally     HYDROcodone -acetaminophen  (NORCO) 10-325 MG tablet Take by mouth.  ibuprofen  (ADVIL ) 200 MG tablet Take 400-600 mg by mouth every 6 (six) hours as needed for moderate pain.     INLYTA  5 MG tablet TAKE 1 TABLET BY MOUTH DAILY 30 tablet 3   levothyroxine  (SYNTHROID ) 88 MCG tablet Take 1 tablet (88 mcg total) by mouth daily. 90 tablet 3   Multiple Vitamin (MULTI VITAMIN) TABS 1 tablet Orally Once a day for 30 day(s)     Multiple Vitamins-Minerals (MULTIVITAMIN WITH MINERALS) tablet Take 1 tablet by mouth every evening.     Omega-3 Fatty Acids (FISH OIL PO) Take 1  capsule by mouth every evening.     ondansetron  (ZOFRAN -ODT) 8 MG disintegrating tablet Take 8 mg by mouth daily.     predniSONE  (DELTASONE ) 5 MG tablet TAKE 1 AND 1/2 TABLETS(7.5 MG) BY MOUTH DAILY WITH BREAKFAST. MAY DOUBLE UP DURING SICK DAYS 150 tablet 3   prochlorperazine  (COMPAZINE ) 10 MG tablet Take 1 tablet (10 mg total) by mouth every 6 (six) hours as needed for nausea or vomiting. 30 tablet 0   rosuvastatin  (CRESTOR ) 40 MG tablet Take 1 tablet (40 mg total) by mouth daily. 90 tablet 3   Testosterone  (ANDROGEL ) 20.25 MG/1.25GM (1.62%) GEL Place 3 Pump onto the skin as directed. 150 g 5   vitamin E  180 MG (400 UNITS) capsule Take 400 Units by mouth every evening.     No current facility-administered medications for this visit.    SURGICAL HISTORY:  Past Surgical History:  Procedure Laterality Date   CRANIOTOMY N/A 11/08/2021   Procedure: ENDOSCOPIC ENDONASAL RESECTION OF SELLAR MASS;  Surgeon: Cheryle Debby LABOR, MD;  Location: [facility] OR;  Service: Neurosurgery;  Laterality: N/A;  RM 20   IR RADIOLOGIST EVAL & MGMT  07/14/2021   IR RADIOLOGIST EVAL & MGMT  07/27/2021   IR RADIOLOGIST EVAL & MGMT  01/30/2022   IR RADIOLOGIST EVAL & MGMT  05/08/2023   IR RADIOLOGIST EVAL & MGMT  11/13/2023   RADIOFREQUENCY ABLATION N/A 08/23/2021   Procedure: RENAL CRYO ABLATION;  Surgeon: Vanice Sharper, MD;  Location: [facility];  Service: Anesthesiology;  Laterality: N/A;   right shoulder rotator cuff repair Right    ROBOT ASSISTED LAPAROSCOPIC NEPHRECTOMY Left 01/05/2020   Procedure: XI ROBOTIC ASSISTED LAPAROSCOPIC RADICAL NEPHRECTOMY;  Surgeon: Devere Lonni Righter, MD;  Location: [facility];  Service: Urology;  Laterality: Left;   TOTAL HIP ARTHROPLASTY Left 03/31/2020   Procedure: LEFT TOTAL HIP ARTHROPLASTY ANTERIOR APPROACH;  Surgeon: Vernetta Lonni GRADE, MD;  Location: [facility] OR;  Service: Orthopedics;  Laterality: Left;   TRANSPHENOIDAL APPROACH EXPOSURE N/A 11/08/2021   Procedure: TRANSPHENOIDAL  APPROACH EXPOSURE;  Surgeon: Mable Lenis, MD;  Location: [facility] OR;  Service: ENT;  Laterality: N/A;    REVIEW OF SYSTEMS:  A comprehensive review of systems was negative except for: Gastrointestinal: positive for diarrhea   PHYSICAL EXAMINATION: General appearance: alert, cooperative, and no distress Head: Normocephalic, without obvious abnormality, atraumatic Neck: no adenopathy, no JVD, supple, symmetrical, trachea midline, and thyroid  not enlarged, symmetric, no tenderness/mass/nodules Lymph nodes: Cervical, supraclavicular, and axillary nodes normal. Resp: clear to auscultation bilaterally Back: symmetric, no curvature. ROM normal. No CVA tenderness. Cardio: regular rate and rhythm, S1, S2 normal, no murmur, click, rub or gallop GI: soft, non-tender; bowel sounds normal; no masses,  no organomegaly Extremities: extremities normal, atraumatic, no cyanosis or edema  ECOG PERFORMANCE STATUS: 1 - Symptomatic but completely ambulatory  Blood pressure 130/82, pulse 66, temperature (!) 97.4 F (36.3 C), temperature source Temporal, resp. rate  17, height 5' 9 (1.753 m), weight 234 lb (106.1 kg), SpO2 99%.  LABORATORY DATA: Lab Results  Component Value Date   WBC 12.2 (H) 01/22/2024   HGB 14.2 01/22/2024   HCT 42.9 01/22/2024   MCV 91.3 01/22/2024   PLT 215 01/22/2024      Chemistry      Component Value Date/Time   NA 141 01/22/2024 1006   NA 140 08/09/2022 1014   K 3.7 01/22/2024 1006   CL 105 01/22/2024 1006   CO2 30 01/22/2024 1006   BUN 21 01/22/2024 1006   BUN 18 08/09/2022 1014   CREATININE 1.32 (H) 01/22/2024 1006      Component Value Date/Time   CALCIUM  9.3 01/22/2024 1006   ALKPHOS 51 01/22/2024 1006   AST 25 01/22/2024 1006   ALT 47 (H) 01/22/2024 1006   BILITOT 0.5 01/22/2024 1006       RADIOGRAPHIC STUDIES: MR BRAIN W WO CONTRAST Result Date: 02/06/2024 EXAM: MRI BRAIN WITH AND WITHOUT CONTRAST 02/06/2024 11:46:50 AM TECHNIQUE: Multiplanar multisequence  MRI of the head/brain was performed with and without the administration of intravenous contrast. COMPARISON: MR head without contrast 10/03/2023 and 05/30/2023. CLINICAL HISTORY: Brain/CNS neoplasm, assess treatment response. FINDINGS: BRAIN AND VENTRICLES: No acute infarct. No acute intracranial hemorrhage. No mass effect or midline shift. No hydrocephalus. Postoperative and radiative changes to the sella are stable. Stable enhancement is again noted along the residual pituitary stalk when deviated to the right. No significant residual recurrent enhancing pituitary tissue is present. Mild periventricular and scattered subcortical T2 hyperintensities are similar to the prior exam. Normal flow voids. ORBITS: No acute abnormality. SINUSES: Mild mucosal thickening is present in the left frontal sinus and bilateral ethmoid air cells. A small left mastoid effusion is present. No obstructing nasopharyngeal lesion is present. BONES AND SOFT TISSUES: Normal bone marrow signal and enhancement. No acute soft tissue abnormality. IMPRESSION: 1. Stable postoperative and radiative changes to the sella with no significant residual or recurrent enhancing pituitary tissue. 2. Stable mild periventricular and scattered subcortical T2 hyperintensities. 3. Mild mucosal thickening in the left frontal sinus and bilateral ethmoid air cells. Small left mastoid effusion. No obstructing nasopharyngeal lesion. Electronically signed by: Lonni Necessary MD 02/06/2024 12:18 PM EST RP Workstation: [id number]     ASSESSMENT AND PLAN: This is a very pleasant 68 years old white male diagnosed with stage IV clear-cell renal cell carcinoma with pulmonary involvement as well as pituitary metastasis in 2023.  The patient was initially diagnosed as stage III and October 2021 status post robotic assisted laparoscopic left radical nephrectomy followed by radiation therapy to the pulmonary nodules in March 2023 followed by cryoablation to 2 lesions  in the right kidney completed in May 2023.  He is also status post endoscopic transsphenoidal pituitary resection on November 08, 2021.  He is also status post SRS to the pituitary bed in October 2023. The patient is currently undergoing treatment with Keytruda  200 Mg IV every 3 weeks status post 38 cycles.  This was started on Aug 04, 2021.  He is also on axitinib  5 mg p.o. daily since November 2023.   He has been tolerating his treatment well except for the few episodes of diarrhea. Assessment and Plan Assessment & Plan Stage IV clear cell renal cell carcinoma with pulmonary and pituitary metastases Diagnosed in 2023, currently on treatment with Keytruda  200 mg IV every three weeks and axitinib  5 mg daily. Status post 38 cycles. Recent MRI of the brain shows stable  disease. Blood counts are stable, awaiting chemistry results. No new symptoms reported. - Proceed with cycle number 39 of Keytruda  200 mg IV every three weeks. - Continue axitinib  5 mg daily.  Diarrhea Chronic diarrhea, described as always present but not more than normal. No acute changes or exacerbations reported. - Continue current management as diarrhea is well-managed. He was advised to call immediately if he has any concerning symptoms in the interval.  The patient voices understanding of current disease status and treatment options and is in agreement with the current care plan.  All questions were answered. The patient knows to call the clinic with any problems, questions or concerns. We can certainly see the patient much sooner if necessary.  The total time spent in the appointment was 20 minutes.  Disclaimer: This note was dictated with voice recognition software. Similar sounding words can inadvertently be transcribed and may not be corrected upon review.

## 2024-02-13 ENCOUNTER — Telehealth: Payer: Self-pay | Admitting: Internal Medicine

## 2024-02-13 LAB — T4: T4, Total: 5.7 ug/dL (ref 4.5–12.0)

## 2024-02-13 NOTE — Telephone Encounter (Signed)
 Scheduled patient for next appointment. Called and spoke with the patient, he is aware.

## 2024-02-14 ENCOUNTER — Other Ambulatory Visit: Payer: Self-pay

## 2024-02-24 NOTE — Progress Notes (Signed)
 [facility] OFFICE PROGRESS NOTE  [name], [place], [name] 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 Devere on January 05, 2020.  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  The final pathological staging was T3aN0 grade 2 tumor.   2) Status post radiation therapy to the pulmonary nodules completed March 2023.  He received 50 Gray in 5 fractions. 3) Status post cryoablation to 2 lesions in the right kidney completed on Aug 23, 2021. 4) status post endoscopic transsphenoidal pituitary resection completed by Dr. Mable on November 08, 2021.  The final pathology showed clear-cell renal cell carcinoma. 5) SRS treatment to pituitary bed completed in October 2023.  He received 5 fractions for a total of 25 Gray  CURRENT THERAPY:  Keytruda  200 Mg IV every 3 weeks started Aug 04, 2021 status post 39 cycles with axitinib  5 mg p.o. daily started February 09, 2022.   INTERVAL HISTORY: Marc [name] 68 y.o. male returns to the clinic today for a follow-up visit. He is feeling fairly well today without any concerning complaints. He is currently on treatment with Keytruda  every 3 weeks and Axitinib  p.o. daily.  The patient is tolerating treatment well without any concerning adverse side effects except for diarrhea but he controls this with imodium and denies any new concerns with regard to this.   He has no fevers, chills, or night sweats. He notes a slight weight loss of one pound despite Thanksgiving.  He has developed a chest cold over the past three to four days, initially presenting with significant symptoms but now improved by about 75%. He  attributes the illness to his granddaughter, who had similar symptoms. He has no fever, increased shortness of breath, or chest pain, but does have a cough and sore throat due to coughing. He uses Mucinex  and cough drops, which provide relief within an hour.  He experiences chronic diarrhea, which he manages with Imodium, taking two tablets in the morning and two in the evening as needed. This regimen is effective, although he occasionally has a full day of diarrhea.  He has a history of kidney issues and notes a spike in kidney function tests last month, which he attributes to taking a lot of Advil . He now prefers Tylenol , although he finds it less effective. He occasionally uses leftover Tylenol  3 for pain management, particularly for shoulder pain, and finds it more effective than Advil .  He uses Voltaren  gel for joint pain but is unsure of its effectiveness. He is awaiting an appointment with an orthopedic doctor.  He is here for evaluation and repeat blood work before undergoing cycle #40.  MEDICAL HISTORY: Past Medical History:  Diagnosis Date   Arthritis    Cancer (HCC)    Kidney, Lung, Pituitary   Hyperlipidemia    Hypothyroidism     ALLERGIES:  has no known allergies.  MEDICATIONS:  Current Outpatient Medications  Medication Sig Dispense Refill   Ascorbic Acid  (VITAMIN C PO) Take 1 tablet by mouth every evening.     aspirin  EC 81 MG tablet Take 81 mg by mouth every evening. Swallow whole.     chlorhexidine  (PERIDEX ) 0.12 % solution SMARTSIG:By Mouth  cholecalciferol  (VITAMIN D3) 25 MCG (1000 UNIT) tablet Take 1,000 Units by mouth every evening.     Ferrous Sulfate  (IRON SLOW RELEASE PO) Take 1 tablet by mouth every evening.     Ferrous Sulfate  Dried (SM SLOW RELEASE IRON) 143 (45 Fe) MG TBCR as directed Orally     HYDROcodone -acetaminophen  (NORCO) 10-325 MG tablet Take by mouth.     ibuprofen  (ADVIL ) 200 MG tablet Take 400-600 mg by mouth every 6 (six) hours as needed for  moderate pain.     INLYTA  5 MG tablet TAKE 1 TABLET BY MOUTH DAILY 30 tablet 3   levothyroxine  (SYNTHROID ) 88 MCG tablet Take 1 tablet (88 mcg total) by mouth daily. 90 tablet 3   Multiple Vitamin (MULTI VITAMIN) TABS 1 tablet Orally Once a day for 30 day(s)     Multiple Vitamins-Minerals (MULTIVITAMIN WITH MINERALS) tablet Take 1 tablet by mouth every evening.     Omega-3 Fatty Acids (FISH OIL PO) Take 1 capsule by mouth every evening.     ondansetron  (ZOFRAN -ODT) 8 MG disintegrating tablet Take 8 mg by mouth daily.     predniSONE  (DELTASONE ) 5 MG tablet TAKE 1 AND 1/2 TABLETS(7.5 MG) BY MOUTH DAILY WITH BREAKFAST. MAY DOUBLE UP DURING SICK DAYS 150 tablet 3   prochlorperazine  (COMPAZINE ) 10 MG tablet Take 1 tablet (10 mg total) by mouth every 6 (six) hours as needed for nausea or vomiting. 30 tablet 0   rosuvastatin  (CRESTOR ) 40 MG tablet Take 1 tablet (40 mg total) by mouth daily. 90 tablet 3   Testosterone  (ANDROGEL ) 20.25 MG/1.25GM (1.62%) GEL Place 3 Pump onto the skin as directed. 150 g 5   vitamin E  180 MG (400 UNITS) capsule Take 400 Units by mouth every evening.     No current facility-administered medications for this visit.    SURGICAL HISTORY:  Past Surgical History:  Procedure Laterality Date   CRANIOTOMY N/A 11/08/2021   Procedure: ENDOSCOPIC ENDONASAL RESECTION OF SELLAR MASS;  Surgeon: Cheryle Debby LABOR, MD;  Location: [facility] OR;  Service: Neurosurgery;  Laterality: N/A;  RM 20   IR RADIOLOGIST EVAL & MGMT  07/14/2021   IR RADIOLOGIST EVAL & MGMT  07/27/2021   IR RADIOLOGIST EVAL & MGMT  01/30/2022   IR RADIOLOGIST EVAL & MGMT  05/08/2023   IR RADIOLOGIST EVAL & MGMT  11/13/2023   RADIOFREQUENCY ABLATION N/A 08/23/2021   Procedure: RENAL CRYO ABLATION;  Surgeon: Vanice Sharper, MD;  Location: [facility] ORS;  Service: Anesthesiology;  Laterality: N/A;   right shoulder rotator cuff repair Right    ROBOT ASSISTED LAPAROSCOPIC NEPHRECTOMY Left 01/05/2020   Procedure: XI ROBOTIC ASSISTED  LAPAROSCOPIC RADICAL NEPHRECTOMY;  Surgeon: Devere Lonni Righter, MD;  Location: [facility] ORS;  Service: Urology;  Laterality: Left;   TOTAL HIP ARTHROPLASTY Left 03/31/2020   Procedure: LEFT TOTAL HIP ARTHROPLASTY ANTERIOR APPROACH;  Surgeon: Vernetta Lonni GRADE, MD;  Location: [facility] OR;  Service: Orthopedics;  Laterality: Left;   TRANSPHENOIDAL APPROACH EXPOSURE N/A 11/08/2021   Procedure: TRANSPHENOIDAL APPROACH EXPOSURE;  Surgeon: Mable Lenis, MD;  Location: [facility] OR;  Service: ENT;  Laterality: N/A;    REVIEW OF SYSTEMS:   Review of Systems  Constitutional: Negative for appetite change, chills, fatigue, fever and unexpected weight change.  HENT: Negative for mouth sores, nosebleeds, sore throat and trouble swallowing.   Eyes: Negative for eye problems and icterus.  Respiratory: Negative for cough, hemoptysis, shortness of breath and wheezing.   Cardiovascular: Negative for chest pain and leg swelling.  Gastrointestinal:  Positive for controlled diarrhea. Negative for abdominal pain, nausea and vomiting.  Genitourinary: Negative for bladder incontinence, difficulty urinating, dysuria, frequency and hematuria.   Musculoskeletal: Negative for back pain, gait problem, neck pain and neck stiffness.  Skin: Negative for rash and itching.  Neurological: Negative for dizziness, extremity weakness, gait problem, headaches, light-headedness and seizures.  Hematological: Negative for adenopathy. Does not bruise/bleed easily.  Psychiatric/Behavioral: Negative for confusion, depression and sleep disturbance. The patient is not nervous/anxious.        PHYSICAL EXAMINATION:  Blood pressure 125/76, pulse 83, temperature 98.2 F (36.8 C), temperature source Temporal, resp. rate 18, height 5' 9 (1.753 m), weight 229 lb 14.4 oz (104.3 kg), SpO2 95%.  ECOG PERFORMANCE STATUS: 1  Physical Exam  Constitutional: Oriented to person, place, and time and well-developed, well-nourished, and in no distress.   HENT:  Head: Normocephalic and atraumatic.  Mouth/Throat: Oropharynx is clear and moist. No oropharyngeal exudate.  Eyes: Conjunctivae are normal. Right eye exhibits no discharge. Left eye exhibits no discharge. No scleral icterus.  Neck: Normal range of motion. Neck supple.  Cardiovascular: Normal rate, regular rhythm, normal heart sounds and intact distal pulses.   Pulmonary/Chest: Effort normal and breath sounds normal. No respiratory distress. No wheezes. No rales.  Abdominal: Soft. Bowel sounds are normal. Exhibits no distension and no mass. There is no tenderness.  Musculoskeletal: Normal range of motion. Exhibits no edema.  Lymphadenopathy:    No cervical adenopathy.  Neurological: Alert and oriented to person, place, and time. Exhibits normal muscle tone. Gait normal. Coordination normal.  Skin: Skin is warm and dry. Not diaphoretic. No erythema. No pallor.  Psychiatric: Mood, memory and judgment normal.  Vitals reviewed.  LABORATORY DATA: Lab Results  Component Value Date   WBC 10.3 03/03/2024   HGB 14.9 03/03/2024   HCT 45.7 03/03/2024   MCV 91.2 03/03/2024   PLT 206 03/03/2024      Chemistry      Component Value Date/Time   NA 141 02/12/2024 0817   NA 140 08/09/2022 1014   K 3.9 02/12/2024 0817   CL 104 02/12/2024 0817   CO2 31 02/12/2024 0817   BUN 20 02/12/2024 0817   BUN 18 08/09/2022 1014   CREATININE 1.58 (H) 02/12/2024 0817      Component Value Date/Time   CALCIUM  9.0 02/12/2024 0817   ALKPHOS 57 02/12/2024 0817   AST 29 02/12/2024 0817   ALT 56 (H) 02/12/2024 0817   BILITOT 0.4 02/12/2024 0817       RADIOGRAPHIC STUDIES:  MR BRAIN W WO CONTRAST Result Date: 02/06/2024 EXAM: MRI BRAIN WITH AND WITHOUT CONTRAST 02/06/2024 11:46:50 AM TECHNIQUE: Multiplanar multisequence MRI of the head/brain was performed with and without the administration of intravenous contrast. COMPARISON: MR head without contrast 10/03/2023 and 05/30/2023. CLINICAL HISTORY:  Brain/CNS neoplasm, assess treatment response. FINDINGS: BRAIN AND VENTRICLES: No acute infarct. No acute intracranial hemorrhage. No mass effect or midline shift. No hydrocephalus. Postoperative and radiative changes to the sella are stable. Stable enhancement is again noted along the residual pituitary stalk when deviated to the right. No significant residual recurrent enhancing pituitary tissue is present. Mild periventricular and scattered subcortical T2 hyperintensities are similar to the prior exam. Normal flow voids. ORBITS: No acute abnormality. SINUSES: Mild mucosal thickening is present in the left frontal sinus and bilateral ethmoid air cells. A small left mastoid effusion is present. No obstructing nasopharyngeal lesion is present. BONES AND SOFT TISSUES: Normal bone marrow signal and enhancement. No acute  soft tissue abnormality. IMPRESSION: 1. Stable postoperative and radiative changes to the sella with no significant residual or recurrent enhancing pituitary tissue. 2. Stable mild periventricular and scattered subcortical T2 hyperintensities. 3. Mild mucosal thickening in the left frontal sinus and bilateral ethmoid air cells. Small left mastoid effusion. No obstructing nasopharyngeal lesion. Electronically signed by: Lonni Necessary MD 02/06/2024 12:18 PM EST RP Workstation: [id number]     ASSESSMENT/PLAN:  This is a very pleasant 68 year old male diagnosed with renal cell carcinoma.  He was initially diagnosed and 2021.  He was found to have metastatic disease with pulmonary involvement and pituitary metastasis and 2023   The patient underwent robot-assisted laparoscopic left radical nephrectomy by Dr. Devere on 01/05/2020 that showed a 10.7 cm lesion with extension into the perinephric tissue 0 of 4 lymph nodes involved.  The final pathology was T3a, N0, grade 2 tumor.   He then underwent radiation therapy to the pulmonary nodules which was completed in March 2023.   He then underwent  cryoablation to 2 lesions in the right kidney which was performed on 08/23/2021   He then underwent SRS treatment to the pituitary bed which was completed in October 2023.   he patient is currently undergoing pembrolizumab  200 mg IV every 3 weeks which is started on 08/04/2021. He is status post 37 cycles.  He is also on Axitnib 5 mg daily which was started in February 09, 2022.    Labs were reviewed.  Recommend that he proceed with cycle #40 today as scheduled.    We will see him back for follow-up visit in 3 weeks for evaluation repeat blood work before undergoing cycle #41    He will continue to use imodium for diarrhea.    He is going to monitor his blood pressure at home.   I will arrange for a restaging CT scan of the CAP prior to his next cycle of treatment. I ordered without contrast due to his one kidney.   Acute upper respiratory infection Symptoms improved by 75% with Mucinex and cough drops. - Continue symptomatic treatment with Mucinex and cough drops as needed. - I offered CXR but he declined. He states he is improving.   Chronic diarrhea Symptoms managed with Imodium, no changes. - Continue current management with Imodium as needed.  He was advised to avoid NSAIDs if possible. He is going to talk about management of his shoulder pain with his orthopedic provider at his next appointment.   The patient was advised to call immediately if she has any concerning symptoms in the interval. The patient voices understanding of current disease status and treatment options and is in agreement with the current care plan. All questions were answered. The patient knows to call the clinic with any problems, questions or concerns. We can certainly see the patient much sooner if necessary  Orders Placed This Encounter  Procedures   CT CHEST ABDOMEN PELVIS WO CONTRAST    Standing Status:   Future    Expected Date:   03/18/2024    Expiration Date:   03/03/2025    Preferred imaging  location?:   [facility]    If indicated for the ordered procedure, I authorize the administration of oral contrast media per Radiology protocol:   No    Reason for no oral contrast::   Kidney     The total time spent in the appointment was 20-29 minutes.  Kashaun Bebo L Gracieann Stannard, PA-C 03/03/24

## 2024-03-03 ENCOUNTER — Inpatient Hospital Stay: Attending: Oncology

## 2024-03-03 ENCOUNTER — Inpatient Hospital Stay: Attending: Oncology | Admitting: Physician Assistant

## 2024-03-03 VITALS — BP 125/76 | HR 83 | Temp 98.2°F | Resp 18 | Ht 69.0 in | Wt 229.9 lb

## 2024-03-03 DIAGNOSIS — E2740 Unspecified adrenocortical insufficiency: Secondary | ICD-10-CM | POA: Insufficient documentation

## 2024-03-03 DIAGNOSIS — C7800 Secondary malignant neoplasm of unspecified lung: Secondary | ICD-10-CM | POA: Insufficient documentation

## 2024-03-03 DIAGNOSIS — C642 Malignant neoplasm of left kidney, except renal pelvis: Secondary | ICD-10-CM

## 2024-03-03 DIAGNOSIS — C641 Malignant neoplasm of right kidney, except renal pelvis: Secondary | ICD-10-CM

## 2024-03-03 DIAGNOSIS — J189 Pneumonia, unspecified organism: Secondary | ICD-10-CM | POA: Insufficient documentation

## 2024-03-03 DIAGNOSIS — Z5112 Encounter for antineoplastic immunotherapy: Secondary | ICD-10-CM

## 2024-03-03 DIAGNOSIS — Z79620 Long term (current) use of immunosuppressive biologic: Secondary | ICD-10-CM | POA: Insufficient documentation

## 2024-03-03 DIAGNOSIS — K5900 Constipation, unspecified: Secondary | ICD-10-CM | POA: Insufficient documentation

## 2024-03-03 DIAGNOSIS — J069 Acute upper respiratory infection, unspecified: Secondary | ICD-10-CM | POA: Insufficient documentation

## 2024-03-03 DIAGNOSIS — K529 Noninfective gastroenteritis and colitis, unspecified: Secondary | ICD-10-CM | POA: Insufficient documentation

## 2024-03-03 LAB — CMP (CANCER CENTER ONLY)
ALT: 73 U/L — ABNORMAL HIGH (ref 0–44)
AST: 43 U/L — ABNORMAL HIGH (ref 15–41)
Albumin: 4.4 g/dL (ref 3.5–5.0)
Alkaline Phosphatase: 73 U/L (ref 38–126)
Anion gap: 14 (ref 5–15)
BUN: 19 mg/dL (ref 8–23)
CO2: 26 mmol/L (ref 22–32)
Calcium: 9.7 mg/dL (ref 8.9–10.3)
Chloride: 102 mmol/L (ref 98–111)
Creatinine: 1.22 mg/dL (ref 0.61–1.24)
GFR, Estimated: >60 mL/min (ref >60)
Glucose, Bld: 86 mg/dL (ref 70–99)
Potassium: 4.1 mmol/L (ref 3.5–5.1)
Sodium: 142 mmol/L (ref 135–145)
Total Bilirubin: 0.5 mg/dL (ref 0.0–1.2)
Total Protein: 7.6 g/dL (ref 6.5–8.1)

## 2024-03-03 LAB — CBC WITH DIFFERENTIAL (CANCER CENTER ONLY)
Abs Immature Granulocytes: 0.05 K/uL (ref 0.00–0.07)
Basophils Absolute: 0.1 K/uL (ref 0.0–0.1)
Basophils Relative: 1 %
Eosinophils Absolute: 0.2 K/uL (ref 0.0–0.5)
Eosinophils Relative: 2 %
HCT: 45.7 % (ref 39.0–52.0)
Hemoglobin: 14.9 g/dL (ref 13.0–17.0)
Immature Granulocytes: 1 %
Lymphocytes Relative: 22 %
Lymphs Abs: 2.3 K/uL (ref 0.7–4.0)
MCH: 29.7 pg (ref 26.0–34.0)
MCHC: 32.6 g/dL (ref 30.0–36.0)
MCV: 91.2 fL (ref 80.0–100.0)
Monocytes Absolute: 0.8 K/uL (ref 0.1–1.0)
Monocytes Relative: 8 %
Neutro Abs: 7 K/uL (ref 1.7–7.7)
Neutrophils Relative %: 66 %
Platelet Count: 206 K/uL (ref 150–400)
RBC: 5.01 MIL/uL (ref 4.22–5.81)
RDW: 15.7 % — ABNORMAL HIGH (ref 11.5–15.5)
WBC Count: 10.3 K/uL (ref 4.0–10.5)
nRBC: 0 % (ref 0.0–0.2)

## 2024-03-03 LAB — TSH: TSH: <0.1 u[IU]/mL — ABNORMAL LOW (ref 0.350–4.500)

## 2024-03-03 MED ORDER — SODIUM CHLORIDE 0.9 % IV SOLN
200.0000 mg | Freq: Once | INTRAVENOUS | Status: AC
  Administered 2024-03-03: 200 mg via INTRAVENOUS
  Filled 2024-03-03: qty 200

## 2024-03-03 MED ORDER — SODIUM CHLORIDE 0.9 % IV SOLN: Freq: Once | INTRAVENOUS | Status: AC

## 2024-03-03 NOTE — Patient Instructions (Signed)
 [facility] - A DEPT OF [facility]   Discharge Instructions: Thank you for choosing [facility] to provide your oncology and hematology care.   If you have a lab appointment with the Cancer Center, please go directly to the Cancer Center and check in at the registration area.   Wear comfortable clothing and clothing appropriate for easy access to any Portacath or PICC line.   We strive to give you quality time with your provider. You may need to reschedule your appointment if you arrive late (15 or more minutes).  Arriving late affects you and other patients whose appointments are after yours.  Also, if you miss three or more appointments without notifying the office, you may be dismissed from the clinic at the provider's discretion.      For prescription refill requests, have your pharmacy contact our office and allow 72 hours for refills to be completed.    Today you received the following chemotherapy and/or immunotherapy agents: Pembrolizumab Rande Lawman)      To help prevent nausea and vomiting after your treatment, we encourage you to take your nausea medication as directed.  BELOW ARE SYMPTOMS THAT SHOULD BE REPORTED IMMEDIATELY: *FEVER GREATER THAN 100.4 F (38 C) OR HIGHER *CHILLS OR SWEATING *NAUSEA AND VOMITING THAT IS NOT CONTROLLED WITH YOUR NAUSEA MEDICATION *UNUSUAL SHORTNESS OF BREATH *UNUSUAL BRUISING OR BLEEDING *URINARY PROBLEMS (pain or burning when urinating, or frequent urination) *BOWEL PROBLEMS (unusual diarrhea, constipation, pain near the anus) TENDERNESS IN MOUTH AND THROAT WITH OR WITHOUT PRESENCE OF ULCERS (sore throat, sores in mouth, or a toothache) UNUSUAL RASH, SWELLING OR PAIN  UNUSUAL VAGINAL DISCHARGE OR ITCHING   Items with * indicate a potential emergency and should be followed up as soon as possible or go to the Emergency Department if any problems should occur.  Please show the CHEMOTHERAPY ALERT CARD  or IMMUNOTHERAPY ALERT CARD at check-in to the Emergency Department and triage nurse.  Should you have questions after your visit or need to cancel or reschedule your appointment, please contact [facility] - A DEPT OF [facility]  Dept: [phone]  and follow the prompts.  Office hours are 8:00 a.m. to 4:30 p.m. Monday - Friday. Please note that voicemails left after 4:00 p.m. may not be returned until the following business day.  We are closed weekends and major holidays. You have access to a nurse at all times for urgent questions. Please call the main number to the clinic Dept: [phone] and follow the prompts.   For any non-urgent questions, you may also contact your provider using MyChart. We now offer e-Visits for anyone 75 and older to request care online for non-urgent symptoms. For details visit [email or web address].   Also download the MyChart app! Go to the app store, search "MyChart", open the app, select , and log in with your MyChart username and password.

## 2024-03-04 ENCOUNTER — Encounter (HOSPITAL_COMMUNITY): Payer: Self-pay

## 2024-03-04 ENCOUNTER — Inpatient Hospital Stay (HOSPITAL_COMMUNITY)
Admission: EM | Admit: 2024-03-04 | Discharge: 2024-03-07 | DRG: 871 | Disposition: A | Attending: Internal Medicine | Admitting: Internal Medicine

## 2024-03-04 ENCOUNTER — Other Ambulatory Visit: Payer: Self-pay

## 2024-03-04 ENCOUNTER — Inpatient Hospital Stay (HOSPITAL_COMMUNITY)

## 2024-03-04 ENCOUNTER — Emergency Department (HOSPITAL_COMMUNITY)

## 2024-03-04 ENCOUNTER — Ambulatory Visit: Admitting: Orthopaedic Surgery

## 2024-03-04 DIAGNOSIS — Z923 Personal history of irradiation: Secondary | ICD-10-CM | POA: Diagnosis not present

## 2024-03-04 DIAGNOSIS — J9601 Acute respiratory failure with hypoxia: Secondary | ICD-10-CM | POA: Diagnosis present

## 2024-03-04 DIAGNOSIS — E039 Hypothyroidism, unspecified: Secondary | ICD-10-CM

## 2024-03-04 DIAGNOSIS — R739 Hyperglycemia, unspecified: Secondary | ICD-10-CM | POA: Diagnosis not present

## 2024-03-04 DIAGNOSIS — N179 Acute kidney failure, unspecified: Secondary | ICD-10-CM | POA: Diagnosis present

## 2024-03-04 DIAGNOSIS — A419 Sepsis, unspecified organism: Secondary | ICD-10-CM | POA: Diagnosis present

## 2024-03-04 DIAGNOSIS — Z7982 Long term (current) use of aspirin: Secondary | ICD-10-CM | POA: Diagnosis not present

## 2024-03-04 DIAGNOSIS — Z96642 Presence of left artificial hip joint: Secondary | ICD-10-CM | POA: Diagnosis present

## 2024-03-04 DIAGNOSIS — J189 Pneumonia, unspecified organism: Secondary | ICD-10-CM | POA: Diagnosis not present

## 2024-03-04 DIAGNOSIS — E2749 Other adrenocortical insufficiency: Secondary | ICD-10-CM | POA: Diagnosis present

## 2024-03-04 DIAGNOSIS — T380X5A Adverse effect of glucocorticoids and synthetic analogues, initial encounter: Secondary | ICD-10-CM | POA: Diagnosis not present

## 2024-03-04 DIAGNOSIS — Z905 Acquired absence of kidney: Secondary | ICD-10-CM | POA: Diagnosis not present

## 2024-03-04 DIAGNOSIS — Z87891 Personal history of nicotine dependence: Secondary | ICD-10-CM | POA: Diagnosis not present

## 2024-03-04 DIAGNOSIS — C641 Malignant neoplasm of right kidney, except renal pelvis: Secondary | ICD-10-CM | POA: Diagnosis not present

## 2024-03-04 DIAGNOSIS — I2510 Atherosclerotic heart disease of native coronary artery without angina pectoris: Secondary | ICD-10-CM | POA: Diagnosis present

## 2024-03-04 DIAGNOSIS — C642 Malignant neoplasm of left kidney, except renal pelvis: Secondary | ICD-10-CM | POA: Diagnosis not present

## 2024-03-04 DIAGNOSIS — R6521 Severe sepsis with septic shock: Secondary | ICD-10-CM | POA: Diagnosis present

## 2024-03-04 DIAGNOSIS — E872 Acidosis, unspecified: Secondary | ICD-10-CM | POA: Diagnosis present

## 2024-03-04 DIAGNOSIS — E7800 Pure hypercholesterolemia, unspecified: Secondary | ICD-10-CM | POA: Diagnosis present

## 2024-03-04 DIAGNOSIS — Z1152 Encounter for screening for COVID-19: Secondary | ICD-10-CM | POA: Diagnosis not present

## 2024-03-04 DIAGNOSIS — J181 Lobar pneumonia, unspecified organism: Secondary | ICD-10-CM | POA: Diagnosis present

## 2024-03-04 DIAGNOSIS — M170 Bilateral primary osteoarthritis of knee: Secondary | ICD-10-CM | POA: Diagnosis present

## 2024-03-04 DIAGNOSIS — D630 Anemia in neoplastic disease: Secondary | ICD-10-CM | POA: Diagnosis present

## 2024-03-04 DIAGNOSIS — E785 Hyperlipidemia, unspecified: Secondary | ICD-10-CM | POA: Diagnosis not present

## 2024-03-04 DIAGNOSIS — Z85528 Personal history of other malignant neoplasm of kidney: Secondary | ICD-10-CM | POA: Diagnosis not present

## 2024-03-04 DIAGNOSIS — Z85118 Personal history of other malignant neoplasm of bronchus and lung: Secondary | ICD-10-CM | POA: Diagnosis not present

## 2024-03-04 DIAGNOSIS — Z79890 Hormone replacement therapy (postmenopausal): Secondary | ICD-10-CM | POA: Diagnosis not present

## 2024-03-04 DIAGNOSIS — C649 Malignant neoplasm of unspecified kidney, except renal pelvis: Secondary | ICD-10-CM | POA: Diagnosis not present

## 2024-03-04 DIAGNOSIS — J9691 Respiratory failure, unspecified with hypoxia: Secondary | ICD-10-CM | POA: Diagnosis not present

## 2024-03-04 LAB — RESP PANEL BY RT-PCR (RSV, FLU A&B, COVID)  RVPGX2
Influenza A by PCR: NEGATIVE
Influenza B by PCR: NEGATIVE
Resp Syncytial Virus by PCR: NEGATIVE
SARS Coronavirus 2 by RT PCR: NEGATIVE

## 2024-03-04 LAB — HIV ANTIBODY (ROUTINE TESTING W REFLEX): HIV Screen 4th Generation wRfx: NONREACTIVE

## 2024-03-04 LAB — COMPREHENSIVE METABOLIC PANEL WITH GFR
ALT: 53 U/L — ABNORMAL HIGH (ref 0–44)
AST: 37 U/L (ref 15–41)
Albumin: 3.6 g/dL (ref 3.5–5.0)
Alkaline Phosphatase: 65 U/L (ref 38–126)
Anion gap: 16 — ABNORMAL HIGH (ref 5–15)
BUN: 19 mg/dL (ref 8–23)
CO2: 24 mmol/L (ref 22–32)
Calcium: 8.7 mg/dL — ABNORMAL LOW (ref 8.9–10.3)
Chloride: 103 mmol/L (ref 98–111)
Creatinine, Ser: 2.12 mg/dL — ABNORMAL HIGH (ref 0.61–1.24)
GFR, Estimated: 34 mL/min — ABNORMAL LOW (ref >60)
Glucose, Bld: 90 mg/dL (ref 70–99)
Potassium: 3.5 mmol/L (ref 3.5–5.1)
Sodium: 142 mmol/L (ref 135–145)
Total Bilirubin: 0.7 mg/dL (ref 0.0–1.2)
Total Protein: 6.1 g/dL — ABNORMAL LOW (ref 6.5–8.1)

## 2024-03-04 LAB — CBC
HCT: 42.5 % (ref 39.0–52.0)
Hemoglobin: 14 g/dL (ref 13.0–17.0)
MCH: 30.3 pg (ref 26.0–34.0)
MCHC: 32.9 g/dL (ref 30.0–36.0)
MCV: 92 fL (ref 80.0–100.0)
Platelets: 262 K/uL (ref 150–400)
RBC: 4.62 MIL/uL (ref 4.22–5.81)
RDW: 15.8 % — ABNORMAL HIGH (ref 11.5–15.5)
WBC: 25.8 K/uL — ABNORMAL HIGH (ref 4.0–10.5)
nRBC: 0.2 % (ref 0.0–0.2)

## 2024-03-04 LAB — CBC WITH DIFFERENTIAL/PLATELET
Abs Immature Granulocytes: 0.1 K/uL — ABNORMAL HIGH (ref 0.00–0.07)
Basophils Absolute: 0 K/uL (ref 0.0–0.1)
Basophils Relative: 0 %
Eosinophils Absolute: 0 K/uL (ref 0.0–0.5)
Eosinophils Relative: 0 %
HCT: 47.9 % (ref 39.0–52.0)
Hemoglobin: 15.1 g/dL (ref 13.0–17.0)
Immature Granulocytes: 1 %
Lymphocytes Relative: 14 %
Lymphs Abs: 2.5 K/uL (ref 0.7–4.0)
MCH: 29.8 pg (ref 26.0–34.0)
MCHC: 31.5 g/dL (ref 30.0–36.0)
MCV: 94.7 fL (ref 80.0–100.0)
Monocytes Absolute: 1.2 K/uL — ABNORMAL HIGH (ref 0.1–1.0)
Monocytes Relative: 7 %
Neutro Abs: 14.1 K/uL — ABNORMAL HIGH (ref 1.7–7.7)
Neutrophils Relative %: 78 %
Platelets: 269 K/uL (ref 150–400)
RBC: 5.06 MIL/uL (ref 4.22–5.81)
RDW: 16 % — ABNORMAL HIGH (ref 11.5–15.5)
WBC: 17.9 K/uL — ABNORMAL HIGH (ref 4.0–10.5)
nRBC: 0.3 % — ABNORMAL HIGH (ref 0.0–0.2)

## 2024-03-04 LAB — BLOOD GAS, VENOUS
Acid-base deficit: 3.9 mmol/L — ABNORMAL HIGH (ref 0.0–2.0)
Bicarbonate: 23.8 mmol/L (ref 20.0–28.0)
O2 Saturation: 53 %
Patient temperature: 37
pCO2, Ven: 53 mmHg (ref 44–60)
pH, Ven: 7.26 (ref 7.25–7.43)
pO2, Ven: 35 mmHg (ref 32–45)

## 2024-03-04 LAB — T4: T4, Total: 5.5 ug/dL (ref 4.5–12.0)

## 2024-03-04 LAB — PROCALCITONIN: Procalcitonin: 7.18 ng/mL

## 2024-03-04 LAB — I-STAT CG4 LACTIC ACID, ED
Lactic Acid, Venous: 8.3 mmol/L (ref 0.5–1.9)
Lactic Acid, Venous: 3.3 mmol/L (ref 0.5–1.9)

## 2024-03-04 LAB — PROTIME-INR
INR: 1.1 (ref 0.8–1.2)
Prothrombin Time: 15.1 s (ref 11.4–15.2)

## 2024-03-04 LAB — CREATININE, SERUM
Creatinine, Ser: 2.09 mg/dL — ABNORMAL HIGH (ref 0.61–1.24)
GFR, Estimated: 34 mL/min — ABNORMAL LOW (ref >60)

## 2024-03-04 MED ORDER — VASOPRESSIN 20 UNITS/100 ML INFUSION FOR SHOCK
0.0000 [IU]/min | INTRAVENOUS | Status: DC
  Administered 2024-03-04 – 2024-03-05 (×2): 0.03 [IU]/min via INTRAVENOUS
  Filled 2024-03-04 (×2): qty 100

## 2024-03-04 MED ORDER — LACTATED RINGERS IV SOLN: INTRAVENOUS | Status: DC

## 2024-03-04 MED ORDER — DOCUSATE SODIUM 100 MG PO CAPS: 100.0000 mg | ORAL_CAPSULE | Freq: Two times a day (BID) | ORAL | Status: DC | PRN

## 2024-03-04 MED ORDER — ASPIRIN 81 MG PO TBEC
81.0000 mg | DELAYED_RELEASE_TABLET | Freq: Every evening | ORAL | Status: DC
  Administered 2024-03-04 – 2024-03-06 (×3): 81 mg via ORAL
  Filled 2024-03-04 (×3): qty 1

## 2024-03-04 MED ORDER — ROSUVASTATIN CALCIUM 20 MG PO TABS
40.0000 mg | ORAL_TABLET | Freq: Every day | ORAL | Status: DC
  Administered 2024-03-05 – 2024-03-07 (×3): 40 mg via ORAL
  Filled 2024-03-04: qty 4
  Filled 2024-03-04 (×2): qty 2

## 2024-03-04 MED ORDER — HYDROCODONE-ACETAMINOPHEN 10-325 MG PO TABS
1.0000 | ORAL_TABLET | ORAL | Status: DC | PRN
  Administered 2024-03-05: 1 via ORAL
  Administered 2024-03-06 (×2): 2 via ORAL
  Filled 2024-03-04 (×2): qty 2
  Filled 2024-03-04: qty 1

## 2024-03-04 MED ORDER — ACETAMINOPHEN 325 MG PO TABS
ORAL_TABLET | ORAL | Status: AC
  Filled 2024-03-04: qty 2

## 2024-03-04 MED ORDER — VITAMIN E 45 MG (100 UNIT) PO CAPS
400.0000 [IU] | ORAL_CAPSULE | Freq: Every evening | ORAL | Status: DC
  Administered 2024-03-04 – 2024-03-06 (×3): 400 [IU] via ORAL
  Filled 2024-03-04 (×5): qty 4

## 2024-03-04 MED ORDER — PNEUMOCOCCAL 20-VAL CONJ VACC 0.5 ML IM SUSY: 0.5000 mL | PREFILLED_SYRINGE | INTRAMUSCULAR | Status: DC | PRN

## 2024-03-04 MED ORDER — ADULT MULTIVITAMIN W/MINERALS CH
1.0000 | ORAL_TABLET | Freq: Every evening | ORAL | Status: DC
  Administered 2024-03-04 – 2024-03-06 (×3): 1 via ORAL
  Filled 2024-03-04 (×3): qty 1

## 2024-03-04 MED ORDER — SODIUM CHLORIDE 0.9 % IV SOLN
500.0000 mg | Freq: Every day | INTRAVENOUS | Status: DC
  Administered 2024-03-04 – 2024-03-06 (×3): 500 mg via INTRAVENOUS
  Filled 2024-03-04 (×3): qty 5

## 2024-03-04 MED ORDER — VANCOMYCIN HCL 2000 MG/400ML IV SOLN
2000.0000 mg | Freq: Once | INTRAVENOUS | Status: AC
  Administered 2024-03-04: 2000 mg via INTRAVENOUS
  Filled 2024-03-04: qty 400

## 2024-03-04 MED ORDER — NOREPINEPHRINE 4 MG/250ML-% IV SOLN
10.0000 ug/min | INTRAVENOUS | Status: DC
  Administered 2024-03-04: 10 ug/min via INTRAVENOUS
  Filled 2024-03-04: qty 250

## 2024-03-04 MED ORDER — ACETAMINOPHEN 325 MG PO TABS
650.0000 mg | ORAL_TABLET | Freq: Once | ORAL | Status: AC
  Administered 2024-03-04: 650 mg via ORAL

## 2024-03-04 MED ORDER — KETOROLAC TROMETHAMINE 15 MG/ML IJ SOLN
7.5000 mg | Freq: Once | INTRAMUSCULAR | Status: AC
  Administered 2024-03-04: 7.5 mg via INTRAVENOUS
  Filled 2024-03-04: qty 1

## 2024-03-04 MED ORDER — VANCOMYCIN HCL IN DEXTROSE 1-5 GM/200ML-% IV SOLN: 1000.0000 mg | Freq: Once | INTRAVENOUS | Status: DC

## 2024-03-04 MED ORDER — LACTATED RINGERS IV BOLUS
1000.0000 mL | Freq: Once | INTRAVENOUS | Status: AC
  Administered 2024-03-04: 1000 mL via INTRAVENOUS

## 2024-03-04 MED ORDER — HYDROCORTISONE SOD SUC (PF) 100 MG IJ SOLR
100.0000 mg | Freq: Three times a day (TID) | INTRAMUSCULAR | Status: DC
  Administered 2024-03-04 – 2024-03-05 (×4): 100 mg via INTRAVENOUS
  Filled 2024-03-04 (×4): qty 2

## 2024-03-04 MED ORDER — SODIUM CHLORIDE 0.9 % IV SOLN
2.0000 g | Freq: Once | INTRAVENOUS | Status: AC
  Administered 2024-03-04: 2 g via INTRAVENOUS
  Filled 2024-03-04: qty 12.5

## 2024-03-04 MED ORDER — OMEGA-3-ACID ETHYL ESTERS 1 G PO CAPS
1.0000 g | ORAL_CAPSULE | Freq: Every day | ORAL | Status: DC
  Administered 2024-03-05 – 2024-03-07 (×3): 1 g via ORAL
  Filled 2024-03-04 (×3): qty 1

## 2024-03-04 MED ORDER — LEVOTHYROXINE SODIUM 88 MCG PO TABS
88.0000 ug | ORAL_TABLET | Freq: Every day | ORAL | Status: DC
  Administered 2024-03-05 – 2024-03-07 (×3): 88 ug via ORAL
  Filled 2024-03-04 (×4): qty 1

## 2024-03-04 MED ORDER — ENOXAPARIN SODIUM 40 MG/0.4ML IJ SOSY
40.0000 mg | PREFILLED_SYRINGE | Freq: Every day | INTRAMUSCULAR | Status: DC
  Administered 2024-03-04 – 2024-03-07 (×4): 40 mg via SUBCUTANEOUS
  Filled 2024-03-04 (×4): qty 0.4

## 2024-03-04 MED ORDER — POLYETHYLENE GLYCOL 3350 17 G PO PACK: 17.0000 g | PACK | Freq: Every day | ORAL | Status: DC | PRN

## 2024-03-04 MED ORDER — INFLUENZA VAC SPLIT HIGH-DOSE 0.5 ML IM SUSY: 0.5000 mL | PREFILLED_SYRINGE | INTRAMUSCULAR | Status: DC | PRN

## 2024-03-04 MED ORDER — CHLORHEXIDINE GLUCONATE CLOTH 2 % EX PADS
6.0000 | MEDICATED_PAD | Freq: Every day | CUTANEOUS | Status: DC
  Administered 2024-03-04: 6 via TOPICAL

## 2024-03-04 MED ORDER — ONDANSETRON HCL 4 MG/2ML IJ SOLN
4.0000 mg | Freq: Once | INTRAMUSCULAR | Status: AC
  Administered 2024-03-04: 4 mg via INTRAVENOUS
  Filled 2024-03-04: qty 2

## 2024-03-04 MED ORDER — NOREPINEPHRINE 4 MG/250ML-% IV SOLN
0.0000 ug/min | INTRAVENOUS | Status: DC
  Administered 2024-03-04: 30 ug/min via INTRAVENOUS
  Administered 2024-03-04: 35 ug/min via INTRAVENOUS
  Administered 2024-03-04: 40 ug/min via INTRAVENOUS
  Administered 2024-03-04: 34 ug/min via INTRAVENOUS
  Filled 2024-03-04 (×4): qty 250

## 2024-03-04 MED ORDER — LACTATED RINGERS IV SOLN: INTRAVENOUS | Status: AC

## 2024-03-04 MED ORDER — SODIUM CHLORIDE 0.9 % IV SOLN
2.0000 g | Freq: Every day | INTRAVENOUS | Status: DC
  Administered 2024-03-04 – 2024-03-06 (×3): 2 g via INTRAVENOUS
  Filled 2024-03-04 (×3): qty 20

## 2024-03-04 MED ORDER — LACTATED RINGERS IV BOLUS (SEPSIS)
2000.0000 mL | Freq: Once | INTRAVENOUS | Status: AC
  Administered 2024-03-04: 2000 mL via INTRAVENOUS

## 2024-03-04 NOTE — ED Notes (Signed)
RT at [facility]. Pt alert, NAD, calm, interactive.

## 2024-03-04 NOTE — Sepsis Progress Note (Signed)
 Confirmed with bedside RN Dorn and Colonel that blood cultures were drawn prior to administration of the antibiotics.

## 2024-03-04 NOTE — ED Notes (Addendum)
 EDP at [facility] speaking with pt and family. Xray in progress

## 2024-03-04 NOTE — ED Notes (Signed)
 RN and DO aware of pt's BP

## 2024-03-04 NOTE — H&P (Signed)
 NAME:  Marc [name], MRN:  [id number], DOB:  1956/01/26, LOS: 0 ADMISSION DATE:  03/04/2024, CONSULTATION DATE:  03/04/24 REFERRING MD:  Mannie CHIEF COMPLAINT:  Dyspnea, AMS  History of Present Illness:  Marc [name] is a 68 y.o. male who has a PMH as outlined below including but is limited to stage IV clear-cell renal cell carcinoma (status post left radical nephrectomy 2021 as well as cryoablation to 2 lesions of the right kidney completed in 2023) with pulmonary involvement (s/p radiation completed 2023) as well as pituitary metastasis diagnosed in 2023 (s/p transsphenoidal resection and SRS in 2023).  He is currently undergoing immunotherapy.  He presented to [facility] ED on 12/3 with dyspnea, fatigue, chills, cough.  Symptoms began about 4 to 5 days ago and progressed.  He normally walks at least 6000 steps every day and has been doing so for over 500 days; however on day of presentation, he was unable to do so.  EMS was called and he was found to be hypoxic to 80s on room air.  After arrival to the ED, he was noted to be hypotensive.  He received 3 L of fluids and had little improvement; therefore, he was started on norepinephrine.  He was also hypoxic and required HHFNC at 40L/min with 60% FiO2. CXR suggestive of left basilar pneumonia.  Initial lactic was elevated to 8.3 with repeat down to 3.3 after fluids.  Given his pressor requirements, PCCM was called for admission.  He denies any prior history of pneumonia.  No recent hospitalizations.  He was in his otherwise usual state of health up until about 4 to 5 days before this.  As above, he normally walks 6000 steps a without any difficulties.  He has not had any recent illnesses as not been exposed anyone sick recently. He does have a cough with occasional sputum that he has been swallowing.  Pertinent  Medical History:  has Renal mass; Unilateral primary osteoarthritis, left hip; Status post total replacement of left hip; Unilateral primary  osteoarthritis, left knee; Unilateral primary osteoarthritis, right knee; Kidney cancer, primary, with metastasis from kidney to other site [facility]); Coronary artery disease; Gallstones; Hardening of the aorta (main artery of the heart); History of hypercholesterolemia; Low testosterone ; Osteoarthritis; Other specified nonpsychotic mental disorders; History of colonic polyps; Personal history of other malignant neoplasm of kidney; Pure hypercholesterolemia; Tobacco use; Secondary renal cell carcinoma of lung, right (HCC); Renal cell carcinoma of right kidney (HCC); Secondary adrenal insufficiency; Low testosterone  in male; Pituitary macroadenoma (HCC); Status post transsphenoidal pituitary resection; Metastasis to Pituitary [facility]); Encounter for antineoplastic immunotherapy; and Septic shock (HCC) on their problem list.   Significant Hospital Events: Including procedures, antibiotic start and stop dates in addition to other pertinent events   12/3 admit  Interim History / Subjective:  On HHFNC at 40LPM with 60% O2. Comfortable, able to speak in full sentences.  Objective:  Blood pressure (!) 81/60, pulse (!) 106, temperature (!) 100.5 F (38.1 C), temperature source Oral, resp. rate 20, height 5' 9 (1.753 m), weight 104.3 kg, SpO2 94%.    FiO2 (%):  [60 %] 60 %   Intake/Output Summary (Last 24 hours) at 03/04/2024 1422 Last data filed at 03/04/2024 1407 Gross per 24 hour  Intake 4100 ml  Output --  Net 4100 ml   Filed Weights   03/04/24 1108  Weight: 104.3 kg     Physical Exam: General: Adult male, resting in bed, in NAD. Neuro: A&O x 3, no deficits.  HEENT: [place]/AT. Sclerae anicteric. EOMI. MM dry. Cardiovascular: RRR, no M/R/G.  Lungs: Respirations even and unlabored.  CTA bilaterally, No W/R/R. Abdomen: BS x 4, soft, NT/ND.  Musculoskeletal: No gross deformities, no edema.    Labs/imaging personally reviewed:  CXR 12/3 > possible basilar PNA.  Assessment & Plan:   Septic shock  - 2/2 Basilar PNA, community acquired. - Continue fluids, additional 1L LR now then MIVF at 150/hr. - Continue NE, wean as able. Would accept goal MAP > 55 given that we are taking cuff pressures on his forearm. Question accuracy of current readings of MAP 62. - Defer CVL placement for now, will revisit if needed. - Continue CTX, Azithromycin . - Start stress steroids.  Acute hypoxic respiratory failure - 2/2 CAP as above. - Continue HHFNC, wean as able. Goal SpO2 > 92%. - Abx as above. - Bronchial hygiene.  AKI. Lactic acidosis. - Fluids as above. - Follow BMP.  Hx hypothyroidism. - Continue PTA Synthroid .  Hx HLD. - Continue PTA Rosuvastatin .  Hx stage IV clear-cell RCC (status post left radical nephrectomy 2021 as well as cryoablation to 2 lesions of the right kidney completed in 2023) with pulmonary involvement (s/p radiation completed 2023) as well as pituitary metastasis diagnosed in 2023 (s/p transsphenoidal resection and SRS in 2023).  - Hold PTA Inlyta  for now. - F/u with oncology as an outpatient.  Labs   CBC: Recent Labs  Lab 03/03/24 0753 03/04/24 1141  WBC 10.3 17.9*  NEUTROABS 7.0 14.1*  HGB 14.9 15.1  HCT 45.7 47.9  MCV 91.2 94.7  PLT 206 269    Basic Metabolic Panel: Recent Labs  Lab 03/03/24 0753 03/04/24 1141  NA 142 142  K 4.1 3.5  CL 102 103  CO2 26 24  GLUCOSE 86 90  BUN 19 19  CREATININE 1.22 2.12*  CALCIUM  9.7 8.7*   GFR: Estimated Creatinine Clearance: 40.8 mL/min (A) (by C-G formula based on SCr of 2.12 mg/dL (H)). Recent Labs  Lab 03/03/24 0753 03/04/24 1141 03/04/24 1144 03/04/24 1328  WBC 10.3 17.9*  --   --   LATICACIDVEN  --   --  8.3* 3.3*    Liver Function Tests: Recent Labs  Lab 03/03/24 0753 03/04/24 1141  AST 43* 37  ALT 73* 53*  ALKPHOS 73 65  BILITOT 0.5 0.7  PROT 7.6 6.1*  ALBUMIN  4.4 3.6   No results for input(s): LIPASE, AMYLASE in the last 168 hours. No results for input(s): AMMONIA in the  last 168 hours.  ABG    Component Value Date/Time   PHART 7.361 11/08/2021 1153   PCO2ART 39.8 11/08/2021 1153   PO2ART 188 (H) 11/08/2021 1153   HCO3 23.8 03/04/2024 1210   TCO2 24 11/08/2021 1153   ACIDBASEDEF 3.9 (H) 03/04/2024 1210   O2SAT 53 03/04/2024 1210     Coagulation Profile: Recent Labs  Lab 03/04/24 1141  INR 1.1    Cardiac Enzymes: No results for input(s): CKTOTAL, CKMB, CKMBINDEX, TROPONINI in the last 168 hours.  HbA1C: Hgb A1c MFr Bld  Date/Time Value Ref Range Status  09/16/2023 12:41 PM 6.3 (H) 4.8 - 5.6 % Final    Comment:             Prediabetes: 5.7 - 6.4          Diabetes: >6.4          Glycemic control for adults with diabetes: <7.0     CBG: No results for input(s): GLUCAP in the last  168 hours.  Review of Systems:   All negative; except for those that are bolded, which indicate positives.  Constitutional: weight loss, weight gain, night sweats, fevers, chills, fatigue, weakness.  HEENT: headaches, sore throat, sneezing, nasal congestion, post nasal drip, difficulty swallowing, tooth/dental problems, visual complaints, visual changes, ear aches. Neuro: difficulty with speech, weakness, numbness, ataxia. CV:  chest pain, orthopnea, PND, swelling in lower extremities, dizziness, palpitations, syncope.  Resp: cough, hemoptysis, dyspnea, wheezing. GI: heartburn, indigestion, abdominal pain, nausea, vomiting, diarrhea, constipation, change in bowel habits, loss of appetite, hematemesis, melena, hematochezia.  GU: dysuria, change in color of urine, urgency or frequency, flank pain, hematuria. MSK: joint pain or swelling, decreased range of motion. Psych: change in mood or affect, depression, anxiety, suicidal ideations, homicidal ideations. Skin: rash, itching, bruising.   Past Medical History:  He,  has a past medical history of Arthritis, Cancer (HCC), Hyperlipidemia, and Hypothyroidism.   Surgical History:   Past Surgical  History:  Procedure Laterality Date   CRANIOTOMY N/A 11/08/2021   Procedure: ENDOSCOPIC ENDONASAL RESECTION OF SELLAR MASS;  Surgeon: Cheryle Debby LABOR, MD;  Location: [facility] OR;  Service: Neurosurgery;  Laterality: N/A;  RM 20   IR RADIOLOGIST EVAL & MGMT  07/14/2021   IR RADIOLOGIST EVAL & MGMT  07/27/2021   IR RADIOLOGIST EVAL & MGMT  01/30/2022   IR RADIOLOGIST EVAL & MGMT  05/08/2023   IR RADIOLOGIST EVAL & MGMT  11/13/2023   RADIOFREQUENCY ABLATION N/A 08/23/2021   Procedure: RENAL CRYO ABLATION;  Surgeon: Vanice Sharper, MD;  Location: [facility] ORS;  Service: Anesthesiology;  Laterality: N/A;   right shoulder rotator cuff repair Right    ROBOT ASSISTED LAPAROSCOPIC NEPHRECTOMY Left 01/05/2020   Procedure: XI ROBOTIC ASSISTED LAPAROSCOPIC RADICAL NEPHRECTOMY;  Surgeon: Devere Lonni Righter, MD;  Location: [facility] ORS;  Service: Urology;  Laterality: Left;   TOTAL HIP ARTHROPLASTY Left 03/31/2020   Procedure: LEFT TOTAL HIP ARTHROPLASTY ANTERIOR APPROACH;  Surgeon: Vernetta Lonni GRADE, MD;  Location: [facility] OR;  Service: Orthopedics;  Laterality: Left;   TRANSPHENOIDAL APPROACH EXPOSURE N/A 11/08/2021   Procedure: TRANSPHENOIDAL APPROACH EXPOSURE;  Surgeon: Mable Lenis, MD;  [facility];  Service: ENT;  Laterality: N/A;     Social History:   reports that he quit smoking about 8 years ago. His smoking use included cigarettes. He started smoking about 38 years ago. He has never used smokeless tobacco. He reports current alcohol use. He reports that he does not use drugs.   Family History:  His family history is not on file.   Allergies No Known Allergies   Home Medications  Prior to Admission medications   Medication Sig Start Date End Date Taking? Authorizing Provider  Ascorbic Acid  (VITAMIN C PO) Take 1 tablet by mouth every evening.    [provider]  aspirin  EC 81 MG tablet Take 81 mg by mouth every evening. Swallow whole.    [provider]  chlorhexidine  (PERIDEX )  0.12 % solution SMARTSIG:By Mouth 11/20/22   [provider]  cholecalciferol  (VITAMIN D3) 25 MCG (1000 UNIT) tablet Take 1,000 Units by mouth every evening.    [provider]  Ferrous Sulfate  (IRON SLOW RELEASE PO) Take 1 tablet by mouth every evening.    [provider]  Ferrous Sulfate  Dried (SM SLOW RELEASE IRON) 143 (45 Fe) MG TBCR as directed Orally    [provider]  HYDROcodone -acetaminophen  (NORCO) 10-325 MG tablet Take by mouth. 11/20/22   [provider]  ibuprofen  (ADVIL )  200 MG tablet Take 400-600 mg by mouth every 6 (six) hours as needed for moderate pain.    [provider]  INLYTA  5 MG tablet TAKE 1 TABLET BY MOUTH DAILY 12/30/23   Heilingoetter, Cassandra L, PA-C  levothyroxine  (SYNTHROID ) 88 MCG tablet Take 1 tablet (88 mcg total) by mouth daily. 06/26/23   Shamleffer, Ibtehal Jaralla, MD  Multiple Vitamin (MULTI VITAMIN) TABS 1 tablet Orally Once a day for 30 day(s)    [provider]  Multiple Vitamins-Minerals (MULTIVITAMIN WITH MINERALS) tablet Take 1 tablet by mouth every evening.    [provider]  Omega-3 Fatty Acids (FISH OIL PO) Take 1 capsule by mouth every evening.    [provider]  ondansetron  (ZOFRAN -ODT) 8 MG disintegrating tablet Take 8 mg by mouth daily. 11/20/22   [provider]  predniSONE  (DELTASONE ) 5 MG tablet TAKE 1 AND 1/2 TABLETS(7.5 MG) BY MOUTH DAILY WITH BREAKFAST. MAY DOUBLE UP DURING SICK DAYS 01/15/24   Shamleffer, Ibtehal Jaralla, MD  prochlorperazine  (COMPAZINE ) 10 MG tablet Take 1 tablet (10 mg total) by mouth every 6 (six) hours as needed for nausea or vomiting. 08/31/21   Shadad, Firas N, MD  rosuvastatin  (CRESTOR ) 40 MG tablet Take 1 tablet (40 mg total) by mouth daily. 09/17/23 02/10/24  Madie Jon Garre, PA  Testosterone  (ANDROGEL ) 20.25 MG/1.25GM (1.62%) GEL Place 3 Pump onto the skin as directed. 09/19/23   Shamleffer, Ibtehal Jaralla, MD  vitamin E  180  MG (400 UNITS) capsule Take 400 Units by mouth every evening.    [provider]     Critical care time: 30 min.   Sammi Gore, PA - C [facility] For pager details, please see AMION or use Epic chat  After 1900, please call [facility] for cross coverage needs 03/04/2024, 2:22 PM

## 2024-03-04 NOTE — ED Triage Notes (Signed)
 Pt BIB EMS from Home due flank pain, chills, lethargy, since yesterday. Pt reports productive cough for 4 days. Diminished lung sounds. AAOx4 warm to touch. Hx of Kidney Cancer, currently in treatment.  BP 77/40 BP 120/88 after 400 mL NaCl fluids. 18 ga LAC CBG 131 SpO2 88% RA SpO2 98% Non-rebreather

## 2024-03-04 NOTE — ED Notes (Signed)
 Repeat lactic sent to mini-lab (2nd repeat).

## 2024-03-04 NOTE — Procedures (Signed)
 Arterial Catheter Insertion Procedure Note  Marc [name]  [id number]  May 14, 1955  Date:03/04/24  Time:5:01 PM    Provider Performing: Sammi Gore    Procedure: Insertion of Arterial Line (63379) without US  guidance  Indication(s) Blood pressure monitoring and/or need for frequent ABGs  Consent Risks of the procedure as well as the alternatives and risks of each were explained to the patient and/or caregiver.  Consent for the procedure was obtained and is signed in the bedside chart  Anesthesia None   Time Out Verified patient identification, verified procedure, site/side was marked, verified correct patient position, special equipment/implants available, medications/allergies/relevant history reviewed, required imaging and test results available.   Sterile Technique Maximal sterile technique including full sterile barrier drape, hand hygiene, sterile gown, sterile gloves, mask, hair covering, sterile ultrasound probe cover (if used).   Procedure Description Area of catheter insertion was cleaned with chlorhexidine  and draped in sterile fashion. Without real-time ultrasound guidance an arterial catheter was placed into the right radial artery.  Appropriate arterial tracings confirmed on monitor.     Complications/Tolerance None; patient tolerated the procedure well.   EBL Minimal   Specimen(s) None   Sammi Gore, PA - C [place] [facility] For pager details, please see AMION or use Epic chat  After 1900, please call [facility] for cross coverage needs 03/04/2024, 5:02 PM

## 2024-03-04 NOTE — Sepsis Progress Note (Signed)
 eLink is following this Code Sepsis.

## 2024-03-04 NOTE — ED Provider Notes (Signed)
 [place] EMERGENCY DEPARTMENT AT [facility] Provider Note   CSN: [id number] Arrival date & time: 03/04/24  1052     Patient presents with: Flank Pain, Chills, and Fatigue   Marc [name] is a 68 y.o. male.   This is a 68 year old male with a history of stage IV renal cell carcinoma, currently on immunotherapy, here today for fatigue, chills, cough.  Patient reports beginning 3 days ago he started to have a cough.  Started to feel worse today, went to stand up and felt too weak.  Wife returned and found him down on the ground unable to pick himself up.  When EMS arrived, patient was hypoxic to the high 80s.  He has felt chills.   Flank Pain       Prior to Admission medications   Medication Sig Start Date End Date Taking? Authorizing Provider  Ascorbic Acid  (VITAMIN C PO) Take 1 tablet by mouth every evening.    [provider]  aspirin  EC 81 MG tablet Take 81 mg by mouth every evening. Swallow whole.    [provider]  chlorhexidine  (PERIDEX ) 0.12 % solution SMARTSIG:By Mouth 11/20/22   [provider]  cholecalciferol  (VITAMIN D3) 25 MCG (1000 UNIT) tablet Take 1,000 Units by mouth every evening.    [provider]  Ferrous Sulfate  (IRON SLOW RELEASE PO) Take 1 tablet by mouth every evening.    [provider]  Ferrous Sulfate  Dried (SM SLOW RELEASE IRON) 143 (45 Fe) MG TBCR as directed Orally    [provider]  HYDROcodone -acetaminophen  (NORCO) 10-325 MG tablet Take by mouth. 11/20/22   [provider]  ibuprofen  (ADVIL ) 200 MG tablet Take 400-600 mg by mouth every 6 (six) hours as needed for moderate pain.    [provider]  INLYTA  5 MG tablet TAKE 1 TABLET BY MOUTH DAILY 12/30/23   Heilingoetter, Cassandra L, PA-C  levothyroxine  (SYNTHROID ) 88 MCG tablet Take 1 tablet (88 mcg total) by mouth daily. 06/26/23   Shamleffer, Ibtehal Jaralla, MD  Multiple Vitamin (MULTI VITAMIN) TABS 1 tablet Orally  Once a day for 30 day(s)    [provider]  Multiple Vitamins-Minerals (MULTIVITAMIN WITH MINERALS) tablet Take 1 tablet by mouth every evening.    [provider]  Omega-3 Fatty Acids (FISH OIL PO) Take 1 capsule by mouth every evening.    [provider]  ondansetron  (ZOFRAN -ODT) 8 MG disintegrating tablet Take 8 mg by mouth daily. 11/20/22   [provider]  predniSONE  (DELTASONE ) 5 MG tablet TAKE 1 AND 1/2 TABLETS(7.5 MG) BY MOUTH DAILY WITH BREAKFAST. MAY DOUBLE UP DURING SICK DAYS 01/15/24   Shamleffer, Ibtehal Jaralla, MD  prochlorperazine  (COMPAZINE ) 10 MG tablet Take 1 tablet (10 mg total) by mouth every 6 (six) hours as needed for nausea or vomiting. 08/31/21   Shadad, Firas N, MD  rosuvastatin  (CRESTOR ) 40 MG tablet Take 1 tablet (40 mg total) by mouth daily. 09/17/23 02/10/24  Madie Jon Garre, PA  Testosterone  (ANDROGEL ) 20.25 MG/1.25GM (1.62%) GEL Place 3 Pump onto the skin as directed. 09/19/23   Shamleffer, Ibtehal Jaralla, MD  vitamin E  180 MG (400 UNITS) capsule Take 400 Units by mouth every evening.    [provider]    Allergies: Patient has no known allergies.    Review of Systems  Genitourinary:  Positive for flank pain.    Updated Vital Signs BP 92/66   Pulse (!) 120   Temp (!) 100.5 F (38.1 C) (  Oral)   Resp (!) 27   Ht 5' 9 (1.753 m)   Wt 104.3 kg   SpO2 95%   BMI 33.97 kg/m   Physical Exam Vitals and nursing note reviewed.  Constitutional:      Appearance: He is ill-appearing and toxic-appearing.  HENT:     Mouth/Throat:     Mouth: Mucous membranes are dry.  Eyes:     Pupils: Pupils are equal, round, and reactive to light.  Cardiovascular:     Rate and Rhythm: Regular rhythm. Tachycardia present.  Pulmonary:     Comments: Tachypnea, rales or rhonchi present right lower lobe Abdominal:     General: Abdomen is flat. There is no distension.     Palpations: Abdomen is soft.     Tenderness: There is no  abdominal tenderness.  Musculoskeletal:        General: Normal range of motion.     Cervical back: Normal range of motion.  Neurological:     General: No focal deficit present.     Mental Status: He is alert and oriented to person, place, and time.     (all labs ordered are listed, but only abnormal results are displayed) Labs Reviewed  COMPREHENSIVE METABOLIC PANEL WITH GFR - Abnormal; Notable for the following components:      Result Value   Creatinine, Ser 2.12 (*)    Calcium  8.7 (*)    Total Protein 6.1 (*)    ALT 53 (*)    GFR, Estimated 34 (*)    Anion gap 16 (*)    All other components within normal limits  CBC WITH DIFFERENTIAL/PLATELET - Abnormal; Notable for the following components:   WBC 17.9 (*)    RDW 16.0 (*)    nRBC 0.3 (*)    Neutro Abs 14.1 (*)    Monocytes Absolute 1.2 (*)    Abs Immature Granulocytes 0.10 (*)    All other components within normal limits  BLOOD GAS, VENOUS - Abnormal; Notable for the following components:   Acid-base deficit 3.9 (*)    All other components within normal limits  I-STAT CG4 LACTIC ACID, ED - Abnormal; Notable for the following components:   Lactic Acid, Venous 8.3 (*)    All other components within normal limits  RESP PANEL BY RT-PCR (RSV, FLU A&B, COVID)  RVPGX2  CULTURE, BLOOD (ROUTINE X 2)  CULTURE, BLOOD (ROUTINE X 2)  PROTIME-INR  URINALYSIS, W/ REFLEX TO CULTURE (INFECTION SUSPECTED)  I-STAT CG4 LACTIC ACID, ED    EKG: None  Radiology: DG Chest Port 1 View Result Date: 03/04/2024 CLINICAL DATA:  Sepsis. EXAM: PORTABLE CHEST 1 VIEW COMPARISON:  03/16/2021 FINDINGS: The heart size and mediastinal contours are within normal limits. Very low lung volumes with bibasilar atelectasis. Difficult to exclude component of pneumonia, especially at the right lung base. No pulmonary edema or pleural fluid identified. No pneumothorax. The visualized skeletal structures are unremarkable. IMPRESSION: Very low lung volumes with  bibasilar atelectasis. Difficult to exclude component of pneumonia, especially at the right lung base. Electronically Signed   By: Marcey Moan M.D.   On: 03/04/2024 13:10     .Critical Care  Performed by: Mannie Fairy DASEN, DO Authorized by: Mannie Fairy DASEN, DO   Critical care provider statement:    Critical care time (minutes):  77   Critical care was necessary to treat or prevent imminent or life-threatening deterioration of the following conditions:  Sepsis, shock and respiratory failure   Critical care was time  spent personally by me on the following activities:  Development of treatment plan with patient or surrogate, discussions with consultants, evaluation of patient's response to treatment, examination of patient, ordering and review of laboratory studies, ordering and review of radiographic studies, ordering and performing treatments and interventions, pulse oximetry, re-evaluation of patient's condition and review of old charts    Medications Ordered in the ED  lactated ringers  infusion ( Intravenous New Bag/Given 03/04/24 1242)  vancomycin (VANCOREADY) IVPB 2000 mg/400 mL (2,000 mg Intravenous New Bag/Given 03/04/24 1210)  norepinephrine (LEVOPHED) 4mg  in (0.016 mg/mL) premix infusion (10 mcg/min Intravenous New Bag/Given 03/04/24 1234)  lactated ringers  bolus 1,000 mL (has no administration in time range)  ceFEPIme (MAXIPIME) 2 g in sodium chloride  0.9 % 100 mL IVPB (0 g Intravenous Stopped 03/04/24 1159)  lactated ringers  bolus 2,000 mL (0 mLs Intravenous Stopped 03/04/24 1159)  ketorolac (TORADOL) 15 MG/ML injection 7.5 mg (7.5 mg Intravenous Given 03/04/24 1148)  ondansetron  (ZOFRAN ) injection 4 mg (4 mg Intravenous Given 03/04/24 1148)  lactated ringers  bolus 1,000 mL (0 mLs Intravenous Stopped 03/04/24 1209)  acetaminophen  (TYLENOL ) tablet 650 mg (650 mg Oral Given 03/04/24 1220)                                    Medical Decision Making 68 year old male here today  with weakness, fatigue, fever.  Differential diagnoses include sepsis, septic shock, pneumonia, acute hypoxic respiratory failure, consider intra-abdominal infection.  Plan -patient met sepsis criteria upon arrival.  Started on broad-spectrum antibiotics, 3 L of IV fluid ordered.  Out of concern for fluid overload, provided the patient with 2 L of IV fluid and will reassess.  Patient is maintaining O's O2 sats of 92% on 6 L nasal cannula, had respiratory therapy placed patient on heated high flow nasal cannula.  Patient's chest x-ray does show a right lobar pneumonia, consistent with his symptoms.  Maps currently hanging between 65 and 75.  Do suspect patient will likely require pressor support.  Reassessment 1:15 PM-patient's maps continue to trend down, started the patient on 10 mics per minute of norepinephrine.  At this time, patient has received 3 L of IV fluid.  I have reassessed the patient.  Good improvement in maps.  Patient mentating well, making jokes.  He is doing well on his high flow nasal cannula.  Patient with acute kidney injury on top, lactic acidosis of 8.3.  I have ordered an additional 1 L of fluid for the patient.  Will admit patient to intensivist for pneumonia, septic shock.  Amount and/or Complexity of Data Reviewed Labs: ordered. Radiology: ordered.  Risk OTC drugs. Prescription drug management.        Final diagnoses:  Sepsis with acute hypoxic respiratory failure and septic shock, due to unspecified organism [facility])    ED Discharge Orders     None          Mannie Pac T, DO 03/04/24 1316

## 2024-03-04 NOTE — Procedures (Signed)
 Central Venous Catheter Insertion Procedure Note  JAYVIN HURRELL  [id number]  September 24, 1955  Date:03/04/24  Time:4:37 PM   Provider Performing:Keshun Berrett A Neda   Procedure: Insertion of Non-tunneled Central Venous Catheter(36556) with US  guidance (23062)   Indication(s) Medication administration  Consent Patients verbal consent  Anesthesia Topical only with 1% lidocaine    Timeout Verified patient identification, verified procedure, site/side was marked, verified correct patient position, special equipment/implants available, medications/allergies/relevant history reviewed, required imaging and test results available.  Sterile Technique Maximal sterile technique including full sterile barrier drape, hand hygiene, sterile gown, sterile gloves, mask, hair covering, sterile ultrasound probe cover (if used).  Procedure Description Area of catheter insertion was cleaned with chlorhexidine  and draped in sterile fashion.  With real-time ultrasound guidance a central venous catheter was placed into the left internal jugular vein. Nonpulsatile blood flow and easy flushing noted in all ports.  The catheter was sutured in place and sterile dressing applied.  Complications/Tolerance None; patient tolerated the procedure well. Chest X-ray is ordered to verify placement for internal jugular or subclavian cannulation.   Chest x-ray is not ordered for femoral cannulation.  EBL Minimal  Specimen(s) None

## 2024-03-05 LAB — BASIC METABOLIC PANEL WITH GFR
Anion gap: 12 (ref 5–15)
BUN: 23 mg/dL (ref 8–23)
CO2: 19 mmol/L — ABNORMAL LOW (ref 22–32)
Calcium: 7.1 mg/dL — ABNORMAL LOW (ref 8.9–10.3)
Chloride: 102 mmol/L (ref 98–111)
Creatinine, Ser: 1.93 mg/dL — ABNORMAL HIGH (ref 0.61–1.24)
GFR, Estimated: 38 mL/min — ABNORMAL LOW (ref >60)
Glucose, Bld: 220 mg/dL — ABNORMAL HIGH (ref 70–99)
Potassium: 4.6 mmol/L (ref 3.5–5.1)
Sodium: 133 mmol/L — ABNORMAL LOW (ref 135–145)

## 2024-03-05 LAB — RESPIRATORY PANEL BY PCR

## 2024-03-05 LAB — CBC
HCT: 41 % (ref 39.0–52.0)
Hemoglobin: 13.1 g/dL (ref 13.0–17.0)
MCH: 30.4 pg (ref 26.0–34.0)
MCHC: 32 g/dL (ref 30.0–36.0)
MCV: 95.1 fL (ref 80.0–100.0)
Platelets: 201 K/uL (ref 150–400)
RBC: 4.31 MIL/uL (ref 4.22–5.81)
RDW: 15.7 % — ABNORMAL HIGH (ref 11.5–15.5)
WBC: 20 K/uL — ABNORMAL HIGH (ref 4.0–10.5)
nRBC: 0.1 % (ref 0.0–0.2)

## 2024-03-05 LAB — GLUCOSE, CAPILLARY
Glucose-Capillary: 155 mg/dL — ABNORMAL HIGH (ref 70–99)
Glucose-Capillary: 88 mg/dL (ref 70–99)
Glucose-Capillary: 82 mg/dL (ref 70–99)

## 2024-03-05 MED ORDER — LACTATED RINGERS IV SOLN: INTRAVENOUS | Status: DC

## 2024-03-05 MED ORDER — PREDNISONE 5 MG PO TABS
7.5000 mg | ORAL_TABLET | Freq: Every day | ORAL | Status: DC
  Administered 2024-03-06 – 2024-03-07 (×2): 7.5 mg via ORAL
  Filled 2024-03-05 (×2): qty 2

## 2024-03-05 MED ORDER — CHLORHEXIDINE GLUCONATE CLOTH 2 % EX PADS: 6.0000 | MEDICATED_PAD | Freq: Every day | CUTANEOUS | Status: DC

## 2024-03-05 MED ORDER — NOREPINEPHRINE 16 MG/250ML-% IV SOLN
0.0000 ug/min | INTRAVENOUS | Status: DC
  Administered 2024-03-05: 16 ug/min via INTRAVENOUS
  Filled 2024-03-05: qty 250

## 2024-03-05 MED ORDER — INSULIN ASPART 100 UNIT/ML IJ SOLN
0.0000 [IU] | INTRAMUSCULAR | Status: DC
  Administered 2024-03-05: 3 [IU] via SUBCUTANEOUS
  Filled 2024-03-05: qty 3
  Filled 2024-03-05: qty 2

## 2024-03-05 MED ORDER — HYDROCORTISONE SOD SUC (PF) 100 MG IJ SOLR: 50.0000 mg | Freq: Two times a day (BID) | INTRAMUSCULAR | Status: DC

## 2024-03-05 NOTE — Plan of Care (Signed)
   Problem: Clinical Measurements: Goal: Ability to maintain clinical measurements within normal limits will improve Outcome: Progressing Goal: Diagnostic test results will improve Outcome: Progressing   Problem: Safety: Goal: Ability to remain free from injury will improve Outcome: Progressing

## 2024-03-05 NOTE — Progress Notes (Signed)
 NAME:  DARWYN PONZO, MRN:  [id number], DOB:  [birth year], LOS: 1 ADMISSION DATE:  03/04/2024, CONSULTATION DATE:  03/04/24 REFERRING MD:  Mannie CHIEF COMPLAINT:  Dyspnea, AMS  History of Present Illness:  Marc [name] is a 68 y.o. male who has a PMH as outlined below including but is limited to stage IV clear-cell renal cell carcinoma (status post left radical nephrectomy 2021 as well as cryoablation to 2 lesions of the right kidney completed in 2023) with pulmonary involvement (s/p radiation completed 2023) as well as pituitary metastasis diagnosed in 2023 (s/p transsphenoidal resection and SRS in 2023).  He is currently undergoing immunotherapy.  He presented to [facility] ED on 12/3 with dyspnea, fatigue, chills, cough.  Symptoms began about 4 to 5 days ago and progressed.  He normally walks at least 6000 steps every day and has been doing so for over 500 days; however on day of presentation, he was unable to do so.  EMS was called and he was found to be hypoxic to 80s on room air.  After arrival to the ED, he was noted to be hypotensive.  He received 3 L of fluids and had little improvement; therefore, he was started on norepinephrine.  He was also hypoxic and required HHFNC at 40L/min with 60% FiO2. CXR suggestive of left basilar pneumonia.  Initial lactic was elevated to 8.3 with repeat down to 3.3 after fluids.  Given his pressor requirements, PCCM was called for admission.  He denies any prior history of pneumonia.  No recent hospitalizations.  He was in his otherwise usual state of health up until about 4 to 5 days before this.  As above, he normally walks 6000 steps a without any difficulties.  He has not had any recent illnesses as not been exposed anyone sick recently. He does have a cough with occasional sputum that he has been swallowing.  Pertinent  Medical History:  has Renal mass; Unilateral primary osteoarthritis, left hip; Status post total replacement of left hip; Unilateral primary  osteoarthritis, left knee; Unilateral primary osteoarthritis, right knee; Kidney cancer, primary, with metastasis from kidney to other site [facility]); Coronary artery disease; Gallstones; Hardening of the aorta (main artery of the heart); History of hypercholesterolemia; Low testosterone ; Osteoarthritis; Other specified nonpsychotic mental disorders; History of colonic polyps; Personal history of other malignant neoplasm of kidney; Pure hypercholesterolemia; Tobacco use; Secondary renal cell carcinoma of lung, right (HCC); Renal cell carcinoma of right kidney (HCC); Secondary adrenal insufficiency; Low testosterone  in male; Pituitary macroadenoma (HCC); Status post transsphenoidal pituitary resection; Metastasis to Pituitary [facility]); Encounter for antineoplastic immunotherapy; and Septic shock (HCC) on their problem list.   Significant Hospital Events: Including procedures, antibiotic start and stop dates in addition to other pertinent events   12/3 admit, CVL and art line placed due to escalating pressor needs up to 40 NE  12/4 NE off, vaso being weaned  Interim History / Subjective:  On HHFNC at 35LPM with 49% O2 but actively weaning. Feels better he thinks. Not coughing much.   Objective:  Blood pressure (!) 91/55, pulse 76, temperature (!) 97.5 F (36.4 C), temperature source Axillary, resp. rate 16, height 5' 9 (1.753 m), weight 104.3 kg, SpO2 99%. CVP:  [14 mmHg-23 mmHg] 21 mmHg  FiO2 (%):  [49 %-75 %] 49 %   Intake/Output Summary (Last 24 hours) at 03/05/2024 0834 Last data filed at 03/05/2024 0741 Gross per 24 hour  Intake 10289.78 ml  Output 350 ml  Net 9939.78 ml  Filed Weights   03/04/24 1108  Weight: 104.3 kg     Physical Exam: General: Adult male, resting in bed, in NAD. Neuro: A&O x 3, no deficits. HEENT: [place]/AT. Sclerae anicteric. EOMI. MM dry. Cardiovascular: RRR, no M/R/G.  Lungs: Respirations even and unlabored.  CTA bilaterally, No W/R/R. Abdomen: BS x 4, soft,  NT/ND.  Musculoskeletal: No gross deformities, no edema.    Labs/imaging personally reviewed:  CXR 12/3 > possible basilar PNA.  Assessment & Plan:   Septic shock - 2/2 Basilar PNA, community acquired. - Continue MIVF. - Continue vaso, start to wean off (now off NE and doing well). - Continue CTX, Azithromycin . - Continue stress steroids.  Acute hypoxic respiratory failure - 2/2 CAP as above. - Continue HHFNC, wean as able. Goal SpO2 > 92%. - Abx as above. - Bronchial hygiene.  AKI. Lactic acidosis. - Fluids as above. - Follow BMP.  Hx hypothyroidism. - Continue PTA Synthroid .  Hx HLD. - Continue PTA Rosuvastatin .  Hyperglycemia - 2/2 steroids. - SSI. - Assess Hgb A1c.  Hx stage IV clear-cell RCC (status post left radical nephrectomy 2021 as well as cryoablation to 2 lesions of the right kidney completed in 2023) with pulmonary involvement (s/p radiation completed 2023) as well as pituitary metastasis diagnosed in 2023 (s/p transsphenoidal resection and SRS in 2023).  - Hold PTA Inlyta  for now. - F/u with oncology as an outpatient.   CC time: 30 min.   Sammi Gore, PA - [facility] For pager details, please see AMION or use Epic chat  After 1900, please call [facility] for cross coverage needs 03/05/2024, 8:54 AM

## 2024-03-06 DIAGNOSIS — J9601 Acute respiratory failure with hypoxia: Secondary | ICD-10-CM

## 2024-03-06 DIAGNOSIS — E039 Hypothyroidism, unspecified: Secondary | ICD-10-CM

## 2024-03-06 DIAGNOSIS — R739 Hyperglycemia, unspecified: Secondary | ICD-10-CM

## 2024-03-06 DIAGNOSIS — N179 Acute kidney failure, unspecified: Secondary | ICD-10-CM | POA: Diagnosis not present

## 2024-03-06 DIAGNOSIS — E785 Hyperlipidemia, unspecified: Secondary | ICD-10-CM

## 2024-03-06 DIAGNOSIS — A419 Sepsis, unspecified organism: Secondary | ICD-10-CM | POA: Diagnosis not present

## 2024-03-06 DIAGNOSIS — R6521 Severe sepsis with septic shock: Secondary | ICD-10-CM | POA: Diagnosis not present

## 2024-03-06 DIAGNOSIS — J189 Pneumonia, unspecified organism: Secondary | ICD-10-CM

## 2024-03-06 LAB — BASIC METABOLIC PANEL WITH GFR
Anion gap: 14 (ref 5–15)
BUN: 25 mg/dL — ABNORMAL HIGH (ref 8–23)
CO2: 25 mmol/L (ref 22–32)
Calcium: 8.1 mg/dL — ABNORMAL LOW (ref 8.9–10.3)
Chloride: 99 mmol/L (ref 98–111)
Creatinine, Ser: 1.64 mg/dL — ABNORMAL HIGH (ref 0.61–1.24)
GFR, Estimated: 46 mL/min — ABNORMAL LOW (ref >60)
Glucose, Bld: 98 mg/dL (ref 70–99)
Potassium: 4.3 mmol/L (ref 3.5–5.1)
Sodium: 138 mmol/L (ref 135–145)

## 2024-03-06 LAB — CBC
HCT: 33.3 % — ABNORMAL LOW (ref 39.0–52.0)
Hemoglobin: 10.9 g/dL — ABNORMAL LOW (ref 13.0–17.0)
MCH: 30.4 pg (ref 26.0–34.0)
MCHC: 32.7 g/dL (ref 30.0–36.0)
MCV: 93 fL (ref 80.0–100.0)
Platelets: 155 K/uL (ref 150–400)
RBC: 3.58 MIL/uL — ABNORMAL LOW (ref 4.22–5.81)
RDW: 15.7 % — ABNORMAL HIGH (ref 11.5–15.5)
WBC: 18.5 K/uL — ABNORMAL HIGH (ref 4.0–10.5)
nRBC: 0.3 % — ABNORMAL HIGH (ref 0.0–0.2)

## 2024-03-06 LAB — PHOSPHORUS: Phosphorus: 5.8 mg/dL — ABNORMAL HIGH (ref 2.5–4.6)

## 2024-03-06 LAB — MAGNESIUM: Magnesium: 1.9 mg/dL (ref 1.7–2.4)

## 2024-03-06 MED ORDER — GUAIFENESIN-DM 100-10 MG/5ML PO SYRP
5.0000 mL | ORAL_SOLUTION | Freq: Four times a day (QID) | ORAL | Status: DC | PRN
  Administered 2024-03-06 – 2024-03-07 (×2): 5 mL via ORAL
  Filled 2024-03-06 (×2): qty 5

## 2024-03-06 MED ORDER — AZITHROMYCIN 250 MG PO TABS
500.0000 mg | ORAL_TABLET | Freq: Every day | ORAL | Status: DC
  Administered 2024-03-07: 500 mg via ORAL
  Filled 2024-03-06: qty 2

## 2024-03-06 NOTE — Progress Notes (Signed)
 [facility] PROGRESS NOTE    Progress Note  Marc [name]  FMW:[id number] DOB: 12-18-1955 DOA: 03/04/2024 PCP: Alvera Reagin, PA     Brief Narrative:   Marc [name] is an 68 y.o. male past medical history stage IV clear-cell renal cell carcinoma status post left radical nephrectomy in 2021 as well as cryoablation in 2 lesions in the right kidney completed in 2023 with pulmonary involvement s/p radiation therapy completed in 2023, with pituitary metastases status post transsphenoidal resection in 2023 currently on immunotherapy.  Presented to was along with dyspnea and fatigue chills and cough was initially hypotensive in the ED fluid resuscitated and remained hypotensive started on norepinephrine  required high flow nasal cannula chest x-ray showed left basilar infiltrates with a lactate of 8 admitted to the ICU wean off pressors.  Assessment/Plan:   Severe septic shock (HCC) secondary to community-acquired pneumonia: Started on Rocephin  and azithromycin  IV pressors. IV pressors has been weaned off. Was also initially started on stress dose steroids.  And has been weaned to his home dose. Has remained in afebrile and leukocytosis improving. Blood cultures have been negative till date.  Acute respiratory failure with hypoxia likely due to his pneumonia: Initially requiring high flow nasal cannula Has been weaned down to room air can transfer to MedSurg.  Acute kidney injury: With a baseline creatinine of 1, on admission 2 started on pressors and IV fluids creatinine has improved to 1.6. Continue IV fluids gently and recheck a basic metabolic panel in the morning.  Hyperglycemia: Likely due to stress to steroids, his blood glucose improved this morning.  Hypothyroidism: Continue Synthroid .  Hyperlipidemia:  continue statins.  Stage IV renal cell carcinoma: Holding Inlyta  follow-up with oncology as an outpatient  Secondary adrenal insufficiency Back on his home dose  steroids.  Normocytic anemia: Previously his hemoglobin was ranging anywhere from 13-14. This morning is 11. Recheck CBC tomorrow morning.  Hyperphosphatemia: On no supplementation, started on IV fluids recheck basic metabolic panel tomorrow.  DVT prophylaxis: lovenox  Family Communication:none Status is: Inpatient Remains inpatient appropriate because: Septic shock severe, transferred to MedSurg    Code Status:     Code Status Orders  (From admission, onward)           Start     Ordered   03/04/24 1404  Full code  Continuous       Question:  By:  Answer:  Consent: discussion documented in EHR   03/04/24 1405           Code Status History     Date Active Date Inactive Code Status Order ID Comments User Context   11/08/2021 1319 11/11/2021 1959 Full Code [id number]  Cheryle Debby LABOR, MD Inpatient   03/31/2020 1642 04/01/2020 1717 Full Code [id number]  Vernetta Lonni GRADE, MD Inpatient   01/05/2020 1427 01/06/2020 1938 Full Code [id number]  Cory Palma, PA-C Inpatient         IV Access:   Peripheral IV   Procedures and diagnostic studies:   DG CHEST PORT 1 VIEW Result Date: 03/04/2024 EXAM: 1 VIEW(S) XRAY OF THE CHEST 03/04/2024 05:46:00 PM COMPARISON: 03/04/2024 CLINICAL HISTORY: Central venous catheter in place. FINDINGS: LINES, TUBES AND DEVICES: Left internal jugular central venous catheter in place with tip overlying the right atrium. LUNGS AND PLEURA: Low lung volumes. Bilateral lower lung bandlike atelectasis/scarring. No pleural effusion. No pneumothorax. Left costophrenic angle excluded from the image. HEART AND MEDIASTINUM: No acute abnormality of the cardiac and mediastinal silhouettes. BONES AND SOFT  TISSUES: No acute osseous abnormality. IMPRESSION: 1. Left internal jugular central venous catheter with tip overlying the right atrium. 2. Low lung volumes with bilateral lower lung bandlike atelectasis/scarring. Electronically signed by: Norman Gatlin  MD 03/04/2024 07:00 PM EST RP Workstation: [id number]   DG Chest Port 1 View Result Date: 03/04/2024 CLINICAL DATA:  Sepsis. EXAM: PORTABLE CHEST 1 VIEW COMPARISON:  03/16/2021 FINDINGS: The heart size and mediastinal contours are within normal limits. Very low lung volumes with bibasilar atelectasis. Difficult to exclude component of pneumonia, especially at the right lung base. No pulmonary edema or pleural fluid identified. No pneumothorax. The visualized skeletal structures are unremarkable. IMPRESSION: Very low lung volumes with bibasilar atelectasis. Difficult to exclude component of pneumonia, especially at the right lung base. Electronically Signed   By: Marcey Moan M.D.   On: 03/04/2024 13:10     Medical Consultants:   None.   Subjective:    Marc [name] feels great wants to get up and go home, also relates he is hungry.  Objective:    Vitals:   03/06/24 0300 03/06/24 0342 03/06/24 0400 03/06/24 0500  BP: 98/60  117/68 134/73  Pulse: 66  66 70  Resp: 12  18 20   Temp:  97.8 F (36.6 C)    TempSrc:  Oral    SpO2: 92%  93% 93%  Weight:      Height:       SpO2: 93 % O2 Flow Rate (L/min): (S) 4 L/min FiO2 (%): 40 %   Intake/Output Summary (Last 24 hours) at 03/06/2024 9378 Last data filed at 03/06/2024 0342 Gross per 24 hour  Intake 2478.12 ml  Output 2000 ml  Net 478.12 ml   Filed Weights   03/04/24 1108  Weight: 104.3 kg    Exam: General exam: In no acute distress. Respiratory system: Good air movement and clear to auscultation. Cardiovascular system: S1 & S2 heard, RRR. No JVD. Gastrointestinal system: Abdomen is nondistended, soft and nontender.  Extremities: No pedal edema. Skin: No rashes, lesions or ulcers Psychiatry: Judgement and insight appear normal. Mood & affect appropriate.    Data Reviewed:    Labs: Basic Metabolic Panel: Recent Labs  Lab 03/03/24 0753 03/04/24 1141 03/04/24 1804 03/05/24 0434 03/06/24 0440  NA 142 142  --   133* 138  K 4.1 3.5  --  4.6 4.3  CL 102 103  --  102 99  CO2 26 24  --  19* 25  GLUCOSE 86 90  --  220* 98  BUN 19 19  --  23 25*  CREATININE 1.22 2.12* 2.09* 1.93* 1.64*  CALCIUM  9.7 8.7*  --  7.1* 8.1*  MG  --   --   --   --  1.9  PHOS  --   --   --   --  5.8*   GFR Estimated Creatinine Clearance: 52.7 mL/min (A) (by C-G formula based on SCr of 1.64 mg/dL (H)). Liver Function Tests: Recent Labs  Lab 03/03/24 0753 03/04/24 1141  AST 43* 37  ALT 73* 53*  ALKPHOS 73 65  BILITOT 0.5 0.7  PROT 7.6 6.1*  ALBUMIN  4.4 3.6   No results for input(s): LIPASE, AMYLASE in the last 168 hours. No results for input(s): AMMONIA in the last 168 hours. Coagulation profile Recent Labs  Lab 03/04/24 1141  INR 1.1   COVID-19 Labs  No results for input(s): DDIMER, FERRITIN, LDH, CRP in the last 72 hours.  Lab Results  Component Value Date  SARSCOV2NAA NEGATIVE 03/04/2024   SARSCOV2NAA NEGATIVE 03/29/2020   SARSCOV2NAA NEGATIVE 01/01/2020    CBC: Recent Labs  Lab 03/03/24 0753 03/04/24 1141 03/04/24 1804 03/05/24 0434 03/06/24 0440  WBC 10.3 17.9* 25.8* 20.0* 18.5*  NEUTROABS 7.0 14.1*  --   --   --   HGB 14.9 15.1 14.0 13.1 10.9*  HCT 45.7 47.9 42.5 41.0 33.3*  MCV 91.2 94.7 92.0 95.1 93.0  PLT 206 269 262 201 155   Cardiac Enzymes: No results for input(s): CKTOTAL, CKMB, CKMBINDEX, TROPONINI in the last 168 hours. BNP (last 3 results) No results for input(s): PROBNP in the last 8760 hours. CBG: Recent Labs  Lab 03/05/24 0921 03/05/24 1204 03/05/24 1522  GLUCAP 155* 88 82   D-Dimer: No results for input(s): DDIMER in the last 72 hours. Hgb A1c: No results for input(s): HGBA1C in the last 72 hours. Lipid Profile: No results for input(s): CHOL, HDL, LDLCALC, TRIG, CHOLHDL, LDLDIRECT in the last 72 hours. Thyroid  function studies: Recent Labs    03/03/24 0752 03/03/24 0753  TSH <0.100*  --   T4TOTAL  --  5.5    Anemia work up: No results for input(s): VITAMINB12, FOLATE, FERRITIN, TIBC, IRON, RETICCTPCT in the last 72 hours. Sepsis Labs: Recent Labs  Lab 03/04/24 1141 03/04/24 1144 03/04/24 1328 03/04/24 1804 03/05/24 0434 03/06/24 0440  PROCALCITON  --   --   --  7.18  --   --   WBC 17.9*  --   --  25.8* 20.0* 18.5*  LATICACIDVEN  --  8.3* 3.3*  --   --   --    Microbiology Recent Results (from the past 240 hours)  Blood Culture (routine x 2)     Status: None (Preliminary result)   Collection Time: 03/04/24 11:33 AM   Specimen: BLOOD  Result Value Ref Range Status   Specimen Description   Final    BLOOD RIGHT ANTECUBITAL Performed at [facility], [address] [place], [place] [address]    Special Requests   Final    BOTTLES DRAWN AEROBIC AND ANAEROBIC Blood Culture adequate volume Performed at [facility], [address], [place], [place] [address]    Culture   Final    NO GROWTH < 24 HOURS Performed at [facility], [address] [place], [place] [address]    Report Status PENDING  Incomplete  Resp panel by RT-PCR (RSV, Flu A&B, Covid) Anterior Nasal Swab     Status: None   Collection Time: 03/04/24 12:18 PM   Specimen: Anterior Nasal Swab  Result Value Ref Range Status   SARS Coronavirus 2 by RT PCR NEGATIVE NEGATIVE Final    Comment: (NOTE) SARS-CoV-2 target nucleic acids are NOT DETECTED.  The SARS-CoV-2 RNA is generally detectable in upper respiratory specimens during the acute phase of infection. The lowest concentration of SARS-CoV-2 viral copies this assay can detect is 138 copies/mL. A negative result does not preclude SARS-Cov-2 infection and should not be used as the sole basis for treatment or other patient management decisions. A negative result may occur with  improper specimen collection/handling, submission of specimen other than nasopharyngeal swab, presence of viral mutation(s) within  the areas targeted by this assay, and inadequate number of viral copies(<138 copies/mL). A negative result must be combined with clinical observations, patient history, and epidemiological information. The expected result is Negative.  Fact Sheet for Patients:  bloggercourse.com  Fact Sheet for Healthcare Providers:  seriousbroker.it  This  test is no t yet approved or cleared by the [place]  FDA and  has been authorized for detection and/or diagnosis of SARS-CoV-2 by FDA under an Emergency Use Authorization (EUA). This EUA will remain  in effect (meaning this test can be used) for the duration of the COVID-19 declaration under Section 564(b)(1) of the Act, 21 U.S.C.section 360bbb-3(b)(1), unless the authorization is terminated  or revoked sooner.       Influenza A by PCR NEGATIVE NEGATIVE Final   Influenza B by PCR NEGATIVE NEGATIVE Final    Comment: (NOTE) The Xpert Xpress SARS-CoV-2/FLU/RSV plus assay is intended as an aid in the diagnosis of influenza from Nasopharyngeal swab specimens and should not be used as a sole basis for treatment. Nasal washings and aspirates are unacceptable for Xpert Xpress SARS-CoV-2/FLU/RSV testing.  Fact Sheet for Patients: bloggercourse.com  Fact Sheet for Healthcare Providers: seriousbroker.it  This test is not yet approved or cleared by the [place]  FDA and has been authorized for detection and/or diagnosis of SARS-CoV-2 by FDA under an Emergency Use Authorization (EUA). This EUA will remain in effect (meaning this test can be used) for the duration of the COVID-19 declaration under Section 564(b)(1) of the Act, 21 U.S.C. section 360bbb-3(b)(1), unless the authorization is terminated or revoked.     Resp Syncytial Virus by PCR NEGATIVE NEGATIVE Final    Comment: (NOTE) Fact Sheet for  Patients: bloggercourse.com  Fact Sheet for Healthcare Providers: seriousbroker.it  This test is not yet approved or cleared by the [place]  FDA and has been authorized for detection and/or diagnosis of SARS-CoV-2 by FDA under an Emergency Use Authorization (EUA). This EUA will remain in effect (meaning this test can be used) for the duration of the COVID-19 declaration under Section 564(b)(1) of the Act, 21 U.S.C. section 360bbb-3(b)(1), unless the authorization is terminated or revoked.  Performed at [facility], [address], [place], [place] [address]   Culture, blood (Routine X 2) w Reflex to ID Panel     Status: None (Preliminary result)   Collection Time: 03/04/24  6:05 PM   Specimen: BLOOD LEFT HAND  Result Value Ref Range Status   Specimen Description   Final    BLOOD LEFT HAND Performed at [facility], [address] [place], [place] [address]    Special Requests   Final    BOTTLES DRAWN AEROBIC ONLY Blood Culture results may not be optimal due to an inadequate volume of blood received in culture bottles Performed at [facility], [address] [place], [place] [address]    Culture   Final    NO GROWTH < 12 HOURS Performed at [facility], [address] [place], [place] [address]    Report Status PENDING  Incomplete  Respiratory (~20 pathogens) panel by PCR     Status: Abnormal   Collection Time: 03/05/24  8:57 AM   Specimen: Nasopharyngeal Swab; Respiratory  Result Value Ref Range Status   Adenovirus NOT DETECTED NOT DETECTED Final   Coronavirus 229E NOT DETECTED NOT DETECTED Final    Comment: (NOTE) The Coronavirus on the Respiratory Panel, DOES NOT test for the novel  Coronavirus (2019 nCoV)    Coronavirus HKU1 NOT DETECTED NOT DETECTED Final   Coronavirus NL63 NOT DETECTED NOT DETECTED Final   Coronavirus OC43 NOT DETECTED NOT DETECTED Final    Metapneumovirus NOT DETECTED NOT DETECTED Final   Rhinovirus / Enterovirus DETECTED (A) NOT DETECTED Final  Influenza A NOT DETECTED NOT DETECTED Final   Influenza B NOT DETECTED NOT DETECTED Final   Parainfluenza Virus 1 NOT DETECTED NOT DETECTED Final   Parainfluenza Virus 2 NOT DETECTED NOT DETECTED Final   Parainfluenza Virus 3 NOT DETECTED NOT DETECTED Final   Parainfluenza Virus 4 NOT DETECTED NOT DETECTED Final   Respiratory Syncytial Virus NOT DETECTED NOT DETECTED Final   Bordetella pertussis NOT DETECTED NOT DETECTED Final   Bordetella Parapertussis NOT DETECTED NOT DETECTED Final   Chlamydophila pneumoniae NOT DETECTED NOT DETECTED Final   Mycoplasma pneumoniae NOT DETECTED NOT DETECTED Final    Comment: Performed at [facility], [address] [place], [place] [address]     Medications:    aspirin  EC  81 mg Oral QPM   Chlorhexidine  Gluconate Cloth  6 each Topical Q2200   enoxaparin  (LOVENOX ) injection  40 mg Subcutaneous Daily   levothyroxine   88 mcg Oral Q0600   multivitamin with minerals  1 tablet Oral QPM   omega-3 acid ethyl esters  1 g Oral Daily   predniSONE   7.5 mg Oral Q breakfast   rosuvastatin   40 mg Oral Daily   vitamin E   400 Units Oral QPM   Continuous Infusions:  azithromycin  Stopped (03/05/24 1040)   cefTRIAXone  (ROCEPHIN )  IV Stopped (03/06/24 0003)   lactated ringers  125 mL/hr at 03/06/24 0016   norepinephrine  (LEVOPHED ) Adult infusion Stopped (03/05/24 0739)   vasopressin  Stopped (03/05/24 1011)      LOS: 2 days   Erle Odell Castor  [facility]  03/06/2024, 6:21 AM

## 2024-03-06 NOTE — Discharge Instructions (Signed)
 UTILITIES [facility] [address] [place] [phone] Rental assistance/rental hotline: [phone] ext. [phone].    Utility assistance/utility hotline: [phone] ext. [address] Department of IT consultant (heating/cooling and water assistance) [phone] (rental and utility assistance) [phone]  [employer] - call 211

## 2024-03-06 NOTE — Evaluation (Signed)
 Physical Therapy Evaluation Patient Details Name: Marc [name] MRN: [id number] DOB: October 17, 1955 Today's Date: 03/06/2024  History of Present Illness  68 y.o. male Presented with dyspnea and fatigue chills and cough was initially hypotensive. Dx of shock, PNA, acute respiratory failure. 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   Dx of shock, PNA, acute respiratory failure.  Clinical Impression  Pt is mobilizing well. SpO2 95% on room air at rest. He ambulated 150' with RW, no loss of balance, 2/4 dyspnea. Pt is modified independent with mobility, no further PT indicated, PT signing off. Encouraged pt to be up in recliner several hours/day and to ambulate 2-3x/day to minimize deconditioning during hospitalization.  Mobility team to follow.         If plan is discharge home, recommend the following:     Can travel by private vehicle        Equipment Recommendations None recommended by PT  Recommendations for Other Services       Functional Status Assessment Patient has not had a recent decline in their functional status     Precautions / Restrictions Precautions Precautions: None Recall of Precautions/Restrictions: Intact Restrictions Weight Bearing Restrictions Per Provider Order: No      Mobility  Bed Mobility Overal bed mobility: Independent                  Transfers Overall transfer level: Independent                      Ambulation/Gait Ambulation/Gait assistance: Independent Gait Distance (Feet): 150 Feet Assistive device: Rolling walker (2 wheels) Gait Pattern/deviations: WFL(Within Functional Limits) Gait velocity: WFL     General Gait  Details: steady, no loss of balance, 2/4 dyspnea  Stairs            Wheelchair Mobility     Tilt Bed    Modified Rankin (Stroke Patients Only)       Balance                                             Pertinent Vitals/Pain Pain Assessment Pain Assessment: No/denies pain    Home Living Family/patient expects to be discharged to:: Private residence Living Arrangements: Spouse/significant other Available Help at Discharge: Family;Available 24 hours/day Type of Home: House Home Access: Level entry       Home Layout: Able to live on main level with bedroom/bathroom;Two level Home Equipment: Agricultural Consultant (2 wheels);Rollator (4 wheels);Wheelchair - Manufacturing Systems Engineer      Prior Function Prior Level of Function : Independent/Modified Independent             Mobility Comments: walks without AD, denies falls in past 6 months other than tripping on a vine in the woods 1x ADLs Comments: independent     Extremity/Trunk Assessment   Upper Extremity Assessment Upper Extremity Assessment: Overall WFL for tasks assessed    Lower Extremity Assessment Lower Extremity Assessment: Overall WFL for tasks assessed    Cervical / Trunk Assessment Cervical / Trunk Assessment: Normal  Communication   Communication Communication: No apparent difficulties    Cognition  Arousal: Alert Behavior During Therapy: WFL for tasks assessed/performed   PT - Cognitive impairments: No apparent impairments                         Following commands: Intact       Cueing       General Comments      Exercises     Assessment/Plan    PT Assessment Patient does not need any further PT services  PT Problem List         PT Treatment Interventions      PT Goals (Current goals can be found in the Care Plan section)  Acute Rehab PT Goals PT Goal Formulation: All assessment and education complete, DC therapy    Frequency        Co-evaluation               AM-PAC PT 6 Clicks Mobility  Outcome Measure Help needed turning from your back to your side while in a flat bed without using bedrails?: None Help needed moving from lying on your back to sitting on the side of a flat bed without using bedrails?: None Help needed moving to and from a bed to a chair (including a wheelchair)?: None Help needed standing up from a chair using your arms (e.g., wheelchair or bedside chair)?: None Help needed to walk in hospital room?: None Help needed climbing 3-5 steps with a railing? : None 6 Click Score: 24    End of Session Equipment Utilized During Treatment: Gait belt Activity Tolerance: Patient tolerated treatment well Patient left: in chair;with call bell/phone within reach;with nursing/sitter in room Nurse Communication: Mobility status      Time: 8675-8660 PT Time Calculation (min) (ACUTE ONLY): 15 min   Charges:   PT Evaluation $PT Eval Low Complexity: 1 Low   PT General Charges $$ ACUTE PT VISIT: 1 Visit        Sylvan Delon Copp PT 03/06/2024  Acute Rehabilitation Services  Office [phone]

## 2024-03-06 NOTE — Progress Notes (Signed)
   03/06/24 1053  TOC Brief Assessment  Insurance and Status Reviewed  Patient has primary care physician Yes  Home environment has been reviewed Single family home  Prior level of function: Independent with ADL's  Prior/Current Home Services No current home services  Social Drivers of Health Review SDOH reviewed needs interventions;SDOH reviewed interventions complete (Resources added to AVS)  Readmission risk has been reviewed Yes  Transition of care needs transition of care needs identified, TOC will continue to follow

## 2024-03-06 NOTE — Plan of Care (Signed)

## 2024-03-07 DIAGNOSIS — N179 Acute kidney failure, unspecified: Secondary | ICD-10-CM | POA: Diagnosis not present

## 2024-03-07 DIAGNOSIS — A419 Sepsis, unspecified organism: Secondary | ICD-10-CM | POA: Diagnosis not present

## 2024-03-07 DIAGNOSIS — J9601 Acute respiratory failure with hypoxia: Secondary | ICD-10-CM | POA: Diagnosis not present

## 2024-03-07 DIAGNOSIS — R6521 Severe sepsis with septic shock: Secondary | ICD-10-CM | POA: Diagnosis not present

## 2024-03-07 LAB — RENAL FUNCTION PANEL
Albumin: 3.5 g/dL (ref 3.5–5.0)
Anion gap: 12 (ref 5–15)
BUN: 29 mg/dL — ABNORMAL HIGH (ref 8–23)
CO2: 26 mmol/L (ref 22–32)
Calcium: 9.1 mg/dL (ref 8.9–10.3)
Chloride: 105 mmol/L (ref 98–111)
Creatinine, Ser: 1.33 mg/dL — ABNORMAL HIGH (ref 0.61–1.24)
GFR, Estimated: 59 mL/min — ABNORMAL LOW (ref >60)
Glucose, Bld: 66 mg/dL — ABNORMAL LOW (ref 70–99)
Phosphorus: 1.9 mg/dL — ABNORMAL LOW (ref 2.5–4.6)
Potassium: 4.1 mmol/L (ref 3.5–5.1)
Sodium: 143 mmol/L (ref 135–145)

## 2024-03-07 LAB — CBC
HCT: 40.3 % (ref 39.0–52.0)
Hemoglobin: 12.8 g/dL — ABNORMAL LOW (ref 13.0–17.0)
MCH: 30.3 pg (ref 26.0–34.0)
MCHC: 31.8 g/dL (ref 30.0–36.0)
MCV: 95.5 fL (ref 80.0–100.0)
Platelets: 175 K/uL (ref 150–400)
RBC: 4.22 MIL/uL (ref 4.22–5.81)
RDW: 16 % — ABNORMAL HIGH (ref 11.5–15.5)
WBC: 15 K/uL — ABNORMAL HIGH (ref 4.0–10.5)
nRBC: 1.7 % — ABNORMAL HIGH (ref 0.0–0.2)

## 2024-03-07 MED ORDER — AZITHROMYCIN 250 MG PO TABS: ORAL_TABLET | ORAL | 0 refills | Status: AC

## 2024-03-07 MED ORDER — AMOXICILLIN-POT CLAVULANATE 875-125 MG PO TABS: 1.0000 | ORAL_TABLET | Freq: Two times a day (BID) | ORAL | 0 refills | Status: AC

## 2024-03-07 MED ORDER — HYDROCODONE-ACETAMINOPHEN 10-325 MG PO TABS: 1.0000 | ORAL_TABLET | ORAL | 0 refills | Status: AC | PRN

## 2024-03-07 MED ORDER — AMOXICILLIN-POT CLAVULANATE 875-125 MG PO TABS: 1.0000 | ORAL_TABLET | Freq: Two times a day (BID) | ORAL | Status: DC

## 2024-03-07 NOTE — Plan of Care (Signed)

## 2024-03-07 NOTE — Discharge Summary (Signed)
 Physician Discharge Summary  Marc [name] FMW:[id number] DOB: [birth year] DOA: 03/04/2024  PCP: Alvera Reagin, PA  Admit date: 03/04/2024 Discharge date: 03/07/2024  Admitted From: Home Disposition:  Home  Recommendations for Outpatient Follow-up:  Follow up with PCP in 1-2 weeks Please obtain BMP/CBC in one week   Home Health:No Equipment/Devices:None  Discharge Condition:Stable CODE STATUS:Full Diet recommendation: Heart Healthy   Brief/Interim Summary: 68 y.o. male past medical history stage IV clear-cell renal cell carcinoma status post left radical nephrectomy in 2021 as well as cryoablation in 2 lesions in the right kidney completed in 2023 with pulmonary involvement s/p radiation therapy completed in 2023, with pituitary metastases status post transsphenoidal resection in 2023 currently on immunotherapy.  Presented to was along with dyspnea and fatigue chills and cough was initially hypotensive in the ED fluid resuscitated and remained hypotensive started on norepinephrine  required high flow nasal cannula chest x-ray showed left basilar infiltrates with a lactate of 8 admitted to the ICU wean off pressors.   Discharge Diagnoses:  Principal Problem:   Septic shock (HCC) Active Problems:   Secondary adrenal insufficiency   Left lower lobe pneumonia   Hyperglycemia   Acute respiratory failure with hypoxia (HCC)   AKI (acute kidney injury)   Hypothyroidism   Hyperlipidemia  Severe septic shock secondary to community-acquired pneumonia: Sign start empirically on antibiotics and pressors on admission. CCM admitted the patient. They were able to wean him off. He was given stress dose steroids. Will transition to oral Augmentin  and azithromycin  which should continue as an outpatient. He has been placed on his home dose of steroids.  Acute respiratory failure with hypoxia: Likely due to pneumonia, initially requiring high flow nasal cannula  were able to wean him down to room  air. PT evaluated the patient recommended home health PT.  Acute kidney injury: Likely due to sepsis with baseline creatinine around 1 started on pressors and IV fluids his creatinine returned to baseline.  Hyperglycemia: Slightly due to steroids now improved.  Hypothyroidism: Continue Synthroid .  Hyperlipidemia: Continue statin.  Stage IV renal cell carcinoma: Will resume immunotherapy as an outpatient.  Secondary adrenal insufficiency: Back on his home dose steroids, no changes made to his medication.  Normocytic anemia, Hemoglobin remained stable.  Hypophosphatemia: Phosphorus is improved today.     Discharge Instructions  Discharge Instructions     Diet - low sodium heart healthy   Complete by: As directed    Increase activity slowly   Complete by: As directed       Allergies as of 03/07/2024   No Known Allergies      Medication List     TAKE these medications    amoxicillin -clavulanate 875-125 MG tablet Commonly known as: AUGMENTIN  Take 1 tablet by mouth every 12 (twelve) hours for 4 days.   aspirin  EC 81 MG tablet Take 81 mg by mouth every evening. Swallow whole.   azithromycin  250 MG tablet Commonly known as: ZITHROMAX  Take 1 tab daily Start taking on: March 08, 2024   cholecalciferol  25 MCG (1000 UNIT) tablet Commonly known as: VITAMIN D3 Take 1,000 Units by mouth every evening.   FISH OIL PO Take 1 capsule by mouth every evening.   HYDROcodone -acetaminophen  10-325 MG tablet Commonly known as: NORCO Take 1 tablet by mouth every 4 (four) hours as needed for up to 3 days for moderate pain (pain score 4-6). What changed:  how much to take when to take this reasons to take this   Inlyta  5 MG  tablet Generic drug: axitinib  TAKE 1 TABLET BY MOUTH DAILY   IRON SLOW RELEASE PO Take 1 tablet by mouth every evening.   levothyroxine  88 MCG tablet Commonly known as: SYNTHROID  Take 1 tablet (88 mcg total) by mouth daily.   multivitamin  with minerals tablet Take 1 tablet by mouth every evening.   predniSONE  5 MG tablet Commonly known as: DELTASONE  TAKE 1 AND 1/2 TABLETS(7.5 MG) BY MOUTH DAILY WITH BREAKFAST. MAY DOUBLE UP DURING SICK DAYS   prochlorperazine  10 MG tablet Commonly known as: COMPAZINE  Take 1 tablet (10 mg total) by mouth every 6 (six) hours as needed for nausea or vomiting.   rosuvastatin  40 MG tablet Commonly known as: CRESTOR  Take 1 tablet (40 mg total) by mouth daily.   Testosterone  20.25 MG/1.25GM (1.62%) Gel Commonly known as: AndroGel  Place 3 Pump onto the skin as directed. What changed: when to take this   VITAMIN C PO Take 1 tablet by mouth every evening.   vitamin E  180 MG (400 UNITS) capsule Take 400 Units by mouth every evening.        No Known Allergies  Consultations: Pulmonary and critical care  Procedures/Studies: DG CHEST PORT 1 VIEW Result Date: 03/04/2024 EXAM: 1 VIEW(S) XRAY OF THE CHEST 03/04/2024 05:46:00 PM COMPARISON: 03/04/2024 CLINICAL HISTORY: Central venous catheter in place. FINDINGS: LINES, TUBES AND DEVICES: Left internal jugular central venous catheter in place with tip overlying the right atrium. LUNGS AND PLEURA: Low lung volumes. Bilateral lower lung bandlike atelectasis/scarring. No pleural effusion. No pneumothorax. Left costophrenic angle excluded from the image. HEART AND MEDIASTINUM: No acute abnormality of the cardiac and mediastinal silhouettes. BONES AND SOFT TISSUES: No acute osseous abnormality. IMPRESSION: 1. Left internal jugular central venous catheter with tip overlying the right atrium. 2. Low lung volumes with bilateral lower lung bandlike atelectasis/scarring. Electronically signed by: Norman Gatlin MD 03/04/2024 07:00 PM EST RP Workstation: [id number]   DG Chest Port 1 View Result Date: 03/04/2024 CLINICAL DATA:  Sepsis. EXAM: PORTABLE CHEST 1 VIEW COMPARISON:  03/16/2021 FINDINGS: The heart size and mediastinal contours are within normal  limits. Very low lung volumes with bibasilar atelectasis. Difficult to exclude component of pneumonia, especially at the right lung base. No pulmonary edema or pleural fluid identified. No pneumothorax. The visualized skeletal structures are unremarkable. IMPRESSION: Very low lung volumes with bibasilar atelectasis. Difficult to exclude component of pneumonia, especially at the right lung base. Electronically Signed   By: Marcey Moan M.D.   On: 03/04/2024 13:10   MR BRAIN W WO CONTRAST Result Date: 02/06/2024 EXAM: MRI BRAIN WITH AND WITHOUT CONTRAST 02/06/2024 11:46:50 AM TECHNIQUE: Multiplanar multisequence MRI of the head/brain was performed with and without the administration of intravenous contrast. COMPARISON: MR head without contrast 10/03/2023 and 05/30/2023. CLINICAL HISTORY: Brain/CNS neoplasm, assess treatment response. FINDINGS: BRAIN AND VENTRICLES: No acute infarct. No acute intracranial hemorrhage. No mass effect or midline shift. No hydrocephalus. Postoperative and radiative changes to the sella are stable. Stable enhancement is again noted along the residual pituitary stalk when deviated to the right. No significant residual recurrent enhancing pituitary tissue is present. Mild periventricular and scattered subcortical T2 hyperintensities are similar to the prior exam. Normal flow voids. ORBITS: No acute abnormality. SINUSES: Mild mucosal thickening is present in the left frontal sinus and bilateral ethmoid air cells. A small left mastoid effusion is present. No obstructing nasopharyngeal lesion is present. BONES AND SOFT TISSUES: Normal bone marrow signal and enhancement. No acute soft tissue abnormality. IMPRESSION: 1. Stable  postoperative and radiative changes to the sella with no significant residual or recurrent enhancing pituitary tissue. 2. Stable mild periventricular and scattered subcortical T2 hyperintensities. 3. Mild mucosal thickening in the left frontal sinus and bilateral  ethmoid air cells. Small left mastoid effusion. No obstructing nasopharyngeal lesion. Electronically signed by: Lonni Necessary MD 02/06/2024 12:18 PM EST RP Workstation: [id number]   (Echo, Carotid, EGD, Colonoscopy, ERCP)    Subjective: No complaints  Discharge Exam: Vitals:   03/06/24 2009 03/07/24 0526  BP: 119/81 (!) 149/95  Pulse: 76 78  Resp: 18 18  Temp: 97.6 F (36.4 C) 97.7 F (36.5 C)  SpO2: 97% 96%   Vitals:   03/06/24 1205 03/06/24 1411 03/06/24 2009 03/07/24 0526  BP: 126/73 118/79 119/81 (!) 149/95  Pulse: 79 73 76 78  Resp: 19 18 18 18   Temp:  (!) 97.5 F (36.4 C) 97.6 F (36.4 C) 97.7 F (36.5 C)  TempSrc:      SpO2: 94% 94% 97% 96%  Weight:      Height:        General: Pt is alert, awake, not in acute distress Cardiovascular: RRR, S1/S2 +, no rubs, no gallops Respiratory: CTA bilaterally, no wheezing, no rhonchi Abdominal: Soft, NT, ND, bowel sounds + Extremities: no edema, no cyanosis    The results of significant diagnostics from this hospitalization (including imaging, microbiology, ancillary and laboratory) are listed below for reference.     Microbiology: Recent Results (from the past 240 hours)  Blood Culture (routine x 2)     Status: None (Preliminary result)   Collection Time: 03/04/24 11:33 AM   Specimen: BLOOD  Result Value Ref Range Status   Specimen Description   Final    BLOOD RIGHT ANTECUBITAL Performed at [facility], [address], [place], [place] [address]    Special Requests   Final    BOTTLES DRAWN AEROBIC AND ANAEROBIC Blood Culture adequate volume Performed at [facility], [address] [place], [place] [address]    Culture   Final    NO GROWTH 3 DAYS Performed at [facility], [address] [place], [place] [address]    Report Status PENDING  Incomplete  Resp panel by RT-PCR (RSV, Flu A&B, Covid) Anterior Nasal Swab     Status: None   Collection Time:  03/04/24 12:18 PM   Specimen: Anterior Nasal Swab  Result Value Ref Range Status   SARS Coronavirus 2 by RT PCR NEGATIVE NEGATIVE Final    Comment: (NOTE) SARS-CoV-2 target nucleic acids are NOT DETECTED.  The SARS-CoV-2 RNA is generally detectable in upper respiratory specimens during the acute phase of infection. The lowest concentration of SARS-CoV-2 viral copies this assay can detect is 138 copies/mL. A negative result does not preclude SARS-Cov-2 infection and should not be used as the sole basis for treatment or other patient management decisions. A negative result may occur with  improper specimen collection/handling, submission of specimen other than nasopharyngeal swab, presence of viral mutation(s) within the areas targeted by this assay, and inadequate number of viral copies(<138 copies/mL). A negative result must be combined with clinical observations, patient history, and epidemiological information. The expected result is Negative.  Fact Sheet for Patients:  bloggercourse.com  Fact Sheet for Healthcare Providers:  seriousbroker.it  This test is no t yet approved or cleared by the [place]  FDA and  has been authorized for detection and/or diagnosis of SARS-CoV-2 by FDA under an Emergency Use Authorization (EUA).  This EUA will remain  in effect (meaning this test can be used) for the duration of the COVID-19 declaration under Section 564(b)(1) of the Act, 21 U.S.C.section 360bbb-3(b)(1), unless the authorization is terminated  or revoked sooner.       Influenza A by PCR NEGATIVE NEGATIVE Final   Influenza B by PCR NEGATIVE NEGATIVE Final    Comment: (NOTE) The Xpert Xpress SARS-CoV-2/FLU/RSV plus assay is intended as an aid in the diagnosis of influenza from Nasopharyngeal swab specimens and should not be used as a sole basis for treatment. Nasal washings and aspirates are unacceptable for Xpert Xpress  SARS-CoV-2/FLU/RSV testing.  Fact Sheet for Patients: bloggercourse.com  Fact Sheet for Healthcare Providers: seriousbroker.it  This test is not yet approved or cleared by the [place]  FDA and has been authorized for detection and/or diagnosis of SARS-CoV-2 by FDA under an Emergency Use Authorization (EUA). This EUA will remain in effect (meaning this test can be used) for the duration of the COVID-19 declaration under Section 564(b)(1) of the Act, 21 U.S.C. section 360bbb-3(b)(1), unless the authorization is terminated or revoked.     Resp Syncytial Virus by PCR NEGATIVE NEGATIVE Final    Comment: (NOTE) Fact Sheet for Patients: bloggercourse.com  Fact Sheet for Healthcare Providers: seriousbroker.it  This test is not yet approved or cleared by the [place]  FDA and has been authorized for detection and/or diagnosis of SARS-CoV-2 by FDA under an Emergency Use Authorization (EUA). This EUA will remain in effect (meaning this test can be used) for the duration of the COVID-19 declaration under Section 564(b)(1) of the Act, 21 U.S.C. section 360bbb-3(b)(1), unless the authorization is terminated or revoked.  Performed at [facility], [address] [place], [place] [address]   Culture, blood (Routine X 2) w Reflex to ID Panel     Status: None (Preliminary result)   Collection Time: 03/04/24  6:05 PM   Specimen: BLOOD LEFT HAND  Result Value Ref Range Status   Specimen Description   Final    BLOOD LEFT HAND Performed at [facility], [address], [place], [place] [address]    Special Requests   Final    BOTTLES DRAWN AEROBIC ONLY Blood Culture results may not be optimal due to an inadequate volume of blood received in culture bottles Performed at [facility], [address], [place], [place] [address]    Culture    Final    NO GROWTH 3 DAYS Performed at [facility], [address] [place], [place] [address]    Report Status PENDING  Incomplete  Respiratory (~20 pathogens) panel by PCR     Status: Abnormal   Collection Time: 03/05/24  8:57 AM   Specimen: Nasopharyngeal Swab; Respiratory  Result Value Ref Range Status   Adenovirus NOT DETECTED NOT DETECTED Final   Coronavirus 229E NOT DETECTED NOT DETECTED Final    Comment: (NOTE) The Coronavirus on the Respiratory Panel, DOES NOT test for the novel  Coronavirus (2019 nCoV)    Coronavirus HKU1 NOT DETECTED NOT DETECTED Final   Coronavirus NL63 NOT DETECTED NOT DETECTED Final   Coronavirus OC43 NOT DETECTED NOT DETECTED Final   Metapneumovirus NOT DETECTED NOT DETECTED Final   Rhinovirus / Enterovirus DETECTED (A) NOT DETECTED Final   Influenza A NOT DETECTED NOT DETECTED Final   Influenza B NOT DETECTED NOT DETECTED Final   Parainfluenza Virus 1 NOT DETECTED NOT DETECTED Final   Parainfluenza Virus 2 NOT DETECTED  NOT DETECTED Final   Parainfluenza Virus 3 NOT DETECTED NOT DETECTED Final   Parainfluenza Virus 4 NOT DETECTED NOT DETECTED Final   Respiratory Syncytial Virus NOT DETECTED NOT DETECTED Final   Bordetella pertussis NOT DETECTED NOT DETECTED Final   Bordetella Parapertussis NOT DETECTED NOT DETECTED Final   Chlamydophila pneumoniae NOT DETECTED NOT DETECTED Final   Mycoplasma pneumoniae NOT DETECTED NOT DETECTED Final    Comment: Performed at [facility], [address] [place], [place] [address]     Labs: BNP (last 3 results) No results for input(s): BNP in the last 8760 hours. Basic Metabolic Panel: Recent Labs  Lab 03/03/24 0753 03/04/24 1141 03/04/24 1804 03/05/24 0434 03/06/24 0440 03/07/24 0834  NA 142 142  --  133* 138 143  K 4.1 3.5  --  4.6 4.3 4.1  CL 102 103  --  102 99 105  CO2 26 24  --  19* 25 26  GLUCOSE 86 90  --  220* 98 66*  BUN 19 19  --  23 25* 29*  CREATININE 1.22 2.12* 2.09*  1.93* 1.64* 1.33*  CALCIUM  9.7 8.7*  --  7.1* 8.1* 9.1  MG  --   --   --   --  1.9  --   PHOS  --   --   --   --  5.8* 1.9*   Liver Function Tests: Recent Labs  Lab 03/03/24 0753 03/04/24 1141 03/07/24 0834  AST 43* 37  --   ALT 73* 53*  --   ALKPHOS 73 65  --   BILITOT 0.5 0.7  --   PROT 7.6 6.1*  --   ALBUMIN  4.4 3.6 3.5   No results for input(s): LIPASE, AMYLASE in the last 168 hours. No results for input(s): AMMONIA in the last 168 hours. CBC: Recent Labs  Lab 03/03/24 0753 03/04/24 1141 03/04/24 1804 03/05/24 0434 03/06/24 0440 03/07/24 0834  WBC 10.3 17.9* 25.8* 20.0* 18.5* 15.0*  NEUTROABS 7.0 14.1*  --   --   --   --   HGB 14.9 15.1 14.0 13.1 10.9* 12.8*  HCT 45.7 47.9 42.5 41.0 33.3* 40.3  MCV 91.2 94.7 92.0 95.1 93.0 95.5  PLT 206 269 262 201 155 175   Cardiac Enzymes: No results for input(s): CKTOTAL, CKMB, CKMBINDEX, TROPONINI in the last 168 hours. BNP: Invalid input(s): POCBNP CBG: Recent Labs  Lab 03/05/24 0921 03/05/24 1204 03/05/24 1522  GLUCAP 155* 88 82   D-Dimer No results for input(s): DDIMER in the last 72 hours. Hgb A1c No results for input(s): HGBA1C in the last 72 hours. Lipid Profile No results for input(s): CHOL, HDL, LDLCALC, TRIG, CHOLHDL, LDLDIRECT in the last 72 hours. Thyroid  function studies No results for input(s): TSH, T4TOTAL, T3FREE, THYROIDAB in the last 72 hours.  Invalid input(s): FREET3 Anemia work up No results for input(s): VITAMINB12, FOLATE, FERRITIN, TIBC, IRON, RETICCTPCT in the last 72 hours. Urinalysis No results found for: COLORURINE, APPEARANCEUR, LABSPEC, PHURINE, GLUCOSEU, HGBUR, BILIRUBINUR, KETONESUR, PROTEINUR, UROBILINOGEN, NITRITE, LEUKOCYTESUR Sepsis Labs Recent Labs  Lab 03/04/24 1804 03/05/24 0434 03/06/24 0440 03/07/24 0834  WBC 25.8* 20.0* 18.5* 15.0*   Microbiology Recent Results (from the past 240 hours)   Blood Culture (routine x 2)     Status: None (Preliminary result)   Collection Time: 03/04/24 11:33 AM   Specimen: BLOOD  Result Value Ref Range Status   Specimen Description   Final    BLOOD RIGHT ANTECUBITAL Performed at [facility], [address] [place], [place] [address]    Special Requests   Final    BOTTLES DRAWN AEROBIC AND ANAEROBIC Blood Culture adequate volume Performed at [facility], [address], [place], [place] [address]    Culture   Final    NO GROWTH 3 DAYS Performed at [facility], [address], [place], [place] [address]    Report Status PENDING  Incomplete  Resp panel by RT-PCR (RSV, Flu A&B, Covid) Anterior Nasal Swab     Status: None   Collection Time: 03/04/24 12:18 PM   Specimen: Anterior Nasal Swab  Result Value Ref Range Status   SARS Coronavirus 2 by RT PCR NEGATIVE NEGATIVE Final    Comment: (NOTE) SARS-CoV-2 target nucleic acids are NOT DETECTED.  The SARS-CoV-2 RNA is generally detectable in upper respiratory specimens during the acute phase of infection. The lowest concentration of SARS-CoV-2 viral copies this assay can detect is 138 copies/mL. A negative result does not preclude SARS-Cov-2 infection and should not be used as the sole basis for treatment or other patient management decisions. A negative result may occur with  improper specimen collection/handling, submission of specimen other than nasopharyngeal swab, presence of viral mutation(s) within the areas targeted by this assay, and inadequate number of viral copies(<138 copies/mL). A negative result must be combined with clinical observations, patient history, and epidemiological information. The expected result is Negative.  Fact Sheet for Patients:  bloggercourse.com  Fact Sheet for Healthcare Providers:  seriousbroker.it  This test is no t yet approved or cleared by the [place]  [place] FDA and  has been authorized for detection and/or diagnosis of SARS-CoV-2 by FDA under an Emergency Use Authorization (EUA). This EUA will remain  in effect (meaning this test can be used) for the duration of the COVID-19 declaration under Section 564(b)(1) of the Act, 21 U.S.C.section 360bbb-3(b)(1), unless the authorization is terminated  or revoked sooner.       Influenza A by PCR NEGATIVE NEGATIVE Final   Influenza B by PCR NEGATIVE NEGATIVE Final    Comment: (NOTE) The Xpert Xpress SARS-CoV-2/FLU/RSV plus assay is intended as an aid in the diagnosis of influenza from Nasopharyngeal swab specimens and should not be used as a sole basis for treatment. Nasal washings and aspirates are unacceptable for Xpert Xpress SARS-CoV-2/FLU/RSV testing.  Fact Sheet for Patients: bloggercourse.com  Fact Sheet for Healthcare Providers: seriousbroker.it  This test is not yet approved or cleared by the [place]  FDA and has been authorized for detection and/or diagnosis of SARS-CoV-2 by FDA under an Emergency Use Authorization (EUA). This EUA will remain in effect (meaning this test can be used) for the duration of the COVID-19 declaration under Section 564(b)(1) of the Act, 21 U.S.C. section 360bbb-3(b)(1), unless the authorization is terminated or revoked.     Resp Syncytial Virus by PCR NEGATIVE NEGATIVE Final    Comment: (NOTE) Fact Sheet for Patients: bloggercourse.com  Fact Sheet for Healthcare Providers: seriousbroker.it  This test is not yet approved or cleared by the [place]  FDA and has been authorized for detection and/or diagnosis of SARS-CoV-2 by FDA under an Emergency Use Authorization (EUA). This EUA will remain in effect (meaning this test can be used) for the duration of the COVID-19 declaration under Section 564(b)(1) of the Act, 21 U.S.C. section  360bbb-3(b)(1), unless the authorization is terminated or revoked.  Performed at [facility], [address], [place], [place] [address]   Culture,  blood (Routine X 2) w Reflex to ID Panel     Status: None (Preliminary result)   Collection Time: 03/04/24  6:05 PM   Specimen: BLOOD LEFT HAND  Result Value Ref Range Status   Specimen Description   Final    BLOOD LEFT HAND Performed at [facility], [address] [place], [place] [address]    Special Requests   Final    BOTTLES DRAWN AEROBIC ONLY Blood Culture results may not be optimal due to an inadequate volume of blood received in culture bottles Performed at [facility], [address] [place], [place] [address]    Culture   Final    NO GROWTH 3 DAYS Performed at [facility], [address], [place], [place] [address]    Report Status PENDING  Incomplete  Respiratory (~20 pathogens) panel by PCR     Status: Abnormal   Collection Time: 03/05/24  8:57 AM   Specimen: Nasopharyngeal Swab; Respiratory  Result Value Ref Range Status   Adenovirus NOT DETECTED NOT DETECTED Final   Coronavirus 229E NOT DETECTED NOT DETECTED Final    Comment: (NOTE) The Coronavirus on the Respiratory Panel, DOES NOT test for the novel  Coronavirus (2019 nCoV)    Coronavirus HKU1 NOT DETECTED NOT DETECTED Final   Coronavirus NL63 NOT DETECTED NOT DETECTED Final   Coronavirus OC43 NOT DETECTED NOT DETECTED Final   Metapneumovirus NOT DETECTED NOT DETECTED Final   Rhinovirus / Enterovirus DETECTED (A) NOT DETECTED Final   Influenza A NOT DETECTED NOT DETECTED Final   Influenza B NOT DETECTED NOT DETECTED Final   Parainfluenza Virus 1 NOT DETECTED NOT DETECTED Final   Parainfluenza Virus 2 NOT DETECTED NOT DETECTED Final   Parainfluenza Virus 3 NOT DETECTED NOT DETECTED Final   Parainfluenza Virus 4 NOT DETECTED NOT DETECTED Final   Respiratory Syncytial Virus NOT DETECTED NOT DETECTED Final    Bordetella pertussis NOT DETECTED NOT DETECTED Final   Bordetella Parapertussis NOT DETECTED NOT DETECTED Final   Chlamydophila pneumoniae NOT DETECTED NOT DETECTED Final   Mycoplasma pneumoniae NOT DETECTED NOT DETECTED Final    Comment: Performed at [facility], [address], [place], [place] [address]     Time coordinating discharge: Over 35 minutes  SIGNED:   Erle Odell Castor, MD  [facility] 03/07/2024, 10:50 AM Pager   If 7PM-7AM, please contact night-coverage [email or web address] Password [email or web address]

## 2024-03-07 NOTE — Evaluation (Signed)
 Occupational Therapy Evaluation Patient Details Name: Marc [name] MRN: [id number] DOB: July 23, 1955 Today's Date: 03/07/2024   History of Present Illness   68 y.o. male Presented with dyspnea and fatigue chills and cough was initially hypotensive. Dx of shock, PNA, acute respiratory failure. 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   Dx of shock, PNA, acute respiratory failure.     Clinical Impressions PTA patient independent and working. Admitted for above and presents near baseline at modified independent to independent level for ADLs and functional mobility.  Reviewed energy conservation techniques for increased tolerance for ADLs throughout the day, pt verbalized understanding.  No further OT needs identified at this time.  OT will sign off.      If plan is discharge home, recommend the following:   Assistance with cooking/housework     Functional Status Assessment   Patient has not had a recent decline in their functional status     Equipment Recommendations   None recommended by OT     Recommendations for Other Services         Precautions/Restrictions   Precautions Precautions: None Recall of Precautions/Restrictions: Intact Restrictions Weight Bearing Restrictions Per Provider Order: No     Mobility Bed Mobility Overal bed mobility: Independent                  Transfers Overall transfer level: Independent                        Balance Overall balance assessment: No apparent balance deficits (not formally assessed)                                         ADL either performed or assessed with clinical judgement    ADL Overall ADL's : Independent                                             Vision   Vision Assessment?: No apparent visual deficits     Perception         Praxis         Pertinent Vitals/Pain Pain Assessment Pain Assessment: No/denies pain     Extremity/Trunk Assessment Upper Extremity Assessment Upper Extremity Assessment: Overall WFL for tasks assessed   Lower Extremity Assessment Lower Extremity Assessment: Defer to PT evaluation   Cervical / Trunk Assessment Cervical / Trunk Assessment: Normal   Communication Communication Communication: No apparent difficulties   Cognition Arousal: Alert Behavior During Therapy: WFL for tasks assessed/performed Cognition: No apparent impairments                               Following commands: Intact       Cueing  General Comments   Cueing Techniques: Verbal cues  reviewed energy conservation techniques for incresed tolerance and independence during adls   Exercises     Shoulder Instructions      Home Living Family/patient expects to be discharged  to:: Private residence Living Arrangements: Spouse/significant other Available Help at Discharge: Family;Available 24 hours/day Type of Home: House Home Access: Level entry     Home Layout: Able to live on main level with bedroom/bathroom;Two level     Bathroom Shower/Tub: Producer, Television/film/video: Handicapped height Bathroom Accessibility: Yes How Accessible: Accessible via walker Home Equipment: Agricultural Consultant (2 wheels);Rollator (4 wheels);Wheelchair - manual;Shower seat;Grab bars - tub/shower          Prior Functioning/Environment Prior Level of Function : Independent/Modified Independent;Working/employed;Driving             Mobility Comments: walks without AD, denies falls in past 6 months other than tripping on a vine in the woods 1x ADLs Comments: independent    OT Problem List: Decreased activity  tolerance;Other (comment) (decreased activity tolerance)   OT Treatment/Interventions:        OT Goals(Current goals can be found in the care plan section)   Acute Rehab OT Goals Patient Stated Goal: home OT Goal Formulation: With patient   OT Frequency:       Co-evaluation              AM-PAC OT 6 Clicks Daily Activity     Outcome Measure Help from another person eating meals?: None Help from another person taking care of personal grooming?: None Help from another person toileting, which includes using toliet, bedpan, or urinal?: None Help from another person bathing (including washing, rinsing, drying)?: None Help from another person to put on and taking off regular upper body clothing?: None Help from another person to put on and taking off regular lower body clothing?: None 6 Click Score: 24   End of Session Nurse Communication: Mobility status  Activity Tolerance: Patient tolerated treatment well Patient left: in bed;with call bell/phone within reach  OT Visit Diagnosis: Other abnormalities of gait and mobility (R26.89);Muscle weakness (generalized) (M62.81)                Time: 8960-8945 OT Time Calculation (min): 15 min Charges:  OT General Charges $OT Visit: 1 Visit OT Evaluation $OT Eval Low Complexity: 1 Low  Etta NOVAK, OT Acute Rehabilitation Services Office [phone] Secure Chat Preferred    Etta GORMAN Hope 03/07/2024, 11:01 AM

## 2024-03-09 ENCOUNTER — Ambulatory Visit: Admitting: Orthopaedic Surgery

## 2024-03-09 LAB — CULTURE, BLOOD (ROUTINE X 2)
Culture: NO GROWTH
Culture: NO GROWTH
Special Requests: ADEQUATE

## 2024-03-18 ENCOUNTER — Ambulatory Visit (HOSPITAL_COMMUNITY): Admission: RE | Admit: 2024-03-18 | Discharge: 2024-03-18 | Attending: Physician Assistant

## 2024-03-18 ENCOUNTER — Ambulatory Visit: Admitting: Orthopaedic Surgery

## 2024-03-18 DIAGNOSIS — C642 Malignant neoplasm of left kidney, except renal pelvis: Secondary | ICD-10-CM | POA: Insufficient documentation

## 2024-03-23 ENCOUNTER — Ambulatory Visit: Admitting: Physician Assistant

## 2024-03-24 ENCOUNTER — Encounter: Payer: Self-pay | Admitting: Physician Assistant

## 2024-03-25 ENCOUNTER — Inpatient Hospital Stay

## 2024-03-25 ENCOUNTER — Inpatient Hospital Stay: Admitting: Internal Medicine

## 2024-03-25 VITALS — BP 131/85 | HR 87 | Temp 97.8°F | Resp 17 | Ht 69.0 in | Wt 227.0 lb

## 2024-03-25 DIAGNOSIS — E2740 Unspecified adrenocortical insufficiency: Secondary | ICD-10-CM | POA: Diagnosis not present

## 2024-03-25 DIAGNOSIS — C7800 Secondary malignant neoplasm of unspecified lung: Secondary | ICD-10-CM | POA: Diagnosis not present

## 2024-03-25 DIAGNOSIS — C649 Malignant neoplasm of unspecified kidney, except renal pelvis: Secondary | ICD-10-CM | POA: Diagnosis not present

## 2024-03-25 DIAGNOSIS — C642 Malignant neoplasm of left kidney, except renal pelvis: Secondary | ICD-10-CM

## 2024-03-25 DIAGNOSIS — J189 Pneumonia, unspecified organism: Secondary | ICD-10-CM | POA: Diagnosis not present

## 2024-03-25 DIAGNOSIS — K529 Noninfective gastroenteritis and colitis, unspecified: Secondary | ICD-10-CM | POA: Diagnosis not present

## 2024-03-25 DIAGNOSIS — Z79620 Long term (current) use of immunosuppressive biologic: Secondary | ICD-10-CM | POA: Diagnosis not present

## 2024-03-25 DIAGNOSIS — J069 Acute upper respiratory infection, unspecified: Secondary | ICD-10-CM | POA: Diagnosis not present

## 2024-03-25 DIAGNOSIS — Z5112 Encounter for antineoplastic immunotherapy: Secondary | ICD-10-CM | POA: Diagnosis present

## 2024-03-25 DIAGNOSIS — K5900 Constipation, unspecified: Secondary | ICD-10-CM | POA: Diagnosis not present

## 2024-03-25 LAB — CBC WITH DIFFERENTIAL (CANCER CENTER ONLY)
Abs Immature Granulocytes: 0.07 K/uL (ref 0.00–0.07)
Basophils Absolute: 0.1 K/uL (ref 0.0–0.1)
Basophils Relative: 1 %
Eosinophils Absolute: 0.4 K/uL (ref 0.0–0.5)
Eosinophils Relative: 5 %
HCT: 42.9 % (ref 39.0–52.0)
Hemoglobin: 14.5 g/dL (ref 13.0–17.0)
Immature Granulocytes: 1 %
Lymphocytes Relative: 22 %
Lymphs Abs: 2.1 K/uL (ref 0.7–4.0)
MCH: 30 pg (ref 26.0–34.0)
MCHC: 33.8 g/dL (ref 30.0–36.0)
MCV: 88.8 fL (ref 80.0–100.0)
Monocytes Absolute: 1 K/uL (ref 0.1–1.0)
Monocytes Relative: 10 %
Neutro Abs: 5.9 K/uL (ref 1.7–7.7)
Neutrophils Relative %: 61 %
Platelet Count: 250 K/uL (ref 150–400)
RBC: 4.83 MIL/uL (ref 4.22–5.81)
RDW: 15.6 % — ABNORMAL HIGH (ref 11.5–15.5)
WBC Count: 9.5 K/uL (ref 4.0–10.5)
nRBC: 0.2 % (ref 0.0–0.2)

## 2024-03-25 LAB — CMP (CANCER CENTER ONLY)
ALT: 152 U/L — ABNORMAL HIGH (ref 0–44)
AST: 59 U/L — ABNORMAL HIGH (ref 15–41)
Albumin: 4.2 g/dL (ref 3.5–5.0)
Alkaline Phosphatase: 92 U/L (ref 38–126)
Anion gap: 12 (ref 5–15)
BUN: 12 mg/dL (ref 8–23)
CO2: 25 mmol/L (ref 22–32)
Calcium: 9.1 mg/dL (ref 8.9–10.3)
Chloride: 104 mmol/L (ref 98–111)
Creatinine: 1.13 mg/dL (ref 0.61–1.24)
GFR, Estimated: >60 mL/min
Glucose, Bld: 104 mg/dL — ABNORMAL HIGH (ref 70–99)
Potassium: 4 mmol/L (ref 3.5–5.1)
Sodium: 141 mmol/L (ref 135–145)
Total Bilirubin: 0.4 mg/dL (ref 0.0–1.2)
Total Protein: 6.9 g/dL (ref 6.5–8.1)

## 2024-03-25 LAB — TSH: TSH: <0.1 u[IU]/mL — ABNORMAL LOW (ref 0.350–4.500)

## 2024-03-25 MED ORDER — SODIUM CHLORIDE 0.9 % IV SOLN
200.0000 mg | Freq: Once | INTRAVENOUS | Status: AC
  Administered 2024-03-25: 200 mg via INTRAVENOUS
  Filled 2024-03-25: qty 200

## 2024-03-25 MED ORDER — SODIUM CHLORIDE 0.9 % IV SOLN: Freq: Once | INTRAVENOUS | Status: AC

## 2024-03-25 NOTE — Progress Notes (Signed)
 "     [facility] [phone]   [phone]  OFFICE PROGRESS NOTE  [name], [name], [place] [address] [place] [address]  DIAGNOSIS: Stage IV clear-cell renal cell carcinoma with pulmonary involvement as well as pituitary metastasis diagnosed in 2023.    PRIOR THERAPY: 1) status post robotic assisted laparoscopic left radical nephrectomy by Dr. Devere on January 05, 2020.  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  The final pathological staging was T3aN0 grade 2 tumor.     2) Status post radiation therapy to the pulmonary nodules completed March 2023.  He received 50 Gray in 5 fractions.   3) Status post cryoablation to 2 lesions in the right kidney completed on Aug 23, 2021.   4) status post endoscopic transsphenoidal pituitary resection completed by Dr. Mable on November 08, 2021.  The final pathology showed clear-cell renal cell carcinoma.   5) SRS treatment to pituitary bed completed in October 2023.  He received 5 fractions for a total of 25 Gray.  CURRENT THERAPY: Keytruda  200 Mg IV every 3 weeks started Aug 04, 2021 status post 4038 cycles with axitinib  5 mg p.o. daily started February 09, 2022.  INTERVAL HISTORY: Marc [name] 68 y.o. male returns to the clinic today for follow-up visit. Discussed the use of AI scribe software for clinical note transcription with the patient, who gave verbal consent to proceed.  History of Present Illness Marc [name] is a 68 year old male with stage IV clear cell renal cell carcinoma with pulmonary and pituitary metastases who presents for evaluation prior to cycle 41 of immunotherapy and restaging imaging.  He is currently receiving pembrolizumab  200 mg IV every three weeks and has completed 40 cycles to date. He remains physically active and has tolerated treatment  well overall. He previously experienced intermittent diarrhea, which resolved following his recent hospitalization; he now has occasional constipation and mild abdominal discomfort, which he finds preferable to his prior symptoms.  Earlier this month, following his last treatment, he was found unresponsive at home and was hospitalized for four days with septic shock, adrenal insufficiency, and pneumonia. At admission, he had a cough and hypotension (blood pressure 77/40 mmHg). Inlyta  was held during hospitalization. The pneumonia was localized to the left lower lung, though he did not test positive for pneumonia during admission. He is currently recovering, with mild residual dyspnea. He denies current cough, chest pain, palpitations, lower extremity edema, fevers, chills, or night sweats since discharge.  Recent CT imaging of the chest, abdomen, and pelvis was performed last week for restaging. He compared the images to those from three months prior and noted no significant changes. He was discharged from the hospital by December 6th and continues to recover well at home.    MEDICAL HISTORY: Past Medical History:  Diagnosis Date   Arthritis    Cancer (HCC)    Kidney, Lung, Pituitary   Hyperlipidemia    Hypothyroidism     ALLERGIES:  has no known allergies.  MEDICATIONS:  Current Outpatient Medications  Medication Sig Dispense Refill   Ascorbic Acid  (VITAMIN C PO) Take 1 tablet by mouth every evening.     aspirin  EC 81 MG tablet Take 81 mg by mouth every evening. Swallow whole.     azithromycin  (ZITHROMAX ) 250 MG tablet Take 1 tab daily 3 each 0  cholecalciferol  (VITAMIN D3) 25 MCG (1000 UNIT) tablet Take 1,000 Units by mouth every evening.     Ferrous Sulfate  (IRON SLOW RELEASE PO) Take 1 tablet by mouth every evening.     INLYTA  5 MG tablet TAKE 1 TABLET BY MOUTH DAILY 30 tablet 3   levothyroxine  (SYNTHROID ) 88 MCG tablet Take 1 tablet (88 mcg total) by mouth daily. 90 tablet 3    Multiple Vitamins-Minerals (MULTIVITAMIN WITH MINERALS) tablet Take 1 tablet by mouth every evening.     Omega-3 Fatty Acids (FISH OIL PO) Take 1 capsule by mouth every evening.     predniSONE  (DELTASONE ) 5 MG tablet TAKE 1 AND 1/2 TABLETS(7.5 MG) BY MOUTH DAILY WITH BREAKFAST. MAY DOUBLE UP DURING SICK DAYS 150 tablet 3   prochlorperazine  (COMPAZINE ) 10 MG tablet Take 1 tablet (10 mg total) by mouth every 6 (six) hours as needed for nausea or vomiting. (Patient not taking: Reported on 03/04/2024) 30 tablet 0   rosuvastatin  (CRESTOR ) 40 MG tablet Take 1 tablet (40 mg total) by mouth daily. 90 tablet 3   Testosterone  (ANDROGEL ) 20.25 MG/1.25GM (1.62%) GEL Place 3 Pump onto the skin as directed. (Patient taking differently: Place 3 Pump onto the skin daily.) 150 g 5   vitamin E  180 MG (400 UNITS) capsule Take 400 Units by mouth every evening.     No current facility-administered medications for this visit.    SURGICAL HISTORY:  Past Surgical History:  Procedure Laterality Date   CRANIOTOMY N/A 11/08/2021   Procedure: ENDOSCOPIC ENDONASAL RESECTION OF SELLAR MASS;  Surgeon: Cheryle Debby LABOR, MD;  Location: [facility] OR;  Service: Neurosurgery;  Laterality: N/A;  RM 20   IR RADIOLOGIST EVAL & MGMT  07/14/2021   IR RADIOLOGIST EVAL & MGMT  07/27/2021   IR RADIOLOGIST EVAL & MGMT  01/30/2022   IR RADIOLOGIST EVAL & MGMT  05/08/2023   IR RADIOLOGIST EVAL & MGMT  11/13/2023   RADIOFREQUENCY ABLATION N/A 08/23/2021   Procedure: RENAL CRYO ABLATION;  Surgeon: Vanice Sharper, MD;  Location: [facility] ORS;  Service: Anesthesiology;  Laterality: N/A;   right shoulder rotator cuff repair Right    ROBOT ASSISTED LAPAROSCOPIC NEPHRECTOMY Left 01/05/2020   Procedure: XI ROBOTIC ASSISTED LAPAROSCOPIC RADICAL NEPHRECTOMY;  Surgeon: Devere Lonni Righter, MD;  Location: [facility] ORS;  Service: Urology;  Laterality: Left;   TOTAL HIP ARTHROPLASTY Left 03/31/2020   Procedure: LEFT TOTAL HIP ARTHROPLASTY ANTERIOR APPROACH;  Surgeon:  Vernetta Lonni GRADE, MD;  Location: [facility] OR;  Service: Orthopedics;  Laterality: Left;   TRANSPHENOIDAL APPROACH EXPOSURE N/A 11/08/2021   Procedure: TRANSPHENOIDAL APPROACH EXPOSURE;  Surgeon: Mable Lenis, MD;  Location: [facility] OR;  Service: ENT;  Laterality: N/A;    REVIEW OF SYSTEMS:  Constitutional: negative Eyes: negative Ears, nose, mouth, throat, and face: negative Respiratory: positive for cough Cardiovascular: negative Gastrointestinal: negative Genitourinary:negative Integument/breast: negative Hematologic/lymphatic: negative Musculoskeletal:negative Neurological: negative Behavioral/Psych: negative Endocrine: negative Allergic/Immunologic: negative   PHYSICAL EXAMINATION: General appearance: alert, cooperative, and no distress Head: Normocephalic, without obvious abnormality, atraumatic Neck: no adenopathy, no JVD, supple, symmetrical, trachea midline, and thyroid  not enlarged, symmetric, no tenderness/mass/nodules Lymph nodes: Cervical, supraclavicular, and axillary nodes normal. Resp: clear to auscultation bilaterally Back: symmetric, no curvature. ROM normal. No CVA tenderness. Cardio: regular rate and rhythm, S1, S2 normal, no murmur, click, rub or gallop GI: soft, non-tender; bowel sounds normal; no masses,  no organomegaly Extremities: extremities normal, atraumatic, no cyanosis or edema Neurologic: Alert and oriented X 3, normal strength and tone. Normal symmetric  reflexes. Normal coordination and gait  ECOG PERFORMANCE STATUS: 1 - Symptomatic but completely ambulatory  Blood pressure 131/85, pulse 87, temperature 97.8 F (36.6 C), temperature source Temporal, resp. rate 17, height 5' 9 (1.753 m), weight 227 lb (103 kg), SpO2 98%.  LABORATORY DATA: Lab Results  Component Value Date   WBC 9.5 03/25/2024   HGB 14.5 03/25/2024   HCT 42.9 03/25/2024   MCV 88.8 03/25/2024   PLT 250 03/25/2024      Chemistry      Component Value Date/Time   NA 143  03/07/2024 0834   NA 140 08/09/2022 1014   K 4.1 03/07/2024 0834   CL 105 03/07/2024 0834   CO2 26 03/07/2024 0834   BUN 29 (H) 03/07/2024 0834   BUN 18 08/09/2022 1014   CREATININE 1.33 (H) 03/07/2024 0834   CREATININE 1.22 03/03/2024 0753      Component Value Date/Time   CALCIUM  9.1 03/07/2024 0834   ALKPHOS 65 03/04/2024 1141   AST 37 03/04/2024 1141   AST 43 (H) 03/03/2024 0753   ALT 53 (H) 03/04/2024 1141   ALT 73 (H) 03/03/2024 0753   BILITOT 0.7 03/04/2024 1141   BILITOT 0.5 03/03/2024 0753       RADIOGRAPHIC STUDIES: DG CHEST PORT 1 VIEW Result Date: 03/04/2024 EXAM: 1 VIEW(S) XRAY OF THE CHEST 03/04/2024 05:46:00 PM COMPARISON: 03/04/2024 CLINICAL HISTORY: Central venous catheter in place. FINDINGS: LINES, TUBES AND DEVICES: Left internal jugular central venous catheter in place with tip overlying the right atrium. LUNGS AND PLEURA: Low lung volumes. Bilateral lower lung bandlike atelectasis/scarring. No pleural effusion. No pneumothorax. Left costophrenic angle excluded from the image. HEART AND MEDIASTINUM: No acute abnormality of the cardiac and mediastinal silhouettes. BONES AND SOFT TISSUES: No acute osseous abnormality. IMPRESSION: 1. Left internal jugular central venous catheter with tip overlying the right atrium. 2. Low lung volumes with bilateral lower lung bandlike atelectasis/scarring. Electronically signed by: Norman Gatlin MD 03/04/2024 07:00 PM EST RP Workstation: [id number]   DG Chest Port 1 View Result Date: 03/04/2024 CLINICAL DATA:  Sepsis. EXAM: PORTABLE CHEST 1 VIEW COMPARISON:  03/16/2021 FINDINGS: The heart size and mediastinal contours are within normal limits. Very low lung volumes with bibasilar atelectasis. Difficult to exclude component of pneumonia, especially at the right lung base. No pulmonary edema or pleural fluid identified. No pneumothorax. The visualized skeletal structures are unremarkable. IMPRESSION: Very low lung volumes with bibasilar  atelectasis. Difficult to exclude component of pneumonia, especially at the right lung base. Electronically Signed   By: Marcey Moan M.D.   On: 03/04/2024 13:10     ASSESSMENT AND PLAN: This is a very pleasant 68 years old white male diagnosed with stage IV clear-cell renal cell carcinoma with pulmonary involvement as well as pituitary metastasis in 2023.  The patient was initially diagnosed as stage III and October 2021 status post robotic assisted laparoscopic left radical nephrectomy followed by radiation therapy to the pulmonary nodules in March 2023 followed by cryoablation to 2 lesions in the right kidney completed in May 2023.  He is also status post endoscopic transsphenoidal pituitary resection on November 08, 2021.  He is also status post SRS to the pituitary bed in October 2023. The patient is currently undergoing treatment with Keytruda  200 Mg IV every 3 weeks status post 40 cycles.  This was started on Aug 04, 2021.  He is also on axitinib  5 mg p.o. daily since November 2023.   He has been tolerating his treatment fairly  well except for the recent admission to the hospital with pneumonia. He had repeat CT scan of the chest, abdomen pelvis performed recently.  At bedside independently reviewed the scan images and discussed the results with the patient today.  The final report is still pending.  I did not see any clear evidence for progression but I will wait for the final report for confirmation. Assessment and Plan Assessment & Plan Stage IV clear cell renal cell carcinoma with pulmonary and pituitary metastases Metastatic clear cell renal cell carcinoma diagnosed in 2023 with pulmonary and pituitary metastases. He remains physically active. Awaiting final radiology report for confirmation. - Reviewed recent CT imaging for restaging. - Await final radiology report for official interpretation.  Antineoplastic immunotherapy management Receiving ongoing pembrolizumab  200 mg IV every three  weeks (cycle 41). Recent hospitalization for septic shock, pneumonia, and adrenal insufficiency, likely immune-related. Inlyta  was held during hospitalization. No ongoing diarrhea; now has occasional constipation, which is preferred to prior diarrhea. Currently recovering and clinically stable. - Scheduled follow-up in three weeks.  Pneumonia, recovering Hospitalized for pneumonia with acute deterioration and septic shock. Currently recovering with mild residual dyspnea and constipation, but overall improving. Recent imaging demonstrates resolution of pneumonia, particularly in the left lower lobe. - Reviewed recent CT and chest radiograph demonstrating resolution of pneumonia. - Provided anticipatory guidance to monitor for new symptoms and seek prompt evaluation if symptoms recur.  Adrenal insufficiency Developed adrenal insufficiency during recent hospitalization, likely immune-related. Currently recovering and hemodynamically stable. The patient was advised to call immediately if he has any concerning symptoms in the interval.  The patient voices understanding of current disease status and treatment options and is in agreement with the current care plan.  All questions were answered. The patient knows to call the clinic with any problems, questions or concerns. We can certainly see the patient much sooner if necessary.  The total time spent in the appointment was 30 minutes including review of chart and various tests results, discussions about plan of care and coordination of care plan .   Disclaimer: This note was dictated with voice recognition software. Similar sounding words can inadvertently be transcribed and may not be corrected upon review.        "

## 2024-03-25 NOTE — Patient Instructions (Signed)
 [facility] - A DEPT OF [facility]  Discharge Instructions: Thank you for choosing [facility] to provide your oncology and hematology care.   If you have a lab appointment with the Cancer Center, please go directly to the Cancer Center and check in at the registration area.   Wear comfortable clothing and clothing appropriate for easy access to any Portacath or PICC line.   We strive to give you quality time with your provider. You may need to reschedule your appointment if you arrive late (15 or more minutes).  Arriving late affects you and other patients whose appointments are after yours.  Also, if you miss three or more appointments without notifying the office, you may be dismissed from the clinic at the provider's discretion.      For prescription refill requests, have your pharmacy contact our office and allow 72 hours for refills to be completed.    Today you received the following chemotherapy and/or immunotherapy agents: pembrolizumab [facility])      To help prevent nausea and vomiting after your treatment, we encourage you to take your nausea medication as directed.  BELOW ARE SYMPTOMS THAT SHOULD BE REPORTED IMMEDIATELY: *FEVER GREATER THAN 100.4 F (38 C) OR HIGHER *CHILLS OR SWEATING *NAUSEA AND VOMITING THAT IS NOT CONTROLLED WITH YOUR NAUSEA MEDICATION *UNUSUAL SHORTNESS OF BREATH *UNUSUAL BRUISING OR BLEEDING *URINARY PROBLEMS (pain or burning when urinating, or frequent urination) *BOWEL PROBLEMS (unusual diarrhea, constipation, pain near the anus) TENDERNESS IN MOUTH AND THROAT WITH OR WITHOUT PRESENCE OF ULCERS (sore throat, sores in mouth, or a toothache) UNUSUAL RASH, SWELLING OR PAIN  UNUSUAL VAGINAL DISCHARGE OR ITCHING   Items with * indicate a potential emergency and should be followed up as soon as possible or go to the Emergency Department if any problems should occur.  Please show the CHEMOTHERAPY ALERT CARD or  IMMUNOTHERAPY ALERT CARD at check-in to the Emergency Department and triage nurse.  Should you have questions after your visit or need to cancel or reschedule your appointment, please contact [facility] - A DEPT OF [facility]  Dept: [phone]  and follow the prompts.  Office hours are 8:00 a.m. to 4:30 p.m. Monday - Friday. Please note that voicemails left after 4:00 p.m. may not be returned until the following business day.  We are closed weekends and major holidays. You have access to a nurse at all times for urgent questions. Please call the main number to the clinic Dept: [phone] and follow the prompts.   For any non-urgent questions, you may also contact your provider using MyChart. We now offer e-Visits for anyone 31 and older to request care online for non-urgent symptoms. For details visit [email or web address].   Also download the MyChart app! Go to the app store, search "MyChart", open the app, select [facility], and log in with your MyChart username and password.

## 2024-03-26 LAB — T4: T4, Total: 6.8 ug/dL (ref 4.5–12.0)

## 2024-03-30 ENCOUNTER — Ambulatory Visit: Admitting: Physician Assistant

## 2024-03-30 ENCOUNTER — Other Ambulatory Visit (INDEPENDENT_AMBULATORY_CARE_PROVIDER_SITE_OTHER): Payer: Self-pay

## 2024-03-30 ENCOUNTER — Encounter: Payer: Self-pay | Admitting: Physician Assistant

## 2024-03-30 ENCOUNTER — Other Ambulatory Visit: Payer: Self-pay | Admitting: Radiology

## 2024-03-30 DIAGNOSIS — M25511 Pain in right shoulder: Secondary | ICD-10-CM

## 2024-03-30 NOTE — Progress Notes (Signed)
 "  Office Visit Note   Patient: Marc [name]           Date of Birth: 06-09-1955           MRN: [id number] Visit Date: 03/30/2024              Requested by: [name], [name], PA [address] [place],  [place] [address] PCP: Alvera Reagin, PA   Assessment & Plan: Visit Diagnoses:  1. Acute pain of right shoulder     Plan:  He has shown cognitive wall crawls and forward flexion exercises for months.  Will also send him to formal physical therapy for his right shoulder to work on range of motion, strengthening home exercise program and include modalities.  He does have immunotherapy on Tuesdays and will not be able to participate therapy in therapy on that day.  Will see him back in about 6 weeks see how he is doing overall sooner if there is any questions concerns.  May consider cortisone injection subacromial right shoulder his pain and decreased range of motion persist.  Follow-Up Instructions: Return in about 6 weeks (around 05/11/2024).   Orders:  Orders Placed This Encounter  Procedures   XR Shoulder Right   No orders of the defined types were placed in this encounter.     Procedures: No procedures performed   Clinical Data: No additional findings.   Subjective: Chief Complaint  Patient presents with   Right Shoulder - Pain    HPI Marc [name] 68 year old male well-known to our department service with a history of left total hip arthroplasty.  Comes in today with right shoulder pain.  He states that he has a right shoulder surgery for rotator cuff repair that was performed arthroscopically some 15 years ago.  He is doing well until about 3 months ago whenever he fell over a grate on injured right shoulder.  He has had decreased range of motion and tightness in the arm since then.  No numbness tingling down the arm.  Recently hospitalized in early December for severe septic shock secondary to community-acquired pneumonia.  He is on no antibiotics at this point  in time.  Patient also has stage IV renal cell carcinoma.  Review of Systems Denies any fevers or chills.  Objective: Vital Signs: There were no vitals taken for this visit.  Physical Exam Constitutional:      Appearance: He is not ill-appearing or diaphoretic.  Pulmonary:     Effort: Pulmonary effort is normal.  Neurological:     Mental Status: He is alert and oriented to person, place, and time.  Psychiatric:        Mood and Affect: Mood normal.     Ortho Exam Bilateral shoulders: 5 out of 5 strength with external/internal rotation against resistance liftoff test is negative bilaterally.  Positive impingement on the right.  Indican test is positive on the right negative on the left.  Full overhead motion of the left shoulder.  Right shoulder forward flexion active 140 passively Amreen 160 degrees. Specialty Comments:  No specialty comments available.  Imaging: XR Shoulder Right Result Date: 03/30/2024 Right shoulder 3 views: Shoulder is well located.  Glenohumeral joint is well-maintained.  Calcific changes about the rotator cuff.  No acute fractures.  There is some slight irregularity to the cortices of the proximal humeral shaft felt most likely represents the insertion of the deltoid.  No other bony abnormalities.  No acute fracture.    PMFS History:  Patient Active Problem List   Diagnosis Date Noted   Left lower lobe pneumonia 03/06/2024   Hyperglycemia 03/06/2024   Acute respiratory failure with hypoxia (HCC) 03/06/2024   AKI (acute kidney injury) 03/06/2024   Hypothyroidism 03/06/2024   Hyperlipidemia 03/06/2024   Septic shock (HCC) 03/04/2024   Encounter for antineoplastic immunotherapy 05/01/2022   Status post transsphenoidal pituitary resection 11/08/2021   Metastasis to Pituitary (HCC) 11/08/2021   Secondary adrenal insufficiency 06/19/2021   Low testosterone  in male 06/19/2021   Pituitary macroadenoma (HCC) 06/19/2021   Coronary artery disease 06/13/2021    Gallstones 06/13/2021   Hardening of the aorta (main artery of the heart) 06/13/2021   History of hypercholesterolemia 06/13/2021   Low testosterone  06/13/2021   Osteoarthritis 06/13/2021   Other specified nonpsychotic mental disorders 06/13/2021   History of colonic polyps 06/13/2021   Personal history of other malignant neoplasm of kidney 06/13/2021   Pure hypercholesterolemia 06/13/2021   Tobacco use 06/13/2021   Secondary renal cell carcinoma of lung, right (HCC) 06/13/2021   Renal cell carcinoma of right kidney (HCC) 06/13/2021   Kidney cancer, primary, with metastasis from kidney to other site [facility]) 06/01/2021   Unilateral primary osteoarthritis, left knee 06/09/2020   Unilateral primary osteoarthritis, right knee 06/09/2020   Status post total replacement of left hip 03/31/2020   Unilateral primary osteoarthritis, left hip 01/28/2020   Renal mass 01/05/2020   Past Medical History:  Diagnosis Date   Arthritis    Cancer (HCC)    Kidney, Lung, Pituitary   Hyperlipidemia    Hypothyroidism     History reviewed. No pertinent family history.  Past Surgical History:  Procedure Laterality Date   CRANIOTOMY N/A 11/08/2021   Procedure: ENDOSCOPIC ENDONASAL RESECTION OF SELLAR MASS;  Surgeon: Cheryle Debby LABOR, MD;  Location: [facility] OR;  Service: Neurosurgery;  Laterality: N/A;  RM 20   IR RADIOLOGIST EVAL & MGMT  07/14/2021   IR RADIOLOGIST EVAL & MGMT  07/27/2021   IR RADIOLOGIST EVAL & MGMT  01/30/2022   IR RADIOLOGIST EVAL & MGMT  05/08/2023   IR RADIOLOGIST EVAL & MGMT  11/13/2023   RADIOFREQUENCY ABLATION N/A 08/23/2021   Procedure: RENAL CRYO ABLATION;  Surgeon: Vanice Sharper, MD;  Location: [facility] ORS;  Service: Anesthesiology;  Laterality: N/A;   right shoulder rotator cuff repair Right    ROBOT ASSISTED LAPAROSCOPIC NEPHRECTOMY Left 01/05/2020   Procedure: XI ROBOTIC ASSISTED LAPAROSCOPIC RADICAL NEPHRECTOMY;  Surgeon: Devere Lonni Righter, MD;  Location: [facility] ORS;  Service:  Urology;  Laterality: Left;   TOTAL HIP ARTHROPLASTY Left 03/31/2020   Procedure: LEFT TOTAL HIP ARTHROPLASTY ANTERIOR APPROACH;  Surgeon: Vernetta Lonni GRADE, MD;  Location: [facility] OR;  Service: Orthopedics;  Laterality: Left;   TRANSPHENOIDAL APPROACH EXPOSURE N/A 11/08/2021   Procedure: TRANSPHENOIDAL APPROACH EXPOSURE;  Surgeon: Mable Lenis, MD;  Location: [facility] OR;  Service: ENT;  Laterality: N/A;   Social History   Occupational History   Not on file  Tobacco Use   Smoking status: Former    Current packs/day: 0.00    Types: Cigarettes    Start date: 04/02/1985    Quit date: 04/03/2015    Years since quitting: 8.9   Smokeless tobacco: Never  Vaping Use   Vaping status: Former   Quit date: 04/16/2021   Substances: Nicotine, Flavoring  Substance and Sexual Activity   Alcohol use: Yes    Comment: occas    Drug use: No   Sexual activity: Not on file        "

## 2024-03-31 ENCOUNTER — Other Ambulatory Visit: Payer: Self-pay | Admitting: Physician Assistant

## 2024-03-31 DIAGNOSIS — C642 Malignant neoplasm of left kidney, except renal pelvis: Secondary | ICD-10-CM

## 2024-04-07 NOTE — Therapy (Signed)
 " OUTPATIENT PHYSICAL THERAPY UPPER EXTREMITY EVALUATION   Patient Name: Marc [name] MRN: [id number] DOB:02-17-1956, 69 y.o., male Today's Date: 04/08/2024  END OF SESSION:  PT End of Session - 04/08/24 0929     Visit Number 1    Number of Visits 16    Date for Recertification  06/03/24    Authorization Type MEDICARE    Progress Note Due on Visit 10    PT Start Time 0940    PT Stop Time 1014    PT Time Calculation (min) 34 min    Activity Tolerance Patient tolerated treatment well    Behavior During Therapy [facility] for tasks assessed/performed          Past Medical History:  Diagnosis Date   Arthritis    Cancer (HCC)    Kidney, Lung, Pituitary   Hyperlipidemia    Hypothyroidism    Past Surgical History:  Procedure Laterality Date   CRANIOTOMY N/A 11/08/2021   Procedure: ENDOSCOPIC ENDONASAL RESECTION OF SELLAR MASS;  Surgeon: Cheryle Debby LABOR, MD;  Location: [facility] OR;  Service: Neurosurgery;  Laterality: N/A;  RM 20   IR RADIOLOGIST EVAL & MGMT  07/14/2021   IR RADIOLOGIST EVAL & MGMT  07/27/2021   IR RADIOLOGIST EVAL & MGMT  01/30/2022   IR RADIOLOGIST EVAL & MGMT  05/08/2023   IR RADIOLOGIST EVAL & MGMT  11/13/2023   RADIOFREQUENCY ABLATION N/A 08/23/2021   Procedure: RENAL CRYO ABLATION;  Surgeon: Vanice Sharper, MD;  Location: [facility];  Service: Anesthesiology;  Laterality: N/A;   right shoulder rotator cuff repair Right    ROBOT ASSISTED LAPAROSCOPIC NEPHRECTOMY Left 01/05/2020   Procedure: XI ROBOTIC ASSISTED LAPAROSCOPIC RADICAL NEPHRECTOMY;  Surgeon: Devere Lonni Righter, MD;  Location: [facility];  Service: Urology;  Laterality: Left;   TOTAL HIP ARTHROPLASTY Left 03/31/2020   Procedure: LEFT TOTAL HIP ARTHROPLASTY ANTERIOR APPROACH;  Surgeon: Vernetta Lonni GRADE, MD;  Location: [facility] OR;  Service: Orthopedics;  Laterality: Left;   TRANSPHENOIDAL APPROACH EXPOSURE N/A 11/08/2021   Procedure: TRANSPHENOIDAL APPROACH EXPOSURE;  Surgeon: Mable Lenis, MD;  Location[facility]  OR;  Service: ENT;  Laterality: N/A;   Patient Active Problem List   Diagnosis Date Noted   Left lower lobe pneumonia 03/06/2024   Hyperglycemia 03/06/2024   Acute respiratory failure with hypoxia (HCC) 03/06/2024   AKI (acute kidney injury) 03/06/2024   Hypothyroidism 03/06/2024   Hyperlipidemia 03/06/2024   Septic shock (HCC) 03/04/2024   Encounter for antineoplastic immunotherapy 05/01/2022   Status post transsphenoidal pituitary resection 11/08/2021   Metastasis to Pituitary (HCC) 11/08/2021   Secondary adrenal insufficiency 06/19/2021   Low testosterone  in male 06/19/2021   Pituitary macroadenoma (HCC) 06/19/2021   Coronary artery disease 06/13/2021   Gallstones 06/13/2021   Hardening of the aorta (main artery of the heart) 06/13/2021   History of hypercholesterolemia 06/13/2021   Low testosterone  06/13/2021   Osteoarthritis 06/13/2021   Other specified nonpsychotic mental disorders 06/13/2021   History of colonic polyps 06/13/2021   Personal history of other malignant neoplasm of kidney 06/13/2021   Pure hypercholesterolemia 06/13/2021   Tobacco use 06/13/2021   Secondary renal cell carcinoma of lung, right (HCC) 06/13/2021   Renal cell carcinoma of right kidney (HCC) 06/13/2021   Kidney cancer, primary, with metastasis from kidney to other site [facility]) 06/01/2021   Unilateral primary osteoarthritis, left knee 06/09/2020   Unilateral primary osteoarthritis, right knee 06/09/2020   Status post total replacement of left hip 03/31/2020   Unilateral primary osteoarthritis,  left hip 01/28/2020   Renal mass 01/05/2020    PCP: Sydelle Close, PA  REFERRING PROVIDER: Bertrum Gaskins, PA-C  REFERRING DIAG: M25.511 (ICD-10-CM) - Acute pain of right shoulder  THERAPY DIAG:  Chronic right shoulder pain  Stiffness of right shoulder, not elsewhere classified  Muscle weakness (generalized)  Abnormal posture  Rationale for Evaluation and Treatment: Rehabilitation  ONSET DATE:  chronic, though had a fall 4-5 months ago that triggered mobility issues   SUBJECTIVE:                                                                                                                                                                                      SUBJECTIVE STATEMENT: If I pick up that drink, I can feel it.  Hand dominance: Right  PERTINENT HISTORY: Patient reports that he tore his rotator cuff 10-15 years ago and underwent a repair that has led to some chronic pain and mobility deficits (not severe). Patient does report that he had a fall 4-5 months ago where he tripped over a grapevine while in the woods and landed on the Rt shoulder. Patient endorses no other injuries, surgeries, or trauma to the area or surrounding joints. Patient is currently in active cancer treatment several times per week and is on current prescription of daily prednisone  (7mg ).  See PMH or personal factors for in depth comorbidities   PAIN:  Are you having pain? Yes: NPRS scale: 1-2/10 max  Pain location: general Rt shoulder  Pain description: dull  Aggravating factors:   Relieving factors: stretches   PRECAUTIONS: Fall  RED FLAGS: Bowel or bladder incontinence: No and Cauda equina syndrome: No   WEIGHT BEARING RESTRICTIONS: No  FALLS:  Has patient fallen in last 6 months? Yes. 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  DIAGNOSTIC FINDINGS:  Right shoulder 3 views: Shoulder is well located.  Glenohumeral joint is  well-maintained.  Calcific changes about the rotator cuff.  No acute  fractures.  There is some slight irregularity to the cortices of the  proximal humeral  shaft felt most likely represents the insertion of the  deltoid.  No other bony abnormalities.  No acute fracture.   PATIENT SURVEYS :  PSFS: THE PATIENT SPECIFIC FUNCTIONAL SCALE  Place score of 0-10 (0 = unable to perform activity and 10 = able to perform activity at the same level as before injury or problem)  Activity Date: 04/08/2024    Lifting Rt arm  3    2. Lifting over 2 pounds  3    3. Washing hair without needing assistance  5    4.      Total Score 3.67      Total Score = Sum of activity scores/number of activities  Minimally Detectable Change: 3 points (for single activity); 2 points (for average score)  [facility] (nd). The Patient Specific Functional Scale . Retrieved from [email or web address].[email or web address]   COGNITION: Overall cognitive status: Within functional limits for tasks assessed     SENSATION: Not tested Patient subjectively endorses no n/t down the arm   POSTURE: Rounded shoulders, forward head, increased thoracic kyphosis   UPPER EXTREMITY ROM:   Active ROM Right Eval 04/08/2024 Left Eval 04/08/2024  Shoulder flexion (seated) 120deg WFL  Shoulder extension    Shoulder abduction (seated) 106deg  WFL   Shoulder adduction    Shoulder internal rotation (seated) PSIS T9  Shoulder external rotation (seated) Lt ear, then can crawl to occiput T4  Elbow flexion    Elbow extension    Wrist flexion    Wrist extension    Wrist ulnar deviation    Wrist radial deviation    Wrist pronation    Wrist supination    (Blank rows = not tested)  UPPER EXTREMITY MMT:  MMT Right Eval 04/08/2024 Left Eval 04/08/2024   Shoulder flexion (seated) 3+/5 5/5  Shoulder extension    Shoulder abduction (seated) 3+/5 5/5  Shoulder adduction    Shoulder internal rotation (seated) 5/5 5/5  Shoulder external rotation (seated) 3+/5 4/5  Middle trapezius    Lower trapezius    Elbow flexion    Elbow extension    Wrist flexion    Wrist extension    Wrist ulnar deviation    Wrist radial deviation    Wrist pronation    Wrist supination    Grip strength (lbs)    (Blank rows = not tested)  PALPATION:  Not overly TTP in generalized Rt shoulder area                                                                                                                              TREATMENT DATE:  04/08/2024 TherEx:  HEP handout provided with patient performing one set of each activity for appropriate form. Verbal and tactile cues provided.   Self-Care:  POC   PATIENT EDUCATION: Education details: HEP, POC Person educated: Patient Education method: Explanation, Demonstration, Tactile cues, Verbal cues, and Handouts Education comprehension: verbalized understanding, returned demonstration, verbal cues required,  and tactile cues required  HOME EXERCISE PROGRAM: Access Code: [email or web address] URL: [email or web address] Date: 04/08/2024 Prepared by: Susannah Daring  Exercises - Supine Shoulder Flexion with Dowel  - 1 x daily - 7 x weekly - 2 sets - 10 reps - 8-10sec hold - Supine Shoulder External Rotation with Dowel  - 1 x daily - 7 x weekly - 2 sets - 10 reps - 8-10sec hold - Supine Shoulder Abduction AAROM with Dowel  - 1 x daily - 7 x weekly - 2 sets - 10 reps - 8-10sec hold - Standing Shoulder Row with Anchored Resistance  - 1 x daily - 7 x weekly - 2 sets - 10 reps - 3sec hold - Seated Scapular Retraction  - 1 x daily - 7 x weekly - 2 sets - 10 reps - 3sec hold  ASSESSMENT:  CLINICAL IMPRESSION: Patient is a 69 y.o. M who was seen today for physical therapy evaluation and treatment for Rt shoulder pain  presenting with deficits in ROM, strength, motor coordination, posture, and functional mobility leading to difficulties performing ADLs. Patient is mostly limited with flexion, abduction, and ER strength and ROM. Patient will benefit from skilled PT to address above noted deficits.    OBJECTIVE IMPAIRMENTS: decreased coordination, decreased ROM, decreased strength, impaired flexibility, improper body mechanics, postural dysfunction, and pain.   ACTIVITY LIMITATIONS: lifting, bathing, reach over head, and hygiene/grooming  PARTICIPATION LIMITATIONS: cleaning, community activity, and yard work  PERSONAL FACTORS: Past/current experiences, Time since onset of injury/illness/exacerbation, and 3+ comorbidities: arthritis, cancer, HLD, hypothyroidism are also affecting patient's functional outcome.   REHAB POTENTIAL: Good  CLINICAL DECISION MAKING: Stable/uncomplicated  EVALUATION COMPLEXITY: Low  GOALS: Goals reviewed with patient? Yes  SHORT TERM GOALS: Target date: 04/29/2024  Patient will show compliance with initial HEP. Baseline: Goal status: INITIAL  2.  Patient will increase Rt shoulder abduction to at least 120deg in order to improve functional mobility.  Baseline:  Goal status: INITIAL    LONG TERM GOALS: Target date: 06/03/2024  Patient will be independent with final HEP in order to maintain and progress upon functional gains made within PT. Baseline:  Goal status: INITIAL  2.   Patient will increase PSFS to at least 5.67 in order to show a significant improvement in subjective disability rating. Baseline:  Goal status: INITIAL  3. Patient will increase Rt shoulder abduction and flexion to at least 140deg in order to improve functional mobility.  Baseline:  Goal status: INITIAL  4.  Patient will increase Rt shoulder flexion and abduction MMT to at least 4/5 in order to improve biomechanics with functional mobility.  Baseline:  Goal status: INITIAL    PLAN: PT  FREQUENCY: 1-2x/week  PT DURATION: 8 weeks  PLANNED INTERVENTIONS: [id number]- PT Re-evaluation, 97750- Physical Performance Testing, 97110-Therapeutic exercises, 97530- Therapeutic activity, [id number]- Neuromuscular re-education, 97535- Self Care, [id number]- Manual therapy, [id number]- Gait training, [phone]- Orthotic Initial, [phone]- Orthotic/Prosthetic subsequent, [phone]- Canalith repositioning, [phone]- Aquatic Therapy, [phone]- Electrical stimulation (unattended), [phone]- Electrical stimulation (manual), [id number]- Vasopneumatic device, [id number]- Ultrasound, [id number]- Traction (mechanical), [id number]- Ionotophoresis 4mg /ml Dexamethasone , [id number] (1-2 muscles), [id number] (3+ muscles)- Dry Needling, Patient/Family education, Balance training, Stair training, Taping, Joint mobilization, Joint manipulation, Spinal manipulation, Spinal mobilization, Vestibular training, DME instructions, Cryotherapy, and Moist heat  PLAN FOR NEXT SESSION: review HEP, postural strengthening, Rt shoulder ROM and strengthening    Susannah Daring, PT, DPT 04/08/2024 4:08 PM    "

## 2024-04-08 ENCOUNTER — Ambulatory Visit

## 2024-04-08 DIAGNOSIS — M6281 Muscle weakness (generalized): Secondary | ICD-10-CM | POA: Diagnosis not present

## 2024-04-08 DIAGNOSIS — M25511 Pain in right shoulder: Secondary | ICD-10-CM | POA: Diagnosis not present

## 2024-04-08 DIAGNOSIS — M25611 Stiffness of right shoulder, not elsewhere classified: Secondary | ICD-10-CM | POA: Diagnosis not present

## 2024-04-08 DIAGNOSIS — G8929 Other chronic pain: Secondary | ICD-10-CM

## 2024-04-08 DIAGNOSIS — R293 Abnormal posture: Secondary | ICD-10-CM

## 2024-04-09 ENCOUNTER — Ambulatory Visit (INDEPENDENT_AMBULATORY_CARE_PROVIDER_SITE_OTHER)

## 2024-04-09 DIAGNOSIS — R293 Abnormal posture: Secondary | ICD-10-CM | POA: Diagnosis not present

## 2024-04-09 DIAGNOSIS — M25611 Stiffness of right shoulder, not elsewhere classified: Secondary | ICD-10-CM | POA: Diagnosis not present

## 2024-04-09 DIAGNOSIS — G8929 Other chronic pain: Secondary | ICD-10-CM

## 2024-04-09 DIAGNOSIS — M6281 Muscle weakness (generalized): Secondary | ICD-10-CM

## 2024-04-09 DIAGNOSIS — M25511 Pain in right shoulder: Secondary | ICD-10-CM

## 2024-04-09 NOTE — Therapy (Signed)
 " OUTPATIENT PHYSICAL THERAPY UPPER EXTREMITY TREATMENT   Patient Name: Marc [name] MRN: [id number] DOB:Jun 18, 1955, 69 y.o., male Today's Date: 04/09/2024  END OF SESSION:  PT End of Session - 04/09/24 1102     Visit Number 2    Number of Visits 16    Date for Recertification  06/03/24    Authorization Type MEDICARE    Progress Note Due on Visit 10    PT Start Time 1103    PT Stop Time 1141    PT Time Calculation (min) 38 min    Activity Tolerance Patient tolerated treatment well    Behavior During Therapy WFL for tasks assessed/performed           Past Medical History:  Diagnosis Date   Arthritis    Cancer (HCC)    Kidney, Lung, Pituitary   Hyperlipidemia    Hypothyroidism    Past Surgical History:  Procedure Laterality Date   CRANIOTOMY N/A 11/08/2021   Procedure: ENDOSCOPIC ENDONASAL RESECTION OF SELLAR MASS;  Surgeon: Cheryle Debby LABOR, MD;  Location: [facility] OR;  Service: Neurosurgery;  Laterality: N/A;  RM 20   IR RADIOLOGIST EVAL & MGMT  07/14/2021   IR RADIOLOGIST EVAL & MGMT  07/27/2021   IR RADIOLOGIST EVAL & MGMT  01/30/2022   IR RADIOLOGIST EVAL & MGMT  05/08/2023   IR RADIOLOGIST EVAL & MGMT  11/13/2023   RADIOFREQUENCY ABLATION N/A 08/23/2021   Procedure: RENAL CRYO ABLATION;  Surgeon: Vanice Sharper, MD;  Location: [facility]S;  Service: Anesthesiology;  Laterality: N/A;   right shoulder rotator cuff repair Right    ROBOT ASSISTED LAPAROSCOPIC NEPHRECTOMY Left 01/05/2020   Procedure: XI ROBOTIC ASSISTED LAPAROSCOPIC RADICAL NEPHRECTOMY;  Surgeon: Devere Lonni Righter, MD;  Location: [facility];  Service: Urology;  Laterality: Left;   TOTAL HIP ARTHROPLASTY Left 03/31/2020   Procedure: LEFT TOTAL HIP ARTHROPLASTY ANTERIOR APPROACH;  Surgeon: Vernetta Lonni GRADE, MD;  Location: [facility] OR;  Service: Orthopedics;  Laterality: Left;   TRANSPHENOIDAL APPROACH EXPOSURE N/A 11/08/2021   Procedure: TRANSPHENOIDAL APPROACH EXPOSURE;  Surgeon: Mable Lenis, MD;  Location: [facility]  OR;  Service: ENT;  Laterality: N/A;   Patient Active Problem List   Diagnosis Date Noted   Left lower lobe pneumonia 03/06/2024   Hyperglycemia 03/06/2024   Acute respiratory failure with hypoxia (HCC) 03/06/2024   AKI (acute kidney injury) 03/06/2024   Hypothyroidism 03/06/2024   Hyperlipidemia 03/06/2024   Septic shock (HCC) 03/04/2024   Encounter for antineoplastic immunotherapy 05/01/2022   Status post transsphenoidal pituitary resection 11/08/2021   Metastasis to Pituitary (HCC) 11/08/2021   Secondary adrenal insufficiency 06/19/2021   Low testosterone  in male 06/19/2021   Pituitary macroadenoma (HCC) 06/19/2021   Coronary artery disease 06/13/2021   Gallstones 06/13/2021   Hardening of the aorta (main artery of the heart) 06/13/2021   History of hypercholesterolemia 06/13/2021   Low testosterone  06/13/2021   Osteoarthritis 06/13/2021   Other specified nonpsychotic mental disorders 06/13/2021   History of colonic polyps 06/13/2021   Personal history of other malignant neoplasm of kidney 06/13/2021   Pure hypercholesterolemia 06/13/2021   Tobacco use 06/13/2021   Secondary renal cell carcinoma of lung, right (HCC) 06/13/2021   Renal cell carcinoma of right kidney (HCC) 06/13/2021   Kidney cancer, primary, with metastasis from kidney to other site [facility]) 06/01/2021   Unilateral primary osteoarthritis, left knee 06/09/2020   Unilateral primary osteoarthritis, right knee 06/09/2020   Status post total replacement of left hip 03/31/2020   Unilateral primary  osteoarthritis, left hip 01/28/2020   Renal mass 01/05/2020    PCP: Sydelle Close, PA  REFERRING PROVIDER: Bertrum Gaskins, PA-C  REFERRING DIAG: M25.511 (ICD-10-CM) - Acute pain of right shoulder  THERAPY DIAG:  Chronic right shoulder pain  Stiffness of right shoulder, not elsewhere classified  Muscle weakness (generalized)  Abnormal posture  Rationale for Evaluation and Treatment: Rehabilitation  ONSET DATE:  chronic, though had a fall 4-5 months ago that triggered mobility issues   SUBJECTIVE:                                                                                                                                                                                      SUBJECTIVE STATEMENT: Patient reports no changes.  Hand dominance: Right  PERTINENT HISTORY: Patient reports that he tore his rotator cuff 10-15 years ago and underwent a repair that has led to some chronic pain and mobility deficits (not severe). Patient does report that he had a fall 4-5 months ago where he tripped over a grapevine while in the woods and landed on the Rt shoulder. Patient endorses no other injuries, surgeries, or trauma to the area or surrounding joints. Patient is currently in active cancer treatment several times per week and is on current prescription of daily prednisone  (7mg ).  See PMH or personal factors for in depth comorbidities   PAIN:  Are you having pain? Yes: NPRS scale: no pain, just stiffness Pain location: general Rt shoulder  Pain description: dull  Aggravating factors:   Relieving factors: stretches   PRECAUTIONS: Fall  RED FLAGS: Bowel or bladder incontinence: No and Cauda equina syndrome: No   WEIGHT BEARING RESTRICTIONS: No  FALLS:  Has patient fallen in last 6 months? Yes. 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  DIAGNOSTIC FINDINGS:  Right shoulder 3 views: Shoulder is well located.  Glenohumeral joint is  well-maintained.  Calcific changes about the rotator cuff.  No acute  fractures.  There is some slight irregularity to the cortices of the  proximal humeral shaft felt most likely  represents the insertion of the  deltoid.  No other bony abnormalities.  No acute fracture.   PATIENT SURVEYS :  PSFS: THE PATIENT SPECIFIC FUNCTIONAL SCALE  Place score of 0-10 (0 = unable to perform activity and 10 = able to perform activity at the same level as before injury or problem)  Activity Date: 04/08/2024    Lifting Rt arm  3    2. Lifting over 2 pounds  3    3. Washing hair without needing assistance  5    4.      Total Score 3.67      Total Score = Sum of activity scores/number of activities  Minimally Detectable Change: 3 points (for single activity); 2 points (for average score)  [facility] (nd). The Patient Specific Functional Scale . Retrieved from [email or web address]   COGNITION: Overall cognitive status: Within functional limits for tasks assessed     SENSATION: Not tested Patient subjectively endorses no n/t down the arm   POSTURE: Rounded shoulders, forward head, increased thoracic kyphosis   UPPER EXTREMITY ROM:   Active ROM Right Eval 04/08/2024 Left Eval 04/08/2024  Shoulder flexion (seated) 120deg WFL  Shoulder extension    Shoulder abduction (seated) 106deg  WFL   Shoulder adduction    Shoulder internal rotation (seated) PSIS T9  Shoulder external rotation (seated) Lt ear, then can crawl to occiput T4  Elbow flexion    Elbow extension    Wrist flexion    Wrist extension    Wrist ulnar deviation    Wrist radial deviation    Wrist pronation    Wrist supination    (Blank rows = not tested)  UPPER EXTREMITY MMT:  MMT Right Eval 04/08/2024 Left Eval 04/08/2024   Shoulder flexion (seated) 3+/5 5/5  Shoulder extension    Shoulder abduction (seated) 3+/5 5/5  Shoulder adduction    Shoulder internal rotation (seated) 5/5 5/5  Shoulder external rotation (seated) 3+/5 4/5  Middle trapezius    Lower trapezius    Elbow flexion    Elbow extension    Wrist flexion    Wrist extension    Wrist ulnar deviation    Wrist radial deviation    Wrist pronation    Wrist supination    Grip strength (lbs)    (Blank rows = not tested)  PALPATION:  Not overly TTP in generalized Rt shoulder area                                                                                                                              TREATMENT DATE:  04/09/2024 TherAct:  Pulleys into shoulder flexion and abduction 3 min each  Wall ladder into shoulder flexion and abduction 1x10 with 3s holds   Neuro Re-Ed:  Standing rows with blue TB 2x15  Standing straight arm pulldowns with green TB 2x20 Supine horizontal abduction 2x12 with 2s scap retraction holds   TherEx:  Supine shoulder flexion with  2# DB going through full ROM x20  Sidelying shoulder abduction with 2# DB 2x10   04/08/2024 TherEx:  HEP handout provided with patient performing one set of each activity for appropriate form. Verbal and tactile cues provided.   Self-Care:  POC   PATIENT EDUCATION: Education details: HEP, POC Person educated: Patient Education method: Explanation, Demonstration, Tactile cues, Verbal cues, and Handouts Education comprehension: verbalized understanding, returned demonstration, verbal cues required, and tactile cues required  HOME EXERCISE PROGRAM: Access Code: [email or web address] URL: [email or web address] Date: 04/08/2024 Prepared by: Susannah Daring  Exercises - Supine Shoulder Flexion with Dowel  - 1 x daily - 7 x weekly - 2 sets - 10 reps - 8-10sec hold - Supine Shoulder External Rotation with Dowel  - 1 x daily - 7 x weekly - 2 sets - 10 reps - 8-10sec hold - Supine  Shoulder Abduction AAROM with Dowel  - 1 x daily - 7 x weekly - 2 sets - 10 reps - 8-10sec hold - Standing Shoulder Row with Anchored Resistance  - 1 x daily - 7 x weekly - 2 sets - 10 reps - 3sec hold - Seated Scapular Retraction  - 1 x daily - 7 x weekly - 2 sets - 10 reps - 3sec hold  ASSESSMENT:  CLINICAL IMPRESSION: Patient arrived to session noting no change in symptoms. Patient tolerated all activities this date with no increase in pain. Patient will continue to benefit from skilled PT.    OBJECTIVE IMPAIRMENTS: decreased coordination, decreased ROM, decreased strength, impaired flexibility, improper body mechanics, postural dysfunction, and pain.   ACTIVITY LIMITATIONS: lifting, bathing, reach over head, and hygiene/grooming  PARTICIPATION LIMITATIONS: cleaning, community activity, and yard work  PERSONAL FACTORS: Past/current experiences, Time since onset of injury/illness/exacerbation, and 3+ comorbidities: arthritis, cancer, HLD, hypothyroidism are also affecting patient's functional outcome.   REHAB POTENTIAL: Good  CLINICAL DECISION MAKING: Stable/uncomplicated  EVALUATION COMPLEXITY: Low  GOALS: Goals reviewed with patient? Yes  SHORT TERM GOALS: Target date: 04/29/2024  Patient will show compliance with initial HEP. Baseline: Goal status: INITIAL  2.  Patient will increase Rt shoulder abduction to at least 120deg in order to improve functional mobility.  Baseline:  Goal status: INITIAL    LONG TERM GOALS: Target date: 06/03/2024  Patient will be independent with final HEP in order to maintain and progress upon functional gains made within PT. Baseline:  Goal status: INITIAL  2.   Patient will increase PSFS to at least 5.67 in order to show a significant improvement in subjective disability rating. Baseline:  Goal status: INITIAL  3. Patient will increase Rt shoulder abduction and flexion to at least 140deg in order to improve functional mobility.   Baseline:  Goal status: INITIAL  4.  Patient will increase Rt shoulder flexion and abduction MMT to at least 4/5 in order to improve biomechanics with functional mobility.  Baseline:  Goal status: INITIAL    PLAN: PT FREQUENCY: 1-2x/week  PT DURATION: 8 weeks  PLANNED INTERVENTIONS: [id number]- PT Re-evaluation, 97750- Physical Performance Testing, 97110-Therapeutic exercises, 97530- Therapeutic activity, [id number]- Neuromuscular re-education, 97535- Self Care, [id number]- Manual therapy, [id number]- Gait training, [phone]- Orthotic Initial, [phone]- Orthotic/Prosthetic subsequent, [phone]- Canalith repositioning, [phone]- Aquatic Therapy, [phone]- Electrical stimulation (unattended), [phone]- Electrical stimulation (manual), [id number]- Vasopneumatic device, [id number]- Ultrasound, [id number]- Traction (mechanical), [id number]- Ionotophoresis 4mg /ml Dexamethasone , [id number] (1-2 muscles), [id number] (3+ muscles)- Dry Needling, Patient/Family education, Balance training, Stair training, Taping, Joint mobilization, Joint manipulation, Spinal manipulation, Spinal mobilization, Vestibular training, DME instructions, Cryotherapy, and Moist heat  PLAN FOR NEXT SESSION: postural strengthening, Rt shoulder ROM and strengthening    Susannah Daring, PT, DPT 04/09/2024 11:46 AM    "

## 2024-04-13 ENCOUNTER — Other Ambulatory Visit: Payer: Self-pay | Admitting: Physician Assistant

## 2024-04-13 ENCOUNTER — Ambulatory Visit
Admission: RE | Admit: 2024-04-13 | Discharge: 2024-04-13 | Disposition: A | Source: Ambulatory Visit | Attending: Physician Assistant | Admitting: Physician Assistant

## 2024-04-13 DIAGNOSIS — J189 Pneumonia, unspecified organism: Secondary | ICD-10-CM

## 2024-04-14 ENCOUNTER — Ambulatory Visit: Admitting: Physical Therapy

## 2024-04-14 ENCOUNTER — Encounter: Payer: Self-pay | Admitting: Physical Therapy

## 2024-04-14 DIAGNOSIS — M6281 Muscle weakness (generalized): Secondary | ICD-10-CM | POA: Diagnosis not present

## 2024-04-14 DIAGNOSIS — M25511 Pain in right shoulder: Secondary | ICD-10-CM

## 2024-04-14 DIAGNOSIS — M25611 Stiffness of right shoulder, not elsewhere classified: Secondary | ICD-10-CM

## 2024-04-14 DIAGNOSIS — R293 Abnormal posture: Secondary | ICD-10-CM

## 2024-04-14 DIAGNOSIS — G8929 Other chronic pain: Secondary | ICD-10-CM

## 2024-04-14 NOTE — Therapy (Signed)
 " OUTPATIENT PHYSICAL THERAPY UPPER EXTREMITY TREATMENT   Patient Name: Marc [name] MRN: [id number] DOB:1955-11-02, 69 y.o., male Today's Date: 04/14/2024  END OF SESSION:  PT End of Session - 04/14/24 1057     Visit Number 3    Number of Visits 16    Date for Recertification  06/03/24    Authorization Type MEDICARE    Progress Note Due on Visit 10    PT Start Time 1058    PT Stop Time 1137    PT Time Calculation (min) 39 min    Activity Tolerance Patient tolerated treatment well    Behavior During Therapy WFL for tasks assessed/performed            Past Medical History:  Diagnosis Date   Arthritis    Cancer (HCC)    Kidney, Lung, Pituitary   Hyperlipidemia    Hypothyroidism    Past Surgical History:  Procedure Laterality Date   CRANIOTOMY N/A 11/08/2021   Procedure: ENDOSCOPIC ENDONASAL RESECTION OF SELLAR MASS;  Surgeon: Cheryle Debby LABOR, MD;  Location: [facility] OR;  Service: Neurosurgery;  Laterality: N/A;  RM 20   IR RADIOLOGIST EVAL & MGMT  07/14/2021   IR RADIOLOGIST EVAL & MGMT  07/27/2021   IR RADIOLOGIST EVAL & MGMT  01/30/2022   IR RADIOLOGIST EVAL & MGMT  05/08/2023   IR RADIOLOGIST EVAL & MGMT  11/13/2023   RADIOFREQUENCY ABLATION N/A 08/23/2021   Procedure: RENAL CRYO ABLATION;  Surgeon: Vanice Sharper, MD;  Location: [facility]S;  Service: Anesthesiology;  Laterality: N/A;   right shoulder rotator cuff repair Right    ROBOT ASSISTED LAPAROSCOPIC NEPHRECTOMY Left 01/05/2020   Procedure: XI ROBOTIC ASSISTED LAPAROSCOPIC RADICAL NEPHRECTOMY;  Surgeon: Devere Lonni Righter, MD;  Location: [facility];  Service: Urology;  Laterality: Left;   TOTAL HIP ARTHROPLASTY Left 03/31/2020   Procedure: LEFT TOTAL HIP ARTHROPLASTY ANTERIOR APPROACH;  Surgeon: Vernetta Lonni GRADE, MD;  Location: [facility] OR;  Service: Orthopedics;  Laterality: Left;   TRANSPHENOIDAL APPROACH EXPOSURE N/A 11/08/2021   Procedure: TRANSPHENOIDAL APPROACH EXPOSURE;  Surgeon: Mable Lenis, MD;  Location:  [facility] OR;  Service: ENT;  Laterality: N/A;   Patient Active Problem List   Diagnosis Date Noted   Left lower lobe pneumonia 03/06/2024   Hyperglycemia 03/06/2024   Acute respiratory failure with hypoxia (HCC) 03/06/2024   AKI (acute kidney injury) 03/06/2024   Hypothyroidism 03/06/2024   Hyperlipidemia 03/06/2024   Septic shock (HCC) 03/04/2024   Encounter for antineoplastic immunotherapy 05/01/2022   Status post transsphenoidal pituitary resection 11/08/2021   Metastasis to Pituitary (HCC) 11/08/2021   Secondary adrenal insufficiency 06/19/2021   Low testosterone  in male 06/19/2021   Pituitary macroadenoma (HCC) 06/19/2021   Coronary artery disease 06/13/2021   Gallstones 06/13/2021   Hardening of the aorta (main artery of the heart) 06/13/2021   History of hypercholesterolemia 06/13/2021   Low testosterone  06/13/2021   Osteoarthritis 06/13/2021   Other specified nonpsychotic mental disorders 06/13/2021   History of colonic polyps 06/13/2021   Personal history of other malignant neoplasm of kidney 06/13/2021   Pure hypercholesterolemia 06/13/2021   Tobacco use 06/13/2021   Secondary renal cell carcinoma of lung, right (HCC) 06/13/2021   Renal cell carcinoma of right kidney (HCC) 06/13/2021   Kidney cancer, primary, with metastasis from kidney to other site [facility]) 06/01/2021   Unilateral primary osteoarthritis, left knee 06/09/2020   Unilateral primary osteoarthritis, right knee 06/09/2020   Status post total replacement of left hip 03/31/2020   Unilateral  primary osteoarthritis, left hip 01/28/2020   Renal mass 01/05/2020    PCP: Sydelle Close, PA  REFERRING PROVIDER: Bertrum Gaskins, PA-C  REFERRING DIAG: M25.511 (ICD-10-CM) - Acute pain of right shoulder  THERAPY DIAG:  Chronic right shoulder pain  Stiffness of right shoulder, not elsewhere classified  Muscle weakness (generalized)  Abnormal posture  Rationale for Evaluation and Treatment: Rehabilitation  ONSET  DATE: chronic, though had a fall 4-5 months ago that triggered mobility issues   SUBJECTIVE:                                                                                                                                                                                      SUBJECTIVE STATEMENT: Shoulder feels less stiff today Hand dominance: Right  PERTINENT HISTORY: Patient reports that he tore his rotator cuff 10-15 years ago and underwent a repair that has led to some chronic pain and mobility deficits (not severe). Patient does report that he had a fall 4-5 months ago where he tripped over a grapevine while in the woods and landed on the Rt shoulder. Patient endorses no other injuries, surgeries, or trauma to the area or surrounding joints. Patient is currently in active cancer treatment several times per week and is on current prescription of daily prednisone  (7mg ).  See PMH or personal factors for in depth comorbidities   PAIN:  Are you having pain? Yes: NPRS scale: no pain, just stiffness Pain location: general Rt shoulder  Pain description: dull  Aggravating factors:   Relieving factors: stretches   PRECAUTIONS: Fall  RED FLAGS: Bowel or bladder incontinence: No and Cauda equina syndrome: No   WEIGHT BEARING RESTRICTIONS: No  FALLS:  Has patient fallen in last 6 months? Yes. 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  DIAGNOSTIC FINDINGS:  Right shoulder 3 views: Shoulder is well located.  Glenohumeral joint is  well-maintained.  Calcific changes about the rotator cuff.  No acute  fractures.  There is some slight irregularity to the cortices of the  proximal humeral shaft felt most  likely represents the insertion of the  deltoid.  No other bony abnormalities.  No acute fracture.   PATIENT SURVEYS :  PSFS: THE PATIENT SPECIFIC FUNCTIONAL SCALE  Place score of 0-10 (0 = unable to perform activity and 10 = able to perform activity at the same level as before injury or problem)  Activity Date: 04/08/2024    Lifting Rt arm  3    2. Lifting over 2 pounds  3    3. Washing hair without needing assistance  5    4.      Total Score 3.67      Total Score = Sum of activity scores/number of activities  Minimally Detectable Change: 3 points (for single activity); 2 points (for average score)  [facility] (nd). The Patient Specific Functional Scale . Retrieved from [email or web address]   COGNITION: Overall cognitive status: Within functional limits for tasks assessed     SENSATION: Not tested Patient subjectively endorses no n/t down the arm   POSTURE: Rounded shoulders, forward head, increased thoracic kyphosis   UPPER EXTREMITY ROM:   Active ROM Right Eval 04/08/2024 Left Eval 04/08/2024 Right 04/17/24   Shoulder flexion (seated) 120deg WFL 122 (standing)  Shoulder extension     Shoulder abduction (seated) 106deg  WFL  119 (standing)  Shoulder adduction     Shoulder internal rotation (seated) PSIS T9   Shoulder external rotation (seated) Lt ear, then can crawl to occiput T4   (Blank rows = not tested)  UPPER EXTREMITY MMT:  MMT Right Eval 04/08/2024 Left Eval 04/08/2024  Shoulder flexion (seated) 3+/5 5/5  Shoulder extension    Shoulder abduction (seated) 3+/5 5/5  Shoulder  adduction    Shoulder internal rotation (seated) 5/5 5/5  Shoulder external rotation (seated) 3+/5 4/5  Middle trapezius    Lower trapezius    Elbow flexion    Elbow extension    Wrist flexion    Wrist extension    Wrist ulnar deviation    Wrist radial deviation    Wrist pronation    Wrist supination    Grip strength (lbs)    (Blank rows = not tested)  PALPATION:  Not overly TTP in generalized Rt shoulder area                                                                                                                              TREATMENT DATE:  04/14/24 TherAct Pulleys x 3 min each flexion and abduction Standing IR stretch 10 x 10 sec hold with strap Wall ladder with 2# wrist weight and holds at end range x 10 reps; flexion and scaption Rows L4 band 2x10; 5 sec hold Shoulder ext L4 band 2x10; 5 sec hold Rt IR with L4 band 2x10; with ER stretch between sets Doorway Rt ER stretch 10 x 10 sec hold   04/09/2024 TherAct:  Pulleys into shoulder flexion and abduction 3 min each  Wall ladder into shoulder  flexion and abduction 1x10 with 3s holds   Neuro Re-Ed:  Standing rows with blue TB 2x15  Standing straight arm pulldowns with green TB 2x20 Supine horizontal abduction 2x12 with 2s scap retraction holds   TherEx:  Supine shoulder flexion with 2# DB going through full ROM x20  Sidelying shoulder abduction with 2# DB 2x10   04/08/2024 TherEx:  HEP handout provided with patient performing one set of each activity for appropriate form. Verbal and tactile cues provided.   Self-Care:  POC   PATIENT EDUCATION: Education details: HEP, POC Person educated: Patient Education method: Explanation, Demonstration, Tactile cues, Verbal cues, and Handouts Education comprehension: verbalized understanding, returned demonstration, verbal cues required, and tactile cues required  HOME EXERCISE PROGRAM: Access Code: [email or web address] URL: [email or web address] Date:  04/08/2024 Prepared by: Susannah Daring  Exercises - Supine Shoulder Flexion with Dowel  - 1 x daily - 7 x weekly - 2 sets - 10 reps - 8-10sec hold - Supine Shoulder External Rotation with Dowel  - 1 x daily - 7 x weekly - 2 sets - 10 reps - 8-10sec hold - Supine Shoulder Abduction AAROM with Dowel  - 1 x daily - 7 x weekly - 2 sets - 10 reps - 8-10sec hold - Standing Shoulder Row with Anchored Resistance  - 1 x daily - 7 x weekly - 2 sets - 10 reps - 3sec hold - Seated Scapular Retraction  - 1 x daily - 7 x weekly - 2 sets - 10 reps - 3sec hold  ASSESSMENT:  CLINICAL IMPRESSION: Improvement noted in abduction today with ROM, did incorporate some strengthening with ROM activities today which he tolerated well.  Continue skilled PT to maximize function.   OBJECTIVE IMPAIRMENTS: decreased coordination, decreased ROM, decreased strength, impaired flexibility, improper body mechanics, postural dysfunction, and pain.   ACTIVITY LIMITATIONS: lifting, bathing, reach over head, and hygiene/grooming  PARTICIPATION LIMITATIONS: cleaning, community activity, and yard work  PERSONAL FACTORS: Past/current experiences, Time since onset of injury/illness/exacerbation, and 3+ comorbidities: arthritis, cancer, HLD, hypothyroidism are also affecting patient's functional outcome.   REHAB POTENTIAL: Good  CLINICAL DECISION MAKING: Stable/uncomplicated  EVALUATION COMPLEXITY: Low  GOALS: Goals reviewed with patient? Yes  SHORT TERM GOALS: Target date: 04/29/2024  Patient will show compliance with initial HEP. Baseline: Goal status: INITIAL  2.  Patient will increase Rt shoulder abduction to at least 120deg in order to improve functional mobility.  Baseline:  Goal status: INITIAL    LONG TERM GOALS: Target date: 06/03/2024  Patient will be independent with final HEP in order to maintain and progress upon functional gains made within PT. Baseline:  Goal status: INITIAL  2.   Patient will  increase PSFS to at least 5.67 in order to show a significant improvement in subjective disability rating. Baseline:  Goal status: INITIAL  3. Patient will increase Rt shoulder abduction and flexion to at least 140deg in order to improve functional mobility.  Baseline:  Goal status: INITIAL  4.  Patient will increase Rt shoulder flexion and abduction MMT to at least 4/5 in order to improve biomechanics with functional mobility.  Baseline:  Goal status: INITIAL    PLAN: PT FREQUENCY: 1-2x/week  PT DURATION: 8 weeks  PLANNED INTERVENTIONS: [id number]- PT Re-evaluation, 97750- Physical Performance Testing, 97110-Therapeutic exercises, 97530- Therapeutic activity, [id number]- Neuromuscular re-education, 97535- Self Care, [id number]- Manual therapy, [id number]- Gait training, [id number]- Orthotic Initial, [id number]- Orthotic/Prosthetic subsequent, [phone]- Canalith repositioning, [id number]- Aquatic Therapy, H9716- Electrical stimulation (unattended), [id number]- Electrical stimulation (manual), [id number]- Vasopneumatic device,  [id number]- Ultrasound, 02987- Traction (mechanical), [id number]- Ionotophoresis 4mg /ml Dexamethasone , 79439 (1-2 muscles), [id number] (3+ muscles)- Dry Needling, Patient/Family education, Balance training, Stair training, Taping, Joint mobilization, Joint manipulation, Spinal manipulation, Spinal mobilization, Vestibular training, DME instructions, Cryotherapy, and Moist heat  PLAN FOR NEXT SESSION: continue ROM focus with strengthening as able,  postural strengthening, Rt shoulder ROM and strengthening    Corean JULIANNA Ku, PT, DPT 04/14/2024 11:42 AM    "

## 2024-04-15 ENCOUNTER — Inpatient Hospital Stay

## 2024-04-15 ENCOUNTER — Inpatient Hospital Stay: Attending: Oncology

## 2024-04-15 ENCOUNTER — Inpatient Hospital Stay: Admitting: Internal Medicine

## 2024-04-15 VITALS — BP 132/80 | HR 92 | Temp 97.8°F | Resp 17 | Ht 69.0 in | Wt 226.6 lb

## 2024-04-15 DIAGNOSIS — R197 Diarrhea, unspecified: Secondary | ICD-10-CM | POA: Diagnosis not present

## 2024-04-15 DIAGNOSIS — Z79620 Long term (current) use of immunosuppressive biologic: Secondary | ICD-10-CM | POA: Diagnosis not present

## 2024-04-15 DIAGNOSIS — Z5112 Encounter for antineoplastic immunotherapy: Secondary | ICD-10-CM | POA: Diagnosis present

## 2024-04-15 DIAGNOSIS — C7800 Secondary malignant neoplasm of unspecified lung: Secondary | ICD-10-CM | POA: Insufficient documentation

## 2024-04-15 DIAGNOSIS — C649 Malignant neoplasm of unspecified kidney, except renal pelvis: Secondary | ICD-10-CM

## 2024-04-15 DIAGNOSIS — C7989 Secondary malignant neoplasm of other specified sites: Secondary | ICD-10-CM | POA: Insufficient documentation

## 2024-04-15 DIAGNOSIS — C642 Malignant neoplasm of left kidney, except renal pelvis: Secondary | ICD-10-CM | POA: Insufficient documentation

## 2024-04-15 LAB — CBC WITH DIFFERENTIAL (CANCER CENTER ONLY)
Abs Immature Granulocytes: 0.05 K/uL (ref 0.00–0.07)
Basophils Absolute: 0.1 K/uL (ref 0.0–0.1)
Basophils Relative: 1 %
Eosinophils Absolute: 0.1 K/uL (ref 0.0–0.5)
Eosinophils Relative: 1 %
HCT: 43.7 % (ref 39.0–52.0)
Hemoglobin: 14.6 g/dL (ref 13.0–17.0)
Immature Granulocytes: 0 %
Lymphocytes Relative: 17 %
Lymphs Abs: 2 K/uL (ref 0.7–4.0)
MCH: 30.2 pg (ref 26.0–34.0)
MCHC: 33.4 g/dL (ref 30.0–36.0)
MCV: 90.3 fL (ref 80.0–100.0)
Monocytes Absolute: 0.6 K/uL (ref 0.1–1.0)
Monocytes Relative: 5 %
Neutro Abs: 8.8 K/uL — ABNORMAL HIGH (ref 1.7–7.7)
Neutrophils Relative %: 76 %
Platelet Count: 242 K/uL (ref 150–400)
RBC: 4.84 MIL/uL (ref 4.22–5.81)
RDW: 16.1 % — ABNORMAL HIGH (ref 11.5–15.5)
WBC Count: 11.6 K/uL — ABNORMAL HIGH (ref 4.0–10.5)
nRBC: 0 % (ref 0.0–0.2)

## 2024-04-15 LAB — CMP (CANCER CENTER ONLY)
ALT: 100 U/L — ABNORMAL HIGH (ref 0–44)
AST: 56 U/L — ABNORMAL HIGH (ref 15–41)
Albumin: 4.5 g/dL (ref 3.5–5.0)
Alkaline Phosphatase: 71 U/L (ref 38–126)
Anion gap: 15 (ref 5–15)
BUN: 15 mg/dL (ref 8–23)
CO2: 22 mmol/L (ref 22–32)
Calcium: 9.3 mg/dL (ref 8.9–10.3)
Chloride: 105 mmol/L (ref 98–111)
Creatinine: 1.04 mg/dL (ref 0.61–1.24)
GFR, Estimated: >60 mL/min
Glucose, Bld: 115 mg/dL — ABNORMAL HIGH (ref 70–99)
Potassium: 4 mmol/L (ref 3.5–5.1)
Sodium: 141 mmol/L (ref 135–145)
Total Bilirubin: 0.4 mg/dL (ref 0.0–1.2)
Total Protein: 7.4 g/dL (ref 6.5–8.1)

## 2024-04-15 LAB — TSH: TSH: <0.1 u[IU]/mL — ABNORMAL LOW (ref 0.350–4.500)

## 2024-04-15 MED ORDER — SODIUM CHLORIDE 0.9 % IV SOLN: Freq: Once | INTRAVENOUS | Status: AC

## 2024-04-15 MED ORDER — SODIUM CHLORIDE 0.9 % IV SOLN
200.0000 mg | Freq: Once | INTRAVENOUS | Status: AC
  Administered 2024-04-15: 200 mg via INTRAVENOUS
  Filled 2024-04-15: qty 200

## 2024-04-15 NOTE — Progress Notes (Signed)
 Verbal order received per Dr. Sherrod - Can receive treatment today with ALT of 100

## 2024-04-15 NOTE — Progress Notes (Signed)
 "     [facility] Telephone:([phone]) [phone]   [phone]  OFFICE PROGRESS NOTE  [name], [name], [place] 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    PRIOR THERAPY: 1) status post robotic assisted laparoscopic left radical nephrectomy by Dr. Devere on January 05, 2020.  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  The final pathological staging was T3aN0 grade 2 tumor.     2) Status post radiation therapy to the pulmonary nodules completed March 2023.  He received 50 Gray in 5 fractions.   3) Status post cryoablation to 2 lesions in the right kidney completed on Aug 23, 2021.   4) status post endoscopic transsphenoidal pituitary resection completed by Dr. Mable on November 08, 2021.  The final pathology showed clear-cell renal cell carcinoma.   5) SRS treatment to pituitary bed completed in October 2023.  He received 5 fractions for a total of 25 Gray.  CURRENT THERAPY: Keytruda  200 Mg IV every 3 weeks started Aug 04, 2021 status post 41 cycles with axitinib  5 mg p.o. daily started February 09, 2022.  INTERVAL HISTORY: TESEAN STUMP 69 y.o. male returns to the clinic today for follow-up visit. Discussed the use of AI scribe software for clinical note transcription with the patient, who gave verbal consent to proceed.  History of Present Illness Marc [name] is a 69 year old male with stage IV clear cell renal cell carcinoma with pulmonary and pituitary metastases who presents for pre-cycle evaluation prior to cycle 42 of immunotherapy.  He is currently receiving Cyramza and Keytruda  (200 mg IV every three weeks), with 41 cycles completed, in addition to daily axitinib  5 mg.  He continues to experience  intermittent diarrhea, but notes significant improvement since an episode of pneumonia in December 2025. He reports that his diarrhea has backed down significantly since his pneumonia in December 2025.  He sustained a fall approximately 5-6 months ago resulting in a shoulder injury, which has mostly healed but remains stiff. No other acute complaints or new symptoms are reported.     MEDICAL HISTORY: Past Medical History:  Diagnosis Date   Arthritis    Cancer (HCC)    Kidney, Lung, Pituitary   Hyperlipidemia    Hypothyroidism     ALLERGIES:  has no known allergies.  MEDICATIONS:  Current Outpatient Medications  Medication Sig Dispense Refill   Ascorbic Acid  (VITAMIN C PO) Take 1 tablet by mouth every evening.     aspirin  EC 81 MG tablet Take 81 mg by mouth every evening. Swallow whole.     azithromycin  (ZITHROMAX ) 250 MG tablet Take 1 tab daily 3 each 0   cholecalciferol  (VITAMIN D3) 25 MCG (1000 UNIT) tablet Take 1,000 Units by mouth every evening.     Ferrous Sulfate  (IRON SLOW RELEASE PO) Take 1 tablet by mouth every evening.     INLYTA  5 MG tablet TAKE 1 TABLET BY MOUTH DAILY 30 tablet 3   levothyroxine  (SYNTHROID ) 88 MCG tablet Take 1 tablet (88 mcg total) by mouth daily. 90 tablet 3   Multiple Vitamins-Minerals (MULTIVITAMIN WITH MINERALS) tablet Take 1 tablet by mouth every evening.     Omega-3 Fatty Acids (FISH OIL PO) Take 1 capsule by mouth every evening.  predniSONE  (DELTASONE ) 5 MG tablet TAKE 1 AND 1/2 TABLETS(7.5 MG) BY MOUTH DAILY WITH BREAKFAST. MAY DOUBLE UP DURING SICK DAYS 150 tablet 3   prochlorperazine  (COMPAZINE ) 10 MG tablet Take 1 tablet (10 mg total) by mouth every 6 (six) hours as needed for nausea or vomiting. 30 tablet 0   rosuvastatin  (CRESTOR ) 40 MG tablet Take 1 tablet (40 mg total) by mouth daily. 90 tablet 3   Testosterone  (ANDROGEL ) 20.25 MG/1.25GM (1.62%) GEL Place 3 Pump onto the skin as directed. (Patient taking differently: Place 3 Pump onto  the skin daily.) 150 g 5   vitamin E  180 MG (400 UNITS) capsule Take 400 Units by mouth every evening.     No current facility-administered medications for this visit.    SURGICAL HISTORY:  Past Surgical History:  Procedure Laterality Date   CRANIOTOMY N/A 11/08/2021   Procedure: ENDOSCOPIC ENDONASAL RESECTION OF SELLAR MASS;  Surgeon: Cheryle Debby LABOR, MD;  Location: [facility] OR;  Service: Neurosurgery;  Laterality: N/A;  RM 20   IR RADIOLOGIST EVAL & MGMT  07/14/2021   IR RADIOLOGIST EVAL & MGMT  07/27/2021   IR RADIOLOGIST EVAL & MGMT  01/30/2022   IR RADIOLOGIST EVAL & MGMT  05/08/2023   IR RADIOLOGIST EVAL & MGMT  11/13/2023   RADIOFREQUENCY ABLATION N/A 08/23/2021   Procedure: RENAL CRYO ABLATION;  Surgeon: Vanice Sharper, MD;  Location: [facility]S;  Service: Anesthesiology;  Laterality: N/A;   right shoulder rotator cuff repair Right    ROBOT ASSISTED LAPAROSCOPIC NEPHRECTOMY Left 01/05/2020   Procedure: XI ROBOTIC ASSISTED LAPAROSCOPIC RADICAL NEPHRECTOMY;  Surgeon: Devere Lonni Righter, MD;  Location: [facility];  Service: Urology;  Laterality: Left;   TOTAL HIP ARTHROPLASTY Left 03/31/2020   Procedure: LEFT TOTAL HIP ARTHROPLASTY ANTERIOR APPROACH;  Surgeon: Vernetta Lonni GRADE, MD;  Location: [facility] OR;  Service: Orthopedics;  Laterality: Left;   TRANSPHENOIDAL APPROACH EXPOSURE N/A 11/08/2021   Procedure: TRANSPHENOIDAL APPROACH EXPOSURE;  Surgeon: Mable Lenis, MD;  Location: [facility];  Service: ENT;  Laterality: N/A;    REVIEW OF SYSTEMS:  A comprehensive review of systems was negative except for: Musculoskeletal: positive for arthralgias   PHYSICAL EXAMINATION: General appearance: alert, cooperative, and no distress Head: Normocephalic, without obvious abnormality, atraumatic Neck: no adenopathy, no JVD, supple, symmetrical, trachea midline, and thyroid  not enlarged, symmetric, no tenderness/mass/nodules Lymph nodes: Cervical, supraclavicular, and axillary nodes normal. Resp: clear to  auscultation bilaterally Back: symmetric, no curvature. ROM normal. No CVA tenderness. Cardio: regular rate and rhythm, S1, S2 normal, no murmur, click, rub or gallop GI: soft, non-tender; bowel sounds normal; no masses,  no organomegaly Extremities: extremities normal, atraumatic, no cyanosis or edema  ECOG PERFORMANCE STATUS: 1 - Symptomatic but completely ambulatory  Blood pressure 132/80, pulse 92, temperature 97.8 F (36.6 C), temperature source Temporal, resp. rate 17, height 5' 9 (1.753 m), weight 226 lb 9.6 oz (102.8 kg), SpO2 96%.  LABORATORY DATA: Lab Results  Component Value Date   WBC 11.6 (H) 04/15/2024   HGB 14.6 04/15/2024   HCT 43.7 04/15/2024   MCV 90.3 04/15/2024   PLT 242 04/15/2024      Chemistry      Component Value Date/Time   NA 141 03/25/2024 0737   NA 140 08/09/2022 1014   K 4.0 03/25/2024 0737   CL 104 03/25/2024 0737   CO2 25 03/25/2024 0737   BUN 12 03/25/2024 0737   BUN 18 08/09/2022 1014   CREATININE 1.13 03/25/2024 0737  Component Value Date/Time   CALCIUM  9.1 03/25/2024 0737   ALKPHOS 92 03/25/2024 0737   AST 59 (H) 03/25/2024 0737   ALT 152 (H) 03/25/2024 0737   BILITOT 0.4 03/25/2024 0737       RADIOGRAPHIC STUDIES: XR Shoulder Right Result Date: 03/30/2024 Right shoulder 3 views: Shoulder is well located.  Glenohumeral joint is well-maintained.  Calcific changes about the rotator cuff.  No acute fractures.  There is some slight irregularity to the cortices of the proximal humeral shaft felt most likely represents the insertion of the deltoid.  No other bony abnormalities.  No acute fracture.  CT CHEST ABDOMEN PELVIS WO CONTRAST Result Date: 03/25/2024 EXAM: CT CHEST, ABDOMEN AND PELVIS WITHOUT CONTRAST 03/18/2024 12:03:39 PM TECHNIQUE: CT of the chest, abdomen and pelvis was performed without the administration of intravenous contrast. Multiplanar reformatted images are provided for review. Automated exposure control, iterative  reconstruction, and/or weight based adjustment of the mA/kV was utilized to reduce the radiation dose to as low as reasonably achievable. COMPARISON: CT 12/25/2023. CLINICAL HISTORY: Kidney cancer, staging; Metastatic disease evaluation. * Tracking Code: BO * FINDINGS: CHEST: MEDIASTINUM AND LYMPH NODES: Heart and pericardium are unremarkable. The central airways are clear. No mediastinal, hilar or axillary lymphadenopathy. LUNGS AND PLEURA: Bands of linear scarring in the right mid lung not changed from previous exam. Soft tissue on the pleural surface of the azygoesophageal recess of the right lower lobe measures 21 x 11 mm compared to 23 x 13 mm. No focal consolidation or pulmonary edema. No pleural effusion or pneumothorax. ABDOMEN AND PELVIS: LIVER: No evidence of hepatic metastasis on noncontrast exam. GALLBLADDER AND BILE DUCTS: Gallbladder is unremarkable. No biliary ductal dilatation. SPLEEN: No acute abnormality. PANCREAS: No acute abnormality. ADRENAL GLANDS: No acute abnormality. KIDNEYS, URETERS AND BLADDER: Post left nephrectomy anatomy. No evidence of local recurrence in the left nephrectomy bed. Right kidney is normal. No stones in the kidneys or ureters. No hydronephrosis. No perinephric or periureteral stranding. Urinary bladder is normal. GI AND BOWEL: Stomach demonstrates no acute abnormality. There is no bowel obstruction. REPRODUCTIVE ORGANS: No acute abnormality. PERITONEUM AND RETROPERITONEUM: No ascites. No free air. VASCULATURE: Aorta is normal in caliber. ABDOMINAL AND PELVIS LYMPH NODES: No lymphadenopathy. BONES AND SOFT TISSUES: Left hip prosthetic. No acute osseous abnormality. No focal soft tissue abnormality. IMPRESSION: 1. No evidence of progressive metastatic disease in the chest, abdomen, or pelvis on this noncontrast exam. 2. No evidence of local recurrence in the left nephrectomy bed. 3. Slightly decreased pleural-based soft tissue in the azygoesophageal recess of the right  lower lobe. 4. Stable pulmonary scarring in the mid right lung. Electronically signed by: Norleen Boxer MD 03/25/2024 09:57 AM EST RP Workstation: [id number]     ASSESSMENT AND PLAN: This is a very pleasant 69 years old white male diagnosed with stage IV clear-cell renal cell carcinoma with pulmonary involvement as well as pituitary metastasis in 2023.  The patient was initially diagnosed as stage III and October 2021 status post robotic assisted laparoscopic left radical nephrectomy followed by radiation therapy to the pulmonary nodules in March 2023 followed by cryoablation to 2 lesions in the right kidney completed in May 2023.  He is also status post endoscopic transsphenoidal pituitary resection on November 08, 2021.  He is also status post SRS to the pituitary bed in October 2023. The patient is currently undergoing treatment with Keytruda  200 Mg IV every 3 weeks status post 41 cycles.  This was started on Aug 04, 2021.  He is also on axitinib  5 mg p.o. daily since November 2023.   He has been tolerating this treatment fairly well. Assessment and Plan Assessment & Plan Stage IV clear cell renal cell carcinoma with pulmonary and pituitary metastases Metastatic clear cell renal cell carcinoma diagnosed in 2023, managed with ongoing immunotherapy (Cyramza and Keytruda ) and axitinib . He has completed 41 cycles. Laboratory results remain stable with no acute oncologic issues. - Proceed with cycle 42 of Cyramza and Keytruda  immunotherapy. - Continue axitinib  5 mg daily. - Follow up in three weeks.  Diarrhea secondary to cancer therapy Previously experienced diarrhea related to therapy, but reports significant improvement since an episode of pneumonia in December 2025. He still has some diarrhea, but it has improved since the pneumonia.  He was advised to call immediately if he has any other concerning symptoms in the interval. The patient voices understanding of current disease status and treatment  options and is in agreement with the current care plan.  All questions were answered. The patient knows to call the clinic with any problems, questions or concerns. We can certainly see the patient much sooner if necessary.  The total time spent in the appointment was 20 minutes including review of chart and various tests results, discussions about plan of care and coordination of care plan .   Disclaimer: This note was dictated with voice recognition software. Similar sounding words can inadvertently be transcribed and may not be corrected upon review.        "

## 2024-04-15 NOTE — Patient Instructions (Signed)
 [facility] - A DEPT OF [facility]  Discharge Instructions: Thank you for choosing [facility] to provide your oncology and hematology care.   If you have a lab appointment with the Cancer Center, please go directly to the Cancer Center and check in at the registration area.   Wear comfortable clothing and clothing appropriate for easy access to any Portacath or PICC line.   We strive to give you quality time with your provider. You may need to reschedule your appointment if you arrive late (15 or more minutes).  Arriving late affects you and other patients whose appointments are after yours.  Also, if you miss three or more appointments without notifying the office, you may be dismissed from the clinic at the provider's discretion.      For prescription refill requests, have your pharmacy contact our office and allow 72 hours for refills to be completed.    Today you received the following chemotherapy and/or immunotherapy agents keytruda       To help prevent nausea and vomiting after your treatment, we encourage you to take your nausea medication as directed.  BELOW ARE SYMPTOMS THAT SHOULD BE REPORTED IMMEDIATELY: *FEVER GREATER THAN 100.4 F (38 C) OR HIGHER *CHILLS OR SWEATING *NAUSEA AND VOMITING THAT IS NOT CONTROLLED WITH YOUR NAUSEA MEDICATION *UNUSUAL SHORTNESS OF BREATH *UNUSUAL BRUISING OR BLEEDING *URINARY PROBLEMS (pain or burning when urinating, or frequent urination) *BOWEL PROBLEMS (unusual diarrhea, constipation, pain near the anus) TENDERNESS IN MOUTH AND THROAT WITH OR WITHOUT PRESENCE OF ULCERS (sore throat, sores in mouth, or a toothache) UNUSUAL RASH, SWELLING OR PAIN  UNUSUAL VAGINAL DISCHARGE OR ITCHING   Items with * indicate a potential emergency and should be followed up as soon as possible or go to the Emergency Department if any problems should occur.  Please show the CHEMOTHERAPY ALERT CARD or IMMUNOTHERAPY  ALERT CARD at check-in to the Emergency Department and triage nurse.  Should you have questions after your visit or need to cancel or reschedule your appointment, please contact [facility] - A DEPT OF [facility]  Dept: [phone]  and follow the prompts.  Office hours are 8:00 a.m. to 4:30 p.m. Monday - Friday. Please note that voicemails left after 4:00 p.m. may not be returned until the following business day.  We are closed weekends and major holidays. You have access to a nurse at all times for urgent questions. Please call the main number to the clinic Dept: [phone] and follow the prompts.   For any non-urgent questions, you may also contact your provider using MyChart. We now offer e-Visits for anyone 47 and older to request care online for non-urgent symptoms. For details visit [email or web address].   Also download the MyChart app! Go to the app store, search MyChart, open the app, select [place], and log in with your MyChart username and password.

## 2024-04-16 LAB — T4: T4, Total: 6.4 ug/dL (ref 4.5–12.0)

## 2024-04-16 NOTE — Therapy (Signed)
 " OUTPATIENT PHYSICAL THERAPY UPPER EXTREMITY TREATMENT   Patient Name: Marc [name] MRN: [id number] DOB:[birth year], 69 y.o., male Today's Date: 04/17/2024  END OF SESSION:  PT End of Session - 04/17/24 1346     Visit Number 4    Number of Visits 16    Date for Recertification  06/03/24    Authorization Type MEDICARE    Progress Note Due on Visit 10    PT Start Time 1346    PT Stop Time 1428    PT Time Calculation (min) 42 min    Activity Tolerance Patient tolerated treatment well    Behavior During Therapy WFL for tasks assessed/performed             Past Medical History:  Diagnosis Date   Arthritis    Cancer (HCC)    Kidney, Lung, Pituitary   Hyperlipidemia    Hypothyroidism    Past Surgical History:  Procedure Laterality Date   CRANIOTOMY N/A 11/08/2021   Procedure: ENDOSCOPIC ENDONASAL RESECTION OF SELLAR MASS;  Surgeon: Cheryle Debby LABOR, MD;  Location: [facility] OR;  Service: Neurosurgery;  Laterality: N/A;  RM 20   IR RADIOLOGIST EVAL & MGMT  07/14/2021   IR RADIOLOGIST EVAL & MGMT  07/27/2021   IR RADIOLOGIST EVAL & MGMT  01/30/2022   IR RADIOLOGIST EVAL & MGMT  05/08/2023   IR RADIOLOGIST EVAL & MGMT  11/13/2023   RADIOFREQUENCY ABLATION N/A 08/23/2021   Procedure: RENAL CRYO ABLATION;  Surgeon: Vanice Sharper, MD;  Location: [facility];  Service: Anesthesiology;  Laterality: N/A;   right shoulder rotator cuff repair Right    ROBOT ASSISTED LAPAROSCOPIC NEPHRECTOMY Left 01/05/2020   Procedure: XI ROBOTIC ASSISTED LAPAROSCOPIC RADICAL NEPHRECTOMY;  Surgeon: Devere Lonni Righter, MD;  Location: [facility];  Service: Urology;  Laterality: Left;   TOTAL HIP ARTHROPLASTY Left 03/31/2020   Procedure: LEFT TOTAL HIP ARTHROPLASTY ANTERIOR APPROACH;  Surgeon: Vernetta Lonni GRADE, MD;  Location: [facility] OR;  Service: Orthopedics;  Laterality: Left;   TRANSPHENOIDAL APPROACH EXPOSURE N/A 11/08/2021   Procedure: TRANSPHENOIDAL APPROACH EXPOSURE;  Surgeon: Mable Lenis, MD;  Location:  [facility] OR;  Service: ENT;  Laterality: N/A;   Patient Active Problem List   Diagnosis Date Noted   Left lower lobe pneumonia 03/06/2024   Hyperglycemia 03/06/2024   Acute respiratory failure with hypoxia (HCC) 03/06/2024   AKI (acute kidney injury) 03/06/2024   Hypothyroidism 03/06/2024   Hyperlipidemia 03/06/2024   Septic shock (HCC) 03/04/2024   Encounter for antineoplastic immunotherapy 05/01/2022   Status post transsphenoidal pituitary resection 11/08/2021   Metastasis to Pituitary (HCC) 11/08/2021   Secondary adrenal insufficiency 06/19/2021   Low testosterone  in male 06/19/2021   Pituitary macroadenoma (HCC) 06/19/2021   Coronary artery disease 06/13/2021   Gallstones 06/13/2021   Hardening of the aorta (main artery of the heart) 06/13/2021   History of hypercholesterolemia 06/13/2021   Low testosterone  06/13/2021   Osteoarthritis 06/13/2021   Other specified nonpsychotic mental disorders 06/13/2021   History of colonic polyps 06/13/2021   Personal history of other malignant neoplasm of kidney 06/13/2021   Pure hypercholesterolemia 06/13/2021   Tobacco use 06/13/2021   Secondary renal cell carcinoma of lung, right (HCC) 06/13/2021   Renal cell carcinoma of right kidney (HCC) 06/13/2021   Kidney cancer, primary, with metastasis from kidney to other site [facility]) 06/01/2021   Unilateral primary osteoarthritis, left knee 06/09/2020   Unilateral primary osteoarthritis, right knee 06/09/2020   Status post total replacement of left hip 03/31/2020  Unilateral primary osteoarthritis, left hip 01/28/2020   Renal mass 01/05/2020    PCP: Sydelle Close, PA  REFERRING PROVIDER: Bertrum Gaskins, PA-C  REFERRING DIAG: M25.511 (ICD-10-CM) - Acute pain of right shoulder  THERAPY DIAG:  Chronic right shoulder pain  Stiffness of right shoulder, not elsewhere classified  Muscle weakness (generalized)  Abnormal posture  Rationale for Evaluation and Treatment: Rehabilitation  ONSET  DATE: chronic, though had a fall 4-5 months ago that triggered mobility issues   SUBJECTIVE:                                                                                                                                                                                      SUBJECTIVE STATEMENT: Patient reports that he is doing pulleys at home which has helped his stiffness.  Hand dominance: Right  PERTINENT HISTORY: Patient reports that he tore his rotator cuff 10-15 years ago and underwent a repair that has led to some chronic pain and mobility deficits (not severe). Patient does report that he had a fall 4-5 months ago where he tripped over a grapevine while in the woods and landed on the Rt shoulder. Patient endorses no other injuries, surgeries, or trauma to the area or surrounding joints. Patient is currently in active cancer treatment several times per week and is on current prescription of daily prednisone  (7mg ).  See PMH or personal factors for in depth comorbidities   PAIN:  Are you having pain? Yes: NPRS scale: no pain, just stiffness Pain location: general Rt shoulder  Pain description: dull  Aggravating factors:   Relieving factors: stretches   PRECAUTIONS: Fall  RED FLAGS: Bowel or bladder incontinence: No and Cauda equina syndrome: No   WEIGHT BEARING RESTRICTIONS: No  FALLS:  Has patient fallen in last 6 months? Yes. 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  DIAGNOSTIC FINDINGS:  Right shoulder 3 views: Shoulder is well located.  Glenohumeral joint is  well-maintained.  Calcific changes about the rotator cuff.  No acute  fractures.  There is some slight irregularity  to the cortices of the  proximal humeral shaft felt most likely represents the insertion of the  deltoid.  No other bony abnormalities.  No acute fracture.   PATIENT SURVEYS :  PSFS: THE PATIENT SPECIFIC FUNCTIONAL SCALE  Place score of 0-10 (0 = unable to perform activity and 10 = able to perform activity at the same level as before injury or problem)  Activity Date: 04/08/2024    Lifting Rt arm  3    2. Lifting over 2 pounds  3    3. Washing hair without needing assistance  5    4.      Total Score 3.67      Total Score = Sum of activity scores/number of activities  Minimally Detectable Change: 3 points (for single activity); 2 points (for average score)  [facility] (nd). The Patient Specific Functional Scale . Retrieved from [email or web address]   COGNITION: Overall cognitive status: Within functional limits for tasks assessed     SENSATION: Not tested Patient subjectively endorses no n/t down the arm   POSTURE: Rounded shoulders, forward head, increased thoracic kyphosis   UPPER EXTREMITY ROM:   Active ROM Right Eval 04/08/2024 Left Eval 04/08/2024 Right 04/17/24   Shoulder flexion (seated) 120deg WFL 122 (standing)  Shoulder extension     Shoulder abduction (seated) 106deg  WFL  119 (standing)  Shoulder adduction     Shoulder internal rotation (seated) PSIS T9   Shoulder external rotation (seated) Lt ear, then can crawl to occiput T4   (Blank rows = not tested)  UPPER EXTREMITY MMT:  MMT Right Eval 04/08/2024 Left Eval 04/08/2024  Shoulder flexion (seated) 3+/5 5/5  Shoulder extension     Shoulder abduction (seated) 3+/5 5/5  Shoulder adduction    Shoulder internal rotation (seated) 5/5 5/5  Shoulder external rotation (seated) 3+/5 4/5  Middle trapezius    Lower trapezius    Elbow flexion    Elbow extension    Wrist flexion    Wrist extension    Wrist ulnar deviation    Wrist radial deviation    Wrist pronation    Wrist supination    Grip strength (lbs)    (Blank rows = not tested)  PALPATION:  Not overly TTP in generalized Rt shoulder area                                                                                                                              TREATMENT DATE:  04/17/2024 TherAct: Pulleys 3 min flexion, 3 min abduction  Wall ladder with 1.5# wrist weight 1x15 in flexion, then 1x7 scaption/abduction Standing extension stretch with PVC 1x10 with 5s holds  Standing IR stretch with strap 1x10 with 10s holds  Doorway ER stretch 1x10 with 10s holds   Neuro Re-Ed:  Standing rows with 35# on cable machine 2x12 with 3s holds  Supine D1 flexion 1x10 with red TB (highly difficult), 2x10 with 2# weight with  improved performance  Supine D2 flexion 1x10 with 2# weight    04/14/24 TherAct Pulleys x 3 min each flexion and abduction Standing IR stretch 10 x 10 sec hold with strap Wall ladder with 2# wrist weight and holds at end range x 10 reps; flexion and scaption Rows L4 band 2x10; 5 sec hold Shoulder ext L4 band 2x10; 5 sec hold Rt IR with L4 band 2x10; with ER stretch between sets Doorway Rt ER stretch 10 x 10 sec hold   04/09/2024 TherAct:  Pulleys into shoulder flexion and abduction 3 min each  Wall ladder into shoulder flexion and abduction 1x10 with 3s holds   Neuro Re-Ed:  Standing rows with blue TB 2x15  Standing straight arm pulldowns with green TB 2x20 Supine horizontal abduction 2x12 with 2s scap retraction holds   TherEx:  Supine shoulder flexion with 2# DB going through full ROM x20  Sidelying shoulder abduction with 2# DB  2x10   04/08/2024 TherEx:  HEP handout provided with patient performing one set of each activity for appropriate form. Verbal and tactile cues provided.   Self-Care:  POC   PATIENT EDUCATION: Education details: HEP, POC Person educated: Patient Education method: Explanation, Demonstration, Tactile cues, Verbal cues, and Handouts Education comprehension: verbalized understanding, returned demonstration, verbal cues required, and tactile cues required  HOME EXERCISE PROGRAM: Access Code: [email or web address] URL: [email or web address] Date: 04/08/2024 Prepared by: Susannah Daring  Exercises - Supine Shoulder Flexion with Dowel  - 1 x daily - 7 x weekly - 2 sets - 10 reps - 8-10sec hold - Supine Shoulder External Rotation with Dowel  - 1 x daily - 7 x weekly - 2 sets - 10 reps - 8-10sec hold - Supine Shoulder Abduction AAROM with Dowel  - 1 x daily - 7 x weekly - 2 sets - 10 reps - 8-10sec hold - Standing Shoulder Row with Anchored Resistance  - 1 x daily - 7 x weekly - 2 sets - 10 reps - 3sec hold - Seated Scapular Retraction  - 1 x daily - 7 x weekly - 2 sets - 10 reps - 3sec hold  ASSESSMENT:  CLINICAL IMPRESSION: Patient arrived to session noting continued stiffness, though is improving. Patient tolerated all activities this date with no increased pain and was able to tolerate increased intensity. Patient will continue to benefit from skilled PT.   OBJECTIVE IMPAIRMENTS: decreased coordination, decreased ROM, decreased strength, impaired flexibility, improper body mechanics, postural dysfunction, and pain.   ACTIVITY LIMITATIONS: lifting, bathing, reach over head, and hygiene/grooming  PARTICIPATION LIMITATIONS: cleaning, community activity, and yard work  PERSONAL FACTORS: Past/current experiences, Time since onset of injury/illness/exacerbation, and 3+ comorbidities: arthritis, cancer, HLD, hypothyroidism are also affecting patient's functional outcome.   REHAB POTENTIAL:  Good  CLINICAL DECISION MAKING: Stable/uncomplicated  EVALUATION COMPLEXITY: Low  GOALS: Goals reviewed with patient? Yes  SHORT TERM GOALS: Target date: 04/29/2024  Patient will show compliance with initial HEP. Baseline: Goal status: INITIAL  2.  Patient will increase Rt shoulder abduction to at least 120deg in order to improve functional mobility.  Baseline:  Goal status: INITIAL    LONG TERM GOALS: Target date: 06/03/2024  Patient will be independent with final HEP in order to maintain and progress upon functional gains made within PT. Baseline:  Goal status: INITIAL  2.   Patient will increase PSFS to at least 5.67 in order to show a significant improvement in subjective disability rating. Baseline:  Goal status: INITIAL  3. Patient will increase  Rt shoulder abduction and flexion to at least 140deg in order to improve functional mobility.  Baseline:  Goal status: INITIAL  4.  Patient will increase Rt shoulder flexion and abduction MMT to at least 4/5 in order to improve biomechanics with functional mobility.  Baseline:  Goal status: INITIAL    PLAN: PT FREQUENCY: 1-2x/week  PT DURATION: 8 weeks  PLANNED INTERVENTIONS: [id number]- PT Re-evaluation, 97750- Physical Performance Testing, 97110-Therapeutic exercises, 97530- Therapeutic activity, [id number]- Neuromuscular re-education, 97535- Self Care, [id number]- Manual therapy, [id number]- Gait training, [phone]- Orthotic Initial, [phone]- Orthotic/Prosthetic subsequent, [phone]- Canalith repositioning, [phone]- Aquatic Therapy, [phone]- Electrical stimulation (unattended), [phone]- Electrical stimulation (manual), [id number]- Vasopneumatic device, [id number]- Ultrasound, [id number]- Traction (mechanical), [id number]- Ionotophoresis 4mg /ml Dexamethasone , [id number] (1-2 muscles), [id number] (3+ muscles)- Dry Needling, Patient/Family education, Balance training, Stair training, Taping, Joint mobilization, Joint manipulation, Spinal manipulation, Spinal mobilization, Vestibular  training, DME instructions, Cryotherapy, and Moist heat  PLAN FOR NEXT SESSION:  continue ROM focus with strengthening as able,  postural strengthening, Rt shoulder ROM and strengthening    Susannah Daring, PT, DPT 04/17/24 3:38 PM      "

## 2024-04-17 ENCOUNTER — Ambulatory Visit

## 2024-04-17 DIAGNOSIS — R293 Abnormal posture: Secondary | ICD-10-CM

## 2024-04-17 DIAGNOSIS — M25611 Stiffness of right shoulder, not elsewhere classified: Secondary | ICD-10-CM

## 2024-04-17 DIAGNOSIS — G8929 Other chronic pain: Secondary | ICD-10-CM | POA: Diagnosis not present

## 2024-04-17 DIAGNOSIS — M6281 Muscle weakness (generalized): Secondary | ICD-10-CM

## 2024-04-17 DIAGNOSIS — M25511 Pain in right shoulder: Secondary | ICD-10-CM

## 2024-04-19 NOTE — Therapy (Signed)
 " OUTPATIENT PHYSICAL THERAPY UPPER EXTREMITY TREATMENT   Patient Name: Marc [name] MRN: [id number] DOB:1955/04/19, 69 y.o., male Today's Date: 04/21/2024  END OF SESSION:  PT End of Session - 04/21/24 1140     Visit Number 5    Number of Visits 16    Date for Recertification  06/03/24    Authorization Type MEDICARE    PT Start Time 1100    PT Stop Time 1138    PT Time Calculation (min) 38 min    Activity Tolerance Patient tolerated treatment well    Behavior During Therapy WFL for tasks assessed/performed              Past Medical History:  Diagnosis Date   Arthritis    Cancer (HCC)    Kidney, Lung, Pituitary   Hyperlipidemia    Hypothyroidism    Past Surgical History:  Procedure Laterality Date   CRANIOTOMY N/A 11/08/2021   Procedure: ENDOSCOPIC ENDONASAL RESECTION OF SELLAR MASS;  Surgeon: Cheryle Debby LABOR, MD;  Location: [facility] OR;  Service: Neurosurgery;  Laterality: N/A;  RM 20   IR RADIOLOGIST EVAL & MGMT  07/14/2021   IR RADIOLOGIST EVAL & MGMT  07/27/2021   IR RADIOLOGIST EVAL & MGMT  01/30/2022   IR RADIOLOGIST EVAL & MGMT  05/08/2023   IR RADIOLOGIST EVAL & MGMT  11/13/2023   RADIOFREQUENCY ABLATION N/A 08/23/2021   Procedure: RENAL CRYO ABLATION;  Surgeon: Vanice Sharper, MD;  Location: [facility];  Service: Anesthesiology;  Laterality: N/A;   right shoulder rotator cuff repair Right    ROBOT ASSISTED LAPAROSCOPIC NEPHRECTOMY Left 01/05/2020   Procedure: XI ROBOTIC ASSISTED LAPAROSCOPIC RADICAL NEPHRECTOMY;  Surgeon: Devere Lonni Righter, MD;  Location: [facility];  Service: Urology;  Laterality: Left;   TOTAL HIP ARTHROPLASTY Left 03/31/2020   Procedure: LEFT TOTAL HIP ARTHROPLASTY ANTERIOR APPROACH;  Surgeon: Vernetta Lonni GRADE, MD;  Location: [facility] OR;  Service: Orthopedics;  Laterality: Left;   TRANSPHENOIDAL APPROACH EXPOSURE N/A 11/08/2021   Procedure: TRANSPHENOIDAL APPROACH EXPOSURE;  Surgeon: Mable Lenis, MD;  Location: [facility];  Service: ENT;   Laterality: N/A;   Patient Active Problem List   Diagnosis Date Noted   Left lower lobe pneumonia 03/06/2024   Hyperglycemia 03/06/2024   Acute respiratory failure with hypoxia (HCC) 03/06/2024   AKI (acute kidney injury) 03/06/2024   Hypothyroidism 03/06/2024   Hyperlipidemia 03/06/2024   Septic shock (HCC) 03/04/2024   Encounter for antineoplastic immunotherapy 05/01/2022   Status post transsphenoidal pituitary resection 11/08/2021   Metastasis to Pituitary (HCC) 11/08/2021   Secondary adrenal insufficiency 06/19/2021   Low testosterone  in male 06/19/2021   Pituitary macroadenoma (HCC) 06/19/2021   Coronary artery disease 06/13/2021   Gallstones 06/13/2021   Hardening of the aorta (main artery of the heart) 06/13/2021   History of hypercholesterolemia 06/13/2021   Low testosterone  06/13/2021   Osteoarthritis 06/13/2021   Other specified nonpsychotic mental disorders 06/13/2021   History of colonic polyps 06/13/2021   Personal history of other malignant neoplasm of kidney 06/13/2021   Pure hypercholesterolemia 06/13/2021   Tobacco use 06/13/2021   Secondary renal cell carcinoma of lung, right (HCC) 06/13/2021   Renal cell carcinoma of right kidney (HCC) 06/13/2021   Kidney cancer, primary, with metastasis from kidney to other site [facility]) 06/01/2021   Unilateral primary osteoarthritis, left knee 06/09/2020   Unilateral primary osteoarthritis, right knee 06/09/2020   Status post total replacement of left hip 03/31/2020   Unilateral primary osteoarthritis, left hip 01/28/2020  Renal mass 01/05/2020    PCP: Sydelle Close, PA  REFERRING PROVIDER: Bertrum Gaskins, PA-C  REFERRING DIAG: M25.511 (ICD-10-CM) - Acute pain of right shoulder  THERAPY DIAG:  Chronic right shoulder pain  Stiffness of right shoulder, not elsewhere classified  Muscle weakness (generalized)  Abnormal posture  Rationale for Evaluation and Treatment: Rehabilitation  ONSET DATE: chronic, though had  a fall 4-5 months ago that triggered mobility issues   SUBJECTIVE:                                                                                                                                                                                      SUBJECTIVE STATEMENT: Patient reports feeling stronger.  Hand dominance: Right  PERTINENT HISTORY: Patient reports that he tore his rotator cuff 10-15 years ago and underwent a repair that has led to some chronic pain and mobility deficits (not severe). Patient does report that he had a fall 4-5 months ago where he tripped over a grapevine while in the woods and landed on the Rt shoulder. Patient endorses no other injuries, surgeries, or trauma to the area or surrounding joints. Patient is currently in active cancer treatment several times per week and is on current prescription of daily prednisone  (7mg ).  See PMH or personal factors for in depth comorbidities   PAIN:  Are you having pain? Yes: NPRS scale: no pain, just stiffness Pain location: general Rt shoulder  Pain description: dull  Aggravating factors:   Relieving factors: stretches   PRECAUTIONS: Fall  RED FLAGS: Bowel or bladder incontinence: No and Cauda equina syndrome: No   WEIGHT BEARING RESTRICTIONS: No  FALLS:  Has patient fallen in last 6 months? Yes. 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  DIAGNOSTIC FINDINGS:  Right shoulder 3 views: Shoulder is well located.  Glenohumeral joint is  well-maintained.  Calcific changes about the rotator cuff.  No acute  fractures.  There is some slight irregularity to the cortices of the  proximal humeral shaft felt most likely represents the insertion of the  deltoid.  No other bony abnormalities.  No acute fracture.   PATIENT SURVEYS :  PSFS: THE PATIENT SPECIFIC FUNCTIONAL SCALE  Place score of 0-10 (0 = unable to perform activity and 10 = able to perform activity at the same level as before injury or problem)  Activity Date: 04/08/2024    Lifting Rt arm  3    2. Lifting over 2 pounds  3    3. Washing hair without needing assistance  5    4.      Total Score 3.67      Total Score = Sum of activity scores/number of activities  Minimally Detectable Change: 3 points (for single activity); 2 points (for average score)  [facility] (nd). The Patient Specific Functional Scale . Retrieved from [email or web address]   COGNITION: Overall cognitive status: Within functional limits for tasks assessed     SENSATION: Not tested Patient subjectively endorses no n/t down the arm   POSTURE: Rounded shoulders, forward head, increased thoracic kyphosis   UPPER EXTREMITY ROM:   Active ROM Right Eval 04/08/2024 Left Eval 04/08/2024 Right 04/17/24   Shoulder flexion (seated) 120deg WFL 122 (standing)  Shoulder extension     Shoulder abduction (seated) 106deg  WFL  119 (standing)  Shoulder adduction     Shoulder internal rotation (seated) PSIS T9   Shoulder external rotation (seated) Lt ear, then can crawl to occiput T4   (Blank rows = not tested)  UPPER EXTREMITY MMT:  MMT Right Eval 04/08/2024 Left Eval 04/08/2024  Shoulder flexion (seated) 3+/5 5/5  Shoulder extension    Shoulder abduction (seated) 3+/5 5/5  Shoulder adduction    Shoulder  internal rotation (seated) 5/5 5/5  Shoulder external rotation (seated) 3+/5 4/5  Middle trapezius    Lower trapezius    Elbow flexion    Elbow extension    Wrist flexion    Wrist extension    Wrist ulnar deviation    Wrist radial deviation    Wrist pronation    Wrist supination    Grip strength (lbs)    (Blank rows = not tested)  PALPATION:  Not overly TTP in generalized Rt shoulder area                                                                                                                              TREATMENT DATE: R shoulder 04/21/24 There Ex Pulleys 3 min flexion, 3 min abduction  There Act Doorway Rt ER stretch 10 x 10 sec hold Standing IR stretch 10 x 10 sec hold with strap Wall ladder with 2# wrist weight 1x15 in flexion, then 1x7 scaption/abduction Supine serratus punches 2# DB 2x10 Neuro TB Rows 3x10 Blue TB ext 3x10 blue  04/17/2024 TherAct: Pulleys 3 min flexion, 3 min abduction  Wall ladder with 1.5# wrist weight 1x15 in flexion, then 1x7 scaption/abduction Standing extension stretch with PVC 1x10 with 5s holds  Standing IR stretch with strap  1x10 with 10s holds  Doorway ER stretch 1x10 with 10s holds  Scaption   Neuro Re-Ed:  Standing rows with 35# on cable machine 2x12 with 3s holds  Supine D1 flexion 1x10 with red TB (highly difficult), 2x10 with 2# weight with improved performance  Supine D2 flexion 1x10 with 2# weight    04/14/24 TherAct Pulleys x 3 min each flexion and abduction Standing IR stretch 10 x 10 sec hold with strap Wall ladder with 2# wrist weight and holds at end range x 10 reps; flexion and scaption Rows L4 band 2x10; 5 sec hold Shoulder ext L4 band 2x10; 5 sec hold Rt IR with L4 band 2x10; with ER stretch between sets Doorway Rt ER stretch 10 x 10 sec hold  PATIENT EDUCATION: Education details: HEP, POC Person educated: Patient Education method: Explanation, Demonstration, Tactile cues, Verbal cues, and  Handouts Education comprehension: verbalized understanding, returned demonstration, verbal cues required, and tactile cues required  HOME EXERCISE PROGRAM: Access Code: [email or web address] URL: [email or web address] Date: 04/08/2024 Prepared by: Susannah Daring  Exercises - Supine Shoulder Flexion with Dowel  - 1 x daily - 7 x weekly - 2 sets - 10 reps - 8-10sec hold - Supine Shoulder External Rotation with Dowel  - 1 x daily - 7 x weekly - 2 sets - 10 reps - 8-10sec hold - Supine Shoulder Abduction AAROM with Dowel  - 1 x daily - 7 x weekly - 2 sets - 10 reps - 8-10sec hold - Standing Shoulder Row with Anchored Resistance  - 1 x daily - 7 x weekly - 2 sets - 10 reps - 3sec hold - Seated Scapular Retraction  - 1 x daily - 7 x weekly - 2 sets - 10 reps - 3sec hold  ASSESSMENT:  CLINICAL IMPRESSION: Patient needed VC and tactile cues for R shoulder hiking with scaption exercise.  Better form with height adjusted and after cuing.  OBJECTIVE IMPAIRMENTS: decreased coordination, decreased ROM, decreased strength, impaired flexibility, improper body mechanics, postural dysfunction, and pain.   ACTIVITY LIMITATIONS: lifting, bathing, reach over head, and hygiene/grooming  PARTICIPATION LIMITATIONS: cleaning, community activity, and yard work  PERSONAL FACTORS: Past/current experiences, Time since onset of injury/illness/exacerbation, and 3+ comorbidities: arthritis, cancer, HLD, hypothyroidism are also affecting patient's functional outcome.   REHAB POTENTIAL: Good  CLINICAL DECISION MAKING: Stable/uncomplicated  EVALUATION COMPLEXITY: Low  GOALS: Goals reviewed with patient? Yes  SHORT TERM GOALS: Target date: 04/29/2024  Patient will show compliance with initial HEP. Baseline: Goal status: MET  2.  Patient will increase Rt shoulder abduction to at least 120deg in order to improve functional mobility.  Baseline:  Goal status: INITIAL    LONG TERM GOALS: Target date:  06/03/2024  Patient will be independent with final HEP in order to maintain and progress upon functional gains made within PT. Baseline:  Goal status: INITIAL  2.   Patient will increase PSFS to at least 5.67 in order to show a significant improvement in subjective disability rating. Baseline:  Goal status: INITIAL  3. Patient will increase Rt shoulder abduction and flexion to at least 140deg in order to improve functional mobility.  Baseline:  Goal status: INITIAL  4.  Patient will increase Rt shoulder flexion and abduction MMT to at least 4/5 in order to improve biomechanics with functional mobility.  Baseline:  Goal status: INITIAL    PLAN: PT FREQUENCY: 1-2x/week  PT DURATION: 8 weeks  PLANNED INTERVENTIONS: [id number]- PT Re-evaluation, 97750- Physical Performance Testing, 97110-Therapeutic exercises, 97530- Therapeutic  activity, [id number]- Neuromuscular re-education, [phone]- Self Care, [id number]- Manual therapy, [id number]- Gait training, [phone]- Orthotic Initial, [id number]- Orthotic/Prosthetic subsequent, [phone]- Canalith repositioning, [id number]- Aquatic Therapy, [phone]- Electrical stimulation (unattended), [phone]- Electrical stimulation (manual), [id number]- Vasopneumatic device, [id number]- Ultrasound, [id number]- Traction (mechanical), [id number]- Ionotophoresis 4mg /ml Dexamethasone , [id number] (1-2 muscles), [id number] (3+ muscles)- Dry Needling, Patient/Family education, Balance training, Stair training, Taping, Joint mobilization, Joint manipulation, Spinal manipulation, Spinal mobilization, Vestibular training, DME instructions, Cryotherapy, and Moist heat  PLAN FOR NEXT SESSION:  continue ROM focus with strengthening as able,  postural strengthening, Rt shoulder ROM and strengthening   Burnard Meth, PT 04/21/24  11:43 AM     "

## 2024-04-21 ENCOUNTER — Ambulatory Visit

## 2024-04-21 DIAGNOSIS — M25611 Stiffness of right shoulder, not elsewhere classified: Secondary | ICD-10-CM | POA: Diagnosis not present

## 2024-04-21 DIAGNOSIS — G8929 Other chronic pain: Secondary | ICD-10-CM | POA: Diagnosis not present

## 2024-04-21 DIAGNOSIS — M6281 Muscle weakness (generalized): Secondary | ICD-10-CM

## 2024-04-21 DIAGNOSIS — R293 Abnormal posture: Secondary | ICD-10-CM

## 2024-04-21 DIAGNOSIS — M25511 Pain in right shoulder: Secondary | ICD-10-CM

## 2024-04-22 ENCOUNTER — Other Ambulatory Visit: Payer: Self-pay | Admitting: Internal Medicine

## 2024-04-22 NOTE — Therapy (Signed)
 " OUTPATIENT PHYSICAL THERAPY UPPER EXTREMITY TREATMENT   Patient Name: Marc [name] MRN: [id number] DOB:10-08-1955, 69 y.o., male Today's Date: 04/23/2024  END OF SESSION:  PT End of Session - 04/23/24 1101     Visit Number 6    Number of Visits 16    Date for Recertification  06/03/24    Authorization Type MEDICARE    PT Start Time 1101    PT Stop Time 1131    PT Time Calculation (min) 30 min    Activity Tolerance Patient tolerated treatment well    Behavior During Therapy WFL for tasks assessed/performed               Past Medical History:  Diagnosis Date   Arthritis    Cancer (HCC)    Kidney, Lung, Pituitary   Hyperlipidemia    Hypothyroidism    Past Surgical History:  Procedure Laterality Date   CRANIOTOMY N/A 11/08/2021   Procedure: ENDOSCOPIC ENDONASAL RESECTION OF SELLAR MASS;  Surgeon: Cheryle Debby LABOR, MD;  Location: [facility] OR;  Service: Neurosurgery;  Laterality: N/A;  RM 20   IR RADIOLOGIST EVAL & MGMT  07/14/2021   IR RADIOLOGIST EVAL & MGMT  07/27/2021   IR RADIOLOGIST EVAL & MGMT  01/30/2022   IR RADIOLOGIST EVAL & MGMT  05/08/2023   IR RADIOLOGIST EVAL & MGMT  11/13/2023   RADIOFREQUENCY ABLATION N/A 08/23/2021   Procedure: RENAL CRYO ABLATION;  Surgeon: Vanice Sharper, MD;  Location: [facility] ORS;  Service: Anesthesiology;  Laterality: N/A;   right shoulder rotator cuff repair Right    ROBOT ASSISTED LAPAROSCOPIC NEPHRECTOMY Left 01/05/2020   Procedure: XI ROBOTIC ASSISTED LAPAROSCOPIC RADICAL NEPHRECTOMY;  Surgeon: Devere Lonni Righter, MD;  Location: [facility];  Service: Urology;  Laterality: Left;   TOTAL HIP ARTHROPLASTY Left 03/31/2020   Procedure: LEFT TOTAL HIP ARTHROPLASTY ANTERIOR APPROACH;  Surgeon: Vernetta Lonni GRADE, MD;  Location: [facility] OR;  Service: Orthopedics;  Laterality: Left;   TRANSPHENOIDAL APPROACH EXPOSURE N/A 11/08/2021   Procedure: TRANSPHENOIDAL APPROACH EXPOSURE;  Surgeon: Mable Lenis, MD;  Location: [facility] OR;  Service: ENT;   Laterality: N/A;   Patient Active Problem List   Diagnosis Date Noted   Left lower lobe pneumonia 03/06/2024   Hyperglycemia 03/06/2024   Acute respiratory failure with hypoxia (HCC) 03/06/2024   AKI (acute kidney injury) 03/06/2024   Hypothyroidism 03/06/2024   Hyperlipidemia 03/06/2024   Septic shock (HCC) 03/04/2024   Encounter for antineoplastic immunotherapy 05/01/2022   Status post transsphenoidal pituitary resection 11/08/2021   Metastasis to Pituitary (HCC) 11/08/2021   Secondary adrenal insufficiency 06/19/2021   Low testosterone  in male 06/19/2021   Pituitary macroadenoma (HCC) 06/19/2021   Coronary artery disease 06/13/2021   Gallstones 06/13/2021   Hardening of the aorta (main artery of the heart) 06/13/2021   History of hypercholesterolemia 06/13/2021   Low testosterone  06/13/2021   Osteoarthritis 06/13/2021   Other specified nonpsychotic mental disorders 06/13/2021   History of colonic polyps 06/13/2021   Personal history of other malignant neoplasm of kidney 06/13/2021   Pure hypercholesterolemia 06/13/2021   Tobacco use 06/13/2021   Secondary renal cell carcinoma of lung, right (HCC) 06/13/2021   Renal cell carcinoma of right kidney (HCC) 06/13/2021   Kidney cancer, primary, with metastasis from kidney to other site [facility]) 06/01/2021   Unilateral primary osteoarthritis, left knee 06/09/2020   Unilateral primary osteoarthritis, right knee 06/09/2020   Status post total replacement of left hip 03/31/2020   Unilateral primary osteoarthritis, left hip 01/28/2020  Renal mass 01/05/2020    PCP: Sydelle Close, PA  REFERRING PROVIDER: Bertrum Gaskins, PA-C  REFERRING DIAG: M25.511 (ICD-10-CM) - Acute pain of right shoulder  THERAPY DIAG:  Chronic right shoulder pain  Stiffness of right shoulder, not elsewhere classified  Muscle weakness (generalized)  Abnormal posture  Rationale for Evaluation and Treatment: Rehabilitation  ONSET DATE: chronic, though had  a fall 4-5 months ago that triggered mobility issues   SUBJECTIVE:                                                                                                                                                                                      SUBJECTIVE STATEMENT: Patient reports feeling no pain with activities.  Hand dominance: Right  PERTINENT HISTORY: Patient reports that he tore his rotator cuff 10-15 years ago and underwent a repair that has led to some chronic pain and mobility deficits (not severe). Patient does report that he had a fall 4-5 months ago where he tripped over a grapevine while in the woods and landed on the Rt shoulder. Patient endorses no other injuries, surgeries, or trauma to the area or surrounding joints. Patient is currently in active cancer treatment several times per week and is on current prescription of daily prednisone  (7mg ).  See PMH or personal factors for in depth comorbidities   PAIN:  Are you having pain? Yes: NPRS scale: no pain, just stiffness Pain location: general Rt shoulder  Pain description: dull  Aggravating factors:   Relieving factors: stretches   PRECAUTIONS: Fall  RED FLAGS: Bowel or bladder incontinence: No and Cauda equina syndrome: No   WEIGHT BEARING RESTRICTIONS: No  FALLS:  Has patient fallen in last 6 months? Yes. 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  DIAGNOSTIC FINDINGS:  Right shoulder 3 views: Shoulder is well located.  Glenohumeral joint is  well-maintained.  Calcific changes about the rotator cuff.  No acute  fractures.  There is some slight irregularity to the cortices of the  proximal humeral shaft felt most likely represents the insertion  of the  deltoid.  No other bony abnormalities.  No acute fracture.   PATIENT SURVEYS :  PSFS: THE PATIENT SPECIFIC FUNCTIONAL SCALE  Place score of 0-10 (0 = unable to perform activity and 10 = able to perform activity at the same level as before injury or problem)  Activity Date: 04/08/2024    Lifting Rt arm  3    2. Lifting over 2 pounds  3    3. Washing hair without needing assistance  5    4.      Total Score 3.67      Total Score = Sum of activity scores/number of activities  Minimally Detectable Change: 3 points (for single activity); 2 points (for average score)  [facility] (nd). The Patient Specific Functional Scale . Retrieved from [email or web address].pt   COGNITION: Overall cognitive status: Within functional limits for tasks assessed     SENSATION: Not tested Patient subjectively endorses no n/t down the arm   POSTURE: Rounded shoulders, forward head, increased thoracic kyphosis   UPPER EXTREMITY ROM:   Active ROM Right Eval 04/08/2024 Left Eval 04/08/2024 Right 04/17/24   Shoulder flexion (seated) 120deg WFL 122 (standing)  Shoulder extension     Shoulder abduction (seated) 106deg  WFL  119 (standing)  Shoulder adduction     Shoulder internal rotation (seated) PSIS T9   Shoulder external rotation (seated) Lt ear, then can crawl to occiput T4   (Blank rows = not tested)  UPPER EXTREMITY MMT:  MMT Right Eval 04/08/2024 Left Eval 04/08/2024  Shoulder flexion (seated) 3+/5 5/5  Shoulder extension    Shoulder abduction (seated) 3+/5 5/5  Shoulder adduction     Shoulder internal rotation (seated) 5/5 5/5  Shoulder external rotation (seated) 3+/5 4/5  Middle trapezius    Lower trapezius    Elbow flexion    Elbow extension    Wrist flexion    Wrist extension    Wrist ulnar deviation    Wrist radial deviation    Wrist pronation    Wrist supination    Grip strength (lbs)    (Blank rows = not tested)  PALPATION:  Not overly TTP in generalized Rt shoulder area                                                                                                                              TREATMENT DATE: R shoulder 04/23/24 There Ex UBE 2 min each way no resistance Shoulder flexion with bar 2x10 There Act Doorway Rt ER stretch 10 x 10 sec hold Standing IR stretch 10 x 10 sec hold with strap Wall ladder with 2# wrist weight 1x15 in flexion, then 1x7 scaption/abduction Supine serratus punches 2# DB 2x10 Neuro TB Rows 3x10 Blue TB ext 3x10 b   04/21/24 There Ex Pulleys 3 min flexion, 3 min abduction  There Act Doorway Rt ER stretch 10 x 10 sec hold Standing IR stretch 10 x 10 sec  hold with strap Wall ladder with 2# wrist weight 1x15 in flexion, then 1x7 scaption/abduction Supine serratus punches 2# DB 2x10 Neuro TB Rows 3x10 Blue TB ext 3x10 blue  04/17/2024 TherAct: Pulleys 3 min flexion, 3 min abduction  Wall ladder with 1.5# wrist weight 1x15 in flexion, then 1x7 scaption/abduction Standing extension stretch with PVC 1x10 with 5s holds  Standing IR stretch with strap 1x10 with 10s holds  Doorway ER stretch 1x10 with 10s holds  Scaption   Neuro Re-Ed:  Standing rows with 35# on cable machine 2x12 with 3s holds  Supine D1 flexion 1x10 with red TB (highly difficult), 2x10 with 2# weight with improved performance  Supine D2 flexion 1x10 with 2# weight    04/14/24 TherAct Pulleys x 3 min each flexion and abduction Standing IR stretch 10 x 10 sec hold with strap Wall ladder with 2# wrist weight and holds at end range x 10 reps;  flexion and scaption Rows L4 band 2x10; 5 sec hold Shoulder ext L4 band 2x10; 5 sec hold Rt IR with L4 band 2x10; with ER stretch between sets Doorway Rt ER stretch 10 x 10 sec hold  PATIENT EDUCATION: Education details: HEP, POC Person educated: Patient Education method: Explanation, Demonstration, Tactile cues, Verbal cues, and Handouts Education comprehension: verbalized understanding, returned demonstration, verbal cues required, and tactile cues required  HOME EXERCISE PROGRAM: Access Code: [email or web address] URL: [email or web address] Date: 04/08/2024 Prepared by: Susannah Daring  Exercises - Supine Shoulder Flexion with Dowel  - 1 x daily - 7 x weekly - 2 sets - 10 reps - 8-10sec hold - Supine Shoulder External Rotation with Dowel  - 1 x daily - 7 x weekly - 2 sets - 10 reps - 8-10sec hold - Supine Shoulder Abduction AAROM with Dowel  - 1 x daily - 7 x weekly - 2 sets - 10 reps - 8-10sec hold - Standing Shoulder Row with Anchored Resistance  - 1 x daily - 7 x weekly - 2 sets - 10 reps - 3sec hold - Seated Scapular Retraction  - 1 x daily - 7 x weekly - 2 sets - 10 reps - 3sec hold  ASSESSMENT:  CLINICAL IMPRESSION: Patient needed tactile cues for keeping R elbow by side for true ER motion.  Adjustments madeto get some ER, arm at 45 abd.  OBJECTIVE IMPAIRMENTS: decreased coordination, decreased ROM, decreased strength, impaired flexibility, improper body mechanics, postural dysfunction, and pain.   ACTIVITY LIMITATIONS: lifting, bathing, reach over head, and hygiene/grooming  PARTICIPATION LIMITATIONS: cleaning, community activity, and yard work  PERSONAL FACTORS: Past/current experiences, Time since onset of injury/illness/exacerbation, and 3+ comorbidities: arthritis, cancer, HLD, hypothyroidism are also affecting patient's functional outcome.   REHAB POTENTIAL: Good  CLINICAL DECISION MAKING: Stable/uncomplicated  EVALUATION COMPLEXITY: Low  GOALS: Goals  reviewed with patient? Yes  SHORT TERM GOALS: Target date: 04/29/2024  Patient will show compliance with initial HEP. Baseline: Goal status: MET  2.  Patient will increase Rt shoulder abduction to at least 120deg in order to improve functional mobility.  Baseline:  Goal status: INITIAL    LONG TERM GOALS: Target date: 06/03/2024  Patient will be independent with final HEP in order to maintain and progress upon functional gains made within PT. Baseline:  Goal status: INITIAL  2.   Patient will increase PSFS to at least 5.67 in order to show a significant improvement in subjective disability rating. Baseline:  Goal status: INITIAL  3. Patient will increase Rt shoulder abduction and flexion to at  least 140deg in order to improve functional mobility.  Baseline:  Goal status: INITIAL  4.  Patient will increase Rt shoulder flexion and abduction MMT to at least 4/5 in order to improve biomechanics with functional mobility.  Baseline:  Goal status: INITIAL    PLAN: PT FREQUENCY: 1-2x/week  PT DURATION: 8 weeks  PLANNED INTERVENTIONS: [id number]- PT Re-evaluation, 97750- Physical Performance Testing, 97110-Therapeutic exercises, 97530- Therapeutic activity, [id number]- Neuromuscular re-education, 97535- Self Care, [id number]- Manual therapy, [id number]- Gait training, [phone]- Orthotic Initial, [phone]- Orthotic/Prosthetic subsequent, [phone]- Canalith repositioning, [phone]- Aquatic Therapy, [phone]- Electrical stimulation (unattended), [phone]- Electrical stimulation (manual), [id number]- Vasopneumatic device, [id number]- Ultrasound, [id number]- Traction (mechanical), [id number]- Ionotophoresis 4mg /ml Dexamethasone , [id number] (1-2 muscles), [id number] (3+ muscles)- Dry Needling, Patient/Family education, Balance training, Stair training, Taping, Joint mobilization, Joint manipulation, Spinal manipulation, Spinal mobilization, Vestibular training, DME instructions, Cryotherapy, and Moist heat  PLAN FOR NEXT SESSION:  continue ROM focus with  strengthening as able,  postural strengthening, Rt shoulder ROM and strengthening   Burnard Meth, PT 04/23/24  11:37 AM     "

## 2024-04-23 ENCOUNTER — Telehealth: Payer: Self-pay

## 2024-04-23 ENCOUNTER — Ambulatory Visit

## 2024-04-23 DIAGNOSIS — M25611 Stiffness of right shoulder, not elsewhere classified: Secondary | ICD-10-CM

## 2024-04-23 DIAGNOSIS — M25511 Pain in right shoulder: Secondary | ICD-10-CM

## 2024-04-23 DIAGNOSIS — M6281 Muscle weakness (generalized): Secondary | ICD-10-CM | POA: Diagnosis not present

## 2024-04-23 DIAGNOSIS — G8929 Other chronic pain: Secondary | ICD-10-CM | POA: Diagnosis not present

## 2024-04-23 DIAGNOSIS — R293 Abnormal posture: Secondary | ICD-10-CM

## 2024-04-23 NOTE — Telephone Encounter (Signed)
 Patient called about his testosterone  refill.  Advised him we received the request 1/21 and filled it 1/22.

## 2024-04-27 ENCOUNTER — Encounter

## 2024-04-29 ENCOUNTER — Ambulatory Visit

## 2024-04-29 DIAGNOSIS — M25511 Pain in right shoulder: Secondary | ICD-10-CM | POA: Diagnosis not present

## 2024-04-29 DIAGNOSIS — G8929 Other chronic pain: Secondary | ICD-10-CM | POA: Diagnosis not present

## 2024-04-29 DIAGNOSIS — R293 Abnormal posture: Secondary | ICD-10-CM | POA: Diagnosis not present

## 2024-04-29 DIAGNOSIS — M6281 Muscle weakness (generalized): Secondary | ICD-10-CM

## 2024-04-29 DIAGNOSIS — M25611 Stiffness of right shoulder, not elsewhere classified: Secondary | ICD-10-CM

## 2024-04-29 NOTE — Progress Notes (Unsigned)
 [facility] OFFICE PROGRESS NOTE  Marc [name], Marc [name], [name] 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 Devere on January 05, 2020.  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  The final pathological staging was T3aN0 grade 2 tumor.   2) Status post radiation therapy to the pulmonary nodules completed March 2023.  He received 50 Gray in 5 fractions. 3) Status post cryoablation to 2 lesions in the right kidney completed on Aug 23, 2021. 4) status post endoscopic transsphenoidal pituitary resection completed by Dr. Mable on November 08, 2021.  The final pathology showed clear-cell renal cell carcinoma. 5) SRS treatment to pituitary bed completed in October 2023.  He received 5 fractions for a total of 25 Gray  CURRENT THERAPY: Keytruda  200 Mg IV every 3 weeks started Aug 04, 2021 status post 42 cycles with axitinib  5 mg p.o. daily started February 09, 2022.   INTERVAL HISTORY: Marc [name] 69 y.o. male returns to the clinic today for a follow-up visit. He is feeling fairly well today without any concerning complaints. He is currently on treatment with Keytruda  every 3 weeks and Axitinib  p.o. daily.  The patient is tolerating treatment well without any concerning adverse side effects except for diarrhea but he controls this with imodium and denies any new concerns with regard to this.   He has no fevers, chills, or night sweats.  unexplained weight loss. He has been trying to be active with walking every day. He denies any appetite changes. He denies any rashes or skin changes. Denies any chest pain, shortness of breath, cough, or hemoptysis.   Denies any nausea or vomiting. Denies any dysuria, malodorous urine, urinary frequency or urgency.  Denies any headache or visual changes.  He is followed by Dr. Buckley and IR. His next brain MRI is scheduled for 08/06/24. He sees endocrinology for adrenal insuffiencey. He is here for evaluation and repeat blood work before undergoing cycle #43.        MEDICAL HISTORY: Past Medical History:  Diagnosis Date   Arthritis    Cancer (HCC)    Kidney, Lung, Pituitary   Hyperlipidemia    Hypothyroidism     ALLERGIES:  has no known allergies.  MEDICATIONS:  Current Outpatient Medications  Medication Sig Dispense Refill   Ascorbic Acid  (VITAMIN C PO) Take 1 tablet by mouth every evening.     aspirin  EC 81 MG tablet Take 81 mg by mouth every evening. Swallow whole.     azithromycin  (ZITHROMAX ) 250 MG tablet Take 1 tab daily 3 each 0   cholecalciferol  (VITAMIN D3) 25 MCG (1000 UNIT) tablet Take 1,000 Units by mouth every evening.     Ferrous Sulfate  (IRON SLOW RELEASE PO) Take 1 tablet by mouth every evening.     INLYTA  5 MG tablet TAKE 1 TABLET BY MOUTH DAILY 30 tablet 3   levothyroxine  (SYNTHROID ) 88 MCG tablet Take 1 tablet (88 mcg total) by mouth daily. 90 tablet 3   Multiple Vitamins-Minerals (MULTIVITAMIN WITH MINERALS) tablet Take 1 tablet by mouth every evening.     Omega-3 Fatty Acids (FISH OIL PO) Take 1 capsule by mouth every evening.     predniSONE  (DELTASONE )  5 MG tablet TAKE 1 AND 1/2 TABLETS(7.5 MG) BY MOUTH DAILY WITH BREAKFAST. MAY DOUBLE UP DURING SICK DAYS 150 tablet 3   prochlorperazine  (COMPAZINE ) 10 MG tablet Take 1 tablet (10 mg total) by mouth every 6 (six) hours as needed for nausea or vomiting. 30 tablet 0   rosuvastatin  (CRESTOR ) 40 MG tablet Take 1 tablet (40 mg total) by mouth daily. 90 tablet 3   Testosterone  20.25 MG/ACT (1.62%) GEL PLACE 3 PUMPS ONTO THE SKIN AS DIRECTED 150 g 5   vitamin E  180 MG (400 UNITS) capsule Take 400 Units by mouth every evening.     No  current facility-administered medications for this visit.    SURGICAL HISTORY:  Past Surgical History:  Procedure Laterality Date   CRANIOTOMY N/A 11/08/2021   Procedure: ENDOSCOPIC ENDONASAL RESECTION OF SELLAR MASS;  Surgeon: Cheryle Debby LABOR, MD;  Location: [facility] OR;  Service: Neurosurgery;  Laterality: N/A;  RM 20   IR RADIOLOGIST EVAL & MGMT  07/14/2021   IR RADIOLOGIST EVAL & MGMT  07/27/2021   IR RADIOLOGIST EVAL & MGMT  01/30/2022   IR RADIOLOGIST EVAL & MGMT  05/08/2023   IR RADIOLOGIST EVAL & MGMT  11/13/2023   RADIOFREQUENCY ABLATION N/A 08/23/2021   Procedure: RENAL CRYO ABLATION;  Surgeon: Vanice Sharper, MD;  Location: [facility] ORS;  Service: Anesthesiology;  Laterality: N/A;   right shoulder rotator cuff repair Right    ROBOT ASSISTED LAPAROSCOPIC NEPHRECTOMY Left 01/05/2020   Procedure: XI ROBOTIC ASSISTED LAPAROSCOPIC RADICAL NEPHRECTOMY;  Surgeon: Devere Lonni Righter, MD;  Location: [facility];  Service: Urology;  Laterality: Left;   TOTAL HIP ARTHROPLASTY Left 03/31/2020   Procedure: LEFT TOTAL HIP ARTHROPLASTY ANTERIOR APPROACH;  Surgeon: Vernetta Lonni GRADE, MD;  Location: [facility] OR;  Service: Orthopedics;  Laterality: Left;   TRANSPHENOIDAL APPROACH EXPOSURE N/A 11/08/2021   Procedure: TRANSPHENOIDAL APPROACH EXPOSURE;  Surgeon: Mable Lenis, MD;  Location[facility] OR;  Service: ENT;  Laterality: N/A;    REVIEW OF SYSTEMS:   Review of Systems  Constitutional: Negative for appetite change, chills, fatigue, fever and unexpected weight change.  HENT:   Negative for mouth sores, nosebleeds, sore throat and trouble swallowing.   Eyes: Negative for eye problems and icterus.  Respiratory: Negative for cough, hemoptysis, shortness of breath and wheezing.   Cardiovascular: Negative for chest pain and leg swelling.  Gastrointestinal: Negative for abdominal pain, constipation, diarrhea, nausea and vomiting.  Genitourinary: Negative for bladder incontinence, difficulty urinating, dysuria,  frequency and hematuria.   Musculoskeletal: Negative for back pain, gait problem, neck pain and neck stiffness.  Skin: Negative for itching and rash.  Neurological: Negative for dizziness, extremity weakness, gait problem, headaches, light-headedness and seizures.  Hematological: Negative for adenopathy. Does not bruise/bleed easily.  Psychiatric/Behavioral: Negative for confusion, depression and sleep disturbance. The patient is not nervous/anxious.     PHYSICAL EXAMINATION:  There were no vitals taken for this visit.  ECOG PERFORMANCE STATUS: {CHL ONC ECOG [id number]  Physical Exam  Constitutional: Oriented to person, place, and time and well-developed, well-nourished, and in no distress. No distress.  HENT:  Head: Normocephalic and atraumatic.  Mouth/Throat: Oropharynx is clear and moist. No oropharyngeal exudate.  Eyes: Conjunctivae are normal. Right eye exhibits no discharge. Left eye exhibits no discharge. No scleral icterus.  Neck: Normal range of motion. Neck supple.  Cardiovascular: Normal rate, regular rhythm, normal heart sounds and intact distal pulses.   Pulmonary/Chest: Effort normal and breath sounds normal. No respiratory distress. No wheezes. No rales.  Abdominal: Soft. Bowel sounds are normal. Exhibits no distension and no mass. There is no tenderness.  Musculoskeletal: Normal range of motion. Exhibits no edema.  Lymphadenopathy:    No cervical adenopathy.  Neurological: Alert and oriented to person, place, and time. Exhibits normal muscle tone. Gait normal. Coordination normal.  Skin: Skin is warm and dry. No rash noted. Not diaphoretic. No erythema. No pallor.  Psychiatric: Mood, memory and judgment normal.  Vitals reviewed.  LABORATORY DATA: Lab Results  Component Value Date   WBC 11.6 (H) 04/15/2024   HGB 14.6 04/15/2024   HCT 43.7 04/15/2024   MCV 90.3 04/15/2024   PLT 242 04/15/2024      Chemistry      Component Value Date/Time   NA 141  04/15/2024 0850   NA 140 08/09/2022 1014   K 4.0 04/15/2024 0850   CL 105 04/15/2024 0850   CO2 22 04/15/2024 0850   BUN 15 04/15/2024 0850   BUN 18 08/09/2022 1014   CREATININE 1.04 04/15/2024 0850      Component Value Date/Time   CALCIUM  9.3 04/15/2024 0850   ALKPHOS 71 04/15/2024 0850   AST 56 (H) 04/15/2024 0850   ALT 100 (H) 04/15/2024 0850   BILITOT 0.4 04/15/2024 0850       RADIOGRAPHIC STUDIES:  DG Chest 2 View Result Date: 04/16/2024 CLINICAL DATA:  History of renal cell carcinoma. Left lower lobe pneumonia follow-up. EXAM: CHEST - 2 VIEW COMPARISON:  03/04/2024 FINDINGS: Removal of left IJ central line. Midline trachea. Normal heart size and mediastinal contours. No pleural effusion or pneumothorax. Right perihilar and lung base scarring. IMPRESSION: No acute cardiopulmonary disease. Electronically Signed   By: Rockey Kilts M.D.   On: 04/16/2024 14:01   XR Shoulder Right Result Date: 03/30/2024 Right shoulder 3 views: Shoulder is well located.  Glenohumeral joint is well-maintained.  Calcific changes about the rotator cuff.  No acute fractures.  There is some slight irregularity to the cortices of the proximal humeral shaft felt most likely represents the insertion of the deltoid.  No other bony abnormalities.  No acute fracture.    ASSESSMENT/PLAN:  This is a very pleasant 69 year old male diagnosed with renal cell carcinoma.  He was initially diagnosed and 2021.  He was found to have metastatic disease with pulmonary involvement and pituitary metastasis and 2023   The patient underwent robot-assisted laparoscopic left radical nephrectomy by Dr. Devere on 01/05/2020 that showed a 10.7 cm lesion with extension into the perinephric tissue 0 of 4 lymph nodes involved.  The final pathology was T3a, N0, grade 2 tumor.   He then underwent radiation therapy to the pulmonary nodules which was completed in March 2023.   He then underwent cryoablation to 2 lesions in the right  kidney which was performed on 08/23/2021   He then underwent SRS treatment to the pituitary bed which was completed in October 2023.   he patient is currently undergoing pembrolizumab  200 mg IV every 3 weeks which is started on 08/04/2021. He is status post 37 cycles.  He is also on Axitnib 5 mg daily which was started in February 09, 2022.    Labs were reviewed.  Recommend that he proceed with cycle #43  today as scheduled.    We will see him back for follow-up visit in 3 weeks for evaluation repeat blood work before undergoing cycle #44    He will continue to use imodium for diarrhea.    His next brain MRI is scheduled for  08/06/24  The patient was advised to call immediately if she has any concerning symptoms in the interval. The patient voices understanding of current disease status and treatment options and is in agreement with the current care plan. All questions were answered. The patient knows to call the clinic with any problems, questions or concerns. We can certainly see the patient much sooner if necessary    No orders of the defined types were placed in this encounter.    I spent {CHL ONC TIME VISIT - DTPQU:[id number]} counseling the patient face to face. The total time spent in the appointment was {CHL ONC TIME VISIT - DTPQU:[id number]}.  Mayra Brahm L Tashi Band, PA-C 04/29/24

## 2024-04-29 NOTE — Therapy (Signed)
 " OUTPATIENT PHYSICAL THERAPY UPPER EXTREMITY TREATMENT   Patient Name: Marc [name] MRN: [id number] DOB:[birth year], 69 y.o., male Today's Date: 04/29/2024  END OF SESSION:  PT End of Session - 04/29/24 1045     Visit Number 7    Number of Visits 16    Date for Recertification  06/03/24    Authorization Type MEDICARE    PT Start Time 1015    PT Stop Time 1045    PT Time Calculation (min) 30 min    Activity Tolerance Patient tolerated treatment well    Behavior During Therapy WFL for tasks assessed/performed                Past Medical History:  Diagnosis Date   Arthritis    Cancer (HCC)    Kidney, Lung, Pituitary   Hyperlipidemia    Hypothyroidism    Past Surgical History:  Procedure Laterality Date   CRANIOTOMY N/A 11/08/2021   Procedure: ENDOSCOPIC ENDONASAL RESECTION OF SELLAR MASS;  Surgeon: Cheryle Debby LABOR, MD;  Location: [facility] OR;  Service: Neurosurgery;  Laterality: N/A;  RM 20   IR RADIOLOGIST EVAL & MGMT  07/14/2021   IR RADIOLOGIST EVAL & MGMT  07/27/2021   IR RADIOLOGIST EVAL & MGMT  01/30/2022   IR RADIOLOGIST EVAL & MGMT  05/08/2023   IR RADIOLOGIST EVAL & MGMT  11/13/2023   RADIOFREQUENCY ABLATION N/A 08/23/2021   Procedure: RENAL CRYO ABLATION;  Surgeon: Vanice Sharper, MD;  Location: [facility];  Service: Anesthesiology;  Laterality: N/A;   right shoulder rotator cuff repair Right    ROBOT ASSISTED LAPAROSCOPIC NEPHRECTOMY Left 01/05/2020   Procedure: XI ROBOTIC ASSISTED LAPAROSCOPIC RADICAL NEPHRECTOMY;  Surgeon: Devere Lonni Righter, MD;  Location: [facility];  Service: Urology;  Laterality: Left;   TOTAL HIP ARTHROPLASTY Left 03/31/2020   Procedure: LEFT TOTAL HIP ARTHROPLASTY ANTERIOR APPROACH;  Surgeon: Vernetta Lonni GRADE, MD;  Location: [facility] OR;  Service: Orthopedics;  Laterality: Left;   TRANSPHENOIDAL APPROACH EXPOSURE N/A 11/08/2021   Procedure: TRANSPHENOIDAL APPROACH EXPOSURE;  Surgeon: Mable Lenis, MD;  Location: [facility] OR;  Service: ENT;   Laterality: N/A;   Patient Active Problem List   Diagnosis Date Noted   Left lower lobe pneumonia 03/06/2024   Hyperglycemia 03/06/2024   Acute respiratory failure with hypoxia (HCC) 03/06/2024   AKI (acute kidney injury) 03/06/2024   Hypothyroidism 03/06/2024   Hyperlipidemia 03/06/2024   Septic shock (HCC) 03/04/2024   Encounter for antineoplastic immunotherapy 05/01/2022   Status post transsphenoidal pituitary resection 11/08/2021   Metastasis to Pituitary (HCC) 11/08/2021   Secondary adrenal insufficiency 06/19/2021   Low testosterone  in male 06/19/2021   Pituitary macroadenoma (HCC) 06/19/2021   Coronary artery disease 06/13/2021   Gallstones 06/13/2021   Hardening of the aorta (main artery of the heart) 06/13/2021   History of hypercholesterolemia 06/13/2021   Low testosterone  06/13/2021   Osteoarthritis 06/13/2021   Other specified nonpsychotic mental disorders 06/13/2021   History of colonic polyps 06/13/2021   Personal history of other malignant neoplasm of kidney 06/13/2021   Pure hypercholesterolemia 06/13/2021   Tobacco use 06/13/2021   Secondary renal cell carcinoma of lung, right (HCC) 06/13/2021   Renal cell carcinoma of right kidney (HCC) 06/13/2021   Kidney cancer, primary, with metastasis from kidney to other site [facility]) 06/01/2021   Unilateral primary osteoarthritis, left knee 06/09/2020   Unilateral primary osteoarthritis, right knee 06/09/2020   Status post total replacement of left hip 03/31/2020   Unilateral primary osteoarthritis, left hip 01/28/2020  Renal mass 01/05/2020    PCP: Sydelle Close, PA  REFERRING PROVIDER: Bertrum Gaskins, PA-C  REFERRING DIAG: M25.511 (ICD-10-CM) - Acute pain of right shoulder  THERAPY DIAG:  Chronic right shoulder pain  Stiffness of right shoulder, not elsewhere classified  Muscle weakness (generalized)  Abnormal posture  Rationale for Evaluation and Treatment: Rehabilitation  ONSET DATE: chronic, though had  a fall 4-5 months ago that triggered mobility issues   SUBJECTIVE:                                                                                                                                                                                      SUBJECTIVE STATEMENT: Patient reports some tightness in the shoulder but mild. Hand dominance: Right  PERTINENT HISTORY: Patient reports that he tore his rotator cuff 10-15 years ago and underwent a repair that has led to some chronic pain and mobility deficits (not severe). Patient does report that he had a fall 4-5 months ago where he tripped over a grapevine while in the woods and landed on the Rt shoulder. Patient endorses no other injuries, surgeries, or trauma to the area or surrounding joints. Patient is currently in active cancer treatment several times per week and is on current prescription of daily prednisone  (7mg ).  See PMH or personal factors for in depth comorbidities   PAIN:  Are you having pain? Yes: NPRS scale: no pain, just stiffness Pain location: general Rt shoulder  Pain description: dull  Aggravating factors:   Relieving factors: stretches   PRECAUTIONS: Fall  RED FLAGS: Bowel or bladder incontinence: No and Cauda equina syndrome: No   WEIGHT BEARING RESTRICTIONS: No  FALLS:  Has patient fallen in last 6 months? Yes. 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  DIAGNOSTIC FINDINGS:  Right shoulder 3 views: Shoulder is well located.  Glenohumeral joint is  well-maintained.  Calcific changes about the rotator cuff.  No acute  fractures.  There is some slight irregularity to the cortices of the  proximal humeral shaft felt most likely represents the  insertion of the  deltoid.  No other bony abnormalities.  No acute fracture.   PATIENT SURVEYS :  PSFS: THE PATIENT SPECIFIC FUNCTIONAL SCALE  Place score of 0-10 (0 = unable to perform activity and 10 = able to perform activity at the same level as before injury or problem)  Activity Date: 04/08/2024    Lifting Rt arm  3    2. Lifting over 2 pounds  3    3. Washing hair without needing assistance  5    4.      Total Score 3.67      Total Score = Sum of activity scores/number of activities  Minimally Detectable Change: 3 points (for single activity); 2 points (for average score)  [facility] (nd). The Patient Specific Functional Scale . Retrieved from [email or web address].pt   COGNITION: Overall cognitive status: Within functional limits for tasks assessed     SENSATION: Not tested Patient subjectively endorses no n/t down the arm   POSTURE: Rounded shoulders, forward head, increased thoracic kyphosis   UPPER EXTREMITY ROM:   Active ROM Right Eval 04/08/2024 Left Eval 04/08/2024 Right 04/17/24   Shoulder flexion (seated) 120deg WFL 122 (standing)  Shoulder extension     Shoulder abduction (seated) 106deg  WFL  119 (standing)  Shoulder adduction     Shoulder internal rotation (seated) PSIS T9   Shoulder external rotation (seated) Lt ear, then can crawl to occiput T4   (Blank rows = not tested)  UPPER EXTREMITY MMT:  MMT Right Eval 04/08/2024 Left Eval 04/08/2024  Shoulder flexion (seated) 3+/5 5/5  Shoulder extension    Shoulder abduction (seated) 3+/5 5/5  Shoulder  adduction    Shoulder internal rotation (seated) 5/5 5/5  Shoulder external rotation (seated) 3+/5 4/5  Middle trapezius    Lower trapezius    Elbow flexion    Elbow extension    Wrist flexion    Wrist extension    Wrist ulnar deviation    Wrist radial deviation    Wrist pronation    Wrist supination    Grip strength (lbs)    (Blank rows = not tested)  PALPATION:  Not overly TTP in generalized Rt shoulder area                                                                                                                              TREATMENT DATE: R shoulder 04/29/24 There Ex UBE 2 min each way no resistance Shoulder flexion with bar 2x10 There Act Doorway Rt ER stretch 10 x 10 sec hold Standing IR stretch 10 x 10 sec hold with strap Wall ladder with 2.5# wrist weight 1x15 in flexion, then 1x7 scaption/abduction Supine serratus punches 3# DB 2x10 Neuro TB Rows 3x10 Blue TB ext 3x10 Blue   04/23/24 There Ex UBE 2 min each way no resistance Shoulder flexion with bar 2x10 There Act Doorway Rt ER stretch 10 x 10 sec hold Standing IR  stretch 10 x 10 sec hold with strap Wall ladder with 2# wrist weight 1x15 in flexion, then 1x7 scaption/abduction Supine serratus punches 2# DB 2x10 Neuro TB Rows 3x10 Blue TB ext 3x10 b   04/21/24 There Ex Pulleys 3 min flexion, 3 min abduction  There Act Doorway Rt ER stretch 10 x 10 sec hold Standing IR stretch 10 x 10 sec hold with strap Wall ladder with 2# wrist weight 1x15 in flexion, then 1x7 scaption/abduction Supine serratus punches 2# DB 2x10 Neuro TB Rows 3x10 Blue TB ext 3x10 blue  PATIENT EDUCATION: Education details: HEP, POC Person educated: Patient Education method: Explanation, Demonstration, Tactile cues, Verbal cues, and Handouts Education comprehension: verbalized understanding, returned demonstration, verbal cues required, and tactile cues required  HOME EXERCISE PROGRAM: Access Code: [email or web address] URL:  [email or web address] Date: 04/08/2024 Prepared by: Susannah Daring  Exercises - Supine Shoulder Flexion with Dowel  - 1 x daily - 7 x weekly - 2 sets - 10 reps - 8-10sec hold - Supine Shoulder External Rotation with Dowel  - 1 x daily - 7 x weekly - 2 sets - 10 reps - 8-10sec hold - Supine Shoulder Abduction AAROM with Dowel  - 1 x daily - 7 x weekly - 2 sets - 10 reps - 8-10sec hold - Standing Shoulder Row with Anchored Resistance  - 1 x daily - 7 x weekly - 2 sets - 10 reps - 3sec hold - Seated Scapular Retraction  - 1 x daily - 7 x weekly - 2 sets - 10 reps - 3sec hold  ASSESSMENT:  CLINICAL IMPRESSION: Patient able to tolerate increased resistance with exercises demonstrating increased strength.  OBJECTIVE IMPAIRMENTS: decreased coordination, decreased ROM, decreased strength, impaired flexibility, improper body mechanics, postural dysfunction, and pain.   ACTIVITY LIMITATIONS: lifting, bathing, reach over head, and hygiene/grooming  PARTICIPATION LIMITATIONS: cleaning, community activity, and yard work  PERSONAL FACTORS: Past/current experiences, Time since onset of injury/illness/exacerbation, and 3+ comorbidities: arthritis, cancer, HLD, hypothyroidism are also affecting patient's functional outcome.   REHAB POTENTIAL: Good  CLINICAL DECISION MAKING: Stable/uncomplicated  EVALUATION COMPLEXITY: Low  GOALS: Goals reviewed with patient? Yes  SHORT TERM GOALS: Target date: 04/29/2024  Patient will show compliance with initial HEP. Baseline: Goal status: MET  2.  Patient will increase Rt shoulder abduction to at least 120deg in order to improve functional mobility.  Baseline:  Goal status: INITIAL    LONG TERM GOALS: Target date: 06/03/2024  Patient will be independent with final HEP in order to maintain and progress upon functional gains made within PT. Baseline:  Goal status: INITIAL  2.   Patient will increase PSFS to at least 5.67 in order to show a  significant improvement in subjective disability rating. Baseline:  Goal status: INITIAL  3. Patient will increase Rt shoulder abduction and flexion to at least 140deg in order to improve functional mobility.  Baseline:  Goal status: INITIAL  4.  Patient will increase Rt shoulder flexion and abduction MMT to at least 4/5 in order to improve biomechanics with functional mobility.  Baseline:  Goal status: INITIAL    PLAN: PT FREQUENCY: 1-2x/week  PT DURATION: 8 weeks  PLANNED INTERVENTIONS: [id number]- PT Re-evaluation, 97750- Physical Performance Testing, 97110-Therapeutic exercises, 97530- Therapeutic activity, [id number]- Neuromuscular re-education, 97535- Self Care, [id number]- Manual therapy, [id number]- Gait training, [id number]- Orthotic Initial, [id number]- Orthotic/Prosthetic subsequent, [phone]- Canalith repositioning, [id number]- Aquatic Therapy, H9716- Electrical stimulation (unattended), [id number]- Electrical stimulation (manual), [id number]- Vasopneumatic device, [id number]- Ultrasound, [id number]- Traction (mechanical), [id number]- Ionotophoresis 4mg /ml Dexamethasone ,  [id number] (1-2 muscles), [id number] (3+ muscles)- Dry Needling, Patient/Family education, Balance training, Stair training, Taping, Joint mobilization, Joint manipulation, Spinal manipulation, Spinal mobilization, Vestibular training, DME instructions, Cryotherapy, and Moist heat  PLAN FOR NEXT SESSION:  continue ROM focus with strengthening as able,  postural strengthening, Rt shoulder ROM and strengthening   Burnard Meth, PT 04/29/24  10:48 AM     "

## 2024-05-04 ENCOUNTER — Inpatient Hospital Stay: Attending: Oncology | Admitting: Physician Assistant

## 2024-05-04 ENCOUNTER — Encounter

## 2024-05-04 DIAGNOSIS — C642 Malignant neoplasm of left kidney, except renal pelvis: Secondary | ICD-10-CM

## 2024-05-04 NOTE — Progress Notes (Signed)
 [facility] HEMATOLOGY-ONCOLOGY TeleHEALTH VISIT PROGRESS NOTE   I connected with Marc [name] on 05/04/24 at 12:00 PM EST by telephone and verified that I am speaking with the correct person using two identifiers.  I discussed the limitations, risks, security and privacy concerns of performing an evaluation and management service by telemedicine and the availability of in-person appointments. I also discussed with the patient that there may be a patient responsible charge related to this service. The patient expressed understanding and agreed to proceed.  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 Devere on January 05, 2020.  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  The final pathological staging was T3aN0 grade 2 tumor.   2) Status post radiation therapy to the pulmonary nodules completed March 2023.  He received 50 Gray in 5 fractions. 3) Status post cryoablation to 2 lesions in the right kidney completed on Aug 23, 2021. 4) status post endoscopic transsphenoidal pituitary resection completed by Dr. Mable on November 08, 2021.  The final pathology showed clear-cell renal cell carcinoma. 5) SRS treatment to pituitary bed completed in October 2023.  He received 5 fractions for a total of 25 Gray  CURRENT THERAPY: Keytruda  200 Mg IV every 3 weeks started Aug 04, 2021 status post 42 cycles with axitinib  5 mg p.o. daily started February 09, 2022.   INTERVAL HISTORY: Marc [name] 69 y.o. male and I connected via telephone call today. He is feeling fairly  well today without any concerning complaints. He is currently on treatment with Keytruda  every 3 weeks and Axitinib  p.o. daily.  The patient is tolerating treatment well without any concerning adverse side effects. He used to get intermittent diarrhea but for some reason,after his hospitalization for pneumonia in December, his diarrhea has been improved.   His LFTs were slightly elevated the last few visits. He had a spike in his creatinine after his hospitalization because he was taking advil  due to knee pain. However, due to the spike in his kidney function, he switched to tylenol  then started having some elevated LFTs. He reports his knee pain is better so he is not taking either at this time.   He has no fevers, chills, or night sweats, or unexplained weight loss. He has been trying to be active with walking. He denies any appetite changes. He denies any rashes or skin changes. His shortness of breath is back to his baseline but it did take about 3-4 weeks from his pneumonia to improve.    Denies any chest pain, cough, or hemoptysis.  Denies any nausea or vomiting. Denies any dysuria, malodorous urine, urinary frequency or urgency.  Denies any headache or visual changes.  He is followed by Dr. Buckley and IR. His next brain MRI is scheduled for 08/06/24. He sees endocrinology for adrenal insuffiencey. He is here for evaluation and repeat blood work before undergoing cycle #43 tomorrow.     MEDICAL HISTORY: Past Medical History:  Diagnosis Date   Arthritis    Cancer (HCC)    Kidney, Lung, Pituitary   Hyperlipidemia    Hypothyroidism  ALLERGIES:  has no known allergies.  MEDICATIONS:  Current Outpatient Medications  Medication Sig Dispense Refill   Ascorbic Acid  (VITAMIN C PO) Take 1 tablet by mouth every evening.     aspirin  EC 81 MG tablet Take 81 mg by mouth every evening. Swallow whole.     azithromycin  (ZITHROMAX ) 250 MG tablet Take 1 tab daily 3 each 0   cholecalciferol  (VITAMIN  D3) 25 MCG (1000 UNIT) tablet Take 1,000 Units by mouth every evening.     Ferrous Sulfate  (IRON SLOW RELEASE PO) Take 1 tablet by mouth every evening.     INLYTA  5 MG tablet TAKE 1 TABLET BY MOUTH DAILY 30 tablet 3   levothyroxine  (SYNTHROID ) 88 MCG tablet Take 1 tablet (88 mcg total) by mouth daily. 90 tablet 3   Multiple Vitamins-Minerals (MULTIVITAMIN WITH MINERALS) tablet Take 1 tablet by mouth every evening.     Omega-3 Fatty Acids (FISH OIL PO) Take 1 capsule by mouth every evening.     predniSONE  (DELTASONE ) 5 MG tablet TAKE 1 AND 1/2 TABLETS(7.5 MG) BY MOUTH DAILY WITH BREAKFAST. MAY DOUBLE UP DURING SICK DAYS 150 tablet 3   prochlorperazine  (COMPAZINE ) 10 MG tablet Take 1 tablet (10 mg total) by mouth every 6 (six) hours as needed for nausea or vomiting. 30 tablet 0   rosuvastatin  (CRESTOR ) 40 MG tablet Take 1 tablet (40 mg total) by mouth daily. 90 tablet 3   Testosterone  20.25 MG/ACT (1.62%) GEL PLACE 3 PUMPS ONTO THE SKIN AS DIRECTED 150 g 5   vitamin E  180 MG (400 UNITS) capsule Take 400 Units by mouth every evening.     No current facility-administered medications for this visit.    SURGICAL HISTORY:  Past Surgical History:  Procedure Laterality Date   CRANIOTOMY N/A 11/08/2021   Procedure: ENDOSCOPIC ENDONASAL RESECTION OF SELLAR MASS;  Surgeon: Cheryle Debby LABOR, MD;  Location: [facility] OR;  Service: Neurosurgery;  Laterality: N/A;  RM 20   IR RADIOLOGIST EVAL & MGMT  07/14/2021   IR RADIOLOGIST EVAL & MGMT  07/27/2021   IR RADIOLOGIST EVAL & MGMT  01/30/2022   IR RADIOLOGIST EVAL & MGMT  05/08/2023   IR RADIOLOGIST EVAL & MGMT  11/13/2023   RADIOFREQUENCY ABLATION N/A 08/23/2021   Procedure: RENAL CRYO ABLATION;  Surgeon: Vanice Sharper, MD;  Location: [facility] ORS;  Service: Anesthesiology;  Laterality: N/A;   right shoulder rotator cuff repair Right    ROBOT ASSISTED LAPAROSCOPIC NEPHRECTOMY Left 01/05/2020   Procedure: XI ROBOTIC ASSISTED LAPAROSCOPIC RADICAL NEPHRECTOMY;  Surgeon:  Devere Lonni Righter, MD;  Location: [facility] ORS;  Service: Urology;  Laterality: Left;   TOTAL HIP ARTHROPLASTY Left 03/31/2020   Procedure: LEFT TOTAL HIP ARTHROPLASTY ANTERIOR APPROACH;  Surgeon: Vernetta Lonni GRADE, MD;  Location: [facility] OR;  Service: Orthopedics;  Laterality: Left;   TRANSPHENOIDAL APPROACH EXPOSURE N/A 11/08/2021   Procedure: TRANSPHENOIDAL APPROACH EXPOSURE;  Surgeon: Mable Lenis, MD;  Location: [facility] OR;  Service: ENT;  Laterality: N/A;    REVIEW OF SYSTEMS:   Review of Systems  Constitutional: Negative for appetite change, chills, fatigue, fever and unexpected weight change.  HENT: Negative for mouth sores, nosebleeds, sore throat and trouble swallowing.   Eyes: Negative for eye problems and icterus.  Respiratory: Negative for cough, hemoptysis, shortness of breath and wheezing.   Cardiovascular: Negative for chest pain and leg swelling.  Gastrointestinal: Negative for abdominal pain, constipation, diarrhea (improved), nausea and vomiting.  Genitourinary: Negative for bladder incontinence, difficulty urinating, dysuria, frequency and hematuria.  Musculoskeletal: Negative for back pain, gait problem, neck pain and neck stiffness.  Skin: Negative for itching and rash.  Neurological: Negative for dizziness, extremity weakness, gait problem, headaches, light-headedness and seizures.  Hematological: Negative for adenopathy. Does not bruise/bleed easily.  Psychiatric/Behavioral: Negative for confusion, depression and sleep disturbance. The patient is not nervous/anxious.     PHYSICAL EXAMINATION:  There were no vitals taken for this visit.  ECOG PERFORMANCE STATUS: 1  Physical Exam  Constitutional: Oriented to person, place, and time Psychiatric: Mood, memory and judgment normal.  Vitals reviewed.  LABORATORY DATA: Lab Results  Component Value Date   WBC 11.6 (H) 04/15/2024   HGB 14.6 04/15/2024   HCT 43.7 04/15/2024   MCV 90.3 04/15/2024   PLT 242  04/15/2024      Chemistry      Component Value Date/Time   NA 141 04/15/2024 0850   NA 140 08/09/2022 1014   K 4.0 04/15/2024 0850   CL 105 04/15/2024 0850   CO2 22 04/15/2024 0850   BUN 15 04/15/2024 0850   BUN 18 08/09/2022 1014   CREATININE 1.04 04/15/2024 0850      Component Value Date/Time   CALCIUM  9.3 04/15/2024 0850   ALKPHOS 71 04/15/2024 0850   AST 56 (H) 04/15/2024 0850   ALT 100 (H) 04/15/2024 0850   BILITOT 0.4 04/15/2024 0850       RADIOGRAPHIC STUDIES:  DG Chest 2 View Result Date: 04/16/2024 CLINICAL DATA:  History of renal cell carcinoma. Left lower lobe pneumonia follow-up. EXAM: CHEST - 2 VIEW COMPARISON:  03/04/2024 FINDINGS: Removal of left IJ central line. Midline trachea. Normal heart size and mediastinal contours. No pleural effusion or pneumothorax. Right perihilar and lung base scarring. IMPRESSION: No acute cardiopulmonary disease. Electronically Signed   By: Rockey Kilts M.D.   On: 04/16/2024 14:01    ASSESSMENT/PLAN:  This is a very pleasant 69 year old male diagnosed with renal cell carcinoma.  He was initially diagnosed and 2021.  He was found to have metastatic disease with pulmonary involvement and pituitary metastasis and 2023   The patient underwent robot-assisted laparoscopic left radical nephrectomy by Dr. Devere on 01/05/2020 that showed a 10.7 cm lesion with extension into the perinephric tissue 0 of 4 lymph nodes involved.  The final pathology was T3a, N0, grade 2 tumor.   He then underwent radiation therapy to the pulmonary nodules which was completed in March 2023.   He then underwent cryoablation to 2 lesions in the right kidney which was performed on 08/23/2021   He then underwent SRS treatment to the pituitary bed which was completed in October 2023.   he patient is currently undergoing pembrolizumab  200 mg IV every 3 weeks which is started on 08/04/2021. He is status post 42 cycles.  He is also on Axitnib 5 mg daily which was started  in February 09, 2022.   As long as his labs are within parameters, he will proceed with treatment tomorrow. We will monitor his liver enzymes closely. If his liver enzymes are >100, then would need to reach out to me or Dr. Sherrod to review.   We may have delayed opening tomorrow morning. He understands he may get a call today if his infusion time needs to be rescheduled.    Labs were reviewed.  Recommend that he proceed with cycle #43  today as scheduled.    We will see him back for follow-up visit in 3 weeks for evaluation repeat blood work before undergoing cycle #44  His next brain MRI is scheduled for 08/06/24  I discussed the assessment and treatment plan with the patient. The patient was provided an opportunity to ask questions and all were answered. The patient agreed with the plan and demonstrated an understanding of the instructions.  The patient was advised to call back or seek an in-person evaluation if the symptoms worsen or if the condition fails to improve as anticipated.  I provided 20 minutes of non face-to-face telephone visit time during this encounter, and > 50% was spent counseling as documented under my assessment & plan.  Raianna Slight L Lexie Koehl, PA-C 05/04/2024 12:16 PM  No orders of the defined types were placed in this encounter.

## 2024-05-05 ENCOUNTER — Inpatient Hospital Stay: Attending: Oncology

## 2024-05-05 ENCOUNTER — Inpatient Hospital Stay

## 2024-05-05 ENCOUNTER — Inpatient Hospital Stay: Admitting: Physician Assistant

## 2024-05-05 VITALS — BP 121/89 | HR 76 | Temp 97.2°F | Resp 16 | Wt 231.8 lb

## 2024-05-05 DIAGNOSIS — C642 Malignant neoplasm of left kidney, except renal pelvis: Secondary | ICD-10-CM

## 2024-05-05 LAB — CBC WITH DIFFERENTIAL (CANCER CENTER ONLY)
Abs Immature Granulocytes: 0.03 10*3/uL (ref 0.00–0.07)
Basophils Absolute: 0 10*3/uL (ref 0.0–0.1)
Basophils Relative: 1 %
Eosinophils Absolute: 0.2 10*3/uL (ref 0.0–0.5)
Eosinophils Relative: 2 %
HCT: 43.1 % (ref 39.0–52.0)
Hemoglobin: 14.2 g/dL (ref 13.0–17.0)
Immature Granulocytes: 0 %
Lymphocytes Relative: 29 %
Lymphs Abs: 2.4 10*3/uL (ref 0.7–4.0)
MCH: 30.7 pg (ref 26.0–34.0)
MCHC: 32.9 g/dL (ref 30.0–36.0)
MCV: 93.1 fL (ref 80.0–100.0)
Monocytes Absolute: 0.7 10*3/uL (ref 0.1–1.0)
Monocytes Relative: 9 %
Neutro Abs: 4.8 10*3/uL (ref 1.7–7.7)
Neutrophils Relative %: 59 %
Platelet Count: 215 10*3/uL (ref 150–400)
RBC: 4.63 MIL/uL (ref 4.22–5.81)
RDW: 16.6 % — ABNORMAL HIGH (ref 11.5–15.5)
WBC Count: 8.1 10*3/uL (ref 4.0–10.5)
nRBC: 0.2 % (ref 0.0–0.2)

## 2024-05-05 LAB — CMP (CANCER CENTER ONLY)
ALT: 71 U/L — ABNORMAL HIGH (ref 0–44)
AST: 45 U/L — ABNORMAL HIGH (ref 15–41)
Albumin: 4.4 g/dL (ref 3.5–5.0)
Alkaline Phosphatase: 62 U/L (ref 38–126)
Anion gap: 12 (ref 5–15)
BUN: 11 mg/dL (ref 8–23)
CO2: 26 mmol/L (ref 22–32)
Calcium: 9.3 mg/dL (ref 8.9–10.3)
Chloride: 103 mmol/L (ref 98–111)
Creatinine: 1.14 mg/dL (ref 0.61–1.24)
GFR, Estimated: >60 mL/min
Glucose, Bld: 88 mg/dL (ref 70–99)
Potassium: 4 mmol/L (ref 3.5–5.1)
Sodium: 141 mmol/L (ref 135–145)
Total Bilirubin: 0.4 mg/dL (ref 0.0–1.2)
Total Protein: 7.1 g/dL (ref 6.5–8.1)

## 2024-05-05 LAB — TSH: TSH: <0.1 u[IU]/mL — ABNORMAL LOW (ref 0.350–4.500)

## 2024-05-05 MED ORDER — SODIUM CHLORIDE 0.9 % IV SOLN
200.0000 mg | Freq: Once | INTRAVENOUS | Status: AC
  Administered 2024-05-05: 200 mg via INTRAVENOUS
  Filled 2024-05-05: qty 200

## 2024-05-05 MED ORDER — SODIUM CHLORIDE 0.9 % IV SOLN: Freq: Once | INTRAVENOUS | Status: AC

## 2024-05-05 NOTE — Patient Instructions (Signed)

## 2024-05-06 ENCOUNTER — Ambulatory Visit

## 2024-05-06 DIAGNOSIS — G8929 Other chronic pain: Secondary | ICD-10-CM

## 2024-05-06 DIAGNOSIS — M25511 Pain in right shoulder: Secondary | ICD-10-CM | POA: Diagnosis not present

## 2024-05-06 DIAGNOSIS — R293 Abnormal posture: Secondary | ICD-10-CM

## 2024-05-06 DIAGNOSIS — M25611 Stiffness of right shoulder, not elsewhere classified: Secondary | ICD-10-CM | POA: Diagnosis not present

## 2024-05-06 DIAGNOSIS — M6281 Muscle weakness (generalized): Secondary | ICD-10-CM

## 2024-05-06 LAB — T4: T4, Total: 7.3 ug/dL (ref 4.5–12.0)

## 2024-05-07 NOTE — Progress Notes (Signed)
 I have yet to see the patient

## 2024-05-11 ENCOUNTER — Ambulatory Visit: Admitting: Physician Assistant

## 2024-05-27 ENCOUNTER — Inpatient Hospital Stay

## 2024-05-27 ENCOUNTER — Inpatient Hospital Stay: Admitting: Internal Medicine

## 2024-06-17 ENCOUNTER — Inpatient Hospital Stay: Attending: Oncology

## 2024-06-17 ENCOUNTER — Inpatient Hospital Stay: Admitting: Internal Medicine

## 2024-06-17 ENCOUNTER — Inpatient Hospital Stay

## 2024-06-24 ENCOUNTER — Ambulatory Visit: Admitting: Internal Medicine

## 2024-07-08 ENCOUNTER — Inpatient Hospital Stay: Attending: Oncology

## 2024-07-08 ENCOUNTER — Inpatient Hospital Stay: Admitting: Internal Medicine

## 2024-07-08 ENCOUNTER — Inpatient Hospital Stay

## 2024-08-06 ENCOUNTER — Other Ambulatory Visit

## 2024-08-10 ENCOUNTER — Inpatient Hospital Stay

## 2024-08-13 ENCOUNTER — Inpatient Hospital Stay: Admitting: Internal Medicine
